# Patient Record
Sex: Female | Born: 1955
Health system: Southern US, Community
[De-identification: ages and names within clinical notes are randomized; demographics above are authoritative.]

## PROBLEM LIST (undated history)

## (undated) DIAGNOSIS — N289 Disorder of kidney and ureter, unspecified: Secondary | ICD-10-CM

## (undated) DIAGNOSIS — I428 Other cardiomyopathies: Secondary | ICD-10-CM

## (undated) DIAGNOSIS — I5022 Chronic systolic (congestive) heart failure: Secondary | ICD-10-CM

## (undated) DIAGNOSIS — D582 Other hemoglobinopathies: Secondary | ICD-10-CM

## (undated) DIAGNOSIS — J9600 Acute respiratory failure, unspecified whether with hypoxia or hypercapnia: Secondary | ICD-10-CM

## (undated) DIAGNOSIS — T148XXA Other injury of unspecified body region, initial encounter: Secondary | ICD-10-CM

## (undated) DIAGNOSIS — I10 Essential (primary) hypertension: Secondary | ICD-10-CM

## (undated) DIAGNOSIS — I340 Nonrheumatic mitral (valve) insufficiency: Secondary | ICD-10-CM

## (undated) DIAGNOSIS — K219 Gastro-esophageal reflux disease without esophagitis: Secondary | ICD-10-CM

## (undated) DIAGNOSIS — R7303 Prediabetes: Secondary | ICD-10-CM

## (undated) DIAGNOSIS — E785 Hyperlipidemia, unspecified: Secondary | ICD-10-CM

## (undated) DIAGNOSIS — I447 Left bundle-branch block, unspecified: Secondary | ICD-10-CM

## (undated) DIAGNOSIS — I071 Rheumatic tricuspid insufficiency: Secondary | ICD-10-CM

## (undated) HISTORY — DX: Other injury of unspecified body region, initial encounter: T14.8XXA

## (undated) HISTORY — DX: Other cardiomyopathies: I42.8

## (undated) HISTORY — DX: Other hemoglobinopathies: D58.2

## (undated) HISTORY — DX: Acute respiratory failure, unspecified whether with hypoxia or hypercapnia: J96.00

## (undated) HISTORY — DX: Disorder of kidney and ureter, unspecified: N28.9

## (undated) HISTORY — DX: Left bundle-branch block, unspecified: I44.7

## (undated) HISTORY — DX: Rheumatic tricuspid insufficiency: I07.1

## (undated) HISTORY — DX: Hyperlipidemia, unspecified: E78.5

## (undated) HISTORY — DX: Chronic systolic (congestive) heart failure: I50.22

## (undated) HISTORY — DX: Nonrheumatic mitral (valve) insufficiency: I34.0

## (undated) HISTORY — PX: TUBAL LIGATION: SHX77

---

## 2016-11-07 LAB — COLOGUARD: Cologuard: NEGATIVE

## 2018-10-19 DIAGNOSIS — K219 Gastro-esophageal reflux disease without esophagitis: Secondary | ICD-10-CM

## 2018-10-19 DIAGNOSIS — E66812 Obesity, class 2: Secondary | ICD-10-CM

## 2018-10-19 DIAGNOSIS — E785 Hyperlipidemia, unspecified: Secondary | ICD-10-CM | POA: Insufficient documentation

## 2018-10-19 HISTORY — DX: Gastro-esophageal reflux disease without esophagitis: K21.9

## 2018-10-19 HISTORY — DX: Obesity, class 2: E66.812

## 2018-10-19 HISTORY — DX: Morbid (severe) obesity due to excess calories: E66.01

## 2019-01-09 DIAGNOSIS — R140 Abdominal distension (gaseous): Secondary | ICD-10-CM

## 2019-01-09 DIAGNOSIS — R609 Edema, unspecified: Secondary | ICD-10-CM

## 2019-01-09 DIAGNOSIS — I10 Essential (primary) hypertension: Secondary | ICD-10-CM

## 2019-01-09 DIAGNOSIS — I351 Nonrheumatic aortic (valve) insufficiency: Secondary | ICD-10-CM

## 2019-01-09 DIAGNOSIS — E785 Hyperlipidemia, unspecified: Secondary | ICD-10-CM

## 2019-01-09 DIAGNOSIS — I361 Nonrheumatic tricuspid (valve) insufficiency: Secondary | ICD-10-CM

## 2019-01-09 DIAGNOSIS — I509 Heart failure, unspecified: Secondary | ICD-10-CM

## 2019-01-09 DIAGNOSIS — I340 Nonrheumatic mitral (valve) insufficiency: Secondary | ICD-10-CM

## 2019-01-10 DIAGNOSIS — I509 Heart failure, unspecified: Secondary | ICD-10-CM

## 2019-01-10 DIAGNOSIS — R140 Abdominal distension (gaseous): Secondary | ICD-10-CM | POA: Diagnosis not present

## 2019-01-10 DIAGNOSIS — E785 Hyperlipidemia, unspecified: Secondary | ICD-10-CM | POA: Diagnosis not present

## 2019-01-10 DIAGNOSIS — I10 Essential (primary) hypertension: Secondary | ICD-10-CM | POA: Diagnosis not present

## 2019-01-11 DIAGNOSIS — E785 Hyperlipidemia, unspecified: Secondary | ICD-10-CM | POA: Diagnosis not present

## 2019-01-11 DIAGNOSIS — I10 Essential (primary) hypertension: Secondary | ICD-10-CM | POA: Diagnosis not present

## 2019-01-11 DIAGNOSIS — I509 Heart failure, unspecified: Secondary | ICD-10-CM | POA: Diagnosis not present

## 2019-01-11 DIAGNOSIS — R140 Abdominal distension (gaseous): Secondary | ICD-10-CM | POA: Diagnosis not present

## 2019-01-12 ENCOUNTER — Inpatient Hospital Stay (HOSPITAL_COMMUNITY)
Admission: AD | Admit: 2019-01-12 | Discharge: 2019-01-16 | DRG: 286 | Disposition: A | Discharge status: Home / Self Care | Payer: BLUE CROSS/BLUE SHIELD | Attending: Internal Medicine | Admitting: Internal Medicine

## 2019-01-12 ENCOUNTER — Encounter (HOSPITAL_COMMUNITY): Payer: Self-pay | Admitting: Family Medicine

## 2019-01-12 DIAGNOSIS — I447 Left bundle-branch block, unspecified: Secondary | ICD-10-CM | POA: Diagnosis present

## 2019-01-12 DIAGNOSIS — E876 Hypokalemia: Secondary | ICD-10-CM | POA: Diagnosis present

## 2019-01-12 DIAGNOSIS — I110 Hypertensive heart disease with heart failure: Principal | ICD-10-CM | POA: Diagnosis present

## 2019-01-12 DIAGNOSIS — R7303 Prediabetes: Secondary | ICD-10-CM | POA: Diagnosis present

## 2019-01-12 DIAGNOSIS — I10 Essential (primary) hypertension: Secondary | ICD-10-CM

## 2019-01-12 DIAGNOSIS — I081 Rheumatic disorders of both mitral and tricuspid valves: Secondary | ICD-10-CM | POA: Diagnosis present

## 2019-01-12 DIAGNOSIS — E785 Hyperlipidemia, unspecified: Secondary | ICD-10-CM | POA: Diagnosis present

## 2019-01-12 DIAGNOSIS — I420 Dilated cardiomyopathy: Secondary | ICD-10-CM | POA: Diagnosis present

## 2019-01-12 DIAGNOSIS — I5021 Acute systolic (congestive) heart failure: Secondary | ICD-10-CM

## 2019-01-12 DIAGNOSIS — Z8249 Family history of ischemic heart disease and other diseases of the circulatory system: Secondary | ICD-10-CM

## 2019-01-12 DIAGNOSIS — Z1159 Encounter for screening for other viral diseases: Secondary | ICD-10-CM

## 2019-01-12 DIAGNOSIS — I509 Heart failure, unspecified: Secondary | ICD-10-CM | POA: Diagnosis not present

## 2019-01-12 DIAGNOSIS — K219 Gastro-esophageal reflux disease without esophagitis: Secondary | ICD-10-CM | POA: Diagnosis present

## 2019-01-12 DIAGNOSIS — N179 Acute kidney failure, unspecified: Secondary | ICD-10-CM | POA: Diagnosis present

## 2019-01-12 HISTORY — DX: Gastro-esophageal reflux disease without esophagitis: K21.9

## 2019-01-12 HISTORY — DX: Essential (primary) hypertension: I10

## 2019-01-12 HISTORY — DX: Acute systolic (congestive) heart failure: I50.21

## 2019-01-12 HISTORY — DX: Prediabetes: R73.03

## 2019-01-12 LAB — MRSA PCR SCREENING: MRSA by PCR: NEGATIVE

## 2019-01-12 LAB — GLUCOSE, CAPILLARY: Glucose-Capillary: 151 mg/dL — ABNORMAL HIGH (ref 70–99)

## 2019-01-12 MED ORDER — ATORVASTATIN CALCIUM 10 MG PO TABS
10.0000 mg | ORAL_TABLET | Freq: Every day | ORAL | Status: DC
  Administered 2019-01-13 – 2019-01-15 (×3): 10 mg via ORAL
  Filled 2019-01-12 (×4): qty 1

## 2019-01-12 MED ORDER — ENOXAPARIN SODIUM 40 MG/0.4ML ~~LOC~~ SOLN
40.0000 mg | SUBCUTANEOUS | Status: DC
  Administered 2019-01-13 – 2019-01-15 (×3): 40 mg via SUBCUTANEOUS
  Filled 2019-01-12 (×4): qty 0.4

## 2019-01-12 MED ORDER — ASPIRIN EC 81 MG PO TBEC
81.0000 mg | DELAYED_RELEASE_TABLET | Freq: Every day | ORAL | Status: DC
  Administered 2019-01-13: 81 mg via ORAL
  Filled 2019-01-12: qty 1

## 2019-01-12 MED ORDER — ONDANSETRON HCL 4 MG/2ML IJ SOLN
4.0000 mg | Freq: Four times a day (QID) | INTRAMUSCULAR | Status: DC | PRN
  Filled 2019-01-12: qty 2

## 2019-01-12 MED ORDER — CARVEDILOL 6.25 MG PO TABS
6.2500 mg | ORAL_TABLET | Freq: Two times a day (BID) | ORAL | Status: DC
  Administered 2019-01-13 – 2019-01-16 (×8): 6.25 mg via ORAL
  Filled 2019-01-12 (×9): qty 1

## 2019-01-12 MED ORDER — LOSARTAN POTASSIUM 25 MG PO TABS
25.0000 mg | ORAL_TABLET | Freq: Every day | ORAL | Status: DC
  Administered 2019-01-13 – 2019-01-16 (×4): 25 mg via ORAL
  Filled 2019-01-12 (×4): qty 1

## 2019-01-12 MED ORDER — CARVEDILOL 6.25 MG PO TABS: 6.2500 mg | ORAL_TABLET | Freq: Two times a day (BID) | ORAL | Status: DC

## 2019-01-12 MED ORDER — SODIUM CHLORIDE 0.9 % IV SOLN: 250.0000 mL | INTRAVENOUS | Status: DC | PRN

## 2019-01-12 MED ORDER — ACETAMINOPHEN 325 MG PO TABS
650.0000 mg | ORAL_TABLET | ORAL | Status: DC | PRN
  Administered 2019-01-13: 650 mg via ORAL
  Filled 2019-01-12: qty 2

## 2019-01-12 MED ORDER — FAMOTIDINE 20 MG PO TABS
40.0000 mg | ORAL_TABLET | Freq: Every day | ORAL | Status: DC
  Administered 2019-01-13 – 2019-01-16 (×4): 40 mg via ORAL
  Filled 2019-01-12 (×4): qty 2

## 2019-01-12 MED ORDER — SODIUM CHLORIDE 0.9% FLUSH
3.0000 mL | Freq: Two times a day (BID) | INTRAVENOUS | Status: DC
  Administered 2019-01-12 – 2019-01-13 (×2): 3 mL via INTRAVENOUS

## 2019-01-12 MED ORDER — FUROSEMIDE 10 MG/ML IJ SOLN
40.0000 mg | Freq: Two times a day (BID) | INTRAMUSCULAR | Status: DC
  Administered 2019-01-12 – 2019-01-15 (×6): 40 mg via INTRAVENOUS
  Filled 2019-01-12 (×6): qty 4

## 2019-01-12 MED ORDER — SODIUM CHLORIDE 0.9% FLUSH: 3.0000 mL | INTRAVENOUS | Status: DC | PRN

## 2019-01-12 NOTE — Progress Notes (Signed)
Pt arrived to unit. VS stable. MD paged. Awaiting orders. Will continue to monitor.

## 2019-01-12 NOTE — H&P (Signed)
History and Physical    Breanna Bennett ULA:[id number] DOB: 1956-05-08 DOA: 01/12/2019  PCP: Patient, No Pcp Per   Patient coming from: Home   Chief Complaint: SOB, swelling   HPI: Breanna Bennett is a 63 y.o. female with medical history significant for hypertension, GERD, hyperlipidemia, and prediabetes, who presented to the emergency department on 01/09/2019 with several months of progressive exertional dyspnea, orthopnea, bilateral lower extremity swelling, and abdominal distention.  Her shortness of breath has been much worse with any exertion.  She has had a mild nonproductive cough associated with this, but denies any fevers or chills.  Bilateral lower extremities have been swollen, but not tender.  She has not been having any chest pain.  [facility] ED and hospital course: Upon arrival to the ED, patient is found to be afebrile, saturating adequately on room air, and hypertensive.  Patient was admitted to the hospitalist service for new acute CHF, was seen by cardiology, underwent transthoracic echo that demonstrated EF 25 to 30% with severe LV dilation, and severe global hypokinesis.  She was continued on her ARB, started on Coreg, and diuresed with IV Lasix, net -3.2 L since admission.  Cardiologist at [facility] recommended transfer to [facility] for cardiac catheterization.  Review of Systems:  All other systems reviewed and apart from HPI, are negative.  Past Medical History:  Diagnosis Date  . GERD (gastroesophageal reflux disease)   . Hypertension   . Pre-diabetes     History reviewed. No pertinent surgical history.   reports that she has never smoked. She has never used smokeless tobacco. She reports that she does not drink alcohol or use drugs.  No Known Allergies  Family History  Problem Relation Age of Onset  . CAD Mother   . Heart failure Mother      Prior to Admission medications   Not on File    Physical Exam: Vitals:   01/12/19 2123  BP: (!)  130/102  Pulse: (!) 112  Resp: 15  Temp: 98.1 F (36.7 C)  TempSrc: Oral  SpO2: 97%  Weight: 106.8 kg  Height: 5\' 8"  (1.727 m)    Constitutional: NAD, calm  Eyes: PERTLA, lids and conjunctivae normal ENMT: Mucous membranes are moist. Posterior pharynx clear of any exudate or lesions.   Neck: normal, supple, no masses, no thyromegaly Respiratory: no wheezing, no crackles. No accessory muscle use.  Cardiovascular: Rate ~110 and regular. 1+ pretibial pitting edema bilaterally.   Abdomen: No distension, no tenderness, soft. Bowel sounds normal.  Musculoskeletal: no clubbing / cyanosis. No joint deformity upper and lower extremities.    Skin: no significant rashes, lesions, ulcers. Warm, dry, well-perfused. Neurologic: CN 2-12 grossly intact. Sensation intact. Strength 5/5 in all 4 limbs.  Psychiatric: Alert and oriented to person, place, and situation. Very pleasant, cooperative.    Labs on Admission: I have personally reviewed following labs and imaging studies  CBC: No results for input(s): WBC, NEUTROABS, HGB, HCT, MCV, PLT in the last 168 hours. Basic Metabolic Panel: No results for input(s): NA, K, CL, CO2, GLUCOSE, BUN, CREATININE, CALCIUM, MG, PHOS in the last 168 hours. GFR: CrCl cannot be calculated (No successful lab value found.). Liver Function Tests: No results for input(s): AST, ALT, ALKPHOS, BILITOT, PROT, ALBUMIN in the last 168 hours. No results for input(s): LIPASE, AMYLASE in the last 168 hours. No results for input(s): AMMONIA in the last 168 hours. Coagulation Profile: No results for input(s): INR, PROTIME in the last 168 hours. Cardiac Enzymes: No  results for input(s): CKTOTAL, CKMB, CKMBINDEX, TROPONINI in the last 168 hours. BNP (last 3 results) No results for input(s): PROBNP in the last 8760 hours. HbA1C: No results for input(s): HGBA1C in the last 72 hours. CBG: Recent Labs  Lab 01/12/19 2204  GLUCAP 151*   Lipid Profile: No results for  input(s): CHOL, HDL, LDLCALC, TRIG, CHOLHDL, LDLDIRECT in the last 72 hours. Thyroid Function Tests: No results for input(s): TSH, T4TOTAL, FREET4, T3FREE, THYROIDAB in the last 72 hours. Anemia Panel: No results for input(s): VITAMINB12, FOLATE, FERRITIN, TIBC, IRON, RETICCTPCT in the last 72 hours. Urine analysis: No results found for: COLORURINE, APPEARANCEUR, LABSPEC, PHURINE, GLUCOSEU, HGBUR, BILIRUBINUR, KETONESUR, PROTEINUR, UROBILINOGEN, NITRITE, LEUKOCYTESUR Sepsis Labs: @LABRCNTIP (procalcitonin:4,lacticidven:4) ) Recent Results (from the past 240 hour(s))  MRSA PCR Screening     Status: None   Collection Time: 01/12/19  9:26 PM  Result Value Ref Range Status   MRSA by PCR NEGATIVE NEGATIVE Final    Comment:        The GeneXpert MRSA Assay (FDA approved for NASAL specimens only), is one component of a comprehensive MRSA colonization surveillance program. It is not intended to diagnose MRSA infection nor to guide or monitor treatment for MRSA infections. Performed at [facility], [address] [place], [place] [address]      Radiological Exams on Admission: No results found.  EKG: Independently reviewed. Sinus tachycardia with fusion complexes and non-specific IVCD.   Assessment/Plan   1. Acute systolic CHF  - Presented 01/09/19 with months of progressive DOE, orthopnea, and b/l leg swelling, was found to have EF 25-30%, and has been undergoing diuresis, net -3.2 L at [facility]  - Patient reports near-resolution of her dyspnea and orthopnea with diuresis  - Continue to diurese with IV Lasix, continue Coreg and ARB, follow daily wt and I/O's, discuss with cardiology regarding recommendation for catheterization   2. Hypertension  - Continue Coreg, losartan, Lasix    3. Hyperlipidemia  - Continue Lipitor    PPE: Mask, face shield  DVT prophylaxis: Lovenox  Code Status: Full  Family Communication: Discussed with patient  Consults called: None   Admission status: Observation     Briscoe Deutscher, MD [facility] Pager [phone]  If 7PM-7AM, please contact night-coverage [email or web address] Password [facility]  01/12/2019, 10:54 PM

## 2019-01-13 ENCOUNTER — Encounter (HOSPITAL_COMMUNITY)
Admission: AD | Disposition: A | Discharge status: Home / Self Care | Payer: Self-pay | Source: Home / Self Care | Attending: Internal Medicine

## 2019-01-13 ENCOUNTER — Encounter (HOSPITAL_COMMUNITY): Payer: Self-pay

## 2019-01-13 ENCOUNTER — Other Ambulatory Visit: Payer: Self-pay

## 2019-01-13 DIAGNOSIS — I420 Dilated cardiomyopathy: Secondary | ICD-10-CM | POA: Diagnosis not present

## 2019-01-13 DIAGNOSIS — I5023 Acute on chronic systolic (congestive) heart failure: Secondary | ICD-10-CM | POA: Diagnosis not present

## 2019-01-13 DIAGNOSIS — I5021 Acute systolic (congestive) heart failure: Secondary | ICD-10-CM | POA: Diagnosis not present

## 2019-01-13 DIAGNOSIS — I10 Essential (primary) hypertension: Secondary | ICD-10-CM | POA: Diagnosis not present

## 2019-01-13 HISTORY — PX: RIGHT/LEFT HEART CATH AND CORONARY ANGIOGRAPHY: CATH118266

## 2019-01-13 LAB — GLUCOSE, CAPILLARY
Glucose-Capillary: 122 mg/dL — ABNORMAL HIGH (ref 70–99)
Glucose-Capillary: 114 mg/dL — ABNORMAL HIGH (ref 70–99)
Glucose-Capillary: 137 mg/dL — ABNORMAL HIGH (ref 70–99)
Glucose-Capillary: 132 mg/dL — ABNORMAL HIGH (ref 70–99)

## 2019-01-13 LAB — POCT I-STAT 7, (LYTES, BLD GAS, ICA,H+H)
Acid-Base Excess: 6 mmol/L — ABNORMAL HIGH (ref 0.0–2.0)
Acid-Base Excess: 8 mmol/L — ABNORMAL HIGH (ref 0.0–2.0)
Bicarbonate: 28.8 mmol/L — ABNORMAL HIGH (ref 20.0–28.0)
Bicarbonate: 29.3 mmol/L — ABNORMAL HIGH (ref 20.0–28.0)
Calcium, Ion: 1.07 mmol/L — ABNORMAL LOW (ref 1.15–1.40)
Calcium, Ion: 1.08 mmol/L — ABNORMAL LOW (ref 1.15–1.40)
HCT: 50 % — ABNORMAL HIGH (ref 36.0–46.0)
HCT: 51 % — ABNORMAL HIGH (ref 36.0–46.0)
Hemoglobin: 17 g/dL — ABNORMAL HIGH (ref 12.0–15.0)
Hemoglobin: 17.3 g/dL — ABNORMAL HIGH (ref 12.0–15.0)
O2 Saturation: 67 %
O2 Saturation: 99 %
Potassium: 3.3 mmol/L — ABNORMAL LOW (ref 3.5–5.1)
Potassium: 3.5 mmol/L (ref 3.5–5.1)
Sodium: 138 mmol/L (ref 135–145)
Sodium: 139 mmol/L (ref 135–145)
TCO2: 30 mmol/L (ref 22–32)
TCO2: 30 mmol/L (ref 22–32)
pCO2 arterial: 30.3 mmHg — ABNORMAL LOW (ref 32.0–48.0)
pCO2 arterial: 38.4 mmHg (ref 32.0–48.0)
pH, Arterial: 7.492 — ABNORMAL HIGH (ref 7.350–7.450)
pH, Arterial: 7.586 — ABNORMAL HIGH (ref 7.350–7.450)
pO2, Arterial: 105 mmHg (ref 83.0–108.0)
pO2, Arterial: 32 mmHg — CL (ref 83.0–108.0)

## 2019-01-13 LAB — POCT I-STAT EG7
Acid-Base Excess: 7 mmol/L — ABNORMAL HIGH (ref 0.0–2.0)
Bicarbonate: 30.7 mmol/L — ABNORMAL HIGH (ref 20.0–28.0)
Calcium, Ion: 1.17 mmol/L (ref 1.15–1.40)
HCT: 52 % — ABNORMAL HIGH (ref 36.0–46.0)
Hemoglobin: 17.7 g/dL — ABNORMAL HIGH (ref 12.0–15.0)
O2 Saturation: 65 %
Potassium: 3.5 mmol/L (ref 3.5–5.1)
Sodium: 140 mmol/L (ref 135–145)
TCO2: 32 mmol/L (ref 22–32)
pCO2, Ven: 39.5 mmHg — ABNORMAL LOW (ref 44.0–60.0)
pH, Ven: 7.499 — ABNORMAL HIGH (ref 7.250–7.430)
pO2, Ven: 31 mmHg — CL (ref 32.0–45.0)

## 2019-01-13 LAB — BASIC METABOLIC PANEL
Anion gap: 14 (ref 5–15)
BUN: 35 mg/dL — ABNORMAL HIGH (ref 8–23)
CO2: 27 mmol/L (ref 22–32)
Calcium: 9.4 mg/dL (ref 8.9–10.3)
Chloride: 97 mmol/L — ABNORMAL LOW (ref 98–111)
Creatinine, Ser: 1.39 mg/dL — ABNORMAL HIGH (ref 0.44–1.00)
GFR calc Af Amer: 47 mL/min — ABNORMAL LOW (ref >60)
GFR calc non Af Amer: 41 mL/min — ABNORMAL LOW (ref >60)
Glucose, Bld: 134 mg/dL — ABNORMAL HIGH (ref 70–99)
Potassium: 3.4 mmol/L — ABNORMAL LOW (ref 3.5–5.1)
Sodium: 138 mmol/L (ref 135–145)

## 2019-01-13 LAB — CBC WITH DIFFERENTIAL/PLATELET
Abs Immature Granulocytes: 0.04 10*3/uL (ref 0.00–0.07)
Basophils Absolute: 0.1 10*3/uL (ref 0.0–0.1)
Basophils Relative: 1 %
Eosinophils Absolute: 0.1 10*3/uL (ref 0.0–0.5)
Eosinophils Relative: 1 %
HCT: 51.9 % — ABNORMAL HIGH (ref 36.0–46.0)
Hemoglobin: 17.3 g/dL — ABNORMAL HIGH (ref 12.0–15.0)
Immature Granulocytes: 0 %
Lymphocytes Relative: 31 %
Lymphs Abs: 3.1 10*3/uL (ref 0.7–4.0)
MCH: 27.7 pg (ref 26.0–34.0)
MCHC: 33.3 g/dL (ref 30.0–36.0)
MCV: 83.2 fL (ref 80.0–100.0)
Monocytes Absolute: 0.9 10*3/uL (ref 0.1–1.0)
Monocytes Relative: 9 %
Neutro Abs: 5.7 10*3/uL (ref 1.7–7.7)
Neutrophils Relative %: 58 %
Platelets: 305 10*3/uL (ref 150–400)
RBC: 6.24 MIL/uL — ABNORMAL HIGH (ref 3.87–5.11)
RDW: 15.2 % (ref 11.5–15.5)
WBC: 9.9 10*3/uL (ref 4.0–10.5)
nRBC: 0 % (ref 0.0–0.2)

## 2019-01-13 LAB — COMPREHENSIVE METABOLIC PANEL
ALT: 66 U/L — ABNORMAL HIGH (ref 0–44)
AST: 41 U/L (ref 15–41)
Albumin: 3.7 g/dL (ref 3.5–5.0)
Alkaline Phosphatase: 77 U/L (ref 38–126)
Anion gap: 13 (ref 5–15)
BUN: 35 mg/dL — ABNORMAL HIGH (ref 8–23)
CO2: 28 mmol/L (ref 22–32)
Calcium: 9.6 mg/dL (ref 8.9–10.3)
Chloride: 98 mmol/L (ref 98–111)
Creatinine, Ser: 1.32 mg/dL — ABNORMAL HIGH (ref 0.44–1.00)
GFR calc Af Amer: 50 mL/min — ABNORMAL LOW (ref >60)
GFR calc non Af Amer: 43 mL/min — ABNORMAL LOW (ref >60)
Glucose, Bld: 123 mg/dL — ABNORMAL HIGH (ref 70–99)
Potassium: 3 mmol/L — ABNORMAL LOW (ref 3.5–5.1)
Sodium: 139 mmol/L (ref 135–145)
Total Bilirubin: 1.1 mg/dL (ref 0.3–1.2)
Total Protein: 7.9 g/dL (ref 6.5–8.1)

## 2019-01-13 LAB — HIV ANTIBODY (ROUTINE TESTING W REFLEX): HIV Screen 4th Generation wRfx: NONREACTIVE

## 2019-01-13 LAB — MAGNESIUM: Magnesium: 2 mg/dL (ref 1.7–2.4)

## 2019-01-13 LAB — TROPONIN I
Troponin I: 0.29 ng/mL (ref <0.03)
Troponin I: 0.36 ng/mL (ref <0.03)
Troponin I: 0.37 ng/mL (ref <0.03)

## 2019-01-13 LAB — SARS CORONAVIRUS 2 BY RT PCR (HOSPITAL ORDER, PERFORMED IN ~~LOC~~ HOSPITAL LAB): SARS Coronavirus 2: NEGATIVE

## 2019-01-13 SURGERY — RIGHT/LEFT HEART CATH AND CORONARY ANGIOGRAPHY
Anesthesia: LOCAL

## 2019-01-13 MED ORDER — SODIUM CHLORIDE 0.9 % IV SOLN
INTRAVENOUS | Status: DC
  Administered 2019-01-13: 13:00:00 via INTRAVENOUS

## 2019-01-13 MED ORDER — FENTANYL CITRATE (PF) 100 MCG/2ML IJ SOLN
INTRAMUSCULAR | Status: DC | PRN
  Administered 2019-01-13: 25 ug via INTRAVENOUS

## 2019-01-13 MED ORDER — ASPIRIN 81 MG PO CHEW
81.0000 mg | CHEWABLE_TABLET | Freq: Every day | ORAL | Status: DC
  Administered 2019-01-14 – 2019-01-16 (×3): 81 mg via ORAL
  Filled 2019-01-13 (×3): qty 1

## 2019-01-13 MED ORDER — GUAIFENESIN-DM 100-10 MG/5ML PO SYRP
15.0000 mL | ORAL_SOLUTION | ORAL | Status: DC | PRN
  Administered 2019-01-13 – 2019-01-14 (×2): 15 mL via ORAL
  Filled 2019-01-13 (×2): qty 15

## 2019-01-13 MED ORDER — LIDOCAINE HCL (PF) 1 % IJ SOLN
INTRAMUSCULAR | Status: DC | PRN
  Administered 2019-01-13 (×2): 2 mL

## 2019-01-13 MED ORDER — SODIUM CHLORIDE 0.9 % IV SOLN
INTRAVENOUS | Status: AC
  Administered 2019-01-13: 16:00:00 via INTRAVENOUS

## 2019-01-13 MED ORDER — POTASSIUM CHLORIDE CRYS ER 20 MEQ PO TBCR
40.0000 meq | EXTENDED_RELEASE_TABLET | Freq: Once | ORAL | Status: AC
  Administered 2019-01-13: 16:00:00 40 meq via ORAL
  Filled 2019-01-13: qty 2

## 2019-01-13 MED ORDER — ONDANSETRON HCL 4 MG/2ML IJ SOLN: 4.0000 mg | Freq: Four times a day (QID) | INTRAMUSCULAR | Status: DC | PRN

## 2019-01-13 MED ORDER — HEPARIN (PORCINE) IN NACL 1000-0.9 UT/500ML-% IV SOLN
INTRAVENOUS | Status: AC
  Filled 2019-01-13: qty 1000

## 2019-01-13 MED ORDER — HEPARIN SODIUM (PORCINE) 1000 UNIT/ML IJ SOLN
INTRAMUSCULAR | Status: AC
  Filled 2019-01-13: qty 1

## 2019-01-13 MED ORDER — LIDOCAINE HCL (PF) 1 % IJ SOLN
INTRAMUSCULAR | Status: AC
  Filled 2019-01-13: qty 30

## 2019-01-13 MED ORDER — LABETALOL HCL 5 MG/ML IV SOLN: 10.0000 mg | INTRAVENOUS | Status: AC | PRN

## 2019-01-13 MED ORDER — HEPARIN (PORCINE) IN NACL 1000-0.9 UT/500ML-% IV SOLN
INTRAVENOUS | Status: DC | PRN
  Administered 2019-01-13 (×2): 500 mL

## 2019-01-13 MED ORDER — ACETAMINOPHEN 325 MG PO TABS
650.0000 mg | ORAL_TABLET | ORAL | Status: DC | PRN
  Administered 2019-01-13 – 2019-01-14 (×2): 650 mg via ORAL
  Filled 2019-01-13 (×2): qty 2

## 2019-01-13 MED ORDER — SODIUM CHLORIDE 0.9% FLUSH: 3.0000 mL | INTRAVENOUS | Status: DC | PRN

## 2019-01-13 MED ORDER — VERAPAMIL HCL 2.5 MG/ML IV SOLN
INTRAVENOUS | Status: AC
  Filled 2019-01-13: qty 2

## 2019-01-13 MED ORDER — FENTANYL CITRATE (PF) 100 MCG/2ML IJ SOLN
INTRAMUSCULAR | Status: AC
  Filled 2019-01-13: qty 2

## 2019-01-13 MED ORDER — SODIUM CHLORIDE 0.9 % IV SOLN: 250.0000 mL | INTRAVENOUS | Status: DC | PRN

## 2019-01-13 MED ORDER — HYDRALAZINE HCL 20 MG/ML IJ SOLN: 10.0000 mg | INTRAMUSCULAR | Status: AC | PRN

## 2019-01-13 MED ORDER — IOHEXOL 350 MG/ML SOLN
INTRAVENOUS | Status: DC | PRN
  Administered 2019-01-13: 35 mL

## 2019-01-13 MED ORDER — HEPARIN SODIUM (PORCINE) 1000 UNIT/ML IJ SOLN
INTRAMUSCULAR | Status: DC | PRN
  Administered 2019-01-13: 5000 [IU] via INTRAVENOUS

## 2019-01-13 MED ORDER — VERAPAMIL HCL 2.5 MG/ML IV SOLN
INTRAVENOUS | Status: DC | PRN
  Administered 2019-01-13: 10 mL via INTRA_ARTERIAL

## 2019-01-13 MED ORDER — SODIUM CHLORIDE 0.9% FLUSH
3.0000 mL | Freq: Two times a day (BID) | INTRAVENOUS | Status: DC
  Administered 2019-01-13 – 2019-01-15 (×4): 3 mL via INTRAVENOUS

## 2019-01-13 MED ORDER — MORPHINE SULFATE (PF) 2 MG/ML IV SOLN: 2.0000 mg | INTRAVENOUS | Status: DC | PRN

## 2019-01-13 MED ORDER — POTASSIUM CHLORIDE CRYS ER 20 MEQ PO TBCR
40.0000 meq | EXTENDED_RELEASE_TABLET | Freq: Once | ORAL | Status: AC
  Administered 2019-01-13: 40 meq via ORAL
  Filled 2019-01-13: qty 2

## 2019-01-13 SURGICAL SUPPLY — 12 items
CATH BALLN WEDGE 5F 110CM (CATHETERS) ×2 IMPLANT
CATH OPTITORQUE TIG 4.0 5F (CATHETERS) ×2 IMPLANT
GLIDESHEATH SLEND A-KIT 6F 22G (SHEATH) ×2 IMPLANT
GUIDEWIRE .025 260CM (WIRE) ×2 IMPLANT
GUIDEWIRE INQWIRE 1.5J.035X260 (WIRE) ×1 IMPLANT
INQWIRE 1.5J .035X260CM (WIRE) ×2
KIT HEART LEFT (KITS) ×2 IMPLANT
PACK CARDIAC CATHETERIZATION (CUSTOM PROCEDURE TRAY) ×2 IMPLANT
SHEATH GLIDE SLENDER 4/5FR (SHEATH) ×2 IMPLANT
TRANSDUCER W/STOPCOCK (MISCELLANEOUS) ×2 IMPLANT
TUBING CIL FLEX 10 FLL-RA (TUBING) ×2 IMPLANT
WIRE HI TORQ VERSACORE-J 145CM (WIRE) ×2 IMPLANT

## 2019-01-13 NOTE — Interval H&P Note (Signed)
Cath Lab Visit (complete for each Cath Lab visit)  Clinical Evaluation Leading to the Procedure:   ACS: No.  Non-ACS:    Anginal Classification: CCS I  Anti-ischemic medical therapy: No Therapy  Non-Invasive Test Results: No non-invasive testing performed  Prior CABG: No previous CABG      History and Physical Interval Note:  01/13/2019 2:12 PM  Breanna Bennett  has presented today for surgery, with the diagnosis of chest pain.  The various methods of treatment have been discussed with the patient and family. After consideration of risks, benefits and other options for treatment, the patient has consented to  Procedure(s): RIGHT/LEFT HEART CATH AND CORONARY ANGIOGRAPHY (N/A) as a surgical intervention.  The patient's history has been reviewed, patient examined, no change in status, stable for surgery.  I have reviewed the patient's chart and labs.  Questions were answered to the patient's satisfaction.     Nanetta Batty

## 2019-01-13 NOTE — Progress Notes (Addendum)
Pt ambulated to the bathroom with assistance . On return c/o dizziness, vital signs done , Hr 103,. Tele monitor showed Hr 103 possible sinus tachycardia , Dr Blake Divine informed, Ekg done and assessed, for cardio consult. Awaiting same.  Pt now in sinus rhythmn. GL87  .Care continues.

## 2019-01-13 NOTE — H&P (View-Only) (Signed)
Cardiology Consultation:   Patient ID: Breanna Bennett MRN: [id number]; DOB: Jan 30, 1956  Admit date: 01/12/2019 Date of Consult: 01/13/2019  Primary Care Provider: Patient, No Pcp Per Primary Cardiologist: No primary care provider on file.  Primary Electrophysiologist:  None    Patient Profile:   Breanna Bennett is a 63 y.o. female with a hx of HTN, GERD, HL and prediabetes who is being seen today for the evaluation of HF at the request of Dr. Blake Divine.  History of Present Illness:   Breanna Bennett is a 63 yo female with PMH noted above. Denies having been seen by a cardiologist in the past. Does have family hx of CAD with mother and sister having MI and CHF before the age of 33. Reported feeling unwell over the past couple of weeks. Developed abd bloating, shortness of breath, and lower extremity edema. Had also been experiencing exertional dyspnea for the past several months. 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 Also reporting orthopnea and has been sitting up in a chair at night to sleep.   Presented to [facility] on 01/09/19 with symptoms. Labs on admission were notable for proBNP >6000, elevated LFTs. CT abd in the ED with cardiomegaly and vascular congestion. She was admitted to [facility] for further management. EKG on admission showed SR with LBBB. COVID negative on admission. She was placed on IV lasix, with home medications of ASA, statin and cozaar continued. Echo there on 01/10/19 showed EF of 25-30% with severely dilated LV, moderate to severe MR, and moderate to severe TR. Cardiology at [facility] consulted and recommended transfer to [facility] for cardiac cath. She was placed on coreg and titrated to 6.25mg  BID prior to transfer. Of note troponin at [facility] with low flat trend of 0.02. LFTs were improving at the time of transfer.   At present her labs show mild hypokalemia, but other lytes stable, Cr 1.39  (slight above PTA Cr at [facility] was 1.1), Hgb 17.3, Trop 0.29. EKG noted SR with LBBB.   Past Medical History:  Diagnosis Date  . GERD (gastroesophageal reflux disease)   . Hypertension   . Pre-diabetes     History reviewed. No pertinent surgical history.   Home Medications:  Prior to Admission medications   Not on File    Inpatient Medications: Scheduled Meds: . aspirin EC  81 mg Oral Daily  . atorvastatin  10 mg Oral q1800  . carvedilol  6.25 mg Oral BID WC  . enoxaparin (LOVENOX) injection  40 mg Subcutaneous Q24H  . famotidine  40 mg Oral Daily  . furosemide  40 mg Intravenous Q12H  . losartan  25 mg Oral Daily  . sodium chloride flush  3 mL Intravenous Q12H   Continuous Infusions: . sodium chloride     PRN Meds: sodium chloride, acetaminophen, ondansetron (ZOFRAN) IV, sodium chloride flush  Allergies:   No Known Allergies  Social History:   Social History   Socioeconomic History  . Marital status: Unknown    Spouse name: Not on file  . Number of children: Not on file  . Years of education: Not on file  . Highest education level: Not on file  Occupational History  . Not on file  Social Needs  . Financial resource strain: Not on file  . Food insecurity:    Worry: Not on file    Inability: Not on file  . Transportation needs:    Medical:  Not on file    Non-medical: Not on file  Tobacco Use  . Smoking status: Never Smoker  . Smokeless tobacco: Never Used  Substance and Sexual Activity  . Alcohol use: Never    Frequency: Never  . Drug use: Never  . Sexual activity: Not on file  Lifestyle  . Physical activity:    Days per week: Not on file    Minutes per session: Not on file  . Stress: Not on file  Relationships  . Social connections:    Talks on phone: Not on file    Gets together: Not on file    Attends religious service: Not on file    Active member of club or organization: Not on file    Attends meetings of clubs or organizations: Not on  file    Relationship status: Not on file  . Intimate partner violence:    Fear of current or ex partner: Not on file    Emotionally abused: Not on file    Physically abused: Not on file    Forced sexual activity: Not on file  Other Topics Concern  . Not on file  Social History Narrative  . Not on file    Family History:    Family History  Problem Relation Age of Onset  . CAD Mother   . Heart failure Mother      ROS:  Please see the history of present illness.   All other ROS reviewed and negative.     Physical Exam/Data:   Vitals:   01/13/19 0400 01/13/19 0500 01/13/19 0656 01/13/19 0724  BP: 103/78  97/77 100/70  Pulse: 87  100 81  Resp: (!) 21   18  Temp: 98.1 F (36.7 C)   (!) 97.5 F (36.4 C)  TempSrc: Oral   Oral  SpO2: 99%   99%  Weight:  106.2 kg    Height:        Intake/Output Summary (Last 24 hours) at 01/13/2019 1022 Last data filed at 01/13/2019 0524 Gross per 24 hour  Intake 243 ml  Output 400 ml  Net -157 ml   Last 3 Weights 01/13/2019 01/12/2019  Weight (lbs) 234 lb 2.1 oz 235 lb 7.2 oz  Weight (kg) 106.2 kg 106.8 kg     Body mass index is 35.6 kg/m.  General:  Well nourished, well developed, in no acute distress HEENT: normal Lymph: no adenopathy Neck: no JVD Endocrine:  No thryomegaly Vascular: No carotid bruits; FA pulses 2+ bilaterally without bruits  Cardiac:  normal S1, S2; RRR; no murmur Lungs:  clear to auscultation bilaterally, no wheezing, rhonchi or rales  Abd: soft, nontender, no hepatomegaly  Ext: no edema Musculoskeletal:  No deformities, BUE and BLE strength normal and equal Skin: warm and dry  Neuro:  CNs 2-12 intact, no focal abnormalities noted Psych:  Normal affect   EKG:  The EKG was personally reviewed and demonstrates:  SR with LBBB Telemetry: Reviewed and demonstrates: appears to have had bursts of afib/aflutter this morning around 9am, now in SR.   Relevant CV Studies:  TTE (in paper chart) EF 25%   Laboratory Data:  Chemistry Recent Labs  Lab 01/13/19 0238 01/13/19 0919  NA 139 138  K 3.0* 3.4*  CL 98 97*  CO2 28 27  GLUCOSE 123* 134*  BUN 35* 35*  CREATININE 1.32* 1.39*  CALCIUM 9.6 9.4  GFRNONAA 43* 41*  GFRAA 50* 47*  ANIONGAP 13 14    Recent Labs  Lab 01/13/19 0238  PROT 7.9  ALBUMIN 3.7  AST 41  ALT 66*  ALKPHOS 77  BILITOT 1.1   Hematology Recent Labs  Lab 01/13/19 0238  WBC 9.9  RBC 6.24*  HGB 17.3*  HCT 51.9*  MCV 83.2  MCH 27.7  MCHC 33.3  RDW 15.2  PLT 305   Cardiac Enzymes Recent Labs  Lab 01/13/19 0919  TROPONINI 0.29*   No results for input(s): TROPIPOC in the last 168 hours.  BNPNo results for input(s): BNP, PROBNP in the last 168 hours.  DDimer No results for input(s): DDIMER in the last 168 hours.  Radiology/Studies:  No results found.  Assessment and Plan:   Breanna Bennett is a 63 y.o. female with a hx of HTN, GERD, HL and prediabetes who is being seen today for the evaluation of HF at the request of Dr. Blake Divine.  1. New onset systolic HF: no reported chest pain prior to admission, though does has CRFs of HTN, HL and prediabetic. Also with + FH in mother and sister. EF at [facility] 25% with global hypokinesis. Troponin 0.02 while at [place], up to 0.29 here. Will ultimately need R/LHC to assess for underlying CAD as etiology of HF. Review timing with MD. -- medical therapy includes coreg, and ARB. Consider transition to [facility] post cath.   2. HTN: stable with current therapy  3. Possible aflutter/fib: run noted on telemetry and seemed symptomatic at the time while walking to the bathroom. Was dizzy with episode. Currently in SR with LBBB.   4. HL: on low dose on statin. Will further increase to high dose pending cath results.   5. Hypokalemia: supplement per primary  For questions or updates, please contact [facility] Please consult [email or web address] for contact info under   Signed, Laverda Page, NP  01/13/2019  10:22 AM   I have personally seen and examined this patient with Laverda Page, NP. I agree with the assessment and plan as outlined above. Breanna Bennett has a h/o HTN and HLD. Admitted to [facility] with c/o abdominal swelling and LE edema with mild dyspnea and found to be volume overloaded. Diuresed there and now feeling better. Echo at [facility] with LVEF=25%. She is on Coreg and ARB. No chest pain.  Labs reviewed by me.  Troponin 0.29.  Creat 1.39.  EKG with sinus and LBBB Tele with possible atrial flutter My exam: General: Well developed, well nourished, NAD  HEENT: OP clear, mucus membranes moist  SKIN: warm, dry. No rashes. Neuro: No focal deficits  Musculoskeletal: Muscle strength 5/5 all ext  Psychiatric: Mood and affect normal  Neck: No JVD, no carotid bruits, no thyromegaly, no lymphadenopathy.  Lungs:Clear bilaterally, no wheezes, rhonci, crackles Cardiovascular: Regular rate and rhythm. No murmurs, gallops or rubs. Abdomen:Soft. Bowel sounds present. Non-tender.  Extremities: No lower extremity edema. Pulses are 2 + in the bilateral DP/PT. Plan:  1. Cardiomyopathy 2. Elevated troponin 3. Presumed LBBB 4. Possible atrial flutter  Plan right and left heart cath today. Following cath will titrate medical therapy. Follow on telemetry and if more atrial flutter, will have to consider long term anticoagulation.   Verne Carrow 01/13/2019 12:21 PM

## 2019-01-13 NOTE — Progress Notes (Signed)
CRITICAL VALUE ALERT  Critical Value:  tROP-0.29   Date & Time Notied: 5/21/ 20 @11 :17  Provider Notified:  Dr Blake Divine  Orders Received/Actions taken: none  Awaiting cardio review.

## 2019-01-13 NOTE — Progress Notes (Signed)
PROGRESS NOTE    Kaianna Nagengast  XTA:[id number] DOB: 05/07/1956 DOA: 01/12/2019 PCP: Patient, No Pcp Per   Brief Narrative:   Rhaelyn Hambleton is a 63 y.o. female with medical history significant for hypertension, GERD, hyperlipidemia, and prediabetes, who presented to the emergency department on 01/09/2019 with several months of progressive exertional dyspnea, orthopnea, bilateral lower extremity swelling, and abdominal distention. She was admitted for CHF at [facility] hospital and transferred to [facility] for cardiac cath.   She was seen by cardiology, underwent transthoracic echo that demonstrated EF 25 to 30% with severe LV dilation, and severe global hypokinesis.   Cardiologist at [facility] recommended transfer to [facility] for cardiac catheterization.  Assessment & Plan:   Principal Problem:   Acute systolic CHF (congestive heart failure) (HCC) Active Problems:   Essential hypertension   Acute systolic heart failure  _ diuresed about 5 liters since admission asp er the patient.  - continue with IV lasix, coreg and cardiology consulted for cath today.  Further recommendations as per cardiology.    Essential hypertension:  Well controlled.    Elevated troponin's; Probably from demand ischemia.  Pt denies any chest pain.    ? Atrial flutter:  ? New LBBB Will need EP evaluation in the future.  Currently in SR on coreg.     Hyokalemia: Replaced.      DVT prophylaxis: lovenox.  Code Status:full code.  Family Communication: none at bedside.  Disposition Plan: pending further work up by cardiology.    Consultants:   Cardiology.    Procedures: cardiac catheterization.   Antimicrobials: none.   Subjective: Some dizziness earlier today .  No chest pain , sob improved.   Objective: Vitals:   01/13/19 0656 01/13/19 0724 01/13/19 0850 01/13/19 1232  BP: 97/77 100/70 (!) 106/47 100/83  Pulse: 100 81 (!) 103   Resp:  18    Temp:  (!) 97.5 F (36.4 C)  97.8 F  (36.6 C)  TempSrc:  Oral  Oral  SpO2:  99%    Weight:      Height:        Intake/Output Summary (Last 24 hours) at 01/13/2019 1313 Last data filed at 01/13/2019 1306 Gross per 24 hour  Intake 243 ml  Output 701 ml  Net -458 ml   Filed Weights   01/12/19 2123 01/13/19 0500  Weight: 106.8 kg 106.2 kg    Examination:  General exam: Appears calm and comfortable  Respiratory system: Clear to auscultation. Respiratory effort normal. Cardiovascular system: S1 & S2 heard, RRR. Mild pedal edema. Gastrointestinal system: Abdomen is nondistended, soft and nontender. No organomegaly or masses felt. Normal bowel sounds heard. Central nervous system: Alert and oriented. No focal neurological deficits. Extremities: Symmetric 5 x 5 power. Skin: No rashes, lesions or ulcers Psychiatry: Judgement and insight appear normal. Mood & affect appropriate.     Data Reviewed: I have personally reviewed following labs and imaging studies  CBC: Recent Labs  Lab 01/13/19 0238  WBC 9.9  NEUTROABS 5.7  HGB 17.3*  HCT 51.9*  MCV 83.2  PLT 305   Basic Metabolic Panel: Recent Labs  Lab 01/13/19 0238 01/13/19 0919  NA 139 138  K 3.0* 3.4*  CL 98 97*  CO2 28 27  GLUCOSE 123* 134*  BUN 35* 35*  CREATININE 1.32* 1.39*  CALCIUM 9.6 9.4  MG 2.0  --    GFR: Estimated Creatinine Clearance: 53.5 mL/min (A) (by C-G formula based on SCr of 1.39 mg/dL (H)). Liver Function  Tests: Recent Labs  Lab 01/13/19 0238  AST 41  ALT 66*  ALKPHOS 77  BILITOT 1.1  PROT 7.9  ALBUMIN 3.7   No results for input(s): LIPASE, AMYLASE in the last 168 hours. No results for input(s): AMMONIA in the last 168 hours. Coagulation Profile: No results for input(s): INR, PROTIME in the last 168 hours. Cardiac Enzymes: Recent Labs  Lab 01/13/19 0919  TROPONINI 0.29*   BNP (last 3 results) No results for input(s): PROBNP in the last 8760 hours. HbA1C: No results for input(s): HGBA1C in the last 72 hours.  CBG: Recent Labs  Lab 01/12/19 2204 01/13/19 0728 01/13/19 1106  GLUCAP 151* 122* 114*   Lipid Profile: No results for input(s): CHOL, HDL, LDLCALC, TRIG, CHOLHDL, LDLDIRECT in the last 72 hours. Thyroid Function Tests: No results for input(s): TSH, T4TOTAL, FREET4, T3FREE, THYROIDAB in the last 72 hours. Anemia Panel: No results for input(s): VITAMINB12, FOLATE, FERRITIN, TIBC, IRON, RETICCTPCT in the last 72 hours. Sepsis Labs: No results for input(s): PROCALCITON, LATICACIDVEN in the last 168 hours.  Recent Results (from the past 240 hour(s))  MRSA PCR Screening     Status: None   Collection Time: 01/12/19  9:26 PM  Result Value Ref Range Status   MRSA by PCR NEGATIVE NEGATIVE Final    Comment:        The GeneXpert MRSA Assay (FDA approved for NASAL specimens only), is one component of a comprehensive MRSA colonization surveillance program. It is not intended to diagnose MRSA infection nor to guide or monitor treatment for MRSA infections. Performed at [facility], [address] [place], [place] [address]   SARS Coronavirus 2 (CEPHEID - Performed in [facility] hospital lab), Hosp Order     Status: None   Collection Time: 01/12/19 11:37 PM  Result Value Ref Range Status   SARS Coronavirus 2 NEGATIVE NEGATIVE Final    Comment: (NOTE) If result is NEGATIVE SARS-CoV-2 target nucleic acids are NOT DETECTED. The SARS-CoV-2 RNA is generally detectable in upper and lower  respiratory specimens during the acute phase of infection. The lowest  concentration of SARS-CoV-2 viral copies this assay can detect is 250  copies / mL. A negative result does not preclude SARS-CoV-2 infection  and should not be used as the sole basis for treatment or other  patient management decisions.  A negative result may occur with  improper specimen collection / handling, submission of specimen other  than nasopharyngeal swab, presence of viral mutation(s) within the  areas targeted by  this assay, and inadequate number of viral copies  (<250 copies / mL). A negative result must be combined with clinical  observations, patient history, and epidemiological information. If result is POSITIVE SARS-CoV-2 target nucleic acids are DETECTED. The SARS-CoV-2 RNA is generally detectable in upper and lower  respiratory specimens dur ing the acute phase of infection.  Positive  results are indicative of active infection with SARS-CoV-2.  Clinical  correlation with patient history and other diagnostic information is  necessary to determine patient infection status.  Positive results do  not rule out bacterial infection or co-infection with other viruses. If result is PRESUMPTIVE POSTIVE SARS-CoV-2 nucleic acids MAY BE PRESENT.   A presumptive positive result was obtained on the submitted specimen  and confirmed on repeat testing.  While 2019 novel coronavirus  (SARS-CoV-2) nucleic acids may be present in the submitted sample  additional confirmatory testing may be necessary for epidemiological  and / or clinical management purposes  to differentiate between  SARS-CoV-2 and other Sarbecovirus currently known to infect humans.  If clinically indicated additional testing with an alternate test  methodology [phone]) is advised. The SARS-CoV-2 RNA is generally  detectable in upper and lower respiratory sp ecimens during the acute  phase of infection. The expected result is Negative. Fact Sheet for Patients:  BoilerBrush.com.cy Fact Sheet for Healthcare Providers: https://pope.com/ This test is not yet approved or cleared by the [place] FDA and has been authorized for detection and/or diagnosis of SARS-CoV-2 by FDA under an Emergency Use Authorization (EUA).  This EUA will remain in effect (meaning this test can be used) for the duration of the COVID-19 declaration under Section 564(b)(1) of the Act, 21 U.S.C. section  360bbb-3(b)(1), unless the authorization is terminated or revoked sooner. Performed at [facility], [address], [place], [place] [address]          Radiology Studies: No results found.      Scheduled Meds: . aspirin EC  81 mg Oral Daily  . atorvastatin  10 mg Oral q1800  . carvedilol  6.25 mg Oral BID WC  . enoxaparin (LOVENOX) injection  40 mg Subcutaneous Q24H  . famotidine  40 mg Oral Daily  . furosemide  40 mg Intravenous Q12H  . losartan  25 mg Oral Daily  . sodium chloride flush  3 mL Intravenous Q12H   Continuous Infusions: . sodium chloride    . sodium chloride       LOS: 1 day    Time spent: 37 minutes.     Kathlen Mody, MD [facility] Pager [phone]  If 7PM-7AM, please contact night-coverage [email or web address] Password [email or web address] 01/13/2019, 1:13 PM

## 2019-01-13 NOTE — Consult Note (Signed)
Cardiology Consultation:   Patient ID: Breanna Bennett MRN: [id number]; DOB: 03/29/1956  Admit date: 01/12/2019 Date of Consult: 01/13/2019  Primary Care Provider: Patient, No Pcp Per Primary Cardiologist: No primary care provider on file.  Primary Electrophysiologist:  None    Patient Profile:   Breanna Bennett is a 62 y.o. female with a hx of HTN, GERD, HL and prediabetes who is being seen today for the evaluation of HF at the request of Dr. Akula.  History of Present Illness:   Breanna Bennett is a 62 yo female with PMH noted above. Denies having been seen by a cardiologist in the past. Does have family hx of CAD with mother and sister having MI and CHF before the age of 60. Reported feeling unwell over the past couple of weeks. Developed abd bloating, shortness of breath, and lower extremity edema. Had also been experiencing exertional dyspnea for the past several months. 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 Also reporting orthopnea and has been sitting up in a chair at night to sleep.   Presented to [place] hospital on 01/09/19 with symptoms. Labs on admission were notable for proBNP >6000, elevated LFTs. CT abd in the ED with cardiomegaly and vascular congestion. She was admitted to [facility] for further management. EKG on admission showed SR with LBBB. COVID negative on admission. She was placed on IV lasix, with home medications of ASA, statin and cozaar continued. Echo there on 01/10/19 showed EF of 25-30% with severely dilated LV, moderate to severe MR, and moderate to severe TR. Cardiology at [place] consulted and recommended transfer to [facility] for cardiac cath. She was placed on coreg and titrated to 6.25mg BID prior to transfer. Of note troponin at [place] with low flat trend of 0.02. LFTs were improving at the time of transfer.   At present her labs show mild hypokalemia, but other lytes stable, Cr 1.39  (slight above PTA Cr at [place] was 1.1), Hgb 17.3, Trop 0.29. EKG noted SR with LBBB.   Past Medical History:  Diagnosis Date  . GERD (gastroesophageal reflux disease)   . Hypertension   . Pre-diabetes     History reviewed. No pertinent surgical history.   Home Medications:  Prior to Admission medications   Not on File    Inpatient Medications: Scheduled Meds: . aspirin EC  81 mg Oral Daily  . atorvastatin  10 mg Oral q1800  . carvedilol  6.25 mg Oral BID WC  . enoxaparin (LOVENOX) injection  40 mg Subcutaneous Q24H  . famotidine  40 mg Oral Daily  . furosemide  40 mg Intravenous Q12H  . losartan  25 mg Oral Daily  . sodium chloride flush  3 mL Intravenous Q12H   Continuous Infusions: . sodium chloride     PRN Meds: sodium chloride, acetaminophen, ondansetron (ZOFRAN) IV, sodium chloride flush  Allergies:   No Known Allergies  Social History:   Social History   Socioeconomic History  . Marital status: Unknown    Spouse name: Not on file  . Number of children: Not on file  . Years of education: Not on file  . Highest education level: Not on file  Occupational History  . Not on file  Social Needs  . Financial resource strain: Not on file  . Food insecurity:    Worry: Not on file    Inability: Not on file  . Transportation needs:    Medical:   Not on file    Non-medical: Not on file  Tobacco Use  . Smoking status: Never Smoker  . Smokeless tobacco: Never Used  Substance and Sexual Activity  . Alcohol use: Never    Frequency: Never  . Drug use: Never  . Sexual activity: Not on file  Lifestyle  . Physical activity:    Days per week: Not on file    Minutes per session: Not on file  . Stress: Not on file  Relationships  . Social connections:    Talks on phone: Not on file    Gets together: Not on file    Attends religious service: Not on file    Active member of club or organization: Not on file    Attends meetings of clubs or organizations: Not on  file    Relationship status: Not on file  . Intimate partner violence:    Fear of current or ex partner: Not on file    Emotionally abused: Not on file    Physically abused: Not on file    Forced sexual activity: Not on file  Other Topics Concern  . Not on file  Social History Narrative  . Not on file    Family History:    Family History  Problem Relation Age of Onset  . CAD Mother   . Heart failure Mother      ROS:  Please see the history of present illness.   All other ROS reviewed and negative.     Physical Exam/Data:   Vitals:   01/13/19 0400 01/13/19 0500 01/13/19 0656 01/13/19 0724  BP: 103/78  97/77 100/70  Pulse: 87  100 81  Resp: (!) 21   18  Temp: 98.1 F (36.7 C)   (!) 97.5 F (36.4 C)  TempSrc: Oral   Oral  SpO2: 99%   99%  Weight:  106.2 kg    Height:        Intake/Output Summary (Last 24 hours) at 01/13/2019 1022 Last data filed at 01/13/2019 0524 Gross per 24 hour  Intake 243 ml  Output 400 ml  Net -157 ml   Last 3 Weights 01/13/2019 01/12/2019  Weight (lbs) 234 lb 2.1 oz 235 lb 7.2 oz  Weight (kg) 106.2 kg 106.8 kg     Body mass index is 35.6 kg/m.  General:  Well nourished, well developed, in no acute distress HEENT: normal Lymph: no adenopathy Neck: no JVD Endocrine:  No thryomegaly Vascular: No carotid bruits; FA pulses 2+ bilaterally without bruits  Cardiac:  normal S1, S2; RRR; no murmur Lungs:  clear to auscultation bilaterally, no wheezing, rhonchi or rales  Abd: soft, nontender, no hepatomegaly  Ext: no edema Musculoskeletal:  No deformities, BUE and BLE strength normal and equal Skin: warm and dry  Neuro:  CNs 2-12 intact, no focal abnormalities noted Psych:  Normal affect   EKG:  The EKG was personally reviewed and demonstrates:  SR with LBBB Telemetry: Reviewed and demonstrates: appears to have had bursts of afib/aflutter this morning around 9am, now in SR.   Relevant CV Studies:  TTE (in paper chart) EF 25%   Laboratory Data:  Chemistry Recent Labs  Lab 01/13/19 0238 01/13/19 0919  NA 139 138  K 3.0* 3.4*  CL 98 97*  CO2 28 27  GLUCOSE 123* 134*  BUN 35* 35*  CREATININE 1.32* 1.39*  CALCIUM 9.6 9.4  GFRNONAA 43* 41*  GFRAA 50* 47*  ANIONGAP 13 14    Recent Labs  Lab 01/13/19 0238  PROT 7.9  ALBUMIN 3.7  AST 41  ALT 66*  ALKPHOS 77  BILITOT 1.1   Hematology Recent Labs  Lab 01/13/19 0238  WBC 9.9  RBC 6.24*  HGB 17.3*  HCT 51.9*  MCV 83.2  MCH 27.7  MCHC 33.3  RDW 15.2  PLT 305   Cardiac Enzymes Recent Labs  Lab 01/13/19 0919  TROPONINI 0.29*   No results for input(s): TROPIPOC in the last 168 hours.  BNPNo results for input(s): BNP, PROBNP in the last 168 hours.  DDimer No results for input(s): DDIMER in the last 168 hours.  Radiology/Studies:  No results found.  Assessment and Plan:   Breanna Bennett is a 63 y.o. female with a hx of HTN, GERD, HL and prediabetes who is being seen today for the evaluation of HF at the request of Dr. Blake Divine.  1. New onset systolic HF: no reported chest pain prior to admission, though does has CRFs of HTN, HL and prediabetic. Also with + FH in mother and sister. EF at [facility] 25% with global hypokinesis. Troponin 0.02 while at [place], up to 0.29 here. Will ultimately need R/LHC to assess for underlying CAD as etiology of HF. Review timing with MD. -- medical therapy includes coreg, and ARB. Consider transition to [facility] post cath.   2. HTN: stable with current therapy  3. Possible aflutter/fib: run noted on telemetry and seemed symptomatic at the time while walking to the bathroom. Was dizzy with episode. Currently in SR with LBBB.   4. HL: on low dose on statin. Will further increase to high dose pending cath results.   5. Hypokalemia: supplement per primary  For questions or updates, please contact [facility] Please consult [email or web address] for contact info under   Signed, Laverda Page, NP  01/13/2019  10:22 AM   I have personally seen and examined this patient with Laverda Page, NP. I agree with the assessment and plan as outlined above. Breanna Bennett has a h/o HTN and HLD. Admitted to [facility] with c/o abdominal swelling and LE edema with mild dyspnea and found to be volume overloaded. Diuresed there and now feeling better. Echo at [facility] with LVEF=25%. She is on Coreg and ARB. No chest pain.  Labs reviewed by me.  Troponin 0.29.  Creat 1.39.  EKG with sinus and LBBB Tele with possible atrial flutter My exam: General: Well developed, well nourished, NAD  HEENT: OP clear, mucus membranes moist  SKIN: warm, dry. No rashes. Neuro: No focal deficits  Musculoskeletal: Muscle strength 5/5 all ext  Psychiatric: Mood and affect normal  Neck: No JVD, no carotid bruits, no thyromegaly, no lymphadenopathy.  Lungs:Clear bilaterally, no wheezes, rhonci, crackles Cardiovascular: Regular rate and rhythm. No murmurs, gallops or rubs. Abdomen:Soft. Bowel sounds present. Non-tender.  Extremities: No lower extremity edema. Pulses are 2 + in the bilateral DP/PT. Plan:  1. Cardiomyopathy 2. Elevated troponin 3. Presumed LBBB 4. Possible atrial flutter  Plan right and left heart cath today. Following cath will titrate medical therapy. Follow on telemetry and if more atrial flutter, will have to consider long term anticoagulation.   Verne Carrow 01/13/2019 12:21 PM

## 2019-01-14 ENCOUNTER — Encounter (HOSPITAL_COMMUNITY): Payer: Self-pay | Admitting: Cardiovascular Disease

## 2019-01-14 DIAGNOSIS — I428 Other cardiomyopathies: Secondary | ICD-10-CM | POA: Diagnosis not present

## 2019-01-14 DIAGNOSIS — I5021 Acute systolic (congestive) heart failure: Secondary | ICD-10-CM | POA: Diagnosis not present

## 2019-01-14 LAB — BASIC METABOLIC PANEL
Anion gap: 13 (ref 5–15)
BUN: 35 mg/dL — ABNORMAL HIGH (ref 8–23)
CO2: 29 mmol/L (ref 22–32)
Calcium: 9.1 mg/dL (ref 8.9–10.3)
Chloride: 97 mmol/L — ABNORMAL LOW (ref 98–111)
Creatinine, Ser: 1.41 mg/dL — ABNORMAL HIGH (ref 0.44–1.00)
GFR calc Af Amer: 46 mL/min — ABNORMAL LOW (ref >60)
GFR calc non Af Amer: 40 mL/min — ABNORMAL LOW (ref >60)
Glucose, Bld: 116 mg/dL — ABNORMAL HIGH (ref 70–99)
Potassium: 3.6 mmol/L (ref 3.5–5.1)
Sodium: 139 mmol/L (ref 135–145)

## 2019-01-14 LAB — CBC
HCT: 49.8 % — ABNORMAL HIGH (ref 36.0–46.0)
Hemoglobin: 16.6 g/dL — ABNORMAL HIGH (ref 12.0–15.0)
MCH: 27.7 pg (ref 26.0–34.0)
MCHC: 33.3 g/dL (ref 30.0–36.0)
MCV: 83.1 fL (ref 80.0–100.0)
Platelets: 285 10*3/uL (ref 150–400)
RBC: 5.99 MIL/uL — ABNORMAL HIGH (ref 3.87–5.11)
RDW: 14.9 % (ref 11.5–15.5)
WBC: 8.8 10*3/uL (ref 4.0–10.5)
nRBC: 0 % (ref 0.0–0.2)

## 2019-01-14 LAB — GLUCOSE, CAPILLARY
Glucose-Capillary: 106 mg/dL — ABNORMAL HIGH (ref 70–99)
Glucose-Capillary: 179 mg/dL — ABNORMAL HIGH (ref 70–99)
Glucose-Capillary: 101 mg/dL — ABNORMAL HIGH (ref 70–99)
Glucose-Capillary: 117 mg/dL — ABNORMAL HIGH (ref 70–99)

## 2019-01-14 MED ORDER — POTASSIUM CHLORIDE CRYS ER 20 MEQ PO TBCR
40.0000 meq | EXTENDED_RELEASE_TABLET | Freq: Every day | ORAL | Status: DC
  Administered 2019-01-14 – 2019-01-16 (×3): 40 meq via ORAL
  Filled 2019-01-14 (×3): qty 2

## 2019-01-14 MED ORDER — LIVING BETTER WITH HEART FAILURE BOOK
Freq: Once | Status: AC
  Administered 2019-01-14: 10:00:00

## 2019-01-14 NOTE — TOC Initial Note (Signed)
Transition of Care [facility]) - Initial/Assessment Note    Patient Details  Name: Breanna Bennett MRN: [id number] Date of Birth: 11-01-1955  Transition of Care [facility]) CM/SW Contact:    Cherylann Parr, RN Phone Number: 01/14/2019, 1:45 PM  Clinical Narrative:     PTA Independent from home with husband, pt has PCP and denied barriers with paying for prescription meds.  Pt educated on importance of daily weights and low sodium diet.  Bedside nurse ambulated pt in hall and deemed PT eval appropriate - CM ordered per protocol              Expected Discharge Plan: Home/Self Care Barriers to Discharge: Continued Medical Work up   Patient Goals and CMS Choice        Expected Discharge Plan and Services Expected Discharge Plan: Home/Self Care       Living arrangements for the past 2 months: Single Family Home                                      Prior Living Arrangements/Services Living arrangements for the past 2 months: Single Family Home Lives with:: Spouse   Do you feel safe going back to the place where you live?: Yes      Need for Family Participation in Patient Care: No (Comment) Care giver support system in place?: Yes (comment)(husband and adult child)      Activities of Daily Living Home Assistive Devices/Equipment: None ADL Screening (condition at time of admission) Patient's cognitive ability adequate to safely complete daily activities?: Yes Is the patient deaf or have difficulty hearing?: No Does the patient have difficulty seeing, even when wearing glasses/contacts?: No Does the patient have difficulty concentrating, remembering, or making decisions?: No Patient able to express need for assistance with ADLs?: Yes Does the patient have difficulty dressing or bathing?: No Independently performs ADLs?: Yes (appropriate for developmental age) Does the patient have difficulty walking or climbing stairs?: No Weakness of Legs: None Weakness of Arms/Hands:  None  Permission Sought/Granted                  Emotional Assessment   Attitude/Demeanor/Rapport: Engaged, Self-Confident Affect (typically observed): Accepting, Adaptable Orientation: : Oriented to Self, Oriented to Place, Oriented to  Time, Oriented to Situation   Psych Involvement: No (comment)  Admission diagnosis:  HEART FAILURE Patient Active Problem List   Diagnosis Date Noted  . Acute systolic CHF (congestive heart failure) (HCC) 01/12/2019  . Essential hypertension 01/12/2019   PCP:  Patient, No Pcp Per Pharmacy:   [id number] Rosalita Levan, [place] - [address] AT [facility] [address] [place] [address] Phone: [phone] Fax: [phone]     Social Determinants of Health (SDOH) Interventions    Readmission Risk Interventions No flowsheet data found.

## 2019-01-14 NOTE — Progress Notes (Signed)
PROGRESS NOTE    Breanna Bennett  ZJI:[id number] DOB: May 04, 1956 DOA: 01/12/2019 PCP: Patient, No Pcp Per   Brief Narrative:   Breanna Bennett is a 63 y.o. female with medical history significant for hypertension, GERD, hyperlipidemia, and prediabetes, who presented to the emergency department on 01/09/2019 with several months of progressive exertional dyspnea, orthopnea, bilateral lower extremity swelling, and abdominal distention. She was admitted for CHF at [facility] hospital and transferred to [facility] for cardiac cath.   She was seen by cardiology, underwent transthoracic echo that demonstrated EF 25 to 30% with severe LV dilation, and severe global hypokinesis.   Cardiologist at [facility] recommended transfer to [facility] for cardiac catheterization.  Assessment & Plan:   Principal Problem:   Acute systolic CHF (congestive heart failure) (HCC) Active Problems:   Essential hypertension   Acute systolic heart failure  Improving.  - continue with IV lasix 40 mg BID , coreg 6.25 mg BID, cozaar 25 mg daily.  - add potassium supplementation daily.  - cardiology consulted and she underwent cardiac cath on 5/21, showed dilated non ischemic cardiomyopathy .  - resume aspirin 81 mg daily and lipitor 10 mg daily.    Essential hypertension:  Well controlled.    Elevated troponin's; Probably from demand ischemia.  Pt denies any chest pain.    ? Atrial flutter: New LBBB Will need EP evaluation in the future.  Currently in SR on coreg.     Hyokalemia: Replaced.      DVT prophylaxis: lovenox.  Code Status:full code.  Family Communication: none at bedside.  Disposition Plan: possible d/c home in 1 to 2 days.    Consultants:   Cardiology.    Procedures: cardiac catheterization.   Antimicrobials: none.   Subjective: No chest pain or sob, feels much better.   Objective: Vitals:   01/14/19 0652 01/14/19 0726 01/14/19 0924 01/14/19 1105  BP:  101/62 101/81 105/84   Pulse: (!) 106  99 (!) 104  Resp: 19   (!) 22  Temp:  97.9 F (36.6 C)  97.7 F (36.5 C)  TempSrc:  Oral  Oral  SpO2: (!) 87%   98%  Weight:      Height:        Intake/Output Summary (Last 24 hours) at 01/14/2019 1315 Last data filed at 01/14/2019 1106 Gross per 24 hour  Intake 213.44 ml  Output 702 ml  Net -488.56 ml   Filed Weights   01/12/19 2123 01/13/19 0500 01/14/19 0304  Weight: 106.8 kg 106.2 kg 105.6 kg    Examination:  General exam: Appears calm and comfortable  Respiratory system: Clear to auscultation. Respiratory effort normal. No wheezing or rhonchi.  Cardiovascular system: S1 & S2 heard, RRR. Mild pedal edema. Gastrointestinal system: Abdomen is nondistended, soft and nontender. No organomegaly or masses felt. Normal bowel sounds heard. Central nervous system: Alert and oriented. No focal neurological deficits. Extremities: Symmetric 5 x 5 power. Skin: No rashes, lesions or ulcers Psychiatry: Judgement and insight appear normal. Mood & affect appropriate.     Data Reviewed: I have personally reviewed following labs and imaging studies  CBC: Recent Labs  Lab 01/13/19 0238 01/13/19 1457 01/13/19 1458 01/13/19 1502 01/14/19 0235  WBC 9.9  --   --   --  8.8  NEUTROABS 5.7  --   --   --   --   HGB 17.3* 17.0* 17.7* 17.3* 16.6*  HCT 51.9* 50.0* 52.0* 51.0* 49.8*  MCV 83.2  --   --   --  83.1  PLT 305  --   --   --  285   Basic Metabolic Panel: Recent Labs  Lab 01/13/19 0238 01/13/19 0919 01/13/19 1457 01/13/19 1458 01/13/19 1502 01/14/19 0235  NA 139 138 139 140 138 139  K 3.0* 3.4* 3.3* 3.5 3.5 3.6  CL 98 97*  --   --   --  97*  CO2 28 27  --   --   --  29  GLUCOSE 123* 134*  --   --   --  116*  BUN 35* 35*  --   --   --  35*  CREATININE 1.32* 1.39*  --   --   --  1.41*  CALCIUM 9.6 9.4  --   --   --  9.1  MG 2.0  --   --   --   --   --    GFR: Estimated Creatinine Clearance: 52.6 mL/min (A) (by C-G formula based on SCr of 1.41 mg/dL  (H)). Liver Function Tests: Recent Labs  Lab 01/13/19 0238  AST 41  ALT 66*  ALKPHOS 77  BILITOT 1.1  PROT 7.9  ALBUMIN 3.7   No results for input(s): LIPASE, AMYLASE in the last 168 hours. No results for input(s): AMMONIA in the last 168 hours. Coagulation Profile: No results for input(s): INR, PROTIME in the last 168 hours. Cardiac Enzymes: Recent Labs  Lab 01/13/19 0919 01/13/19 1544 01/13/19 2040  TROPONINI 0.29* 0.36* 0.37*   BNP (last 3 results) No results for input(s): PROBNP in the last 8760 hours. HbA1C: No results for input(s): HGBA1C in the last 72 hours. CBG: Recent Labs  Lab 01/13/19 1106 01/13/19 1630 01/13/19 2146 01/14/19 0627 01/14/19 1037  GLUCAP 114* 137* 132* 106* 179*   Lipid Profile: No results for input(s): CHOL, HDL, LDLCALC, TRIG, CHOLHDL, LDLDIRECT in the last 72 hours. Thyroid Function Tests: No results for input(s): TSH, T4TOTAL, FREET4, T3FREE, THYROIDAB in the last 72 hours. Anemia Panel: No results for input(s): VITAMINB12, FOLATE, FERRITIN, TIBC, IRON, RETICCTPCT in the last 72 hours. Sepsis Labs: No results for input(s): PROCALCITON, LATICACIDVEN in the last 168 hours.  Recent Results (from the past 240 hour(s))  MRSA PCR Screening     Status: None   Collection Time: 01/12/19  9:26 PM  Result Value Ref Range Status   MRSA by PCR NEGATIVE NEGATIVE Final    Comment:        The GeneXpert MRSA Assay (FDA approved for NASAL specimens only), is one component of a comprehensive MRSA colonization surveillance program. It is not intended to diagnose MRSA infection nor to guide or monitor treatment for MRSA infections. Performed at [facility], [address], [place], [place] [address]   SARS Coronavirus 2 (CEPHEID - Performed in [facility] hospital lab), Hosp Order     Status: None   Collection Time: 01/12/19 11:37 PM  Result Value Ref Range Status   SARS Coronavirus 2 NEGATIVE NEGATIVE Final    Comment: (NOTE) If  result is NEGATIVE SARS-CoV-2 target nucleic acids are NOT DETECTED. The SARS-CoV-2 RNA is generally detectable in upper and lower  respiratory specimens during the acute phase of infection. The lowest  concentration of SARS-CoV-2 viral copies this assay can detect is 250  copies / mL. A negative result does not preclude SARS-CoV-2 infection  and should not be used as the sole basis for treatment or other  patient management decisions.  A negative result may occur with  improper  specimen collection / handling, submission of specimen other  than nasopharyngeal swab, presence of viral mutation(s) within the  areas targeted by this assay, and inadequate number of viral copies  (<250 copies / mL). A negative result must be combined with clinical  observations, patient history, and epidemiological information. If result is POSITIVE SARS-CoV-2 target nucleic acids are DETECTED. The SARS-CoV-2 RNA is generally detectable in upper and lower  respiratory specimens dur ing the acute phase of infection.  Positive  results are indicative of active infection with SARS-CoV-2.  Clinical  correlation with patient history and other diagnostic information is  necessary to determine patient infection status.  Positive results do  not rule out bacterial infection or co-infection with other viruses. If result is PRESUMPTIVE POSTIVE SARS-CoV-2 nucleic acids MAY BE PRESENT.   A presumptive positive result was obtained on the submitted specimen  and confirmed on repeat testing.  While 2019 novel coronavirus  (SARS-CoV-2) nucleic acids may be present in the submitted sample  additional confirmatory testing may be necessary for epidemiological  and / or clinical management purposes  to differentiate between  SARS-CoV-2 and other Sarbecovirus currently known to infect humans.  If clinically indicated additional testing with an alternate test  methodology [phone] is advised. The SARS-CoV-2 RNA is generally   detectable in upper and lower respiratory sp ecimens during the acute  phase of infection. The expected result is Negative. Fact Sheet for Patients:  BoilerBrush.com.cyhttps://www.fda.gov/media/136312/download Fact Sheet for Healthcare Providers: https://pope.com/https://www.fda.gov/media/136313/download This test is not yet approved or cleared by the [place] FDA and has been authorized for detection and/or diagnosis of SARS-CoV-2 by FDA under an Emergency Use Authorization (EUA).  This EUA will remain in effect (meaning this test can be used) for the duration of the COVID-19 declaration under Section 564(b)(1) of the Act, 21 U.S.C. section 360bbb-3(b)(1), unless the authorization is terminated or revoked sooner. Performed at [facility], [address] [place], [place] [address]          Radiology Studies: No results found.      Scheduled Meds: . aspirin  81 mg Oral Daily  . atorvastatin  10 mg Oral q1800  . carvedilol  6.25 mg Oral BID WC  . enoxaparin (LOVENOX) injection  40 mg Subcutaneous Q24H  . famotidine  40 mg Oral Daily  . furosemide  40 mg Intravenous Q12H  . losartan  25 mg Oral Daily  . potassium chloride  40 mEq Oral Daily  . sodium chloride flush  3 mL Intravenous Q12H   Continuous Infusions: . sodium chloride       LOS: 2 days    Time spent: 38 minutes.     Kathlen ModyVijaya Pheonix Wisby, MD [facility] Pager [phone]  If 7PM-7AM, please contact night-coverage [email or web address] Password [email or web address] 01/14/2019, 1:15 PM

## 2019-01-14 NOTE — Progress Notes (Addendum)
Progress Note  Patient Name: Breanna Bennett Date of Encounter: 01/14/2019  Primary Cardiologist: No primary care provider on file. - will need OP f/u around [facility]  Subjective   APP virtual pre-round to reduce number of exposures to patient during Covid pandemic - patient reports feeling much better today. Has been ambulating with less dyspnea. No CP. Edema improving per her report. She does report having been sick about 3-4 weeks before this.  Inpatient Medications    Scheduled Meds: . aspirin  81 mg Oral Daily  . atorvastatin  10 mg Oral q1800  . carvedilol  6.25 mg Oral BID WC  . enoxaparin (LOVENOX) injection  40 mg Subcutaneous Q24H  . famotidine  40 mg Oral Daily  . furosemide  40 mg Intravenous Q12H  . losartan  25 mg Oral Daily  . sodium chloride flush  3 mL Intravenous Q12H   Continuous Infusions: . sodium chloride     PRN Meds: sodium chloride, acetaminophen, guaiFENesin-dextromethorphan, morphine injection, ondansetron (ZOFRAN) IV, sodium chloride flush   Vital Signs    Vitals:   01/14/19 0014 01/14/19 0304 01/14/19 0652 01/14/19 0726  BP: 94/75 114/85  101/62  Pulse: 99 85 (!) 106   Resp: Temp: (!) 97.4 F (36.3 C) (!) 97.4 F (36.3 C)  97.9 F (36.6 C)  TempSrc: Oral Oral  Oral  SpO2: 98% 98% (!) 87%   Weight:  105.6 kg    Height:        Intake/Output Summary (Last 24 hours) at 01/14/2019 0839 Last data filed at 01/14/2019 2956 Gross per 24 hour  Intake 213.44 ml  Output 703 ml  Net -489.56 ml   Last 3 Weights 01/14/2019 01/13/2019 01/12/2019  Weight (lbs) 232 lb 12.9 oz 234 lb 2.1 oz 235 lb 7.2 oz  Weight (kg) 105.6 kg 106.2 kg 106.8 kg     Telemetry   Pending MD review  Physical Exam   Pending MD review  Labs    Chemistry Recent Labs  Lab 01/13/19 0238 01/13/19 0919  01/13/19 1458 01/13/19 1502 01/14/19 0235  NA 139 138   < > 140 138 139  K 3.0* 3.4*   < > 3.5 3.5 3.6  CL 98 97*  --   --   --  97*  CO2 28  27  --   --   --  29  GLUCOSE 123* 134*  --   --   --  116*  BUN 35* 35*  --   --   --  35*  CREATININE 1.32* 1.39*  --   --   --  1.41*  CALCIUM 9.6 9.4  --   --   --  9.1  PROT 7.9  --   --   --   --   --   ALBUMIN 3.7  --   --   --   --   --   AST 41  --   --   --   --   --   ALT 66*  --   --   --   --   --   ALKPHOS 77  --   --   --   --   --   BILITOT 1.1  --   --   --   --   --   GFRNONAA 43* 41*  --   --   --  40*  GFRAA 50* 47*  --   --   --  46*  ANIONGAP 13 14  --   --   --  13   < > = values in this interval not displayed.     Hematology Recent Labs  Lab 01/13/19 0238  01/13/19 1458 01/13/19 1502 01/14/19 0235  WBC 9.9  --   --   --  8.8  RBC 6.24*  --   --   --  5.99*  HGB 17.3*   < > 17.7* 17.3* 16.6*  HCT 51.9*   < > 52.0* 51.0* 49.8*  MCV 83.2  --   --   --  83.1  MCH 27.7  --   --   --  27.7  MCHC 33.3  --   --   --  33.3  RDW 15.2  --   --   --  14.9  PLT 305  --   --   --  285   < > = values in this interval not displayed.    Cardiac Enzymes Recent Labs  Lab 01/13/19 0919 01/13/19 1544 01/13/19 2040  TROPONINI 0.29* 0.36* 0.37*   No results for input(s): TROPIPOC in the last 168 hours.    Radiology    No results found.  Cardiac Studies   Per Consult note, [facility]  Echo there on 01/10/19 showed EF of 25-30% with severely dilated LV, moderate to severe MR, and moderate to severe TR.  Patient Profile     63 y.o. female with HTN, GERD, HLD, obesity, pre-DM presented with several-week hx of abdominal distention, SOB, edema. Found to have LVEF 25-30%, moderate-severe MR, moderate-severe TR. EKG with NSR with LBBB. Brief episode of possible atrial flutter noted on telemetry. R/LHC felt to represent NICM, LVEDP .  Assessment & Plan    1. Acute systolic CHF - etiology of LV dysfunction unknown. Cath without CAD per Dr. Gibson Ramp review. No ETOH or drug use. ? Viral etiology - was sick several weeks ago with URI, and also persistent cough  earlier this year. Will add thyroid function to AM labs. May need cMRI at some point. Her BP prohibits further aggressive med titration at present time, including conversion from losartan to [facility]. LVEDP was 32 by cath. Plan physical assessment by MD - patient reports she is feeling much better. In anticipation of continuing Lasix for another day of diuresis, will add KCl daily. Based on her report though she may be nearing ability to transition to oral form.. Once euvolemic would recommend to transition some of her Lasix to spironolactone. Need to follow renal parameters. Change diet from regular to low sodium/fluid restricted and rx CHF book. Reviewed 2g sodium restriction, 2L fluid restriction, daily weights with patient.  2. Mitral regurgitation and tricuspid regurgitation - will ask MD to review cath to determine if any other management needed at present time for this.  3. Acute kidney injury - per [facility], historical Cr has been normal (0.91 in 05/2018). Unclear what acute bump represents, could be cardiorenal. Continue to monitor in context of diuretic.  4. Hyperlipidemia - LFTs were elevated at OSH but not too bad here. Continue present dose.  5. ?Atrial flutter - pending MD review of telemetry. Can consider OP event monitor as well - if this was happening as OP, could have contributed to cardiomyopathy.  For questions or updates, please contact [facility] Please consult [email or web address] for contact info under Cardiology/STEMI.  Signed, Laurann Montana, PA-C 01/14/2019, 8:39 AM    I have personally seen and examined this patient. I  agree with the assessment and plan as outlined above. She feels much better. Dyspnea resolved. No pain. Tele with sinus tach. Labs reviewed by me. Cath films reviewed by me. No CAD.  She has a non-ischemic CM. Based on filling pressures by cath, will continue IV Lasix today. By exam she is not volume overloaded. Anticipate conversion to po Lasix tomorrow.   Continue beta blocker and ARB. BP will not allow conversion of ARB to Entresto at this time.  Will attempt medical therapy for now for cardiomyopathy. She will be followed in our [place] office after discharge.   Verne Carrow 01/14/2019 9:12 AM

## 2019-01-15 DIAGNOSIS — I428 Other cardiomyopathies: Secondary | ICD-10-CM | POA: Diagnosis not present

## 2019-01-15 DIAGNOSIS — I5021 Acute systolic (congestive) heart failure: Secondary | ICD-10-CM | POA: Diagnosis not present

## 2019-01-15 LAB — GLUCOSE, CAPILLARY
Glucose-Capillary: 100 mg/dL — ABNORMAL HIGH (ref 70–99)
Glucose-Capillary: 120 mg/dL — ABNORMAL HIGH (ref 70–99)
Glucose-Capillary: 123 mg/dL — ABNORMAL HIGH (ref 70–99)
Glucose-Capillary: 138 mg/dL — ABNORMAL HIGH (ref 70–99)

## 2019-01-15 LAB — BASIC METABOLIC PANEL
Anion gap: 13 (ref 5–15)
BUN: 33 mg/dL — ABNORMAL HIGH (ref 8–23)
CO2: 28 mmol/L (ref 22–32)
Calcium: 9.3 mg/dL (ref 8.9–10.3)
Chloride: 98 mmol/L (ref 98–111)
Creatinine, Ser: 1.14 mg/dL — ABNORMAL HIGH (ref 0.44–1.00)
GFR calc Af Amer: 60 mL/min — ABNORMAL LOW (ref >60)
GFR calc non Af Amer: 51 mL/min — ABNORMAL LOW (ref >60)
Glucose, Bld: 120 mg/dL — ABNORMAL HIGH (ref 70–99)
Potassium: 3.6 mmol/L (ref 3.5–5.1)
Sodium: 139 mmol/L (ref 135–145)

## 2019-01-15 LAB — HEPATIC FUNCTION PANEL
ALT: 47 U/L — ABNORMAL HIGH (ref 0–44)
AST: 29 U/L (ref 15–41)
Albumin: 3.4 g/dL — ABNORMAL LOW (ref 3.5–5.0)
Alkaline Phosphatase: 70 U/L (ref 38–126)
Bilirubin, Direct: 0.1 mg/dL (ref 0.0–0.2)
Indirect Bilirubin: 0.7 mg/dL (ref 0.3–0.9)
Total Bilirubin: 0.8 mg/dL (ref 0.3–1.2)
Total Protein: 8 g/dL (ref 6.5–8.1)

## 2019-01-15 LAB — LIPID PANEL
Cholesterol: 200 mg/dL (ref 0–200)
HDL: 42 mg/dL (ref >40)
LDL Cholesterol: 143 mg/dL — ABNORMAL HIGH (ref 0–99)
Total CHOL/HDL Ratio: 4.8 RATIO
Triglycerides: 74 mg/dL (ref <150)
VLDL: 15 mg/dL (ref 0–40)

## 2019-01-15 LAB — T4, FREE: Free T4: 1.6 ng/dL (ref 0.82–1.77)

## 2019-01-15 LAB — TSH: TSH: 0.993 u[IU]/mL (ref 0.350–4.500)

## 2019-01-15 MED ORDER — FUROSEMIDE 10 MG/ML IJ SOLN: 40.0000 mg | Freq: Two times a day (BID) | INTRAMUSCULAR | Status: DC

## 2019-01-15 MED ORDER — FUROSEMIDE 40 MG PO TABS
40.0000 mg | ORAL_TABLET | Freq: Every day | ORAL | Status: DC
  Administered 2019-01-16: 40 mg via ORAL
  Filled 2019-01-15: qty 1

## 2019-01-15 NOTE — Progress Notes (Signed)
Progress Note  Patient Name: Breanna Bennett Date of Encounter: 01/15/2019  Primary Cardiologist:   No primary care provider on file.   Subjective   She is breathing better.  No acute distress.  She is light headed when she stands.    Inpatient Medications    Scheduled Meds: . aspirin  81 mg Oral Daily  . atorvastatin  10 mg Oral q1800  . carvedilol  6.25 mg Oral BID WC  . enoxaparin (LOVENOX) injection  40 mg Subcutaneous Q24H  . famotidine  40 mg Oral Daily  . furosemide  40 mg Intravenous Q12H  . losartan  25 mg Oral Daily  . potassium chloride  40 mEq Oral Daily  . sodium chloride flush  3 mL Intravenous Q12H   Continuous Infusions: . sodium chloride     PRN Meds: sodium chloride, acetaminophen, guaiFENesin-dextromethorphan, morphine injection, ondansetron (ZOFRAN) IV, sodium chloride flush   Vital Signs    Vitals:   01/14/19 2359 01/15/19 0344 01/15/19 0715 01/15/19 1108  BP: 99/76 94/65 (!) 89/70   Pulse: 87 89    Resp: 14 13 15    Temp: 97.6 F (36.4 C) 98.3 F (36.8 C) 97.6 F (36.4 C) 97.6 F (36.4 C)  TempSrc: Oral Oral Oral Oral  SpO2: 98% 96%    Weight:  105.9 kg    Height:        Intake/Output Summary (Last 24 hours) at 01/15/2019 1154 Last data filed at 01/15/2019 1991 Gross per 24 hour  Intake 610 ml  Output 1350 ml  Net -740 ml   Filed Weights   01/13/19 0500 01/14/19 0304 01/15/19 0344  Weight: 106.2 kg 105.6 kg 105.9 kg    Telemetry    Sinus tach - Personally Reviewed  ECG    NA - Personally Reviewed  Physical Exam   GEN: No acute distress.   Neck:   Mild positive  JVD Cardiac: RRR, no murmurs, rubs, or gallops.  Respiratory: Clear  to auscultation bilaterally. GI: Soft, nontender, non-distended  MS:   Trace leg edema; No deformity. Neuro:  Nonfocal  Psych: Normal affect   Labs    Chemistry Recent Labs  Lab 01/13/19 0238 01/13/19 0919  01/13/19 1502 01/14/19 0235 01/15/19 0309  NA 139 138   < > 138 139 139  K  3.0* 3.4*   < > 3.5 3.6 3.6  CL 98 97*  --   --  97* 98  CO2 28 27  --   --  29 28  GLUCOSE 123* 134*  --   --  116* 120*  BUN 35* 35*  --   --  35* 33*  CREATININE 1.32* 1.39*  --   --  1.41* 1.14*  CALCIUM 9.6 9.4  --   --  9.1 9.3  PROT 7.9  --   --   --   --  8.0  ALBUMIN 3.7  --   --   --   --  3.4*  AST 41  --   --   --   --  29  ALT 66*  --   --   --   --  47*  ALKPHOS 77  --   --   --   --  70  BILITOT 1.1  --   --   --   --  0.8  GFRNONAA 43* 41*  --   --  40* 51*  GFRAA 50* 47*  --   --  46* 60*  ANIONGAP 13 14  --   --  13 13   < > = values in this interval not displayed.     Hematology Recent Labs  Lab 01/13/19 0238  01/13/19 1458 01/13/19 1502 01/14/19 0235  WBC 9.9  --   --   --  8.8  RBC 6.24*  --   --   --  5.99*  HGB 17.3*   < > 17.7* 17.3* 16.6*  HCT 51.9*   < > 52.0* 51.0* 49.8*  MCV 83.2  --   --   --  83.1  MCH 27.7  --   --   --  27.7  MCHC 33.3  --   --   --  33.3  RDW 15.2  --   --   --  14.9  PLT 305  --   --   --  285   < > = values in this interval not displayed.    Cardiac Enzymes Recent Labs  Lab 01/13/19 0919 01/13/19 1544 01/13/19 2040  TROPONINI 0.29* 0.36* 0.37*   No results for input(s): TROPIPOC in the last 168 hours.   BNPNo results for input(s): BNP, PROBNP in the last 168 hours.   DDimer No results for input(s): DDIMER in the last 168 hours.   Radiology    No results found.  Cardiac Studies   Per Consult note, [facility] Echo there on 01/10/19 showed EF of 25-30% with severely dilated LV, moderate to severe MR, and moderate to severe TR.   Right Heart Pressures Right atrial pressure- 6/5 Right ventricular pressure- 33/3 Pulmonary artery pressure- 35/21, mean 26 Pulmonary wedge pressure-A-wave 21, V wave 17, mean 17 LVEDP-32 Cardiac output- 3.74 L/min  Coronary Diagrams   Diagnostic  Dominance: Right        Patient Profile     63 y.o. female with HTN, GERD, HLD, obesity, pre-DM presented with several-week  hx of abdominal distention, SOB, edema. Found to have LVEF 25-30%, moderate-severe MR, moderate-severe TR. EKG with NSR with LBBB. Brief episode of possible atrial flutter noted on telemetry. R/LHC felt to represent NICM, LVEDP .  Assessment & Plan    1. Acute systolic CHF -  New onset EF 35 - 30%.  Non ischemic. Negative 1.5 liters since admission.   Change to PO Lasix.  Low BP will not allow med titration.      2. Mitral regurgitation and tricuspid regurgitation -  Manage with meds as above and follow clinically and with echoes in the future.    3. Acute kidney injury -    Creat is improved.    4. Hyperlipidemia -   Continue current therapy.     5. Tachycardia-   There is mention of flutter.  However, I looked through the strips/EKGs from [place] and here and see sinus tach only.    For questions or updates, please contact [facility] Please consult [email or web address] for contact info under Cardiology/STEMI.   Signed, Rollene Rotunda, MD  01/15/2019, 11:54 AM

## 2019-01-15 NOTE — Evaluation (Signed)
Physical Therapy Evaluation Patient Details Name: Breanna Bennett MRN: [id number] DOB: 1956-08-19 Today's Date: 01/15/2019   History of Present Illness  63 y.o. female with HTN, GERD, HLD, obesity, pre-DM presented with several-week hx of abdominal distention, SOB, edema. Found to have LVEF 25-30%, moderate-severe MR, moderate-severe TR. EKG with NSR with LBBB. Brief episode of possible atrial flutter noted on telemetry. R/LHC felt to represent NICM, LVEDP .  Clinical Impression  Pt admitted with above diagnosis. Pt currently with functional limitations due to the deficits listed below (see PT Problem List). Pt was able to ambulate with RW with min guard assist in hallway.  Pt VSS with DOE 2/4 with reports of fatigue at end of walk.  STeady with RW.  Will follow acutely.   Pt will benefit from skilled PT to increase their independence and safety with mobility to allow discharge to the venue listed below.      Follow Up Recommendations Home health PT(HHOT for eval for tub equipment)    Equipment Recommendations  3in1 (PT)(rollator)    Recommendations for Other Services       Precautions / Restrictions Precautions Precautions: Fall Restrictions Weight Bearing Restrictions: No      Mobility  Bed Mobility               General bed mobility comments: in chair on arrival  Transfers Overall transfer level: Needs assistance   Transfers: Sit to/from Stand Sit to Stand: Min guard         General transfer comment: able to stand with cues for hand placement  Ambulation/Gait Ambulation/Gait assistance: Min guard Gait Distance (Feet): 350 Feet Assistive device: Rolling walker (2 wheeled) Gait Pattern/deviations: Step-through pattern;Decreased stride length   Gait velocity interpretation: <1.31 ft/sec, indicative of household ambulator General Gait Details: No issues with stability with RW support.  Pt HR 95-109 bpm with O2 100% on RA at rest and 92% on RA with actvitiy.   Other VSS.   Stairs            Wheelchair Mobility    Modified Rankin (Stroke Patients Only)       Balance Overall balance assessment: Needs assistance Sitting-balance support: No upper extremity supported;Feet supported Sitting balance-Leahy Scale: Fair     Standing balance support: Bilateral upper extremity supported;During functional activity Standing balance-Leahy Scale: Fair Standing balance comment: can stand statically without support but prefers to have something to hold onto.                              Pertinent Vitals/Pain Pain Assessment: No/denies pain    Home Living Family/patient expects to be discharged to:: Private residence Living Arrangements: Spouse/significant other;Other relatives Available Help at Discharge: Family;Available 24 hours/day Type of Home: House Home Access: Stairs to enter Entrance Stairs-Rails: Left Entrance Stairs-Number of Steps: 4 Home Layout: One level Home Equipment: None      Prior Function Level of Independence: Independent               Hand Dominance        Extremity/Trunk Assessment   Upper Extremity Assessment Upper Extremity Assessment: Defer to OT evaluation    Lower Extremity Assessment Lower Extremity Assessment: Generalized weakness    Cervical / Trunk Assessment Cervical / Trunk Assessment: Normal  Communication   Communication: No difficulties  Cognition Arousal/Alertness: Awake/alert Behavior During Therapy: WFL for tasks assessed/performed Overall Cognitive Status: Within Functional Limits for tasks assessed  General Comments      Exercises General Exercises - Lower Extremity Ankle Circles/Pumps: AROM;Both;10 reps;Supine Long Arc Quad: AROM;Both;10 reps;Seated   Assessment/Plan    PT Assessment Patient needs continued PT services  PT Problem List Decreased activity tolerance;Decreased balance;Decreased  mobility;Decreased knowledge of use of DME;Decreased safety awareness;Decreased knowledge of precautions;Cardiopulmonary status limiting activity       PT Treatment Interventions DME instruction;Gait training;Stair training;Functional mobility training;Balance training;Therapeutic activities;Therapeutic exercise;Patient/family education    PT Goals (Current goals can be found in the Care Plan section)  Acute Rehab PT Goals Patient Stated Goal: to go home PT Goal Formulation: With patient Time For Goal Achievement: 01/29/19 Potential to Achieve Goals: Good    Frequency Min 3X/week   Barriers to discharge        Co-evaluation               AM-PAC PT "6 Clicks" Mobility  Outcome Measure Help needed turning from your back to your side while in a flat bed without using bedrails?: None Help needed moving from lying on your back to sitting on the side of a flat bed without using bedrails?: None Help needed moving to and from a bed to a chair (including a wheelchair)?: A Little Help needed standing up from a chair using your arms (e.g., wheelchair or bedside chair)?: A Little Help needed to walk in hospital room?: A Little Help needed climbing 3-5 steps with a railing? : A Little 6 Click Score: 20    End of Session Equipment Utilized During Treatment: Gait belt Activity Tolerance: Patient limited by fatigue Patient left: in chair;with call bell/phone within reach Nurse Communication: Mobility status PT Visit Diagnosis: Muscle weakness (generalized) (M62.81)    Time: 5784-6962 PT Time Calculation (min) (ACUTE ONLY): 14 min   Charges:   PT Evaluation $PT Eval Moderate Complexity: 1 Mod          Malaijah Houchen,PT Acute Rehabilitation Services Pager:  [phone]  Office:  [phone]    Berline Lopes 01/15/2019, 1:50 PM

## 2019-01-15 NOTE — Plan of Care (Signed)

## 2019-01-15 NOTE — Progress Notes (Signed)
PROGRESS NOTE    Breanna Bennett  ILN:[id number] DOB: July 18, 1956 DOA: 01/12/2019 PCP: Patient, No Pcp Per   Brief Narrative:   Breanna Bennett is a 63 y.o. female with medical history significant for hypertension, GERD, hyperlipidemia, and prediabetes, who presented to the emergency department on 01/09/2019 with several months of progressive exertional dyspnea, orthopnea, bilateral lower extremity swelling, and abdominal distention. She was admitted for CHF at [facility] hospital and transferred to [facility] for cardiac cath.   She was seen by cardiology, underwent transthoracic echo that demonstrated EF 25 to 30% with severe LV dilation, and severe global hypokinesis.   Cardiologist at [facility] recommended transfer to [facility] for cardiac catheterization.  Assessment & Plan:   Principal Problem:   Acute systolic CHF (congestive heart failure) (HCC) Active Problems:   Essential hypertension   Acute systolic heart failure  Improved, diuresed about in the last day, transition to oral lasix on discharge.  , coreg 6.25 mg BID, cozaar 25 mg daily.  - add potassium supplementation daily.  - cardiology consulted and she underwent cardiac cath on 5/21, showed dilated non ischemic cardiomyopathy .  - resume aspirin 81 mg daily and lipitor 10 mg daily.    Essential hypertension:  Well controlled.    Elevated troponin's; Probably from demand ischemia.  Pt denies any chest pain.    ? Atrial flutter: New LBBB Will need EP evaluation in the future.  Currently in SR on coreg.     Hyokalemia: Replaced.      DVT prophylaxis: lovenox.  Code Status:full code.  Family Communication: none at bedside.  Disposition Plan: possible d/c home tomorrow.    Consultants:   Cardiology.    Procedures: cardiac catheterization.   Antimicrobials: none.   Subjective: No chest pain or sob, feels much better.  Alert and oriented.   Objective: Vitals:   01/15/19 0344 01/15/19 0715  01/15/19 1108 01/15/19 1630  BP: 94/65 (!) 89/70  104/82  Pulse: 89     Resp: 13 15    Temp: 98.3 F (36.8 C) 97.6 F (36.4 C) 97.6 F (36.4 C) (!) 97.5 F (36.4 C)  TempSrc: Oral Oral Oral Oral  SpO2: 96%     Weight: 105.9 kg     Height:        Intake/Output Summary (Last 24 hours) at 01/15/2019 1814 Last data filed at 01/15/2019 1713 Gross per 24 hour  Intake 660 ml  Output 1350 ml  Net -690 ml   Filed Weights   01/13/19 0500 01/14/19 0304 01/15/19 0344  Weight: 106.2 kg 105.6 kg 105.9 kg    Examination:  General exam: Appears calm and comfortable , no distress noted.  Respiratory system: bilateral air entry fair.  Cardiovascular system: S1 & S2 heard, RRR. Mild pedal edema. Gastrointestinal system: Abdomen is soft, NT ND BS+ Central nervous system: Alert and oriented. No focal neurological deficits. Extremities: Symmetric 5 x 5 power. Skin: No rashes, lesions or ulcers Psychiatry: . Mood & affect appropriate.     Data Reviewed: I have personally reviewed following labs and imaging studies  CBC: Recent Labs  Lab 01/13/19 0238 01/13/19 1457 01/13/19 1458 01/13/19 1502 01/14/19 0235  WBC 9.9  --   --   --  8.8  NEUTROABS 5.7  --   --   --   --   HGB 17.3* 17.0* 17.7* 17.3* 16.6*  HCT 51.9* 50.0* 52.0* 51.0* 49.8*  MCV 83.2  --   --   --  83.1  PLT  305  --   --   --  285   Basic Metabolic Panel: Recent Labs  Lab 01/13/19 0238 01/13/19 0919 01/13/19 1457 01/13/19 1458 01/13/19 1502 01/14/19 0235 01/15/19 0309  NA 139 138 139 140 138 139 139  K 3.0* 3.4* 3.3* 3.5 3.5 3.6 3.6  CL 98 97*  --   --   --  97* 98  CO2 28 27  --   --   --  29 28  GLUCOSE 123* 134*  --   --   --  116* 120*  BUN 35* 35*  --   --   --  35* 33*  CREATININE 1.32* 1.39*  --   --   --  1.41* 1.14*  CALCIUM 9.6 9.4  --   --   --  9.1 9.3  MG 2.0  --   --   --   --   --   --    GFR: Estimated Creatinine Clearance: 65.2 mL/min (A) (by C-G formula based on SCr of 1.14 mg/dL  (H)). Liver Function Tests: Recent Labs  Lab 01/13/19 0238 01/15/19 0309  AST 41 29  ALT 66* 47*  ALKPHOS 77 70  BILITOT 1.1 0.8  PROT 7.9 8.0  ALBUMIN 3.7 3.4*   No results for input(s): LIPASE, AMYLASE in the last 168 hours. No results for input(s): AMMONIA in the last 168 hours. Coagulation Profile: No results for input(s): INR, PROTIME in the last 168 hours. Cardiac Enzymes: Recent Labs  Lab 01/13/19 0919 01/13/19 1544 01/13/19 2040  TROPONINI 0.29* 0.36* 0.37*   BNP (last 3 results) No results for input(s): PROBNP in the last 8760 hours. HbA1C: No results for input(s): HGBA1C in the last 72 hours. CBG: Recent Labs  Lab 01/14/19 1600 01/14/19 2142 01/15/19 0631 01/15/19 1122 01/15/19 1628  GLUCAP 101* 117* 100* 120* 123*   Lipid Profile: Recent Labs    01/15/19 0309  CHOL 200  HDL 42  LDLCALC 143*  TRIG 74  CHOLHDL 4.8   Thyroid Function Tests: Recent Labs    01/15/19 0309  TSH 0.993  FREET4 1.60   Anemia Panel: No results for input(s): VITAMINB12, FOLATE, FERRITIN, TIBC, IRON, RETICCTPCT in the last 72 hours. Sepsis Labs: No results for input(s): PROCALCITON, LATICACIDVEN in the last 168 hours.  Recent Results (from the past 240 hour(s))  MRSA PCR Screening     Status: None   Collection Time: 01/12/19  9:26 PM  Result Value Ref Range Status   MRSA by PCR NEGATIVE NEGATIVE Final    Comment:        The GeneXpert MRSA Assay (FDA approved for NASAL specimens only), is one component of a comprehensive MRSA colonization surveillance program. It is not intended to diagnose MRSA infection nor to guide or monitor treatment for MRSA infections. Performed at [facility], [address], [place], [place] [address]   SARS Coronavirus 2 (CEPHEID - Performed in [facility] hospital lab), Hosp Order     Status: None   Collection Time: 01/12/19 11:37 PM  Result Value Ref Range Status   SARS Coronavirus 2 NEGATIVE NEGATIVE Final    Comment:  (NOTE) If result is NEGATIVE SARS-CoV-2 target nucleic acids are NOT DETECTED. The SARS-CoV-2 RNA is generally detectable in upper and lower  respiratory specimens during the acute phase of infection. The lowest  concentration of SARS-CoV-2 viral copies this assay can detect is 250  copies / mL. A negative result does not preclude SARS-CoV-2  infection  and should not be used as the sole basis for treatment or other  patient management decisions.  A negative result may occur with  improper specimen collection / handling, submission of specimen other  than nasopharyngeal swab, presence of viral mutation(s) within the  areas targeted by this assay, and inadequate number of viral copies  (<250 copies / mL). A negative result must be combined with clinical  observations, patient history, and epidemiological information. If result is POSITIVE SARS-CoV-2 target nucleic acids are DETECTED. The SARS-CoV-2 RNA is generally detectable in upper and lower  respiratory specimens dur ing the acute phase of infection.  Positive  results are indicative of active infection with SARS-CoV-2.  Clinical  correlation with patient history and other diagnostic information is  necessary to determine patient infection status.  Positive results do  not rule out bacterial infection or co-infection with other viruses. If result is PRESUMPTIVE POSTIVE SARS-CoV-2 nucleic acids MAY BE PRESENT.   A presumptive positive result was obtained on the submitted specimen  and confirmed on repeat testing.  While 2019 novel coronavirus  (SARS-CoV-2) nucleic acids may be present in the submitted sample  additional confirmatory testing may be necessary for epidemiological  and / or clinical management purposes  to differentiate between  SARS-CoV-2 and other Sarbecovirus currently known to infect humans.  If clinically indicated additional testing with an alternate test  methodology [phone]) is advised. The SARS-CoV-2 RNA is  generally  detectable in upper and lower respiratory sp ecimens during the acute  phase of infection. The expected result is Negative. Fact Sheet for Patients:  BoilerBrush.com.cy Fact Sheet for Healthcare Providers: https://pope.com/ This test is not yet approved or cleared by the [place] FDA and has been authorized for detection and/or diagnosis of SARS-CoV-2 by FDA under an Emergency Use Authorization (EUA).  This EUA will remain in effect (meaning this test can be used) for the duration of the COVID-19 declaration under Section 564(b)(1) of the Act, 21 U.S.C. section 360bbb-3(b)(1), unless the authorization is terminated or revoked sooner. Performed at [facility], [address], [place], [place] [address]          Radiology Studies: No results found.      Scheduled Meds: . aspirin  81 mg Oral Daily  . atorvastatin  10 mg Oral q1800  . carvedilol  6.25 mg Oral BID WC  . enoxaparin (LOVENOX) injection  40 mg Subcutaneous Q24H  . famotidine  40 mg Oral Daily  . furosemide  40 mg Oral Daily  . losartan  25 mg Oral Daily  . potassium chloride  40 mEq Oral Daily  . sodium chloride flush  3 mL Intravenous Q12H   Continuous Infusions: . sodium chloride       LOS: 3 days    Time spent: 28 minutes.     Kathlen Mody, MD [facility] Pager [phone]  If 7PM-7AM, please contact night-coverage [email or web address] Password [facility] 01/15/2019, 6:14 PM

## 2019-01-16 DIAGNOSIS — I10 Essential (primary) hypertension: Secondary | ICD-10-CM | POA: Diagnosis not present

## 2019-01-16 DIAGNOSIS — I5021 Acute systolic (congestive) heart failure: Secondary | ICD-10-CM | POA: Diagnosis not present

## 2019-01-16 LAB — BASIC METABOLIC PANEL
Anion gap: 9 (ref 5–15)
BUN: 29 mg/dL — ABNORMAL HIGH (ref 8–23)
CO2: 27 mmol/L (ref 22–32)
Calcium: 9.1 mg/dL (ref 8.9–10.3)
Chloride: 102 mmol/L (ref 98–111)
Creatinine, Ser: 1.02 mg/dL — ABNORMAL HIGH (ref 0.44–1.00)
GFR calc Af Amer: >60 mL/min (ref >60)
GFR calc non Af Amer: 59 mL/min — ABNORMAL LOW (ref >60)
Glucose, Bld: 116 mg/dL — ABNORMAL HIGH (ref 70–99)
Potassium: 3.9 mmol/L (ref 3.5–5.1)
Sodium: 138 mmol/L (ref 135–145)

## 2019-01-16 MED ORDER — FUROSEMIDE 40 MG PO TABS: 40.0000 mg | ORAL_TABLET | Freq: Every day | ORAL | 1 refills | Status: DC

## 2019-01-16 MED ORDER — CARVEDILOL 6.25 MG PO TABS: 6.2500 mg | ORAL_TABLET | Freq: Two times a day (BID) | ORAL | 1 refills | Status: DC

## 2019-01-16 MED ORDER — POTASSIUM CHLORIDE CRYS ER 20 MEQ PO TBCR: 40.0000 meq | EXTENDED_RELEASE_TABLET | Freq: Every day | ORAL | 0 refills | Status: DC

## 2019-01-16 NOTE — Progress Notes (Signed)
Pt d/c to care of daughter. RN made daughter aware of patient's need for eye exam with RX glasses. Pt c/o room being dim even when lights are on fully. She also c/o of blurry vision.

## 2019-01-16 NOTE — TOC Transition Note (Addendum)
Transition of Care [facility]) - CM/SW Discharge Note   Patient Details  Name: Breanna Bennett MRN: [id number] Date of Birth: June 21, 1956  [facility] CM/SW Contact:  Deveron Furlong, RN Phone Number: 01/16/2019, 12:15 PM   Clinical Narrative:    Pt to return home with husband and 2 daughters to assist. Patient states she needs 3n1 and rollator as recommended by PT.  Patient also requests a hospital bed as she needs elevation of her head to sleep.  Patient states she has been sleeping in a recliner, but has been sleeping better in hospital bed.    Patient requires a hospital bed to be able to elevate the head of bed >30 degrees for adequate respirations.  Patient requires the ability to change positions rapidly.   Patient requests [name] for home health services and requests "as much help as I can get".     Final next level of care: Home w Home Health Services Barriers to Discharge: No Barriers Identified   Patient Goals and CMS Choice Patient states their goals for this hospitalization and ongoing recovery are:: to return home CMS Medicare.gov Compare Post Acute Care list provided to:: Patient Choice offered to / list presented to : Patient   Discharge Plan and Services                DME Arranged: 3-N-1, Hospital bed, Walker rolling with seat DME Agency: [employer] Date DME Agency Contacted: 01/16/19 Time DME Agency Contacted: 1215 Representative spoke with at DME Agency: Mitchell Heir HH Arranged: RN, PT, OT, Nurse's Aide, Social Work [employer] Agency: [facility] Date [facility] Agency Contacted: 01/16/19 Time HH Agency Contacted: 1220 Representative spoke with at [facility] Agency: Kandee Keen   Addendum: 1400 [name] unable to staff for patient's needs.  [facility] contacted.   1500: [facility] unable to staff for patient's needs.  [facility] asked to check.  1530: [facility] is able to take patient's referral per Feliberto Gottron.

## 2019-01-16 NOTE — Progress Notes (Signed)
Progress Note  Patient Name: Breanna Bennett Date of Encounter: 01/16/2019  Primary Cardiologist:   No primary care provider on file.   Subjective   She feels good today.  She has not walked.  Breathing is better.  Less dizzy sitting  Inpatient Medications    Scheduled Meds: . aspirin  81 mg Oral Daily  . atorvastatin  10 mg Oral q1800  . carvedilol  6.25 mg Oral BID WC  . enoxaparin (LOVENOX) injection  40 mg Subcutaneous Q24H  . famotidine  40 mg Oral Daily  . furosemide  40 mg Oral Daily  . losartan  25 mg Oral Daily  . potassium chloride  40 mEq Oral Daily  . sodium chloride flush  3 mL Intravenous Q12H   Continuous Infusions: . sodium chloride     PRN Meds: sodium chloride, acetaminophen, guaiFENesin-dextromethorphan, morphine injection, ondansetron (ZOFRAN) IV, sodium chloride flush   Vital Signs    Vitals:   01/15/19 2227 01/15/19 2344 01/16/19 0427 01/16/19 0716  BP: 105/79 107/84 113/79 115/81  Pulse: 88 82 92   Resp: (!) 23 20 17    Temp: 98 F (36.7 C) 97.6 F (36.4 C) 97.7 F (36.5 C) 97.6 F (36.4 C)  TempSrc: Oral Oral Oral Oral  SpO2: 98% 98% 98%   Weight:      Height:        Intake/Output Summary (Last 24 hours) at 01/16/2019 0915 Last data filed at 01/16/2019 0314 Gross per 24 hour  Intake 440 ml  Output 600 ml  Net -160 ml   Filed Weights   01/13/19 0500 01/14/19 0304 01/15/19 0344  Weight: 106.2 kg 105.6 kg 105.9 kg    Telemetry    Sinus tach - Personally Reviewed  ECG    NA - Personally Reviewed  Physical Exam   GEN: No  acute distress.   Neck: No  JVD Cardiac: Regular RR, no murmurs, rubs, or gallops.  Respiratory: Clear  to auscultation bilaterally. GI: Soft, nontender, non-distended, normal bowel sounds  MS:  Trace edema; No deformity. Neuro:   Nonfocal  Psych: Oriented and appropriate    Labs    Chemistry Recent Labs  Lab 01/13/19 0238  01/14/19 0235 01/15/19 0309 01/16/19 0303  NA 139   < > 139 139 138   K 3.0*   < > 3.6 3.6 3.9  CL 98   < > 97* 98 102  CO2 28   < > 29 28 27   GLUCOSE 123*   < > 116* 120* 116*  BUN 35*   < > 35* 33* 29*  CREATININE 1.32*   < > 1.41* 1.14* 1.02*  CALCIUM 9.6   < > 9.1 9.3 9.1  PROT 7.9  --   --  8.0  --   ALBUMIN 3.7  --   --  3.4*  --   AST 41  --   --  29  --   ALT 66*  --   --  47*  --   ALKPHOS 77  --   --  70  --   BILITOT 1.1  --   --  0.8  --   GFRNONAA 43*   < > 40* 51* 59*  GFRAA 50*   < > 46* 60* >60  ANIONGAP 13   < > 13 13 9    < > = values in this interval not displayed.     Hematology Recent Labs  Lab 01/13/19 0238  01/13/19 1458 01/13/19 1502 01/14/19  0235  WBC 9.9  --   --   --  8.8  RBC 6.24*  --   --   --  5.99*  HGB 17.3*   < > 17.7* 17.3* 16.6*  HCT 51.9*   < > 52.0* 51.0* 49.8*  MCV 83.2  --   --   --  83.1  MCH 27.7  --   --   --  27.7  MCHC 33.3  --   --   --  33.3  RDW 15.2  --   --   --  14.9  PLT 305  --   --   --  285   < > = values in this interval not displayed.    Cardiac Enzymes Recent Labs  Lab 01/13/19 0919 01/13/19 1544 01/13/19 2040  TROPONINI 0.29* 0.36* 0.37*   No results for input(s): TROPIPOC in the last 168 hours.   BNPNo results for input(s): BNP, PROBNP in the last 168 hours.   DDimer No results for input(s): DDIMER in the last 168 hours.   Radiology    No results found.  Cardiac Studies   Per Consult note, [facility] Echo there on 01/10/19 showed EF of 25-30% with severely dilated LV, moderate to severe MR, and moderate to severe TR.   Right Heart Pressures Right atrial pressure- 6/5 Right ventricular pressure- 33/3 Pulmonary artery pressure- 35/21, mean 26 Pulmonary wedge pressure-A-wave 21, V wave 17, mean 17 LVEDP-32 Cardiac output- 3.74 L/min  Coronary Diagrams   Diagnostic  Dominance: Right        Patient Profile     63 y.o. female with HTN, GERD, HLD, obesity, pre-DM presented with several-week hx of abdominal distention, SOB, edema. Found to have LVEF 25-30%,  moderate-severe MR, moderate-severe TR. EKG with NSR with LBBB. Brief episode of possible atrial flutter noted on telemetry. R/LHC felt to represent NICM, LVEDP .  Assessment & Plan    1. Acute systolic CHF -  New onset EF 35 - 30%.  Non ischemic. Negative 1.6.  Changed to PO Lasix yesterday. No change in therapy otherwise.  Will titrate meds slowly as an out patient given the low BP.   2. Mitral regurgitation and tricuspid regurgitation -    We will manage this with med titration and follow clinically as an out patient and with repeat echocardiograms.   3. Acute kidney injury -    Creat is improved again today.   4. Hyperlipidemia -   Continue current therapy.    5. Tachycardia-   Sinus tachy.  Will treat in the context of titrating her meds as an out patient.     OK from my standpoint to go home when OK with primary service.  We will arrange follow up.    For questions or updates, please contact [facility] Please consult [email or web address] for contact info under Cardiology/STEMI.   Signed, Rollene Rotunda, MD  01/16/2019, 9:15 AM

## 2019-01-16 NOTE — Plan of Care (Signed)

## 2019-01-17 NOTE — Discharge Summary (Signed)
Physician Discharge Summary  Breanna Bennett NTZ:[id number] DOB: September 23, 1955 DOA: 01/12/2019  PCP: Patient, No Pcp Per  Admit date: 01/12/2019 Discharge date: 01/16/2019  Admitted From: Home.  Disposition:  Home with home health.   Recommendations for Outpatient Follow-up:  1. Follow up with PCP in 1-2 weeks 2. Please obtain BMP/CBC in one week Please follow up with cardiology as recommended.   Discharge Condition:stable.  CODE STATUS: full code.  Diet recommendation: Heart Healthy   Brief/Interim Summary:  Breanna Cassidyis a 63 y.o.femalewith medical history significant forhypertension, GERD, hyperlipidemia, and prediabetes, who presented to the emergency departmenton 5/17/2020with several months of progressive exertional dyspnea, orthopnea, bilateral lower extremity swelling, and abdominal distention. She was admitted for CHF at [facility] hospital and transferred to [facility] for cardiac cath.   She was seen by cardiology, underwent transthoracic echo that demonstrated EF 25 to 30% with severe LV dilation, and severe global hypokinesis. Cardiologist at [facility] recommended transfer to [facility] for cardiac catheterization.  Discharge Diagnoses:  Principal Problem:   Acute systolic CHF (congestive heart failure) (HCC) Active Problems:   Essential hypertension  Acute systolic heart failure  Improved, discharged on oral lasix,  coreg 6.25 mg BID, cozaar 25 mg daily.  - add potassium supplementation daily.  - cardiology consulted and she underwent cardiac cath on 5/21, showed dilated non ischemic cardiomyopathy .  - resume aspirin 81 mg daily and lipitor 10 mg daily.    Essential hypertension:  Well controlled.    Elevated troponin's; Probably from demand ischemia.  Pt denies any chest pain.     Hyokalemia: Replaced.      Discharge Instructions  Discharge Instructions    AMB Referral to Cardiac Rehabilitation - Phase II   Complete by:  As directed     Diagnosis:  Heart Failure (see criteria below if ordering Phase II)   Heart Failure Type:  Chronic Systolic   After initial evaluation and assessments completed: Virtual Based Care may be provided alone or in conjunction with Phase 2 Cardiac Rehab based on patient barriers.:  Yes   Diet - low sodium heart healthy   Complete by:  As directed    Discharge instructions   Complete by:  As directed    Please follow up with heart failure/cardiology as recommended.  You will need appointment with ophthalmology in one week.     Allergies as of 01/16/2019   No Known Allergies     Medication List    STOP taking these medications   chlorthalidone 50 MG tablet Commonly known as:  HYGROTON     TAKE these medications   albuterol 108 (90 Base) MCG/ACT inhaler Commonly known as:  VENTOLIN HFA Inhale 2 puffs into the lungs every 4 (four) hours as needed for shortness of breath.   aspirin EC 81 MG tablet Take 81 mg by mouth daily.   atorvastatin 10 MG tablet Commonly known as:  LIPITOR Take 10 mg by mouth daily.   benzonatate 100 MG capsule Commonly known as:  TESSALON Take 100 mg by mouth as needed for cough.   carvedilol 6.25 MG tablet Commonly known as:  COREG Take 1 tablet (6.25 mg total) by mouth 2 (two) times daily with a meal.   famotidine 40 MG tablet Commonly known as:  PEPCID Take 40 mg by mouth daily.   fluticasone 50 MCG/ACT nasal spray Commonly known as:  FLONASE Place 2 sprays into both nostrils daily.   furosemide 40 MG tablet Commonly known as:  LASIX Take 1 tablet (  40 mg total) by mouth daily.   loratadine 10 MG tablet Commonly known as:  CLARITIN Take 10 mg by mouth daily.   losartan 25 MG tablet Commonly known as:  COZAAR Take 25 mg by mouth daily.   montelukast 10 MG tablet Commonly known as:  SINGULAIR Take 10 mg by mouth daily.   potassium chloride SA 20 MEQ tablet Commonly known as:  K-DUR Take 2 tablets (40 mEq total) by mouth daily.    Vitamin D (Ergocalciferol) 1.25 MG (50000 UT) Caps capsule Commonly known as:  DRISDOL Take 50,000 Units by mouth once a week.      Follow-up Information    Kathleene Hazel, MD Follow up.   Specialty:  Cardiology Why:  we will arrange for follow-up with one of our NPs or PAs and contact you. Contact information: [address] [place] [address] [phone]          No Known Allergies  Consultations:  Cardiology.    Procedures/Studies:  No results found. Cardiac catheterization.   Subjective: No chest pain or sob.   Discharge Exam: Vitals:   01/16/19 0716 01/16/19 1119  BP: 115/81 103/79  Pulse:    Resp:    Temp: 97.6 F (36.4 C) (!) 97.4 F (36.3 C)  SpO2:     Vitals:   01/15/19 2344 01/16/19 0427 01/16/19 0716 01/16/19 1119  BP: 107/84 113/79 115/81 103/79  Pulse: 82 92    Resp: 20 17    Temp: 97.6 F (36.4 C) 97.7 F (36.5 C) 97.6 F (36.4 C) (!) 97.4 F (36.3 C)  TempSrc: Oral Oral Oral Oral  SpO2: 98% 98%    Weight:      Height:        General: Pt is alert, awake, not in acute distress Cardiovascular: RRR, S1/S2 +, no rubs, no gallops Respiratory: CTA bilaterally, no wheezing, no rhonchi Abdominal: Soft, NT, ND, bowel sounds + Extremities: no edema, no cyanosis    The results of significant diagnostics from this hospitalization (including imaging, microbiology, ancillary and laboratory) are listed below for reference.     Microbiology: Recent Results (from the past 240 hour(s))  MRSA PCR Screening     Status: None   Collection Time: 01/12/19  9:26 PM  Result Value Ref Range Status   MRSA by PCR NEGATIVE NEGATIVE Final    Comment:        The GeneXpert MRSA Assay (FDA approved for NASAL specimens only), is one component of a comprehensive MRSA colonization surveillance program. It is not intended to diagnose MRSA infection nor to guide or monitor treatment for MRSA infections. Performed at [facility], [address] [place], [place] [address]   SARS Coronavirus 2 (CEPHEID - Performed in [facility] hospital lab), Hosp Order     Status: None   Collection Time: 01/12/19 11:37 PM  Result Value Ref Range Status   SARS Coronavirus 2 NEGATIVE NEGATIVE Final    Comment: (NOTE) If result is NEGATIVE SARS-CoV-2 target nucleic acids are NOT DETECTED. The SARS-CoV-2 RNA is generally detectable in upper and lower  respiratory specimens during the acute phase of infection. The lowest  concentration of SARS-CoV-2 viral copies this assay can detect is 250  copies / mL. A negative result does not preclude SARS-CoV-2 infection  and should not be used as the sole basis for treatment or other  patient management decisions.  A negative result may occur with  improper specimen collection /  handling, submission of specimen other  than nasopharyngeal swab, presence of viral mutation(s) within the  areas targeted by this assay, and inadequate number of viral copies  (<250 copies / mL). A negative result must be combined with clinical  observations, patient history, and epidemiological information. If result is POSITIVE SARS-CoV-2 target nucleic acids are DETECTED. The SARS-CoV-2 RNA is generally detectable in upper and lower  respiratory specimens dur ing the acute phase of infection.  Positive  results are indicative of active infection with SARS-CoV-2.  Clinical  correlation with patient history and other diagnostic information is  necessary to determine patient infection status.  Positive results do  not rule out bacterial infection or co-infection with other viruses. If result is PRESUMPTIVE POSTIVE SARS-CoV-2 nucleic acids MAY BE PRESENT.   A presumptive positive result was obtained on the submitted specimen  and confirmed on repeat testing.  While 2019 novel coronavirus  (SARS-CoV-2) nucleic acids may be present in the submitted sample  additional confirmatory testing may be  necessary for epidemiological  and / or clinical management purposes  to differentiate between  SARS-CoV-2 and other Sarbecovirus currently known to infect humans.  If clinically indicated additional testing with an alternate test  methodology [phone]([id number]) is advised. The SARS-CoV-2 RNA is generally  detectable in upper and lower respiratory sp ecimens during the acute  phase of infection. The expected result is Negative. Fact Sheet for Patients:  BoilerBrush.com.cyhttps://www.fda.gov/media/136312/download Fact Sheet for Healthcare Providers: https://pope.com/https://www.fda.gov/media/136313/download This test is not yet approved or cleared by the [place] FDA and has been authorized for detection and/or diagnosis of SARS-CoV-2 by FDA under an Emergency Use Authorization (EUA).  This EUA will remain in effect (meaning this test can be used) for the duration of the COVID-19 declaration under Section 564(b)(1) of the Act, 21 U.S.C. section 360bbb-3(b)(1), unless the authorization is terminated or revoked sooner. Performed at [facility], [address] [place], [place] [id number]      Labs: BNP (last 3 results) No results for input(s): BNP in the last 8760 hours. Basic Metabolic Panel: Recent Labs  Lab 01/13/19 0238 01/13/19 0919  01/13/19 1458 01/13/19 1502 01/14/19 0235 01/15/19 0309 01/16/19 0303  NA 139 138   < > 140 138 139 139 138  K 3.0* 3.4*   < > 3.5 3.5 3.6 3.6 3.9  CL 98 97*  --   --   --  97* 98 102  CO2 28 27  --   --   --  29 28 27   GLUCOSE 123* 134*  --   --   --  116* 120* 116*  BUN 35* 35*  --   --   --  35* 33* 29*  CREATININE 1.32* 1.39*  --   --   --  1.41* 1.14* 1.02*  CALCIUM 9.6 9.4  --   --   --  9.1 9.3 9.1  MG 2.0  --   --   --   --   --   --   --    < > = values in this interval not displayed.   Liver Function Tests: Recent Labs  Lab 01/13/19 0238 01/15/19 0309  AST 41 29  ALT 66* 47*  ALKPHOS 77 70  BILITOT 1.1 0.8  PROT 7.9 8.0  ALBUMIN 3.7 3.4*   No  results for input(s): LIPASE, AMYLASE in the last 168 hours. No results for input(s): AMMONIA in the last 168 hours. CBC: Recent Labs  Lab 01/13/19 0238  01/13/19 1457 01/13/19 1458 01/13/19 1502 01/14/19 0235  WBC 9.9  --   --   --  8.8  NEUTROABS 5.7  --   --   --   --   HGB 17.3* 17.0* 17.7* 17.3* 16.6*  HCT 51.9* 50.0* 52.0* 51.0* 49.8*  MCV 83.2  --   --   --  83.1  PLT 305  --   --   --  285   Cardiac Enzymes: Recent Labs  Lab 01/13/19 0919 01/13/19 1544 01/13/19 2040  TROPONINI 0.29* 0.36* 0.37*   BNP: Invalid input(s): POCBNP CBG: Recent Labs  Lab 01/14/19 2142 01/15/19 0631 01/15/19 1122 01/15/19 1628 01/15/19 2101  GLUCAP 117* 100* 120* 123* 138*   D-Dimer No results for input(s): DDIMER in the last 72 hours. Hgb A1c No results for input(s): HGBA1C in the last 72 hours. Lipid Profile Recent Labs    01/15/19 0309  CHOL 200  HDL 42  LDLCALC 143*  TRIG 74  CHOLHDL 4.8   Thyroid function studies Recent Labs    01/15/19 0309  TSH 0.993   Anemia work up No results for input(s): VITAMINB12, FOLATE, FERRITIN, TIBC, IRON, RETICCTPCT in the last 72 hours. Urinalysis No results found for: COLORURINE, APPEARANCEUR, LABSPEC, PHURINE, GLUCOSEU, HGBUR, BILIRUBINUR, KETONESUR, PROTEINUR, UROBILINOGEN, NITRITE, LEUKOCYTESUR Sepsis Labs Invalid input(s): PROCALCITONIN,  WBC,  LACTICIDVEN Microbiology Recent Results (from the past 240 hour(s))  MRSA PCR Screening     Status: None   Collection Time: 01/12/19  9:26 PM  Result Value Ref Range Status   MRSA by PCR NEGATIVE NEGATIVE Final    Comment:        The GeneXpert MRSA Assay (FDA approved for NASAL specimens only), is one component of a comprehensive MRSA colonization surveillance program. It is not intended to diagnose MRSA infection nor to guide or monitor treatment for MRSA infections. Performed at [facility], [address] [place], [place] [address]   SARS Coronavirus 2 (CEPHEID -  Performed in [facility] hospital lab), Hosp Order     Status: None   Collection Time: 01/12/19 11:37 PM  Result Value Ref Range Status   SARS Coronavirus 2 NEGATIVE NEGATIVE Final    Comment: (NOTE) If result is NEGATIVE SARS-CoV-2 target nucleic acids are NOT DETECTED. The SARS-CoV-2 RNA is generally detectable in upper and lower  respiratory specimens during the acute phase of infection. The lowest  concentration of SARS-CoV-2 viral copies this assay can detect is 250  copies / mL. A negative result does not preclude SARS-CoV-2 infection  and should not be used as the sole basis for treatment or other  patient management decisions.  A negative result may occur with  improper specimen collection / handling, submission of specimen other  than nasopharyngeal swab, presence of viral mutation(s) within the  areas targeted by this assay, and inadequate number of viral copies  (<250 copies / mL). A negative result must be combined with clinical  observations, patient history, and epidemiological information. If result is POSITIVE SARS-CoV-2 target nucleic acids are DETECTED. The SARS-CoV-2 RNA is generally detectable in upper and lower  respiratory specimens dur ing the acute phase of infection.  Positive  results are indicative of active infection with SARS-CoV-2.  Clinical  correlation with patient history and other diagnostic information is  necessary to determine patient infection status.  Positive results do  not rule out bacterial infection or co-infection with other viruses. If result is PRESUMPTIVE POSTIVE SARS-CoV-2 nucleic acids MAY BE  PRESENT.   A presumptive positive result was obtained on the submitted specimen  and confirmed on repeat testing.  While 2019 novel coronavirus  (SARS-CoV-2) nucleic acids may be present in the submitted sample  additional confirmatory testing may be necessary for epidemiological  and / or clinical management purposes  to differentiate between   SARS-CoV-2 and other Sarbecovirus currently known to infect humans.  If clinically indicated additional testing with an alternate test  methodology [phone]) is advised. The SARS-CoV-2 RNA is generally  detectable in upper and lower respiratory sp ecimens during the acute  phase of infection. The expected result is Negative. Fact Sheet for Patients:  BoilerBrush.com.cy Fact Sheet for Healthcare Providers: https://pope.com/ This test is not yet approved or cleared by the [place] FDA and has been authorized for detection and/or diagnosis of SARS-CoV-2 by FDA under an Emergency Use Authorization (EUA).  This EUA will remain in effect (meaning this test can be used) for the duration of the COVID-19 declaration under Section 564(b)(1) of the Act, 21 U.S.C. section 360bbb-3(b)(1), unless the authorization is terminated or revoked sooner. Performed at [facility], [address], [place], [place] [address]      Time coordinating discharge: 36  minutes  SIGNED:   Kathlen Mody, MD  [facility] 01/17/2019, 9:10 AM Pager   If 7PM-7AM, please contact night-coverage [email or web address] Password [email or web address]

## 2019-01-18 ENCOUNTER — Telehealth: Payer: Self-pay | Admitting: Cardiovascular Disease

## 2019-01-18 NOTE — Telephone Encounter (Signed)
Patient contacted regarding discharge from [facility] on Sunday 01/16/2019. She did give permission over the phone for me to s/w her daughter Vivi Barrack concerning her care/health.  Trula Ore understands that the pt has a follow up with provider Ronie Spies, PA-c on 01/20/2019 at 10:45 at [address] [facility] [employer] [address] in [place]. Trula Ore understands discharge instructions? Yes Trula Ore understands medications and regiment? Yes Trula Ore understands to bring all medications to this visit? Yes

## 2019-01-18 NOTE — Telephone Encounter (Signed)
New message   Per Ward Givens set up a TOC Appt. with Ronie Spies on 01/20/2019 at 10:45 am.

## 2019-01-19 ENCOUNTER — Encounter: Payer: Self-pay | Admitting: Physician Assistant

## 2019-01-19 NOTE — Progress Notes (Signed)
Cardiology Office Note    Date:  01/20/2019  ID:  Breanna Bennett, DOB 1955/09/09, MRN [id number] PCP:  Patient, No Pcp Per  Cardiologist:  Verne Carrow, MD (in the hospital recently) - but patient will need to establish with [place] office   Chief Complaint: f/u CHF  History of Present Illness:  Breanna Bennett is a 63 y.o. female with history of HTN, GERD, HLD, obesity, pre-DM, recent diagnosis of NICM/systolic CHF, moderate-severe mitral regurgitation, moderate-severe TR, LBBB, renal insufficiency who presents for post-hospital f/u. She was recently admitted with several-week hx of abdominal distention, SOB, edema. 2D echo at outside hospital showed LVEF 25-30% with severely dilated LV, moderate-severe MR, moderate-severe TR. EKG with NSR with LBBB. She had a brief episode of possible atrial flutter noted on telemetry. R/LHC showed no significant CAD (per review with Dr. Clifton James on 5/22), felt to represent NICM, LVEDP . There was question of atrial flutter but Dr. Antoine Poche reviewed available information and saw sinus tach only. She had mild renal insufficiency with Cr up to 1.4 (new, previously 0.9) which improved to 1.02 by DC. Otherwise labs showed K 3.9, normal TSH/Ft4, LDL 143, Hgb 16.6, Hct 49.8, Mg 2.0, LFTs improving by time of DC. She does report having been sick a few weeks prior to onset of illness. BP was on the softer side, limiting aggressive med titration. DC weight not recorded, but 233 one day prior to DC.  She returns back today for follow-up and is feeling great. She has been increasing her activity and feeling good. She denies any CP, SOB, edema, abdominal distention, palpitations. She has had a few occasions of BP dropping into the 90s. Yesterday her BP dropped to 85/55 but it was rechecked a half hour later and was 126/80 so unclear if this was accurate or not. They called her PCP who instructed her to cut her carvedilol in half and cut her Lasix in half.  Her  weight has been stable at 230 at home by their scale.   Past Medical History:  Diagnosis Date  . Chronic systolic CHF (congestive heart failure) (HCC)   . Elevated hemoglobin (HCC)   . GERD (gastroesophageal reflux disease)   . Hyperlipidemia   . Hypertension   . LBBB (left bundle branch block)   . Mild renal insufficiency   . Mitral regurgitation   . NICM (nonischemic cardiomyopathy) (HCC)   . Pre-diabetes   . Tricuspid regurgitation     Past Surgical History:  Procedure Laterality Date  . RIGHT/LEFT HEART CATH AND CORONARY ANGIOGRAPHY N/A 01/13/2019   Procedure: RIGHT/LEFT HEART CATH AND CORONARY ANGIOGRAPHY;  Surgeon: Runell Gess, MD;  Location: [facility];  Service: Cardiovascular;  Laterality: N/A;    Current Medications: Current Meds  Medication Sig  . albuterol (VENTOLIN HFA) 108 (90 Base) MCG/ACT inhaler Inhale 2 puffs into the lungs every 4 (four) hours as needed for shortness of breath.  . ASPIRIN 81 PO Take 1 tablet by mouth daily.  Marland Kitchen atorvastatin (LIPITOR) 10 MG tablet Take 10 mg by mouth daily.  . benzonatate (TESSALON) 100 MG capsule Take 100 mg by mouth as needed for cough.  . carvedilol (COREG) 6.25 MG tablet Take 1 tablet (6.25 mg total) by mouth 2 (two) times daily with a meal.  . famotidine (PEPCID) 40 MG tablet Take 1 tablet (40 mg total) by mouth daily.  . fluticasone (FLONASE) 50 MCG/ACT nasal spray Place 2 sprays into both nostrils daily.  . furosemide (LASIX) 40 MG  tablet Take 1 tablet (40 mg total) by mouth daily.  Marland Kitchen. loratadine (CLARITIN) 10 MG tablet Take 10 mg by mouth daily.  Marland Kitchen. losartan (COZAAR) 25 MG tablet Take 25 mg by mouth daily.  . montelukast (SINGULAIR) 10 MG tablet Take 10 mg by mouth daily.  . potassium chloride SA (K-DUR) 20 MEQ tablet Take 2 tablets (40 mEq total) by mouth daily.  . Vitamin D, Ergocalciferol, (DRISDOL) 1.25 MG (50000 UT) CAPS capsule Take 50,000 Units by mouth once a week.     Allergies:   Patient has no  known allergies.   Social History   Socioeconomic History  . Marital status: Unknown    Spouse name: Not on file  . Number of children: Not on file  . Years of education: Not on file  . Highest education level: Not on file  Occupational History  . Not on file  Social Needs  . Financial resource strain: Not on file  . Food insecurity:    Worry: Not on file    Inability: Not on file  . Transportation needs:    Medical: Not on file    Non-medical: Not on file  Tobacco Use  . Smoking status: Never Smoker  . Smokeless tobacco: Never Used  Substance and Sexual Activity  . Alcohol use: Never    Frequency: Never  . Drug use: Never  . Sexual activity: Not on file  Lifestyle  . Physical activity:    Days per week: Not on file    Minutes per session: Not on file  . Stress: Not on file  Relationships  . Social connections:    Talks on phone: Not on file    Gets together: Not on file    Attends religious service: Not on file    Active member of club or organization: Not on file    Attends meetings of clubs or organizations: Not on file    Relationship status: Not on file  Other Topics Concern  . Not on file  Social History Narrative  . Not on file     Family History:  The patient's family history includes CAD in her mother; Heart failure in her mother.  ROS:   Please see the history of present illness. Otherwise, review of systems is positive for .  All other systems are reviewed and otherwise negative.    PHYSICAL EXAM:   VS:  BP 102/82   Pulse 91   Ht 5\' 8"  (1.727 m)   Wt 239 lb (108.4 kg)   SpO2 98%   BMI 36.34 kg/m   BMI: Body mass index is 36.34 kg/m. GEN: Well nourished, well developed obese AAF, in no acute distress HEENT: normocephalic, atraumatic, + stabismus Neck: no JVD, carotid bruits, or masses Cardiac: RRR; soft SEM at apex, no rubs or gallops, no edema  Respiratory:  clear to auscultation bilaterally, normal work of breathing GI: soft, nontender,  nondistended, + BS MS: no deformity or atrophy Skin: warm and dry, no rash. Right radial cath site without hematoma or ecchymosis; good pulse. Neuro:  Alert and Oriented x 3, Strength and sensation are intact, follows commands Psych: euthymic mood, full affect  Wt Readings from Last 3 Encounters:  01/20/19 239 lb (108.4 kg)  01/15/19 233 lb 7.5 oz (105.9 kg)      Studies/Labs Reviewed:   EKG:   EKG was not ordered today.  Recent Labs: 01/13/2019: Magnesium 2.0 01/14/2019: Hemoglobin 16.6; Platelets 285 01/15/2019: ALT 47; TSH 0.993 01/16/2019: BUN 29;  Creatinine, Ser 1.02; Potassium 3.9; Sodium 138   Lipid Panel    Component Value Date/Time   CHOL 200 01/15/2019 0309   TRIG 74 01/15/2019 0309   HDL 42 01/15/2019 0309   CHOLHDL 4.8 01/15/2019 0309   VLDL 15 01/15/2019 0309   LDLCALC 143 (H) 01/15/2019 0309    Additional studies/ records that were reviewed today include: Summarized above.    ASSESSMENT & PLAN:   1. Chronic systolic CHF/NICM - patient feels she is doing great and appears euvolemic. Wt is up by our scale but I'm not convinced of accuracy (clothing, shoes, etc) as her weight at home has been stable. Soft BP has limited aggressive med titration. Her PCP decreased carvedilol and Lasix by half yesterday  Her BP today is 102/82 with the above med changes so I do suspect she will require some standing reduction - will tentatively have her continue these lower doses with the tablets she has at home. These can be consolidated at f/u visit in 2 weeks if it is determined this is her new target regimen. Will check a BMET today. If Cr is stable, I would suggest she start spironolactone 12.5mg  daily with Lasix every other day and get BMET in a week. May need adjustment in potassium dose as well with this change. She is using a wrist cuff which can be inaccurate so I advised she get an arm cuff. She will continue to follow closely. May need cMRI at some point - will have her  establish with one of our cardiologists in [place] closer to her home to help guide that decision. Reviewed 2g sodium restriction, 2L fluid restriction, daily weights with patient. 2. Mild renal insufficiency - recheck BMET today. 3. Essential HTN - follow BP with med changes.  4. Hyperlipidemia - this is managed by primary care.  Disposition: F/u with [place] MD in 2 weeks for virtual OV.  Medication Adjustments/Labs and Tests Ordered: Current medicines are reviewed at length with the patient today.  Concerns regarding medicines are outlined above. Medication changes, Labs and Tests ordered today are summarized above and listed in the Patient Instructions accessible in Encounters.   Signed, Laurann Montana, PA-C  01/20/2019 11:25 AM    [facility] [address] [place], [place], [place]  [address] Phone: [phone]; Fax: [phone]

## 2019-01-20 ENCOUNTER — Ambulatory Visit (INDEPENDENT_AMBULATORY_CARE_PROVIDER_SITE_OTHER): Payer: BLUE CROSS/BLUE SHIELD | Admitting: Physician Assistant

## 2019-01-20 ENCOUNTER — Encounter: Payer: Self-pay | Admitting: Physician Assistant

## 2019-01-20 ENCOUNTER — Other Ambulatory Visit: Payer: Self-pay

## 2019-01-20 VITALS — BP 102/82 | HR 91 | Ht 68.0 in | Wt 239.0 lb

## 2019-01-20 DIAGNOSIS — I10 Essential (primary) hypertension: Secondary | ICD-10-CM | POA: Diagnosis not present

## 2019-01-20 DIAGNOSIS — N289 Disorder of kidney and ureter, unspecified: Secondary | ICD-10-CM | POA: Diagnosis not present

## 2019-01-20 DIAGNOSIS — I5022 Chronic systolic (congestive) heart failure: Secondary | ICD-10-CM | POA: Diagnosis not present

## 2019-01-20 DIAGNOSIS — I428 Other cardiomyopathies: Secondary | ICD-10-CM

## 2019-01-20 DIAGNOSIS — E785 Hyperlipidemia, unspecified: Secondary | ICD-10-CM

## 2019-01-20 LAB — BASIC METABOLIC PANEL
BUN/Creatinine Ratio: 20 (ref 12–28)
BUN: 23 mg/dL (ref 8–27)
CO2: 22 mmol/L (ref 20–29)
Calcium: 9.5 mg/dL (ref 8.7–10.3)
Chloride: 101 mmol/L (ref 96–106)
Creatinine, Ser: 1.14 mg/dL — ABNORMAL HIGH (ref 0.57–1.00)
GFR calc Af Amer: 60 mL/min/{1.73_m2} (ref >59)
GFR calc non Af Amer: 52 mL/min/{1.73_m2} — ABNORMAL LOW (ref >59)
Glucose: 121 mg/dL — ABNORMAL HIGH (ref 65–99)
Potassium: 4.6 mmol/L (ref 3.5–5.2)
Sodium: 139 mmol/L (ref 134–144)

## 2019-01-20 NOTE — Patient Instructions (Signed)
Medication Instructions:  Your physician recommends that you continue on your current medications as directed. Please refer to the Current Medication list given to you today.  It is ok to continue to take 1/2 of the Carvedilol twice a day and 1/2 of the Lasix as your primary care physician recommended.  If you need a refill on your cardiac medications before your next appointment, please call your pharmacy.   Lab work: TODAY:  BMET  If you have labs (blood work) drawn today and your tests are completely normal, you will receive your results only by: Marland Kitchen MyChart Message (if you have MyChart) OR . A paper copy in the mail If you have any lab test that is abnormal or we need to change your treatment, we will call you to review the results.  Testing/Procedures: None ordered   Follow-Up: At [facility], you and your health needs are our priority.  As part of our continuing mission to provide you with exceptional heart care, we have created designated Provider Care Teams.  These Care Teams include your primary Cardiologist (physician) and Advanced Practice Providers (APPs -  Physician Assistants and Nurse Practitioners) who all work together to provide you with the care you need, when you need it. . Pt needs a follow up appointment, virtually ok, with a [place] provider to get established and hospital f/u  Any Other Special Instructions Will Be Listed Below (If Applicable).

## 2019-01-21 ENCOUNTER — Telehealth: Payer: Self-pay | Admitting: *Deleted

## 2019-01-21 DIAGNOSIS — Z79899 Other long term (current) drug therapy: Secondary | ICD-10-CM

## 2019-01-21 MED ORDER — SPIRONOLACTONE 25 MG PO TABS: 12.5000 mg | ORAL_TABLET | Freq: Every day | ORAL | 3 refills | Status: DC

## 2019-01-21 MED ORDER — FUROSEMIDE 40 MG PO TABS: 20.0000 mg | ORAL_TABLET | ORAL | 1 refills | Status: DC

## 2019-01-21 NOTE — Telephone Encounter (Signed)
-----   Message from Laurann Montana, [place] sent at 01/20/2019  4:55 PM EDT ----- Please let patient know labs are stable.  Today we told her to continue the lower dose of carvedilol, cutting her tablets to take 1/2 BID. I would like her to start spironolactone at 12.5mg  daily and change Lasix to 1/2 tablet every other day. Hold potassium for now since we are starting spironolactone, and potassium level is creeping up. Do not throw it away in case we need it going forward. Please get BMET in 5-7 days. Let us know if any issues with blood pressure dropping or weight gain with this change - would like to be notified for even 3-4 lb difference.  Dayna Dunn PA-C

## 2019-01-24 ENCOUNTER — Telehealth: Payer: Self-pay | Admitting: Cardiology

## 2019-01-24 NOTE — Telephone Encounter (Signed)
New Message              Patient is calling to see if she can do her labs in [place] instead of coming to [place]? Pls call to advise.

## 2019-01-24 NOTE — Telephone Encounter (Signed)
Returned pt's call and she has been made aware that per [facility] office, pt can come there for labs, they are doing walk in's except from 12-1 lab tech is gone to lunch.  So pt is aware 01/28/2019 to go to the [place] office for labs.

## 2019-01-28 ENCOUNTER — Other Ambulatory Visit: Payer: BLUE CROSS/BLUE SHIELD

## 2019-01-31 ENCOUNTER — Other Ambulatory Visit: Payer: Self-pay

## 2019-01-31 ENCOUNTER — Other Ambulatory Visit: Payer: BLUE CROSS/BLUE SHIELD | Admitting: *Deleted

## 2019-01-31 DIAGNOSIS — Z79899 Other long term (current) drug therapy: Secondary | ICD-10-CM

## 2019-02-01 ENCOUNTER — Telehealth: Payer: Self-pay | Admitting: *Deleted

## 2019-02-01 LAB — BASIC METABOLIC PANEL
BUN/Creatinine Ratio: 16 (ref 12–28)
BUN: 20 mg/dL (ref 8–27)
CO2: 19 mmol/L — ABNORMAL LOW (ref 20–29)
Calcium: 9.7 mg/dL (ref 8.7–10.3)
Chloride: 107 mmol/L — ABNORMAL HIGH (ref 96–106)
Creatinine, Ser: 1.28 mg/dL — ABNORMAL HIGH (ref 0.57–1.00)
GFR calc Af Amer: 52 mL/min/{1.73_m2} — ABNORMAL LOW (ref >59)
GFR calc non Af Amer: 45 mL/min/{1.73_m2} — ABNORMAL LOW (ref >59)
Glucose: 138 mg/dL — ABNORMAL HIGH (ref 65–99)
Potassium: 4.4 mmol/L (ref 3.5–5.2)
Sodium: 144 mmol/L (ref 134–144)

## 2019-02-01 MED ORDER — FUROSEMIDE 40 MG PO TABS: ORAL_TABLET | ORAL | 3 refills | Status: DC

## 2019-02-01 NOTE — Telephone Encounter (Signed)
-----   Message from Charlie Pitter, [place] sent at 02/01/2019  7:30 AM EDT ----- Please let patient know labs are relatively stable. Creatinine is up just a small bit but still consistent with recent baseline around 1.1-1.4. The other labs that are abnormal - chloride and CO2- are minimally abnormal but can represent that she's drying out a little bit. If she has experienced any feelings of dehydration or continued low blood pressure, she can change her furosemide to 3x a week instead of every other day. On last labs we asked her to stop potassium - please take off med list once you confirm she has been off it. Otherwise keep f/u as planned w/ MD. Also needs reminder to continue regular followup w/ PCP for elevated blood sugar. Seems a little higher on this lab than then last (has pre-DM). Dayna Dunn PA-C

## 2019-02-06 DIAGNOSIS — I429 Cardiomyopathy, unspecified: Secondary | ICD-10-CM | POA: Diagnosis not present

## 2019-02-06 DIAGNOSIS — I5023 Acute on chronic systolic (congestive) heart failure: Secondary | ICD-10-CM | POA: Diagnosis not present

## 2019-02-06 DIAGNOSIS — R0789 Other chest pain: Secondary | ICD-10-CM

## 2019-02-06 DIAGNOSIS — I249 Acute ischemic heart disease, unspecified: Secondary | ICD-10-CM | POA: Diagnosis not present

## 2019-02-06 DIAGNOSIS — I10 Essential (primary) hypertension: Secondary | ICD-10-CM | POA: Diagnosis not present

## 2019-02-07 DIAGNOSIS — I249 Acute ischemic heart disease, unspecified: Secondary | ICD-10-CM | POA: Diagnosis not present

## 2019-02-07 DIAGNOSIS — I5023 Acute on chronic systolic (congestive) heart failure: Secondary | ICD-10-CM | POA: Diagnosis not present

## 2019-02-07 DIAGNOSIS — I10 Essential (primary) hypertension: Secondary | ICD-10-CM | POA: Diagnosis not present

## 2019-02-09 ENCOUNTER — Other Ambulatory Visit: Payer: Self-pay

## 2019-02-09 ENCOUNTER — Ambulatory Visit (INDEPENDENT_AMBULATORY_CARE_PROVIDER_SITE_OTHER): Payer: BLUE CROSS/BLUE SHIELD | Admitting: Cardiology

## 2019-02-09 ENCOUNTER — Encounter: Payer: Self-pay | Admitting: Cardiology

## 2019-02-09 VITALS — BP 120/70 | HR 88 | Ht 68.0 in | Wt 234.0 lb

## 2019-02-09 DIAGNOSIS — I10 Essential (primary) hypertension: Secondary | ICD-10-CM

## 2019-02-09 DIAGNOSIS — I428 Other cardiomyopathies: Secondary | ICD-10-CM | POA: Insufficient documentation

## 2019-02-09 DIAGNOSIS — E782 Mixed hyperlipidemia: Secondary | ICD-10-CM

## 2019-02-09 DIAGNOSIS — I5042 Chronic combined systolic (congestive) and diastolic (congestive) heart failure: Secondary | ICD-10-CM | POA: Diagnosis not present

## 2019-02-09 DIAGNOSIS — I5021 Acute systolic (congestive) heart failure: Secondary | ICD-10-CM | POA: Insufficient documentation

## 2019-02-09 HISTORY — DX: Acute systolic (congestive) heart failure: I50.21

## 2019-02-09 HISTORY — DX: Other cardiomyopathies: I42.8

## 2019-02-09 MED ORDER — ENTRESTO 24-26 MG PO TABS: 1.0000 | ORAL_TABLET | Freq: Two times a day (BID) | ORAL | 2 refills | Status: DC

## 2019-02-09 NOTE — Patient Instructions (Signed)
Medication Instructions:  Your physician has recommended you make the following change in your medication:   STOP: Losartan (stop for 48 hours before starting entresto)   START: Entresto 24/26 mg twice daily   If you need a refill on your cardiac medications before your next appointment, please call your pharmacy.   Lab work: None.    If you have labs (blood work) drawn today and your tests are completely normal, you will receive your results only by: Marland Kitchen MyChart Message (if you have MyChart) OR . A paper copy in the mail If you have any lab test that is abnormal or we need to change your treatment, we will call you to review the results.  Testing/Procedures: None.   Follow-Up: At [facility], you and your health needs are our priority.  As part of our continuing mission to provide you with exceptional heart care, we have created designated Provider Care Teams.  These Care Teams include your primary Cardiologist (physician) and Advanced Practice Providers (APPs -  Physician Assistants and Nurse Practitioners) who all work together to provide you with the care you need, when you need it. You will need a follow up appointment in 1 weeks.  Please call our office 2 months in advance to schedule this appointment.  You may see Lauree Chandler, MD or another member of our [place] Provider Team in [place]: Shirlee More, MD . Jyl Heinz, MD  Any Other Special Instructions Will Be Listed Below (If Applicable).  Sacubitril; Valsartan oral tablet What is this medicine? SACUBITRIL; VALSARTAN (sak UE bi tril; val SAR tan) is a combination of 2 drugs used to reduce the risk of death and hospitalizations in people with long-lasting heart failure. It is usually used with other medicines to treat heart failure. This medicine may be used for other purposes; ask your health care provider or pharmacist if you have questions. COMMON BRAND NAME(S): Entresto What should I tell my health  care provider before I take this medicine? They need to know if you have any of these conditions: -diabetes and take a medicine that contains aliskiren -kidney disease -liver disease -an unusual or allergic reaction to sacubitril; valsartan, drugs called angiotensin converting enzyme (ACE) inhibitors, angiotensin II receptor blockers (ARBs), other medicines, foods, dyes, or preservatives -pregnant or trying to get pregnant -breast-feeding How should I use this medicine? Take this medicine by mouth with a glass of water. Follow the directions on the prescription label. You can take it with or without food. If it upsets your stomach, take it with food. Take your medicine at regular intervals. Do not take it more often than directed. Do not stop taking except on your doctor's advice. Do not take this medicine for at least 36 hours before or after you take an ACE inhibitor medicine. Talk to your health care provider if you are not sure if you take an ACE inhibitor. Talk to your pediatrician regarding the use of this medicine in children. Special care may be needed. Overdosage: If you think you have taken too much of this medicine contact a poison control center or emergency room at once. NOTE: This medicine is only for you. Do not share this medicine with others. What if I miss a dose? If you miss a dose, take it as soon as you can. If it is almost time for next dose, take only that dose. Do not take double or extra doses. What may interact with this medicine? Do not take this medicine with any  of the following medicines: -aliskiren if you have diabetes -angiotensin-converting enzyme (ACE) inhibitors, like benazepril, captopril, enalapril, fosinopril, lisinopril, or ramipril This medicine may also interact with the following medicines: -angiotensin II receptor blockers (ARBs) like azilsartan, candesartan, eprosartan, irbesartan, losartan, olmesartan, telmisartan, or valsartan -lithium -NSAIDS,  medicines for pain and inflammation, like ibuprofen or naproxen -potassium-sparing diuretics like amiloride, spironolactone, and triamterene -potassium supplements This list may not describe all possible interactions. Give your health care provider a list of all the medicines, herbs, non-prescription drugs, or dietary supplements you use. Also tell them if you smoke, drink alcohol, or use illegal drugs. Some items may interact with your medicine. What should I watch for while using this medicine? Tell your doctor or healthcare professional if your symptoms do not start to get better or if they get worse. Do not become pregnant while taking this medicine. Women should inform their doctor if they wish to become pregnant or think they might be pregnant. There is a potential for serious side effects to an unborn child. Talk to your health care professional or pharmacist for more information. You may get dizzy. Do not drive, use machinery, or do anything that needs mental alertness until you know how this medicine affects you. Do not stand or sit up quickly, especially if you are an older patient. This reduces the risk of dizzy or fainting spells. Avoid alcoholic drinks; they can make you more dizzy. What side effects may I notice from receiving this medicine? Side effects that you should report to your doctor or health care professional as soon as possible: -allergic reactions like skin rash, itching or hives, swelling of the face, lips, or tongue -signs and symptoms of increased potassium like muscle weakness; chest pain; or fast, irregular heartbeat -signs and symptoms of kidney injury like trouble passing urine or change in the amount of urine -signs and symptoms of low blood pressure like feeling dizzy or lightheaded, or if you develop extreme fatigue Side effects that usually do not require medical attention (report to your doctor or health care professional if they continue or are  bothersome): -cough This list may not describe all possible side effects. Call your doctor for medical advice about side effects. You may report side effects to FDA at 1-800-FDA-1088. Where should I keep my medicine? Keep out of the reach of children. Store at room temperature between 15 and 30 degrees C (59 and 86 degrees F). Throw away any unused medicine after the expiration date. NOTE: This sheet is a summary. It may not cover all possible information. If you have questions about this medicine, talk to your doctor, pharmacist, or health care provider.  2019 Elsevier/Gold Standard (2015-09-26 13:54:19)

## 2019-02-09 NOTE — Progress Notes (Signed)
Cardiology Office Note:    Date:  02/09/2019   ID:  Breanna Bennett, DOB 1955/10/18, MRN [id number]  PCP:  Laurel Dimmer, FNP  Cardiologist:  Gypsy Balsam, MD    Referring MD: No ref. provider found   Chief Complaint  Patient presents with  . Hospitalization Follow-up  I was recently in the hospital  History of Present Illness:    Breanna Bennett is a 63 y.o. female with history of HTN, GERD, HLD, obesity, pre-DM, recent diagnosis of NICM/systolic CHF, moderate-severe mitral regurgitation, moderate-severe TR, LBBB, renal insufficiency who presents for post-hospital f/u. She was recently admitted with several-week hx of abdominal distention, SOB, edema. 2D echo at outside hospital showed LVEF 25-30% with severely dilated LV, moderate-severe MR, moderate-severe TR. EKG with NSR with LBBB. She had a brief episode of possible atrial flutter noted on telemetry. R/LHC showed no significant CAD (per review with Dr. Clifton James on 5/22), felt to represent NICM, LVEDP . There was question of atrial flutter but Dr. Antoine Poche reviewed available information and saw sinus tach only. She had mild renal insufficiency with Cr up to 1.4 (new, previously 0.9) which improved to 1.02 by DC. Recently admitted to round of hospital because of atypical chest pain on the right side of her chest and shoulder interestingly she had minimal raise of troponin I.  3 weeks before the presentation to hospital she had cardiac catheterization which showed normal coronaries.  Overall she is doing well she is coming today to my office for follow-up denies having a chest pain tightness squeezing pressure burning chest.  She is very active feeling good she lost significant amount of weight according to her scale she is 220 and stable.  The purpose of the visit today was augment her therapy.  I will switch her from losartan to [facility] sample will be given she will be taken 24/26.  I bring her to my office next week we  will do Chem-7 at that time and also will do EKG and blood pressure measurements to determine if we can increase dose of carvedilol.  Past Medical History:  Diagnosis Date  . Chronic systolic CHF (congestive heart failure) (HCC)   . Elevated hemoglobin (HCC)   . GERD (gastroesophageal reflux disease)   . Hyperlipidemia   . Hypertension   . LBBB (left bundle branch block)   . Mild renal insufficiency   . Mitral regurgitation   . NICM (nonischemic cardiomyopathy) (HCC)   . Pre-diabetes   . Tricuspid regurgitation     Past Surgical History:  Procedure Laterality Date  . RIGHT/LEFT HEART CATH AND CORONARY ANGIOGRAPHY N/A 01/13/2019   Procedure: RIGHT/LEFT HEART CATH AND CORONARY ANGIOGRAPHY;  Surgeon: Runell Gess, MD;  [facility];  Service: Cardiovascular;  Laterality: N/A;  . TUBAL LIGATION      Current Medications: Current Meds  Medication Sig  . albuterol (VENTOLIN HFA) 108 (90 Base) MCG/ACT inhaler Inhale 2 puffs into the lungs every 4 (four) hours as needed for shortness of breath.  . ASPIRIN 81 PO Take 1 tablet by mouth daily.  Marland Kitchen atorvastatin (LIPITOR) 10 MG tablet Take 10 mg by mouth daily.  . benzonatate (TESSALON) 100 MG capsule Take 100 mg by mouth as needed for cough.  . carvedilol (COREG) 3.125 MG tablet Take 3.125 mg by mouth 2 (two) times daily with a meal.  . famotidine (PEPCID) 40 MG tablet Take 1 tablet (40 mg total) by mouth daily.  . fluticasone (FLONASE) 50 MCG/ACT nasal spray  Place 2 sprays into both nostrils daily.  . furosemide (LASIX) 40 MG tablet Take 1/2 tablet three times a week  . loratadine (CLARITIN) 10 MG tablet Take 10 mg by mouth daily.  Marland Kitchen omeprazole (PRILOSEC) 20 MG capsule Take 20 mg by mouth daily.  Marland Kitchen spironolactone (ALDACTONE) 25 MG tablet Take 0.5 tablets (12.5 mg total) by mouth daily.  . Vitamin D, Ergocalciferol, (DRISDOL) 1.25 MG (50000 UT) CAPS capsule Take 50,000 Units by mouth once a week.  . [DISCONTINUED]  losartan (COZAAR) 25 MG tablet Take 25 mg by mouth 2 (two) times daily.      Allergies:   Patient has no known allergies.   Social History   Socioeconomic History  . Marital status: Unknown    Spouse name: Not on file  . Number of children: Not on file  . Years of education: Not on file  . Highest education level: Not on file  Occupational History  . Not on file  Social Needs  . Financial resource strain: Not on file  . Food insecurity    Worry: Not on file    Inability: Not on file  . Transportation needs    Medical: Not on file    Non-medical: Not on file  Tobacco Use  . Smoking status: Never Smoker  . Smokeless tobacco: Never Used  Substance and Sexual Activity  . Alcohol use: Never    Frequency: Never  . Drug use: Never  . Sexual activity: Not on file  Lifestyle  . Physical activity    Days per week: Not on file    Minutes per session: Not on file  . Stress: Not on file  Relationships  . Social Herbalist on phone: Not on file    Gets together: Not on file    Attends religious service: Not on file    Active member of club or organization: Not on file    Attends meetings of clubs or organizations: Not on file    Relationship status: Not on file  Other Topics Concern  . Not on file  Social History Narrative  . Not on file     Family History: The patient's family history includes CAD in her mother; Colon cancer in her father; Heart failure in her mother; Hypertension in her mother. ROS:   Please see the history of present illness.    All 14 point review of systems negative except as described per history of present illness  EKGs/Labs/Other Studies Reviewed:      Recent Labs: 01/13/2019: Magnesium 2.0 01/14/2019: Hemoglobin 16.6; Platelets 285 01/15/2019: ALT 47; TSH 0.993 01/31/2019: BUN 20; Creatinine, Ser 1.28; Potassium 4.4; Sodium 144  Recent Lipid Panel    Component Value Date/Time   CHOL 200 01/15/2019 0309   TRIG 74 01/15/2019 0309    HDL 42 01/15/2019 0309   CHOLHDL 4.8 01/15/2019 0309   VLDL 15 01/15/2019 0309   LDLCALC 143 (H) 01/15/2019 0309    Physical Exam:    VS:  BP 120/70   Pulse 88   Ht 5\' 8"  (1.727 m)   Wt 234 lb (106.1 kg)   SpO2 96%   BMI 35.58 kg/m     Wt Readings from Last 3 Encounters:  02/09/19 234 lb (106.1 kg)  01/20/19 239 lb (108.4 kg)  01/15/19 233 lb 7.5 oz (105.9 kg)     GEN:  Well nourished, well developed in no acute distress HEENT: Normal NECK: No JVD; No carotid bruits LYMPHATICS: No  lymphadenopathy CARDIAC: RRR, no murmurs, no rubs, no gallops RESPIRATORY:  Clear to auscultation without rales, wheezing or rhonchi  ABDOMEN: Soft, non-tender, non-distended MUSCULOSKELETAL:  No edema; No deformity  SKIN: Warm and dry LOWER EXTREMITIES: no swelling NEUROLOGIC:  Alert and oriented x 3 PSYCHIATRIC:  Normal affect   ASSESSMENT:    1. Essential hypertension   2. Nonischemic cardiomyopathy [facility](HCC) cardiac catheterization May 2020 showed normal coronaries, ejection fraction 30%   3. Chronic combined systolic and diastolic congestive heart failure, NYHA class 2 (HCC)   4. Mixed hyperlipidemia    PLAN:    In order of problems listed above:  1. Essential hypertension blood pressure actually is low now. 2. Nonischemic cardiomyopathy with ejection fraction 30%.  We tried to put her on right medications.  She will require frequent visit initially until we get medication straight.  In the meantime she is on Coreg I will initiate Entresto she is also on Aldactone.  She takes diuretic in form of furosemide to help with congestion.  Overall looks euvolemic on physical exam her dry weight is 220 pounds according to her scale. 3. Left bundle branch block.  I initiated conversation about potential need for biventricular pacing and ICD.  She preferred to think about it and let me know in the future if she wants to proceed that way with her left bundle branch block and significantly diminished  ejection fraction there is a role for biopsy pacing however will a try to augment medical therapy and see if there is any improvement in the ejection fraction. 4. Dyslipidemia.  We will continue atorvastatin 10 mg daily.   Medication Adjustments/Labs and Tests Ordered: Current medicines are reviewed at length with the patient today.  Concerns regarding medicines are outlined above.  No orders of the defined types were placed in this encounter.  Medication changes:  Meds ordered this encounter  Medications  . sacubitril-valsartan (ENTRESTO) 24-26 MG    Sig: Take 1 tablet by mouth 2 (two) times daily.    Dispense:  60 tablet    Refill:  2    Signed, Georgeanna Leaobert J. Katalia Choma, MD, [facility] 02/09/2019 9:32 AM    [facility]

## 2019-02-16 ENCOUNTER — Encounter: Payer: Self-pay | Admitting: Cardiology

## 2019-02-16 ENCOUNTER — Ambulatory Visit (INDEPENDENT_AMBULATORY_CARE_PROVIDER_SITE_OTHER): Payer: BLUE CROSS/BLUE SHIELD | Admitting: Cardiology

## 2019-02-16 ENCOUNTER — Other Ambulatory Visit: Payer: Self-pay

## 2019-02-16 VITALS — BP 108/60 | HR 90 | Ht 68.0 in | Wt 239.0 lb

## 2019-02-16 DIAGNOSIS — I428 Other cardiomyopathies: Secondary | ICD-10-CM

## 2019-02-16 DIAGNOSIS — I10 Essential (primary) hypertension: Secondary | ICD-10-CM

## 2019-02-16 DIAGNOSIS — J9801 Acute bronchospasm: Secondary | ICD-10-CM

## 2019-02-16 DIAGNOSIS — J3089 Other allergic rhinitis: Secondary | ICD-10-CM | POA: Insufficient documentation

## 2019-02-16 HISTORY — DX: Other allergic rhinitis: J30.89

## 2019-02-16 HISTORY — DX: Acute bronchospasm: J98.01

## 2019-02-16 MED ORDER — CARVEDILOL 6.25 MG PO TABS: 6.2500 mg | ORAL_TABLET | Freq: Two times a day (BID) | ORAL | 1 refills | Status: DC

## 2019-02-16 NOTE — Addendum Note (Signed)
Addended by: Linna Hoff R on: 02/16/2019 04:00 PM   Modules accepted: Orders

## 2019-02-16 NOTE — Progress Notes (Signed)
Cardiology Office Note:    Date:  02/16/2019   ID:  Breanna Bennett, DOB Dec 20, 1955, MRN [id number]  PCP:  Laurel Dimmer, FNP  Cardiologist:  Gypsy Balsam, MD    Referring MD: Selena Batten *   Chief Complaint  Patient presents with  . Follow-up  And feeling well  History of Present Illness:    Breanna Bennett is a 63 y.o. female  with history ofHTN, GERD, HLD, obesity, pre-DM, recent diagnosis of NICM/systolic CHF, moderate-severe mitral regurgitation, moderate-severe TR, LBBB, renal insufficiency who presents for post-hospital f/u. She was recently admitted with several-week hx of abdominal distention, SOB, edema. 2D echo at outside hospital showed LVEF 25-30% with severely dilated LV, moderate-severe MR, moderate-severe TR. EKG with NSR with LBBB. She had a brief episode of possible atrial flutter noted on telemetry. R/LHC showed no significant CAD (per review with Dr. Clifton James on 5/22), felt to represent NICM, LVEDP . There was question of atrial flutter but Dr. Antoine Poche reviewed available information and saw sinus tach only. She had mild renal insufficiency with Cr up to 1.4 (new, previously 0.9) which improved to 1.02 by DC. Recently admitted to [facility] because of atypical chest pain on the right side of her chest and shoulder interestingly she had minimal raise of troponin I.  3 weeks before the presentation to hospital she had cardiac catheterization which showed normal coronaries.  Overall she is doing well she is coming today to my office for follow-up. She comes today to my office for follow-up doing well she is happy and cheerful today she said she feels good.  No chest pain tightness squeezing pressure burning chest.  According to her scale she is feeling good.  I wanted to switch her from losartan to [facility] however she refused to do that she is scared of it.  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  She however kindly agreed to increase dose of Coreg I will switch her to 6.25 Coreg twice daily.  Chem-7 will be done today and then will decide if we can increase dose of losartan.  Will continue rest of the medication the goal will be trying to increase dosages of medications and then recheck her left ventricular ejection fraction if there is no improvement ICD with biventricular pacing will be warranted her QRS complex duration is 210 she does have left bundle branch block she would be perfect candidate for biventricular pacing  Past Medical History:  Diagnosis Date  . Chronic systolic CHF (congestive heart failure) (HCC)   . Elevated hemoglobin (HCC)   . GERD (gastroesophageal reflux disease)   . Hyperlipidemia   . Hypertension   . LBBB (left bundle branch block)   . Mild renal insufficiency   . Mitral regurgitation   . NICM (nonischemic cardiomyopathy) (HCC)   . Pre-diabetes   . Tricuspid regurgitation     Past Surgical History:  Procedure Laterality Date  . RIGHT/LEFT HEART CATH AND CORONARY ANGIOGRAPHY N/A 01/13/2019   Procedure: RIGHT/LEFT HEART CATH AND CORONARY ANGIOGRAPHY;  Surgeon: Runell Gess, MD;  Location: [facility];  Service: Cardiovascular;  Laterality: N/A;  . TUBAL LIGATION  Current Medications: Current Meds  Medication Sig  . albuterol (VENTOLIN HFA) 108 (90 Base) MCG/ACT inhaler Inhale 2 puffs into the lungs every 4 (four) hours as needed for shortness of breath.  . ASPIRIN 81 PO Take 1 tablet by mouth daily.  Marland Kitchen. atorvastatin (LIPITOR) 10 MG tablet Take 10 mg by mouth daily.  . benzonatate (TESSALON) 100 MG capsule Take 100 mg by mouth as needed for cough.  . carvedilol (COREG) 3.125 MG tablet  Take 3.125 mg by mouth 2 (two) times daily with a meal.  . famotidine (PEPCID) 40 MG tablet Take 1 tablet (40 mg total) by mouth daily.  . fluticasone (FLONASE) 50 MCG/ACT nasal spray Place 2 sprays into both nostrils daily.  . furosemide (LASIX) 40 MG tablet Take 1/2 tablet three times a week  . loratadine (CLARITIN) 10 MG tablet Take 10 mg by mouth daily.  Marland Kitchen. omeprazole (PRILOSEC) 20 MG capsule Take 20 mg by mouth daily.  Marland Kitchen. spironolactone (ALDACTONE) 25 MG tablet Take 0.5 tablets (12.5 mg total) by mouth daily.     Allergies:   Patient has no known allergies.   Social History   Socioeconomic History  . Marital status: Unknown    Spouse name: Not on file  . Number of children: Not on file  . Years of education: Not on file  . Highest education level: Not on file  Occupational History  . Not on file  Social Needs  . Financial resource strain: Not on file  . Food insecurity    Worry: Not on file    Inability: Not on file  . Transportation needs    Medical: Not on file    Non-medical: Not on file  Tobacco Use  . Smoking status: Never Smoker  . Smokeless tobacco: Never Used  Substance and Sexual Activity  . Alcohol use: Never    Frequency: Never  . Drug use: Never  . Sexual activity: Not on file  Lifestyle  . Physical activity    Days per week: Not on file    Minutes per session: Not on file  . Stress: Not on file  Relationships  . Social Musicianconnections    Talks on phone: Not on file    Gets together: Not on file    Attends religious service: Not on file    Active member of club or organization: Not on file    Attends meetings of clubs or organizations: Not on file    Relationship status: Not on file  Other Topics Concern  . Not on file  Social History Narrative  . Not on file     Family History: The patient's family history includes CAD in her mother; Colon cancer in her father; Heart failure in her mother; Hypertension in her mother. ROS:   Please see the history  of present illness.    All 14 point review of systems negative except as described per history of present illness  EKGs/Labs/Other Studies Reviewed:      Recent Labs: 01/13/2019: Magnesium 2.0 01/14/2019: Hemoglobin 16.6; Platelets 285 01/15/2019: ALT 47; TSH 0.993 01/31/2019: BUN 20; Creatinine, Ser 1.28; Potassium 4.4; Sodium 144  Recent Lipid Panel    Component Value Date/Time   CHOL 200 01/15/2019 0309   TRIG 74 01/15/2019 0309   HDL 42 01/15/2019 0309   CHOLHDL 4.8 01/15/2019 0309   VLDL 15 01/15/2019 0309   LDLCALC 143 (H) 01/15/2019 0309    Physical Exam:    VS:  BP 108/60   Pulse  90   Ht 5\' 8"  (1.727 m)   Wt 239 lb (108.4 kg)   SpO2 98%   BMI 36.34 kg/m     Wt Readings from Last 3 Encounters:  02/16/19 239 lb (108.4 kg)  02/09/19 234 lb (106.1 kg)  01/20/19 239 lb (108.4 kg)     GEN:  Well nourished, well developed in no acute distress HEENT: Normal NECK: No JVD; No carotid bruits LYMPHATICS: No lymphadenopathy CARDIAC: RRR, no murmurs, no rubs, no gallops RESPIRATORY:  Clear to auscultation without rales, wheezing or rhonchi  ABDOMEN: Soft, non-tender, non-distended MUSCULOSKELETAL:  No edema; No deformity  SKIN: Warm and dry LOWER EXTREMITIES: no swelling NEUROLOGIC:  Alert and oriented x 3 PSYCHIATRIC:  Normal affect   ASSESSMENT:    1. Nonischemic cardiomyopathy [facility]) cardiac catheterization May 2020 showed normal coronaries, ejection fraction 30%   2. Essential hypertension   3. Mixed hyperlipidemia    PLAN:    In order of problems listed above:  1. Nonischemic cardiomyopathy trying to put on the right medication so we faced unusual obstacle.  She refused to take [facility] because she is scared of angioedema.  We will try to go up with the losartan however today I will increase dose of Coreg her EKG done today showed normal sinus rhythm normal P interval left bundle branch block which is chronic. 2. Essential hypertension blood pressure controlled  continue present management 3. Mixed dyslipidemia continue with statin.   Medication Adjustments/Labs and Tests Ordered: Current medicines are reviewed at length with the patient today.  Concerns regarding medicines are outlined above.  No orders of the defined types were placed in this encounter.  Medication changes: No orders of the defined types were placed in this encounter.   Signed, Park Liter, MD, [facility] 02/16/2019 3:13 PM    [place]

## 2019-02-16 NOTE — Patient Instructions (Signed)
Medication Instructions:  Your physician has recommended you make the following change in your medication:   Increase: Coreg to 6.25 mg twice daily.   If you need a refill on your cardiac medications before your next appointment, please call your pharmacy.   Lab work: Your physician recommends that you return for lab work today: bmp, bnp   If you have labs (blood work) drawn today and your tests are completely normal, you will receive your results only by: Marland Kitchen MyChart Message (if you have MyChart) OR . A paper copy in the mail If you have any lab test that is abnormal or we need to change your treatment, we will call you to review the results.  Testing/Procedures: None.   Follow-Up: At [facility], you and your health needs are our priority.  As part of our continuing mission to provide you with exceptional heart care, we have created designated Provider Care Teams.  These Care Teams include your primary Cardiologist (physician) and Advanced Practice Providers (APPs -  Physician Assistants and Nurse Practitioners) who all work together to provide you with the care you need, when you need it. You will need a follow up appointment in 2 weeks.  Please call our office 2 months in advance to schedule this appointment.  You may see Lauree Chandler, MD or another member of our [place] Provider Team in [place]: Shirlee More, MD . Jyl Heinz, MD  Any Other Special Instructions Will Be Listed Below (If Applicable).

## 2019-02-17 LAB — BASIC METABOLIC PANEL
BUN/Creatinine Ratio: 14 (ref 12–28)
BUN: 16 mg/dL (ref 8–27)
CO2: 24 mmol/L (ref 20–29)
Calcium: 9.6 mg/dL (ref 8.7–10.3)
Chloride: 106 mmol/L (ref 96–106)
Creatinine, Ser: 1.12 mg/dL — ABNORMAL HIGH (ref 0.57–1.00)
GFR calc Af Amer: 60 mL/min/{1.73_m2} (ref >59)
GFR calc non Af Amer: 52 mL/min/{1.73_m2} — ABNORMAL LOW (ref >59)
Glucose: 160 mg/dL — ABNORMAL HIGH (ref 65–99)
Potassium: 4.4 mmol/L (ref 3.5–5.2)
Sodium: 146 mmol/L — ABNORMAL HIGH (ref 134–144)

## 2019-02-17 LAB — PRO B NATRIURETIC PEPTIDE: NT-Pro BNP: 7906 pg/mL — ABNORMAL HIGH (ref 0–287)

## 2019-02-22 ENCOUNTER — Telehealth: Payer: Self-pay | Admitting: Emergency Medicine

## 2019-02-22 NOTE — Telephone Encounter (Signed)
Left message for patient to return call regarding results  

## 2019-02-22 NOTE — Telephone Encounter (Signed)
Patient's daughter called and informed of updated medications they are appropriate in the chart now. Will inform Dr. Agustin Cree.

## 2019-02-22 NOTE — Addendum Note (Signed)
Addended by: Ashok Norris on: 02/22/2019 12:25 PM   Modules accepted: Orders

## 2019-03-02 ENCOUNTER — Ambulatory Visit (INDEPENDENT_AMBULATORY_CARE_PROVIDER_SITE_OTHER): Payer: BLUE CROSS/BLUE SHIELD | Admitting: Cardiology

## 2019-03-02 ENCOUNTER — Other Ambulatory Visit: Payer: Self-pay

## 2019-03-02 VITALS — BP 130/68 | HR 90 | Ht 68.0 in | Wt 237.4 lb

## 2019-03-02 DIAGNOSIS — E782 Mixed hyperlipidemia: Secondary | ICD-10-CM | POA: Diagnosis not present

## 2019-03-02 DIAGNOSIS — Z6838 Body mass index (BMI) 38.0-38.9, adult: Secondary | ICD-10-CM

## 2019-03-02 DIAGNOSIS — I10 Essential (primary) hypertension: Secondary | ICD-10-CM

## 2019-03-02 DIAGNOSIS — I428 Other cardiomyopathies: Secondary | ICD-10-CM

## 2019-03-02 MED ORDER — LOSARTAN POTASSIUM 25 MG PO TABS: ORAL_TABLET | ORAL | 1 refills | Status: DC

## 2019-03-02 NOTE — Patient Instructions (Signed)
Medication Instructions:  Your physician has recommended you make the following change in your medication:   CHANGE : Losartan 25 mg: Take 2 tabs (50 mg) in the AM and 1 Tab (25 mg) in the PM  If you need a refill on your cardiac medications before your next appointment, please call your pharmacy.   Lab work: Your physician recommends that you return for lab work in: 1 week BMP  If you have labs (blood work) drawn today and your tests are completely normal, you will receive your results only by: Marland Kitchen MyChart Message (if you have MyChart) OR . A paper copy in the mail If you have any lab test that is abnormal or we need to change your treatment, we will call you to review the results.  Testing/Procedures: NOne  Follow-Up: At [facility], you and your health needs are our priority.  As part of our continuing mission to provide you with exceptional heart care, we have created designated Provider Care Teams.  These Care Teams include your primary Cardiologist (physician) and Advanced Practice Providers (APPs -  Physician Assistants and Nurse Practitioners) who all work together to provide you with the care you need, when you need it. You will need a follow up appointment in 2 - 3 weeks.  Any Other Special Instructions Will Be Listed Below (If Applicable).

## 2019-03-03 NOTE — Progress Notes (Signed)
.  Cardiology Office Note:    Date:  03/03/2019   ID:  Breanna Bennett, DOB Oct 04, 1955, MRN [id number]  PCP:  Laurel DimmerKruppenbach, Katherine H, FNP  Cardiologist:  Gypsy Balsamobert Krasowski, MD    Referring MD: Selena BattenKruppenbach, Katherine *   Chief Complaint  Patient presents with  . Follow-up  Doing well  History of Present Illness:    Breanna Bennett is a 63 y.o. female with history of cardiomyopathy, left bundle branch block, congestive heart failure.  [employer] [place] [employer] Association 2/3.  Gradually tried to put on right medication the purpose of the visit today is to do exactly that.  She is been taking over the beta-blocker, last Chem-7 was acceptable.  I will increase dose of losartan.  [facility] and we spoke again about that she is still reluctant to do that hopefully he accepted losartan which I will increase the dose.  Overall clinically doing well.  She was later today and the visit she had difficulty finding our [place] [facility]  Past Medical History:  Diagnosis Date  . Chronic systolic CHF (congestive heart failure) (HCC)   . Elevated hemoglobin (HCC)   . GERD (gastroesophageal reflux disease)   . Hyperlipidemia   . Hypertension   . LBBB (left bundle branch block)   . Mild renal insufficiency   . Mitral regurgitation   . NICM (nonischemic cardiomyopathy) (HCC)   . Pre-diabetes   . Tricuspid regurgitation     Past Surgical History:  Procedure Laterality Date  . RIGHT/LEFT HEART CATH AND CORONARY ANGIOGRAPHY N/A 01/13/2019   Procedure: RIGHT/LEFT HEART CATH AND CORONARY ANGIOGRAPHY;  Surgeon: Runell GessBerry, Jonathan J, MD;  [facility];  Service: Cardiovascular;  Laterality: N/A;  . TUBAL LIGATION      Current Medications: Current Meds  Medication Sig  . albuterol (VENTOLIN HFA) 108 (90 Base) MCG/ACT inhaler Inhale 2 puffs into the lungs every 4 (four) hours as needed for shortness of breath.  . ASPIRIN 81 PO Take 1 tablet by mouth daily.  Marland Kitchen.  atorvastatin (LIPITOR) 10 MG tablet Take 10 mg by mouth daily.  . benzonatate (TESSALON) 100 MG capsule Take 100 mg by mouth as needed for cough.  . carvedilol (COREG) 6.25 MG tablet Take 1 tablet (6.25 mg total) by mouth 2 (two) times daily with a meal.  . famotidine (PEPCID) 40 MG tablet Take 1 tablet (40 mg total) by mouth daily.  . fluticasone (FLONASE) 50 MCG/ACT nasal spray Place 2 sprays into both nostrils daily.  . furosemide (LASIX) 40 MG tablet Take 1/2 tablet three times a week (Patient taking differently: 40 mg. Take 1/2 tablet daily.)  . loratadine (CLARITIN) 10 MG tablet Take 10 mg by mouth daily.  Marland Kitchen. losartan (COZAAR) 25 MG tablet Take 2 tabs(50 mg) in the AM and take 1 Tab (25mg ) in the PM  . spironolactone (ALDACTONE) 25 MG tablet Take 0.5 tablets (12.5 mg total) by mouth daily.  . [DISCONTINUED] losartan (COZAAR) 25 MG tablet Take 25 mg by mouth 2 (two) times daily.     Allergies:   Patient has no known allergies.   Social History   Socioeconomic History  . Marital status: Unknown    Spouse name: Not on file  . Number of children: Not on file  . Years of education: Not on file  . Highest education level: Not on file  Occupational History  . Not on file  Social Needs  . Financial resource strain: Not  on file  . Food insecurity    Worry: Not on file    Inability: Not on file  . Transportation needs    Medical: Not on file    Non-medical: Not on file  Tobacco Use  . Smoking status: Never Smoker  . Smokeless tobacco: Never Used  Substance and Sexual Activity  . Alcohol use: Never    Frequency: Never  . Drug use: Never  . Sexual activity: Not on file  Lifestyle  . Physical activity    Days per week: Not on file    Minutes per session: Not on file  . Stress: Not on file  Relationships  . Social Herbalist on phone: Not on file    Gets together: Not on file    Attends religious service: Not on file    Active member of club or organization: Not on  file    Attends meetings of clubs or organizations: Not on file    Relationship status: Not on file  Other Topics Concern  . Not on file  Social History Narrative  . Not on file     Family History: The patient's family history includes CAD in her mother; Colon cancer in her father; Heart failure in her mother; Hypertension in her mother. ROS:   Please see the history of present illness.    All 14 point review of systems negative except as described per history of present illness  EKGs/Labs/Other Studies Reviewed:      Recent Labs: 01/13/2019: Magnesium 2.0 01/14/2019: Hemoglobin 16.6; Platelets 285 01/15/2019: ALT 47; TSH 0.993 02/16/2019: BUN 16; Creatinine, Ser 1.12; NT-Pro BNP 7,906; Potassium 4.4; Sodium 146  Recent Lipid Panel    Component Value Date/Time   CHOL 200 01/15/2019 0309   TRIG 74 01/15/2019 0309   HDL 42 01/15/2019 0309   CHOLHDL 4.8 01/15/2019 0309   VLDL 15 01/15/2019 0309   LDLCALC 143 (H) 01/15/2019 0309    Physical Exam:    VS:  BP 130/68   Pulse 90   Ht 5\' 8"  (1.727 m)   Wt 237 lb 6.4 oz (107.7 kg)   SpO2 98%   BMI 36.10 kg/m     Wt Readings from Last 3 Encounters:  03/02/19 237 lb 6.4 oz (107.7 kg)  02/16/19 239 lb (108.4 kg)  02/09/19 234 lb (106.1 kg)     GEN:  Well nourished, well developed in no acute distress HEENT: Normal NECK: No JVD; No carotid bruits LYMPHATICS: No lymphadenopathy CARDIAC: RRR, no murmurs, no rubs, no gallops RESPIRATORY:  Clear to auscultation without rales, wheezing or rhonchi  ABDOMEN: Soft, non-tender, non-distended MUSCULOSKELETAL:  No edema; No deformity  SKIN: Warm and dry LOWER EXTREMITIES: no swelling NEUROLOGIC:  Alert and oriented x 3 PSYCHIATRIC:  Normal affect   ASSESSMENT:    1. Nonischemic cardiomyopathy [facility]) cardiac catheterization May 2020 showed normal coronaries, ejection fraction 30%   2. Essential hypertension   3. Mixed hyperlipidemia   4. Class 2 severe obesity due to excess  calories with serious comorbidity and body mass index (BMI) of 38.0 to 38.9 in adult [facility])    PLAN:    In order of problems listed above:  1. Nonischemic cardiomyopathy gradually trying to put her on appropriate medications and after appropriate therapy will consider by V ICD. 2. Essential hypertension blood pressure appears well-controlled continue present management 3. Dyslipidemia continue with statin. 4. Obesity noted.   Medication Adjustments/Labs and Tests Ordered: Current medicines are reviewed at  length with the patient today.  Concerns regarding medicines are outlined above.  No orders of the defined types were placed in this encounter.  Medication changes:  Meds ordered this encounter  Medications  . losartan (COZAAR) 25 MG tablet    Sig: Take 2 tabs(50 mg) in the AM and take 1 Tab (25mg ) in the PM    Dispense:  90 tablet    Refill:  1    Signed, Georgeanna Lea, MD, [facility] 03/03/2019 8:09 AM    [facility]

## 2019-03-24 ENCOUNTER — Other Ambulatory Visit: Payer: Self-pay | Admitting: Physician Assistant

## 2019-03-24 MED ORDER — SPIRONOLACTONE 25 MG PO TABS: 12.5000 mg | ORAL_TABLET | Freq: Every day | ORAL | 3 refills | Status: DC

## 2019-03-24 NOTE — Telephone Encounter (Signed)
Pt's medication was sent to pt's pharmacy as requested. Confirmation received.  °

## 2019-03-25 ENCOUNTER — Other Ambulatory Visit: Payer: Self-pay

## 2019-03-25 ENCOUNTER — Ambulatory Visit: Payer: BLUE CROSS/BLUE SHIELD | Admitting: Cardiology

## 2019-03-25 ENCOUNTER — Ambulatory Visit (INDEPENDENT_AMBULATORY_CARE_PROVIDER_SITE_OTHER): Payer: BLUE CROSS/BLUE SHIELD | Admitting: Cardiology

## 2019-03-25 ENCOUNTER — Encounter: Payer: Self-pay | Admitting: Cardiology

## 2019-03-25 VITALS — BP 110/60 | HR 87 | Ht 68.0 in | Wt 240.0 lb

## 2019-03-25 DIAGNOSIS — I10 Essential (primary) hypertension: Secondary | ICD-10-CM | POA: Diagnosis not present

## 2019-03-25 DIAGNOSIS — E782 Mixed hyperlipidemia: Secondary | ICD-10-CM

## 2019-03-25 DIAGNOSIS — I5042 Chronic combined systolic (congestive) and diastolic (congestive) heart failure: Secondary | ICD-10-CM | POA: Diagnosis not present

## 2019-03-25 DIAGNOSIS — I428 Other cardiomyopathies: Secondary | ICD-10-CM

## 2019-03-25 MED ORDER — LISINOPRIL 5 MG PO TABS: 5.0000 mg | ORAL_TABLET | Freq: Every day | ORAL | 1 refills | Status: DC

## 2019-03-25 NOTE — Addendum Note (Signed)
Addended by: Ashok Norris on: 03/25/2019 02:11 PM   Modules accepted: Orders

## 2019-03-25 NOTE — Addendum Note (Signed)
Addended by: Ashok Norris on: 03/25/2019 02:51 PM   Modules accepted: Orders

## 2019-03-25 NOTE — Progress Notes (Signed)
Cardiology Office Note:    Date:  03/25/2019   ID:  Breanna Bennett, DOB [birth year], MRN [id number]  PCP:  Laurel Dimmer, FNP  Cardiologist:  Gypsy Balsam, MD    Referring MD: Selena Batten *   Chief Complaint  Patient presents with  . 1 month follow up  I am doing well  History of Present Illness:    Breanna Bennett is a 63 y.o. female  history ofHTN, GERD, HLD, obesity, pre-DM, recent diagnosis of NICM/systolic CHF, moderate-severe mitral regurgitation, moderate-severe TR, LBBB, renal insufficiency who presents for post-hospital f/u. She was recently admitted with several-week hx of abdominal distention, SOB, edema. 2D echo at outside hospital showed LVEF 25-30% with severely dilated LV, moderate-severe MR, moderate-severe TR. EKG with NSR with LBBB. She had a brief episode of possible atrial flutter noted on telemetry. R/LHC showed no significant CAD (per review with Dr. Clifton James on 5/22), felt to represent NICM, LVEDP . There was question of atrial flutter but Dr. Antoine Poche reviewed available information and saw sinus tach only. She had mild renal insufficiency with Cr up to 1.4 (new, previously 0.9) which improved to 1.02 by DC. She comes today to follow-up on the therapy and augment her therapy hopefully.  She complaining of losartan she said losartan gave her some belly aches and bloating.  I am not sure exactly if this is the mechanism in which she is getting the symptoms however since she does not want to take [facility] and now it looks like she had difficulty with losartan it may be related to an ARB problem that she is experiencing.  I will switch her to lisinopril.  We will give her 5 mg lisinopril every single day.  Also she does have some swelling of lower extremities that I see today Chem-7 has been taken based on that we decide about future therapy which I anticipate to augment her Lasix.  Past Medical History:  Diagnosis Date  . Chronic systolic CHF  (congestive heart failure) (HCC)   . Elevated hemoglobin (HCC)   . GERD (gastroesophageal reflux disease)   . Hyperlipidemia   . Hypertension   . LBBB (left bundle branch block)   . Mild renal insufficiency   . Mitral regurgitation   . NICM (nonischemic cardiomyopathy) (HCC)   . Pre-diabetes   . Tricuspid regurgitation     Past Surgical History:  Procedure Laterality Date  . RIGHT/LEFT HEART CATH AND CORONARY ANGIOGRAPHY N/A 01/13/2019   Procedure: RIGHT/LEFT HEART CATH AND CORONARY ANGIOGRAPHY;  Surgeon: Runell Gess, MD;  Location: [facility];  Service: Cardiovascular;  Laterality: N/A;  . TUBAL LIGATION      Current Medications: Current Meds  Medication Sig  . albuterol (VENTOLIN HFA) 108 (90 Base) MCG/ACT inhaler Inhale 2 puffs into the lungs every 4 (four) hours as needed for shortness of breath.  . ASPIRIN 81 PO Take 1 tablet by mouth daily.  Marland Kitchen atorvastatin (LIPITOR) 10 MG tablet Take 10 mg by mouth daily.  . benzonatate (TESSALON) 100 MG capsule Take 100 mg by mouth as needed for cough.  . carvedilol (COREG) 6.25 MG tablet Take 1 tablet (6.25 mg total) by mouth 2 (two) times daily with a meal.  . famotidine (PEPCID) 40 MG tablet Take 1 tablet (40 mg total) by mouth daily.  . fluticasone (FLONASE) 50 MCG/ACT nasal spray Place 2 sprays into both nostrils daily.  . furosemide (LASIX) 40 MG tablet Take 1/2 tablet three times a week (Patient taking differently: 40  mg. Take 1/2 tablet daily.)  . loratadine (CLARITIN) 10 MG tablet Take 10 mg by mouth daily.  Marland Kitchen losartan (COZAAR) 25 MG tablet Take 2 tabs(50 mg) in the AM and take 1 Tab (25mg ) in the PM  . spironolactone (ALDACTONE) 25 MG tablet Take 0.5 tablets (12.5 mg total) by mouth daily.     Allergies:   Patient has no known allergies.   Social History   Socioeconomic History  . Marital status: Unknown    Spouse name: Not on file  . Number of children: Not on file  . Years of education: Not on file  .  Highest education level: Not on file  Occupational History  . Not on file  Social Needs  . Financial resource strain: Not on file  . Food insecurity    Worry: Not on file    Inability: Not on file  . Transportation needs    Medical: Not on file    Non-medical: Not on file  Tobacco Use  . Smoking status: Never Smoker  . Smokeless tobacco: Never Used  Substance and Sexual Activity  . Alcohol use: Never    Frequency: Never  . Drug use: Never  . Sexual activity: Not on file  Lifestyle  . Physical activity    Days per week: Not on file    Minutes per session: Not on file  . Stress: Not on file  Relationships  . Social Herbalist on phone: Not on file    Gets together: Not on file    Attends religious service: Not on file    Active member of club or organization: Not on file    Attends meetings of clubs or organizations: Not on file    Relationship status: Not on file  Other Topics Concern  . Not on file  Social History Narrative  . Not on file     Family History: The patient's family history includes CAD in her mother; Colon cancer in her father; Heart failure in her mother; Hypertension in her mother. ROS:   Please see the history of present illness.    All 14 point review of systems negative except as described per history of present illness  EKGs/Labs/Other Studies Reviewed:      Recent Labs: 01/13/2019: Magnesium 2.0 01/14/2019: Hemoglobin 16.6; Platelets 285 01/15/2019: ALT 47; TSH 0.993 02/16/2019: BUN 16; Creatinine, Ser 1.12; NT-Pro BNP 7,906; Potassium 4.4; Sodium 146  Recent Lipid Panel    Component Value Date/Time   CHOL 200 01/15/2019 0309   TRIG 74 01/15/2019 0309   HDL 42 01/15/2019 0309   CHOLHDL 4.8 01/15/2019 0309   VLDL 15 01/15/2019 0309   LDLCALC 143 (H) 01/15/2019 0309    Physical Exam:    VS:  BP 110/60   Pulse 87   Ht 5\' 8"  (1.727 m)   Wt 240 lb (108.9 kg)   SpO2 98%   BMI 36.49 kg/m     Wt Readings from Last 3  Encounters:  03/25/19 240 lb (108.9 kg)  03/02/19 237 lb 6.4 oz (107.7 kg)  02/16/19 239 lb (108.4 kg)     GEN:  Well nourished, well developed in no acute distress HEENT: Normal NECK: No JVD; No carotid bruits LYMPHATICS: No lymphadenopathy CARDIAC: RRR, no murmurs, no rubs, no gallops RESPIRATORY:  Clear to auscultation without rales, wheezing or rhonchi  ABDOMEN: Soft, non-tender, non-distended MUSCULOSKELETAL:  No edema; No deformity  SKIN: Warm and dry LOWER EXTREMITIES: 1+ swelling NEUROLOGIC:  Alert  and oriented x 3 PSYCHIATRIC:  Normal affect   ASSESSMENT:    1. Nonischemic cardiomyopathy [facility](HCC) cardiac catheterization May 2020 showed normal coronaries, ejection fraction 30%   2. Essential hypertension   3. Chronic combined systolic and diastolic congestive heart failure, NYHA class 2 (HCC)   4. Mixed hyperlipidemia    PLAN:    In order of problems listed above:  1. Nonischemic cardiomyopathy ejection fraction 30% I am gradually trying to put on the right medication that seems to be difficult.  She got difficulty tolerating Entresto now losartan.  We will switch her to lisinopril.  So should be taking Aldactone and lisinopril as well as Coreg 2. Essential hypertension blood pressure controlled continue present management 3. Mixed dyslipidemia continue with Lipitor 10.   Medication Adjustments/Labs and Tests Ordered: Current medicines are reviewed at length with the patient today.  Concerns regarding medicines are outlined above.  No orders of the defined types were placed in this encounter.  Medication changes: No orders of the defined types were placed in this encounter.   Signed, Georgeanna Leaobert J. Carina Chaplin, MD, [facility] 03/25/2019 2:41 PM    [facility]

## 2019-03-25 NOTE — Patient Instructions (Signed)
Medication Instructions:  Your physician has recommended you make the following change in your medication:   STOP: Losartan  Start: Lisinopril 5 mg daily   If you need a refill on your cardiac medications before your next appointment, please call your pharmacy.   Lab work: None.  If you have labs (blood work) drawn today and your tests are completely normal, you will receive your results only by: Marland Kitchen. MyChart Message (if you have MyChart) OR . A paper copy in the mail If you have any lab test that is abnormal or we need to change your treatment, we will call you to review the results.  Testing/Procedures: None.   Follow-Up: At [facility], you and your health needs are our priority.  As part of our continuing mission to provide you with exceptional heart care, we have created designated Provider Care Teams.  These Care Teams include your primary Cardiologist (physician) and Advanced Practice Providers (APPs -  Physician Assistants and Nurse Practitioners) who all work together to provide you with the care you need, when you need it. You will need a follow up appointment in 1 months.  Please call our office 2 months in advance to schedule this appointment.  You may see Verne Carrowhristopher McAlhany, MD or another member of our [facility] Provider Team in [place]: Norman HerrlichBrian Munley, MD . Belva Cromeajan Revankar, MD  Any Other Special Instructions Will Be Listed Below (If Applicable).  Lisinopril tablets What is this medicine? LISINOPRIL (lyse IN oh pril) is an ACE inhibitor. This medicine is used to treat high blood pressure and heart failure. It is also used to protect the heart immediately after a heart attack. This medicine may be used for other purposes; ask your health care provider or pharmacist if you have questions. COMMON BRAND NAME(S): Prinivil, Zestril What should I tell my health care provider before I take this medicine? They need to know if you have any of these  conditions:  diabetes  heart or blood vessel disease  kidney disease  low blood pressure  previous swelling of the tongue, face, or lips with difficulty breathing, difficulty swallowing, hoarseness, or tightening of the throat  an unusual or allergic reaction to lisinopril, other ACE inhibitors, insect venom, foods, dyes, or preservatives  pregnant or trying to get pregnant  breast-feeding How should I use this medicine? Take this medicine by mouth with a glass of water. Follow the directions on your prescription label. You may take this medicine with or without food. If it upsets your stomach, take it with food. Take your medicine at regular intervals. Do not take it more often than directed. Do not stop taking except on your doctor's advice. Talk to your pediatrician regarding the use of this medicine in children. Special care may be needed. While this drug may be prescribed for children as young as 376 years of age for selected conditions, precautions do apply. Overdosage: If you think you have taken too much of this medicine contact a poison control center or emergency room at once. NOTE: This medicine is only for you. Do not share this medicine with others. What if I miss a dose? If you miss a dose, take it as soon as you can. If it is almost time for your next dose, take only that dose. Do not take double or extra doses. What may interact with this medicine? Do not take this medicine with any of the following medications:  hymenoptera venom  sacubitril; valsartan This medicines may also interact with  the following medications:  aliskiren  angiotensin receptor blockers, like losartan or valsartan  certain medicines for diabetes  diuretics  everolimus  gold compounds  lithium  NSAIDs, medicines for pain and inflammation, like ibuprofen or naproxen  potassium salts or supplements  salt substitutes  sirolimus  temsirolimus This list may not describe all possible  interactions. Give your health care provider a list of all the medicines, herbs, non-prescription drugs, or dietary supplements you use. Also tell them if you smoke, drink alcohol, or use illegal drugs. Some items may interact with your medicine. What should I watch for while using this medicine? Visit your doctor or health care professional for regular check ups. Check your blood pressure as directed. Ask your doctor what your blood pressure should be, and when you should contact him or her. Do not treat yourself for coughs, colds, or pain while you are using this medicine without asking your doctor or health care professional for advice. Some ingredients may increase your blood pressure. Women should inform their doctor if they wish to become pregnant or think they might be pregnant. There is a potential for serious side effects to an unborn child. Talk to your health care professional or pharmacist for more information. Check with your doctor or health care professional if you get an attack of severe diarrhea, nausea and vomiting, or if you sweat a lot. The loss of too much body fluid can make it dangerous for you to take this medicine. You may get drowsy or dizzy. Do not drive, use machinery, or do anything that needs mental alertness until you know how this drug affects you. Do not stand or sit up quickly, especially if you are an older patient. This reduces the risk of dizzy or fainting spells. Alcohol can make you more drowsy and dizzy. Avoid alcoholic drinks. Avoid salt substitutes unless you are told otherwise by your doctor or health care professional. What side effects may I notice from receiving this medicine? Side effects that you should report to your doctor or health care professional as soon as possible:  allergic reactions like skin rash, itching or hives, swelling of the hands, feet, face, lips, throat, or tongue  breathing problems  signs and symptoms of kidney injury like trouble  passing urine or change in the amount of urine  signs and symptoms of increased potassium like muscle weakness; chest pain; or fast, irregular heartbeat  signs and symptoms of liver injury like dark yellow or brown urine; general ill feeling or flu-like symptoms; light-colored stools; loss of appetite; nausea; right upper belly pain; unusually weak or tired; yellowing of the eyes or skin  signs and symptoms of low blood pressure like dizziness; feeling faint or lightheaded, falls; unusually weak or tired  stomach pain with or without nausea and vomiting Side effects that usually do not require medical attention (report to your doctor or health care professional if they continue or are bothersome):  changes in taste  cough  dizziness  fever  headache  sensitivity to light This list may not describe all possible side effects. Call your doctor for medical advice about side effects. You may report side effects to FDA at 1-800-FDA-1088. Where should I keep my medicine? Keep out of the reach of children. Store at room temperature between 15 and 30 degrees C (59 and 86 degrees F). Protect from moisture. Keep container tightly closed. Throw away any unused medicine after the expiration date. NOTE: This sheet is a summary. It may not cover  all possible information. If you have questions about this medicine, talk to your doctor, pharmacist, or health care provider.  2020 Elsevier/Gold Standard (2015-10-01 12:52:35)

## 2019-03-26 LAB — BASIC METABOLIC PANEL
BUN/Creatinine Ratio: 15 (ref 12–28)
BUN: 15 mg/dL (ref 8–27)
CO2: 17 mmol/L — ABNORMAL LOW (ref 20–29)
Calcium: 9.3 mg/dL (ref 8.7–10.3)
Chloride: 106 mmol/L (ref 96–106)
Creatinine, Ser: 0.97 mg/dL (ref 0.57–1.00)
GFR calc Af Amer: 72 mL/min/{1.73_m2} (ref >59)
GFR calc non Af Amer: 62 mL/min/{1.73_m2} (ref >59)
Glucose: 147 mg/dL — ABNORMAL HIGH (ref 65–99)
Potassium: 4 mmol/L (ref 3.5–5.2)
Sodium: 142 mmol/L (ref 134–144)

## 2019-04-11 ENCOUNTER — Telehealth: Payer: Self-pay

## 2019-04-11 DIAGNOSIS — I5021 Acute systolic (congestive) heart failure: Secondary | ICD-10-CM

## 2019-04-11 MED ORDER — LISINOPRIL 10 MG PO TABS: 10.0000 mg | ORAL_TABLET | Freq: Every day | ORAL | 0 refills | Status: DC

## 2019-04-11 NOTE — Telephone Encounter (Signed)
No answer/no voicemail at patient's number. Phoned daughter Breanna Bennett (per DPR). Informed of lab results,  told to have her Mom increase lisinopril to 10mg  daily and come to office to have repeat labs drawn. Verbalized understanding.

## 2019-04-11 NOTE — Telephone Encounter (Signed)
Patient daughter is calling back with more questions.

## 2019-04-11 NOTE — Telephone Encounter (Signed)
Patients daughter called back and reports that the patient is no longer taking lisinopril since August 9th, 2020. She reports this caused her to have a very bad cough. I will inform Dr. Agustin Cree and follow up with daughter. She has asked we call [phone]

## 2019-04-13 ENCOUNTER — Ambulatory Visit: Payer: BLUE CROSS/BLUE SHIELD | Admitting: Cardiology

## 2019-04-14 LAB — BASIC METABOLIC PANEL
BUN/Creatinine Ratio: 16 (ref 12–28)
BUN: 17 mg/dL (ref 8–27)
CO2: 21 mmol/L (ref 20–29)
Calcium: 9.6 mg/dL (ref 8.7–10.3)
Chloride: 104 mmol/L (ref 96–106)
Creatinine, Ser: 1.04 mg/dL — ABNORMAL HIGH (ref 0.57–1.00)
GFR calc Af Amer: 66 mL/min/{1.73_m2} (ref >59)
GFR calc non Af Amer: 57 mL/min/{1.73_m2} — ABNORMAL LOW (ref >59)
Glucose: 133 mg/dL — ABNORMAL HIGH (ref 65–99)
Potassium: 4.1 mmol/L (ref 3.5–5.2)
Sodium: 143 mmol/L (ref 134–144)

## 2019-04-18 ENCOUNTER — Telehealth: Payer: Self-pay | Admitting: Cardiology

## 2019-04-18 DIAGNOSIS — I10 Essential (primary) hypertension: Secondary | ICD-10-CM

## 2019-04-18 NOTE — Telephone Encounter (Signed)
Having a reaction to lisinopril

## 2019-04-18 NOTE — Telephone Encounter (Signed)
Attempted to call patient. No answer. Will continue efforts.  

## 2019-04-19 NOTE — Telephone Encounter (Signed)
Spoke with pt and let her know that kidney is stable and continue same plan. Pt wanted to know if Dr. Raliegh Ip could do anything about swelling in her lower legs. She took compression stockings off 4 days ago and had blisters on legs, stopped wearing them, keeping them clean, put cream on them and they are healing up but thinks stockings may have been to tight. Also she developed cough with the Lisinopril and has taken herself off of this. Note From Jerl Santos, will consult with Dr. Agustin Cree.

## 2019-04-20 NOTE — Telephone Encounter (Signed)
She needs to have f/up visit

## 2019-04-21 NOTE — Telephone Encounter (Signed)
Attempted to call back. Number busy. Will continue efforts.  

## 2019-04-22 LAB — BASIC METABOLIC PANEL
BUN/Creatinine Ratio: 15 (ref 12–28)
BUN: 17 mg/dL (ref 8–27)
CO2: 23 mmol/L (ref 20–29)
Calcium: 9.7 mg/dL (ref 8.7–10.3)
Chloride: 103 mmol/L (ref 96–106)
Creatinine, Ser: 1.16 mg/dL — ABNORMAL HIGH (ref 0.57–1.00)
GFR calc Af Amer: 58 mL/min/{1.73_m2} — ABNORMAL LOW (ref >59)
GFR calc non Af Amer: 50 mL/min/{1.73_m2} — ABNORMAL LOW (ref >59)
Glucose: 150 mg/dL — ABNORMAL HIGH (ref 65–99)
Potassium: 4.4 mmol/L (ref 3.5–5.2)
Sodium: 143 mmol/L (ref 134–144)

## 2019-04-22 NOTE — Telephone Encounter (Signed)
Unable to move up appointment due to booked schedule. Per Dr. Agustin Cree we will have labs drawn and adjust medications in the mean time. Patient informed to have labs drawn as soon as she can in [facility] office. No further questions.

## 2019-04-22 NOTE — Addendum Note (Signed)
Addended by: Ashok Norris on: 04/22/2019 09:41 AM   Modules accepted: Orders

## 2019-04-29 ENCOUNTER — Other Ambulatory Visit: Payer: Self-pay

## 2019-04-29 MED ORDER — FUROSEMIDE 40 MG PO TABS: 20.0000 mg | ORAL_TABLET | Freq: Every day | ORAL | Status: DC

## 2019-05-04 ENCOUNTER — Encounter: Payer: Self-pay | Admitting: Cardiology

## 2019-05-04 ENCOUNTER — Other Ambulatory Visit: Payer: Self-pay

## 2019-05-04 ENCOUNTER — Ambulatory Visit (INDEPENDENT_AMBULATORY_CARE_PROVIDER_SITE_OTHER): Payer: BLUE CROSS/BLUE SHIELD | Admitting: Cardiology

## 2019-05-04 VITALS — BP 110/80 | HR 90 | Ht 68.0 in | Wt 242.2 lb

## 2019-05-04 DIAGNOSIS — I428 Other cardiomyopathies: Secondary | ICD-10-CM

## 2019-05-04 DIAGNOSIS — I10 Essential (primary) hypertension: Secondary | ICD-10-CM

## 2019-05-04 DIAGNOSIS — I5042 Chronic combined systolic (congestive) and diastolic (congestive) heart failure: Secondary | ICD-10-CM | POA: Diagnosis not present

## 2019-05-04 DIAGNOSIS — I340 Nonrheumatic mitral (valve) insufficiency: Secondary | ICD-10-CM | POA: Insufficient documentation

## 2019-05-04 NOTE — Progress Notes (Signed)
Cardiology Office Note:    Date:  05/04/2019   ID:  Breanna Bennett, DOB [birth year], MRN [id number]  PCP:  Laurel Dimmer, FNP  Cardiologist:  Gypsy Balsam, MD    Referring MD: Selena Batten *   Chief Complaint  Patient presents with  . 1 month follow up  Doing fair  History of Present Illness:    Breanna Bennett is a 63 y.o. female with complex past medical history that includes congestive heart failure, cardiomyopathy with diminished ejection fraction severely, mitral regurgitation, left bundle branch block she comes today to our office because of swelling of her lower extremities she was in the hospital some medication has been changed she is not taking ARB anymore only lisinopril because of cost issue her diuretic has been lowered significantly to only 3 times a week I recently over the phone we increased to 20 mg every single day which helps with the swelling of legs but overall still not after the part.  She did have some oozing from her legs now she does not have it.  Described fatigue tiredness.  Still able to walk and move around with some difficulties.  No chest pain  Past Medical History:  Diagnosis Date  . Chronic systolic CHF (congestive heart failure) (HCC)   . Elevated hemoglobin (HCC)   . GERD (gastroesophageal reflux disease)   . Hyperlipidemia   . Hypertension   . LBBB (left bundle branch block)   . Mild renal insufficiency   . Mitral regurgitation   . NICM (nonischemic cardiomyopathy) (HCC)   . Pre-diabetes   . Tricuspid regurgitation     Past Surgical History:  Procedure Laterality Date  . RIGHT/LEFT HEART CATH AND CORONARY ANGIOGRAPHY N/A 01/13/2019   Procedure: RIGHT/LEFT HEART CATH AND CORONARY ANGIOGRAPHY;  Surgeon: Runell Gess, MD;  Location: [facility];  Service: Cardiovascular;  Laterality: N/A;  . TUBAL LIGATION      Current Medications: Current Meds  Medication Sig  . albuterol (VENTOLIN HFA) 108 (90 Base)  MCG/ACT inhaler Inhale 2 puffs into the lungs every 4 (four) hours as needed for shortness of breath.  . ASPIRIN 81 PO Take 1 tablet by mouth daily.  Marland Kitchen atorvastatin (LIPITOR) 10 MG tablet Take 10 mg by mouth daily.  . carvedilol (COREG) 6.25 MG tablet Take 1 tablet (6.25 mg total) by mouth 2 (two) times daily with a meal.  . famotidine (PEPCID) 40 MG tablet Take 1 tablet (40 mg total) by mouth daily.  . fluticasone (FLONASE) 50 MCG/ACT nasal spray Place 2 sprays into both nostrils daily.  . furosemide (LASIX) 40 MG tablet Take 0.5 tablets (20 mg total) by mouth daily. Take 1/2 tablet daily.  Marland Kitchen loratadine (CLARITIN) 10 MG tablet Take 10 mg by mouth daily.  . montelukast (SINGULAIR) 10 MG tablet Take 10 mg by mouth at bedtime.  Marland Kitchen spironolactone (ALDACTONE) 25 MG tablet Take 0.5 tablets (12.5 mg total) by mouth daily.  . Vitamin D, Ergocalciferol, (DRISDOL) 1.25 MG (50000 UT) CAPS capsule Take 1 capsule by mouth once a week.     Allergies:   Lisinopril   Social History   Socioeconomic History  . Marital status: Unknown    Spouse name: Not on file  . Number of children: Not on file  . Years of education: Not on file  . Highest education level: Not on file  Occupational History  . Not on file  Social Needs  . Financial resource strain: Not on file  . Food  insecurity    Worry: Not on file    Inability: Not on file  . Transportation needs    Medical: Not on file    Non-medical: Not on file  Tobacco Use  . Smoking status: Never Smoker  . Smokeless tobacco: Never Used  Substance and Sexual Activity  . Alcohol use: Never    Frequency: Never  . Drug use: Never  . Sexual activity: Not on file  Lifestyle  . Physical activity    Days per week: Not on file    Minutes per session: Not on file  . Stress: Not on file  Relationships  . Social Herbalist on phone: Not on file    Gets together: Not on file    Attends religious service: Not on file    Active member of club or  organization: Not on file    Attends meetings of clubs or organizations: Not on file    Relationship status: Not on file  Other Topics Concern  . Not on file  Social History Narrative  . Not on file     Family History: The patient's family history includes CAD in her mother; Colon cancer in her father; Heart failure in her mother; Hypertension in her mother. ROS:   Please see the history of present illness.    All 14 point review of systems negative except as described per history of present illness  EKGs/Labs/Other Studies Reviewed:      Recent Labs: 01/13/2019: Magnesium 2.0 01/14/2019: Hemoglobin 16.6; Platelets 285 01/15/2019: ALT 47; TSH 0.993 02/16/2019: NT-Pro BNP 7,906 04/22/2019: BUN 17; Creatinine, Ser 1.16; Potassium 4.4; Sodium 143  Recent Lipid Panel    Component Value Date/Time   CHOL 200 01/15/2019 0309   TRIG 74 01/15/2019 0309   HDL 42 01/15/2019 0309   CHOLHDL 4.8 01/15/2019 0309   VLDL 15 01/15/2019 0309   LDLCALC 143 (H) 01/15/2019 0309    Physical Exam:    VS:  BP 110/80   Pulse 90   Ht 5\' 8"  (1.727 m)   Wt 242 lb 3.2 oz (109.9 kg)   SpO2 99%   BMI 36.83 kg/m     Wt Readings from Last 3 Encounters:  05/04/19 242 lb 3.2 oz (109.9 kg)  03/25/19 240 lb (108.9 kg)  03/02/19 237 lb 6.4 oz (107.7 kg)     GEN:  Well nourished, well developed in no acute distress HEENT: Normal NECK: No JVD; No carotid bruits LYMPHATICS: No lymphadenopathy CARDIAC: RRR, no murmurs, no rubs, no gallops RESPIRATORY:  Clear to auscultation without rales, wheezing or rhonchi  ABDOMEN: Soft, non-tender, non-distended MUSCULOSKELETAL:  No edema; No deformity  SKIN: Warm and dry LOWER EXTREMITIES: no swelling NEUROLOGIC:  Alert and oriented x 3 PSYCHIATRIC:  Normal affect   ASSESSMENT:    1. Nonischemic cardiomyopathy [facility]) cardiac catheterization May 2020 showed normal coronaries, ejection fraction 30%   2. Essential hypertension   3. Chronic combined systolic and  diastolic congestive heart failure, NYHA class 2 (HCC)    PLAN:    In order of problems listed above:  1. Nonischemic cardiomyopathy.  I will check Chem-7 today if Chem-7 is close to previous number from 12 days ago will augment her diuresis.  Hopefully by that time will improve her legs.  In the meantime we will continue with beta-blocker and ACE inhibitor.  I see her back in 2 weeks to see how she does send out when this therapy. 2. Essential hypertension blood pressure  well controlled continue present management. 3. Chronic systolic and diastolic congestive heart failure [place] Heart Association 3.  Plan as outlined above.  The goal is to put her on appropriate medications we do have some difficulty because of her financial situation.  Recently she was switched back to ACE inhibitor.  In the future we need to reconsider ICD    Medication Adjustments/Labs and Tests Ordered: Current medicines are reviewed at length with the patient today.  Concerns regarding medicines are outlined above.  No orders of the defined types were placed in this encounter.  Medication changes: No orders of the defined types were placed in this encounter.   Signed, Georgeanna Leaobert J. Makynlee Kressin, MD, [facility] 05/04/2019 2:50 PM    [facility]

## 2019-05-04 NOTE — Patient Instructions (Signed)
Medication Instructions:  Your physician recommends that you continue on your current medications as directed. Please refer to the Current Medication list given to you today.  If you need a refill on your cardiac medications before your next appointment, please call your pharmacy.   Lab work: Your physician recommends that you have the following labs drawn: BMP and BNP  If you have labs (blood work) drawn today and your tests are completely normal, you will receive your results only by: Marland Kitchen MyChart Message (if you have MyChart) OR . A paper copy in the mail If you have any lab test that is abnormal or we need to change your treatment, we will call you to review the results.  Testing/Procedures: None ordered  Follow-Up: At [facility], you and your health needs are our priority.  As part of our continuing mission to provide you with exceptional heart care, we have created designated Provider Care Teams.  These Care Teams include your primary Cardiologist (physician) and Advanced Practice Providers (APPs -  Physician Assistants and Nurse Practitioners) who all work together to provide you with the care you need, when you need it. You will need a follow up appointment in 2-3weeks. You may see Jenne Campus or another member of our [employer] Provider Team in [place]: Shirlee More, MD . Jyl Heinz, MD

## 2019-05-05 ENCOUNTER — Other Ambulatory Visit: Payer: Self-pay | Admitting: Cardiology

## 2019-05-05 NOTE — Telephone Encounter (Signed)
Patient was seen in office yesterday 

## 2019-05-05 NOTE — Telephone Encounter (Signed)
Rx refill sent to pharmacy. 

## 2019-05-06 LAB — BASIC METABOLIC PANEL
BUN/Creatinine Ratio: 17 (ref 12–28)
BUN: 17 mg/dL (ref 8–27)
CO2: 23 mmol/L (ref 20–29)
Calcium: 9.4 mg/dL (ref 8.7–10.3)
Chloride: 102 mmol/L (ref 96–106)
Creatinine, Ser: 1.02 mg/dL — ABNORMAL HIGH (ref 0.57–1.00)
GFR calc Af Amer: 68 mL/min/{1.73_m2} (ref >59)
GFR calc non Af Amer: 59 mL/min/{1.73_m2} — ABNORMAL LOW (ref >59)
Glucose: 172 mg/dL — ABNORMAL HIGH (ref 65–99)
Potassium: 4.1 mmol/L (ref 3.5–5.2)
Sodium: 142 mmol/L (ref 134–144)

## 2019-05-06 LAB — BRAIN NATRIURETIC PEPTIDE: BNP: 3134.7 pg/mL — ABNORMAL HIGH (ref 0.0–100.0)

## 2019-05-16 ENCOUNTER — Telehealth: Payer: Self-pay | Admitting: Emergency Medicine

## 2019-05-16 DIAGNOSIS — I10 Essential (primary) hypertension: Secondary | ICD-10-CM

## 2019-05-16 MED ORDER — FUROSEMIDE 40 MG PO TABS: 40.0000 mg | ORAL_TABLET | Freq: Two times a day (BID) | ORAL | 1 refills | Status: DC

## 2019-05-16 NOTE — Telephone Encounter (Signed)
Called patient's daughter per dpr informed her of lab results and per Dr. Agustin Cree to increase lasix to 40 mg twice daily. She was also informed to have patient have labs redrawn in 1 week. She will inform patient. No further questions.

## 2019-05-19 ENCOUNTER — Ambulatory Visit: Admit: 2019-05-19 | Payer: BLUE CROSS/BLUE SHIELD | Admitting: Interventional Radiology

## 2019-05-19 ENCOUNTER — Inpatient Hospital Stay (HOSPITAL_COMMUNITY): Payer: BLUE CROSS/BLUE SHIELD

## 2019-05-19 ENCOUNTER — Emergency Department (HOSPITAL_COMMUNITY): Payer: BLUE CROSS/BLUE SHIELD

## 2019-05-19 ENCOUNTER — Inpatient Hospital Stay (HOSPITAL_COMMUNITY)
Admission: EM | Admit: 2019-05-19 | Discharge: 2019-05-30 | DRG: 023 | Disposition: A | Discharge status: Rehabilitation Facility | Payer: BLUE CROSS/BLUE SHIELD | Source: Other Acute Inpatient Hospital | Attending: Neurology | Admitting: Neurology

## 2019-05-19 ENCOUNTER — Encounter (HOSPITAL_COMMUNITY)
Admission: EM | Disposition: A | Discharge status: Rehabilitation Facility | Payer: Self-pay | Source: Other Acute Inpatient Hospital | Attending: Neurology

## 2019-05-19 ENCOUNTER — Emergency Department (HOSPITAL_COMMUNITY): Payer: BLUE CROSS/BLUE SHIELD | Admitting: Certified Registered"

## 2019-05-19 ENCOUNTER — Telehealth (HOSPITAL_COMMUNITY): Payer: Self-pay

## 2019-05-19 DIAGNOSIS — G8194 Hemiplegia, unspecified affecting left nondominant side: Secondary | ICD-10-CM | POA: Diagnosis present

## 2019-05-19 DIAGNOSIS — J9690 Respiratory failure, unspecified, unspecified whether with hypoxia or hypercapnia: Secondary | ICD-10-CM

## 2019-05-19 DIAGNOSIS — I088 Other rheumatic multiple valve diseases: Secondary | ICD-10-CM | POA: Diagnosis present

## 2019-05-19 DIAGNOSIS — I110 Hypertensive heart disease with heart failure: Secondary | ICD-10-CM | POA: Diagnosis present

## 2019-05-19 DIAGNOSIS — I639 Cerebral infarction, unspecified: Secondary | ICD-10-CM

## 2019-05-19 DIAGNOSIS — R52 Pain, unspecified: Secondary | ICD-10-CM

## 2019-05-19 DIAGNOSIS — I447 Left bundle-branch block, unspecified: Secondary | ICD-10-CM | POA: Diagnosis present

## 2019-05-19 DIAGNOSIS — Z79899 Other long term (current) drug therapy: Secondary | ICD-10-CM

## 2019-05-19 DIAGNOSIS — W19XXXA Unspecified fall, initial encounter: Secondary | ICD-10-CM | POA: Diagnosis present

## 2019-05-19 DIAGNOSIS — I509 Heart failure, unspecified: Secondary | ICD-10-CM

## 2019-05-19 DIAGNOSIS — I952 Hypotension due to drugs: Secondary | ICD-10-CM | POA: Diagnosis not present

## 2019-05-19 DIAGNOSIS — Y92239 Unspecified place in hospital as the place of occurrence of the external cause: Secondary | ICD-10-CM | POA: Diagnosis not present

## 2019-05-19 DIAGNOSIS — Z6838 Body mass index (BMI) 38.0-38.9, adult: Secondary | ICD-10-CM

## 2019-05-19 DIAGNOSIS — Z800 Family history of malignant neoplasm of digestive organs: Secondary | ICD-10-CM

## 2019-05-19 DIAGNOSIS — Z8249 Family history of ischemic heart disease and other diseases of the circulatory system: Secondary | ICD-10-CM

## 2019-05-19 DIAGNOSIS — D696 Thrombocytopenia, unspecified: Secondary | ICD-10-CM | POA: Diagnosis present

## 2019-05-19 DIAGNOSIS — I63511 Cerebral infarction due to unspecified occlusion or stenosis of right middle cerebral artery: Secondary | ICD-10-CM

## 2019-05-19 DIAGNOSIS — K219 Gastro-esophageal reflux disease without esophagitis: Secondary | ICD-10-CM | POA: Diagnosis present

## 2019-05-19 DIAGNOSIS — I361 Nonrheumatic tricuspid (valve) insufficiency: Secondary | ICD-10-CM | POA: Diagnosis not present

## 2019-05-19 DIAGNOSIS — R1310 Dysphagia, unspecified: Secondary | ICD-10-CM | POA: Diagnosis present

## 2019-05-19 DIAGNOSIS — J9601 Acute respiratory failure with hypoxia: Secondary | ICD-10-CM | POA: Diagnosis not present

## 2019-05-19 DIAGNOSIS — I420 Dilated cardiomyopathy: Secondary | ICD-10-CM | POA: Diagnosis present

## 2019-05-19 DIAGNOSIS — Z0189 Encounter for other specified special examinations: Secondary | ICD-10-CM

## 2019-05-19 DIAGNOSIS — T41295A Adverse effect of other general anesthetics, initial encounter: Secondary | ICD-10-CM | POA: Diagnosis not present

## 2019-05-19 DIAGNOSIS — Z888 Allergy status to other drugs, medicaments and biological substances status: Secondary | ICD-10-CM

## 2019-05-19 DIAGNOSIS — I6523 Occlusion and stenosis of bilateral carotid arteries: Secondary | ICD-10-CM | POA: Diagnosis present

## 2019-05-19 DIAGNOSIS — R609 Edema, unspecified: Secondary | ICD-10-CM | POA: Diagnosis not present

## 2019-05-19 DIAGNOSIS — Z4659 Encounter for fitting and adjustment of other gastrointestinal appliance and device: Secondary | ICD-10-CM

## 2019-05-19 DIAGNOSIS — R29810 Facial weakness: Secondary | ICD-10-CM | POA: Diagnosis present

## 2019-05-19 DIAGNOSIS — I6601 Occlusion and stenosis of right middle cerebral artery: Secondary | ICD-10-CM | POA: Diagnosis present

## 2019-05-19 DIAGNOSIS — T7840XD Allergy, unspecified, subsequent encounter: Secondary | ICD-10-CM

## 2019-05-19 DIAGNOSIS — R791 Abnormal coagulation profile: Secondary | ICD-10-CM | POA: Diagnosis not present

## 2019-05-19 DIAGNOSIS — I10 Essential (primary) hypertension: Secondary | ICD-10-CM | POA: Diagnosis present

## 2019-05-19 DIAGNOSIS — J9600 Acute respiratory failure, unspecified whether with hypoxia or hypercapnia: Secondary | ICD-10-CM | POA: Diagnosis present

## 2019-05-19 DIAGNOSIS — Z20828 Contact with and (suspected) exposure to other viral communicable diseases: Secondary | ICD-10-CM | POA: Diagnosis present

## 2019-05-19 DIAGNOSIS — Z7982 Long term (current) use of aspirin: Secondary | ICD-10-CM

## 2019-05-19 DIAGNOSIS — H0520 Unspecified exophthalmos: Secondary | ICD-10-CM | POA: Diagnosis present

## 2019-05-19 DIAGNOSIS — R29714 NIHSS score 14: Secondary | ICD-10-CM | POA: Diagnosis present

## 2019-05-19 DIAGNOSIS — E785 Hyperlipidemia, unspecified: Secondary | ICD-10-CM | POA: Diagnosis present

## 2019-05-19 DIAGNOSIS — I63411 Cerebral infarction due to embolism of right middle cerebral artery: Secondary | ICD-10-CM | POA: Diagnosis present

## 2019-05-19 DIAGNOSIS — M25551 Pain in right hip: Secondary | ICD-10-CM | POA: Diagnosis present

## 2019-05-19 DIAGNOSIS — I5023 Acute on chronic systolic (congestive) heart failure: Secondary | ICD-10-CM | POA: Diagnosis present

## 2019-05-19 DIAGNOSIS — H53462 Homonymous bilateral field defects, left side: Secondary | ICD-10-CM | POA: Diagnosis present

## 2019-05-19 DIAGNOSIS — M8580 Other specified disorders of bone density and structure, unspecified site: Secondary | ICD-10-CM | POA: Diagnosis present

## 2019-05-19 DIAGNOSIS — I440 Atrioventricular block, first degree: Secondary | ICD-10-CM | POA: Diagnosis present

## 2019-05-19 DIAGNOSIS — E1165 Type 2 diabetes mellitus with hyperglycemia: Secondary | ICD-10-CM | POA: Diagnosis present

## 2019-05-19 DIAGNOSIS — IMO0002 Reserved for concepts with insufficient information to code with codable children: Secondary | ICD-10-CM | POA: Diagnosis present

## 2019-05-19 DIAGNOSIS — I5021 Acute systolic (congestive) heart failure: Secondary | ICD-10-CM | POA: Diagnosis present

## 2019-05-19 HISTORY — PX: RADIOLOGY WITH ANESTHESIA: SHX6223

## 2019-05-19 HISTORY — DX: Cerebral infarction, unspecified: I63.9

## 2019-05-19 HISTORY — PX: IR ANGIO VERTEBRAL SEL SUBCLAVIAN INNOMINATE UNI R MOD SED: IMG5365

## 2019-05-19 HISTORY — DX: Occlusion and stenosis of right middle cerebral artery: I66.01

## 2019-05-19 HISTORY — PX: IR CT HEAD LTD: IMG2386

## 2019-05-19 HISTORY — PX: IR PERCUTANEOUS ART THROMBECTOMY/INFUSION INTRACRANIAL INC DIAG ANGIO: IMG6087

## 2019-05-19 LAB — POCT I-STAT 7, (LYTES, BLD GAS, ICA,H+H)
Acid-base deficit: 1 mmol/L (ref 0.0–2.0)
Bicarbonate: 21.7 mmol/L (ref 20.0–28.0)
Calcium, Ion: 1.14 mmol/L — ABNORMAL LOW (ref 1.15–1.40)
HCT: 45 % (ref 36.0–46.0)
Hemoglobin: 15.3 g/dL — ABNORMAL HIGH (ref 12.0–15.0)
O2 Saturation: 100 %
Potassium: 3.5 mmol/L (ref 3.5–5.1)
Sodium: 141 mmol/L (ref 135–145)
TCO2: 23 mmol/L (ref 22–32)
pCO2 arterial: 29.8 mmHg — ABNORMAL LOW (ref 32.0–48.0)
pH, Arterial: 7.47 — ABNORMAL HIGH (ref 7.350–7.450)
pO2, Arterial: 342 mmHg — ABNORMAL HIGH (ref 83.0–108.0)

## 2019-05-19 LAB — I-STAT CHEM 8, ED
BUN: 17 mg/dL (ref 8–23)
Calcium, Ion: 1.1 mmol/L — ABNORMAL LOW (ref 1.15–1.40)
Chloride: 104 mmol/L (ref 98–111)
Creatinine, Ser: 0.9 mg/dL (ref 0.44–1.00)
Glucose, Bld: 146 mg/dL — ABNORMAL HIGH (ref 70–99)
HCT: 50 % — ABNORMAL HIGH (ref 36.0–46.0)
Hemoglobin: 17 g/dL — ABNORMAL HIGH (ref 12.0–15.0)
Potassium: 4.1 mmol/L (ref 3.5–5.1)
Sodium: 141 mmol/L (ref 135–145)
TCO2: 24 mmol/L (ref 22–32)

## 2019-05-19 LAB — COMPREHENSIVE METABOLIC PANEL
ALT: 21 U/L (ref 0–44)
AST: 27 U/L (ref 15–41)
Albumin: 3.1 g/dL — ABNORMAL LOW (ref 3.5–5.0)
Alkaline Phosphatase: 119 U/L (ref 38–126)
Anion gap: 13 (ref 5–15)
BUN: 16 mg/dL (ref 8–23)
CO2: 23 mmol/L (ref 22–32)
Calcium: 9 mg/dL (ref 8.9–10.3)
Chloride: 105 mmol/L (ref 98–111)
Creatinine, Ser: 1.09 mg/dL — ABNORMAL HIGH (ref 0.44–1.00)
GFR calc Af Amer: >60 mL/min (ref >60)
GFR calc non Af Amer: 54 mL/min — ABNORMAL LOW (ref >60)
Glucose, Bld: 153 mg/dL — ABNORMAL HIGH (ref 70–99)
Potassium: 4.3 mmol/L (ref 3.5–5.1)
Sodium: 141 mmol/L (ref 135–145)
Total Bilirubin: 1.8 mg/dL — ABNORMAL HIGH (ref 0.3–1.2)
Total Protein: 6.1 g/dL — ABNORMAL LOW (ref 6.5–8.1)

## 2019-05-19 LAB — DIFFERENTIAL
Abs Immature Granulocytes: 0.02 10*3/uL (ref 0.00–0.07)
Basophils Absolute: 0.1 10*3/uL (ref 0.0–0.1)
Basophils Relative: 1 %
Eosinophils Absolute: 0 10*3/uL (ref 0.0–0.5)
Eosinophils Relative: 1 %
Immature Granulocytes: 0 %
Lymphocytes Relative: 24 %
Lymphs Abs: 1.4 10*3/uL (ref 0.7–4.0)
Monocytes Absolute: 0.6 10*3/uL (ref 0.1–1.0)
Monocytes Relative: 11 %
Neutro Abs: 3.5 10*3/uL (ref 1.7–7.7)
Neutrophils Relative %: 63 %

## 2019-05-19 LAB — PROTIME-INR
INR: 1.4 — ABNORMAL HIGH (ref 0.8–1.2)
Prothrombin Time: 16.6 seconds — ABNORMAL HIGH (ref 11.4–15.2)

## 2019-05-19 LAB — CBC
HCT: 48.1 % — ABNORMAL HIGH (ref 36.0–46.0)
Hemoglobin: 16.1 g/dL — ABNORMAL HIGH (ref 12.0–15.0)
MCH: 28.7 pg (ref 26.0–34.0)
MCHC: 33.5 g/dL (ref 30.0–36.0)
MCV: 85.7 fL (ref 80.0–100.0)
Platelets: 208 10*3/uL (ref 150–400)
RBC: 5.61 MIL/uL — ABNORMAL HIGH (ref 3.87–5.11)
RDW: 16.7 % — ABNORMAL HIGH (ref 11.5–15.5)
WBC: 5.6 10*3/uL (ref 4.0–10.5)
nRBC: 0 % (ref 0.0–0.2)

## 2019-05-19 LAB — URINALYSIS, ROUTINE W REFLEX MICROSCOPIC
Bilirubin Urine: NEGATIVE
Glucose, UA: NEGATIVE mg/dL
Hgb urine dipstick: NEGATIVE
Ketones, ur: NEGATIVE mg/dL
Leukocytes,Ua: NEGATIVE
Nitrite: POSITIVE — AB
Protein, ur: NEGATIVE mg/dL
Specific Gravity, Urine: >1.046 — ABNORMAL HIGH (ref 1.005–1.030)
pH: 5 (ref 5.0–8.0)

## 2019-05-19 LAB — RAPID URINE DRUG SCREEN, HOSP PERFORMED
Amphetamines: NOT DETECTED
Barbiturates: NOT DETECTED
Benzodiazepines: NOT DETECTED
Cocaine: NOT DETECTED
Opiates: NOT DETECTED
Tetrahydrocannabinol: NOT DETECTED

## 2019-05-19 LAB — ETHANOL: Alcohol, Ethyl (B): <10 mg/dL (ref <10)

## 2019-05-19 LAB — MRSA PCR SCREENING: MRSA by PCR: NEGATIVE

## 2019-05-19 LAB — TROPONIN I (HIGH SENSITIVITY)
Troponin I (High Sensitivity): 14 ng/L (ref <18)
Troponin I (High Sensitivity): 12 ng/L (ref <18)

## 2019-05-19 LAB — GLUCOSE, CAPILLARY
Glucose-Capillary: 119 mg/dL — ABNORMAL HIGH (ref 70–99)
Glucose-Capillary: 122 mg/dL — ABNORMAL HIGH (ref 70–99)
Glucose-Capillary: 93 mg/dL (ref 70–99)

## 2019-05-19 LAB — APTT: aPTT: 28 seconds (ref 24–36)

## 2019-05-19 LAB — TSH: TSH: 0.844 u[IU]/mL (ref 0.350–4.500)

## 2019-05-19 LAB — BRAIN NATRIURETIC PEPTIDE: B Natriuretic Peptide: 3910.8 pg/mL — ABNORMAL HIGH (ref 0.0–100.0)

## 2019-05-19 LAB — MAGNESIUM: Magnesium: 1.7 mg/dL (ref 1.7–2.4)

## 2019-05-19 LAB — CBG MONITORING, ED: Glucose-Capillary: 141 mg/dL — ABNORMAL HIGH (ref 70–99)

## 2019-05-19 LAB — TRIGLYCERIDES: Triglycerides: 90 mg/dL (ref <150)

## 2019-05-19 IMAGING — DX DG ABD PORTABLE 1V
1 series · 1 of 1 positions shown · non-contrast
Comparison: [DATE]

CLINICAL DATA: OG tube placement.

EXAM:
PORTABLE ABDOMEN - 1 VIEW

[abdomen]
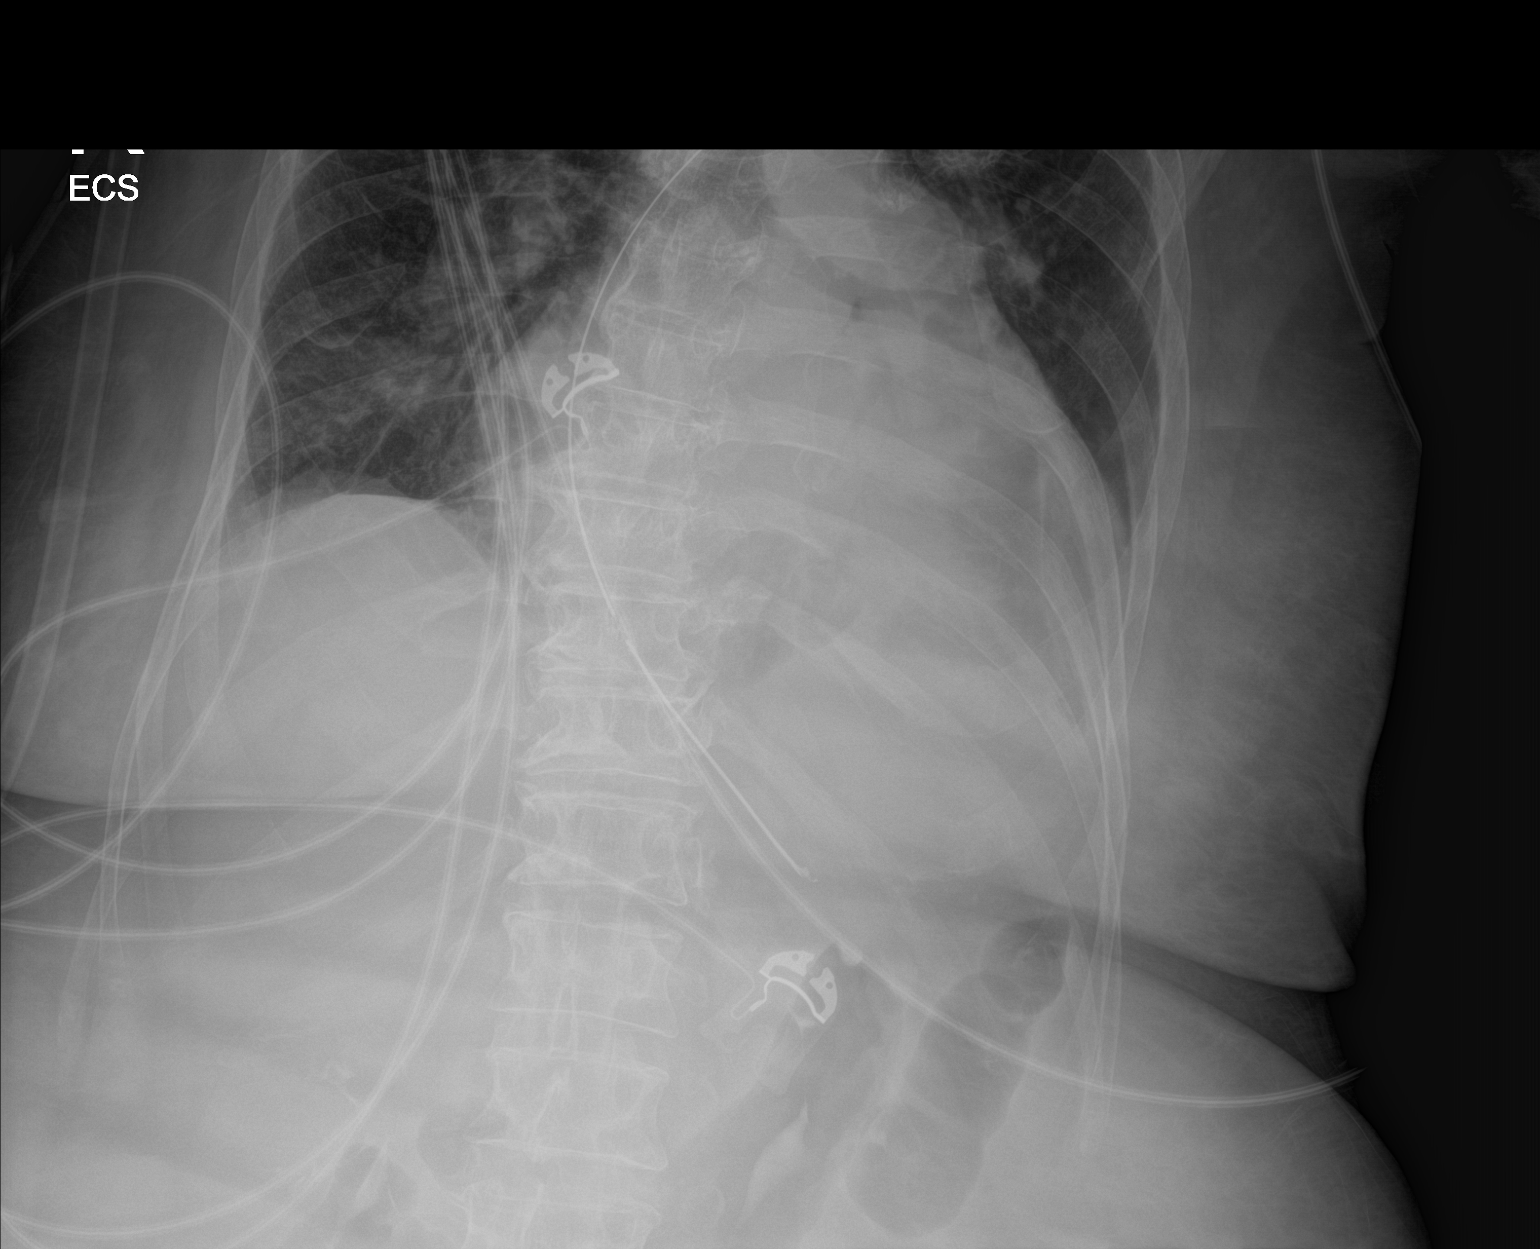

[1 of 1 positions shown; findings below may reference images not displayed]

FINDINGS: Dense retrocardiac opacification. Cardiomegaly similar to prior
study.

Orogastric tube passes into the proximal stomach, side port at the
GE junction.

Bowel gas pattern unremarkable though incompletely imaged.
IMPRESSION: Orogastric tube in proximal stomach, side-port EG junction.

Consolidation at the left lung base may represent developing
pneumonitis.

## 2019-05-19 IMAGING — DX DG HIP (WITH OR WITHOUT PELVIS) 1V PORT*R*
2 series · 2 of 2 positions shown · non-contrast
Comparison: None.

CLINICAL DATA: Status post fall, pain

EXAM:
DG HIP (WITH OR WITHOUT PELVIS) 1V PORT RIGHT

[pelvis ap]
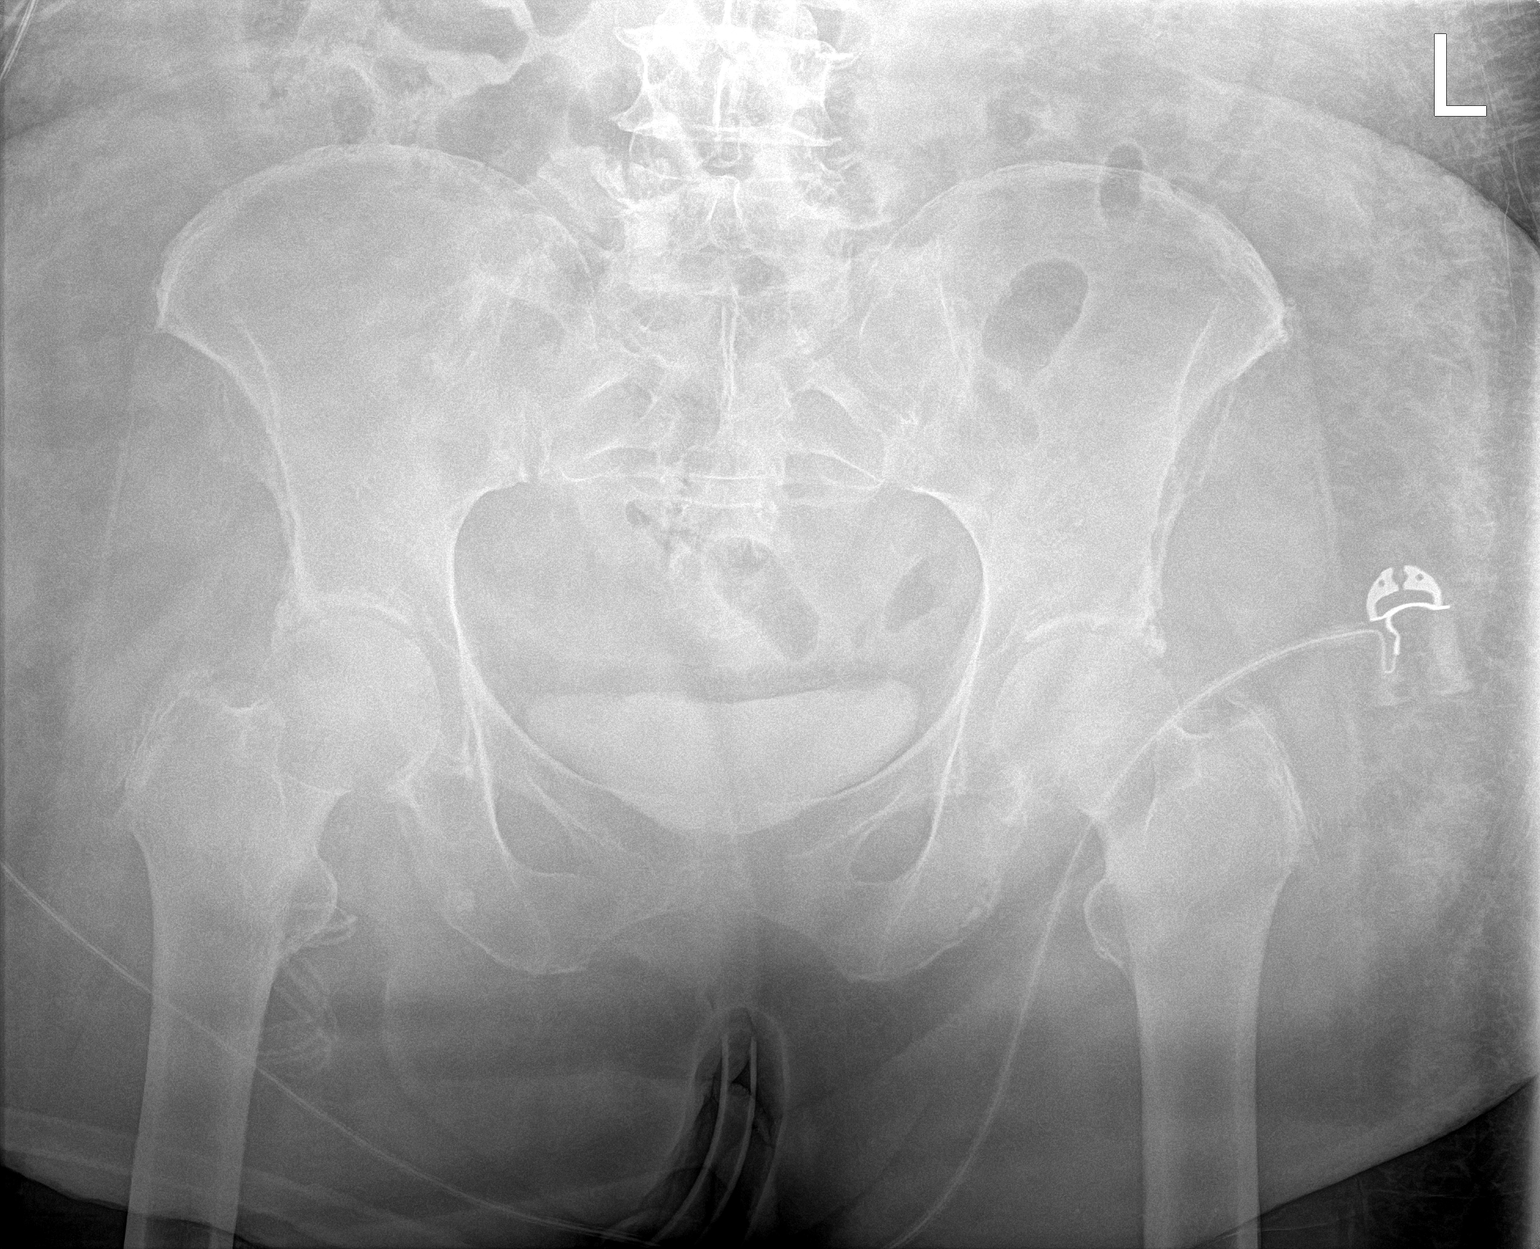

[hip ap]
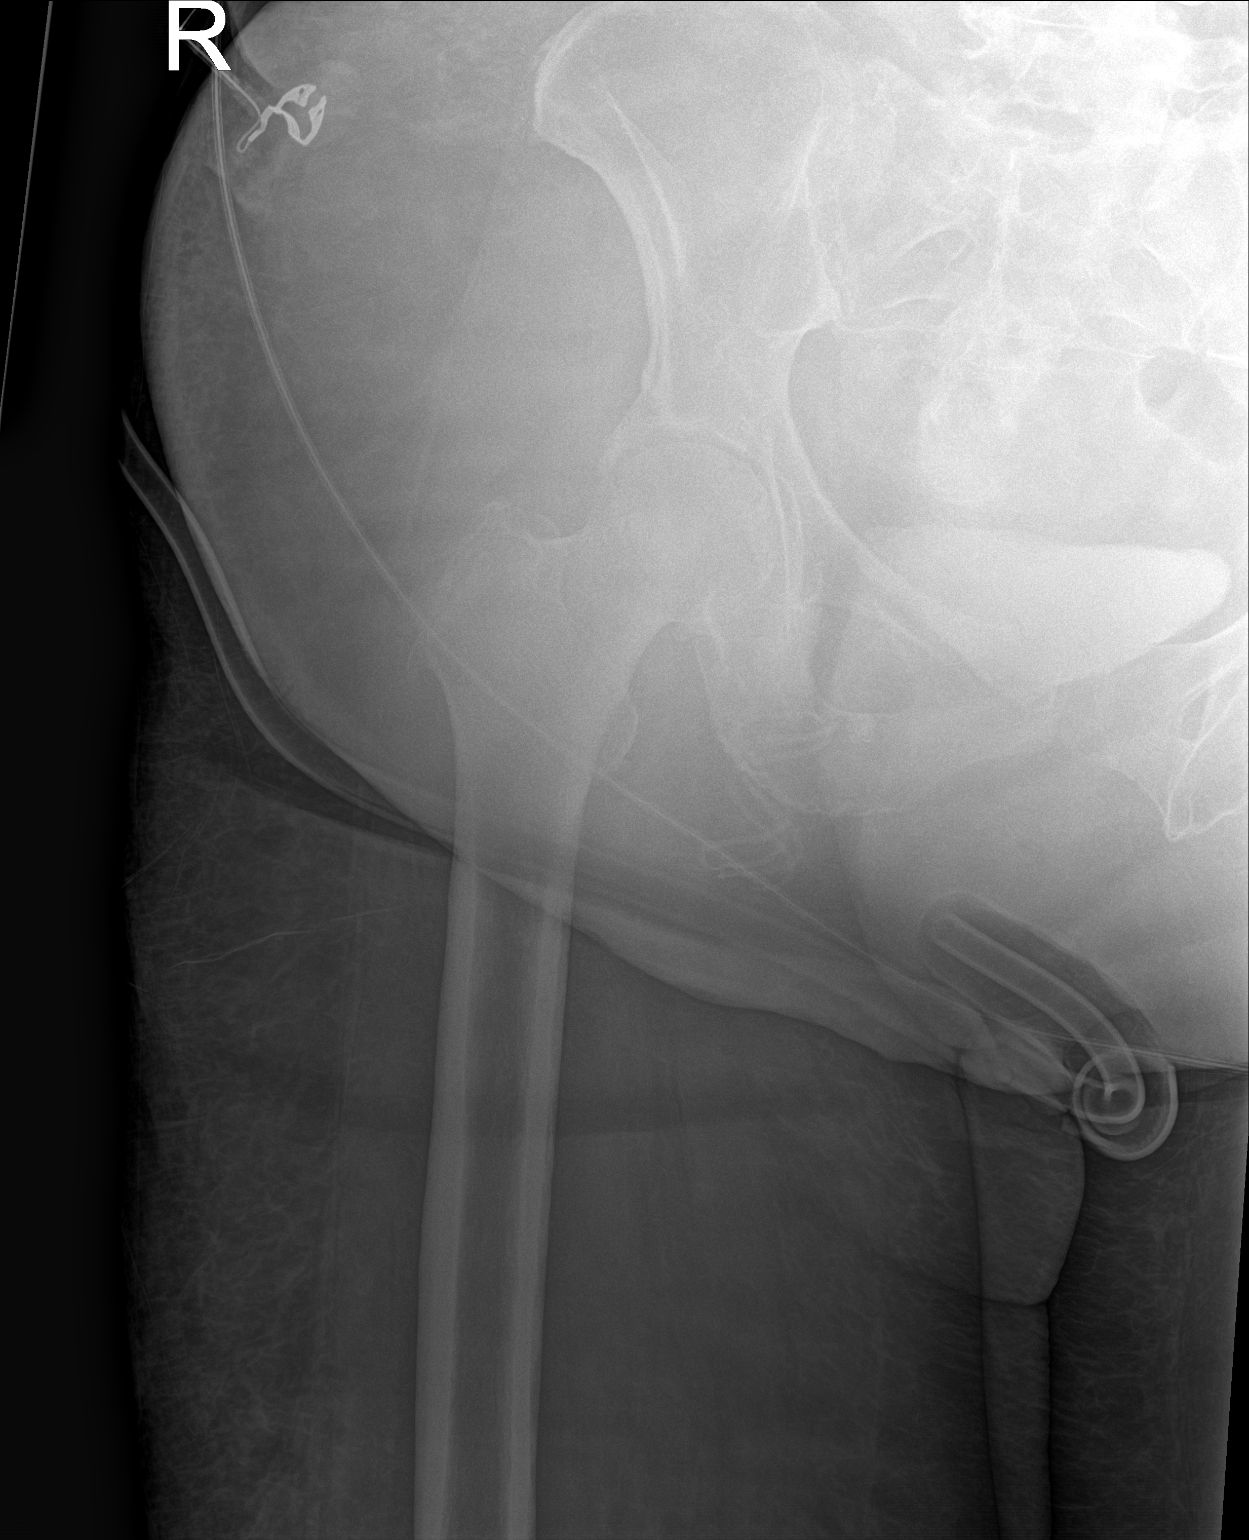

[2 of 2 positions shown; findings below may reference images not displayed]

FINDINGS: Limited examination secondary to technique and osteopenia. No acute
fracture or dislocation. No aggressive osseous lesion.
IMPRESSION: No acute osseous injury of the right hip. Given the patient's age
and osteopenia, if there is persistent clinical concern for an
occult hip fracture, a CT or MRI of the hip is recommended for
increased sensitivity.

## 2019-05-19 IMAGING — XA IR PERCUTANEOUS ART THORMBECTOMY/INFUSION INTRACRANIAL INCLUDE D
2 series · 12 of 24 positions shown · non-contrast
Comparison: CT angiogram the head and neck [DATE].

INDICATION: New onset of left-sided weakness with dysarthria and right gaze
deviation. Occluded right middle cerebral artery M1 segment on CT
angiogram of the head and neck.

EXAM:
1. EMERGENT LARGE VESSEL OCCLUSION THROMBOLYSIS (anterior
CIRCULATION)
TECHNIQUE: Following a full explanation of the procedure along with the
potential associated complications, an informed witnessed consent
was obtained patient's family. The risks of intracranial hemorrhage
of 10%, worsening neurological deficit, ventilator dependency, death
and inability to revascularize were all reviewed in detail with the
patient's family.

[Series 13: ax · axial · 3.0mm · 0.35mm/px · z∈[-33,+61]mm · 2 of 31 slices shown]
[im 11/31]
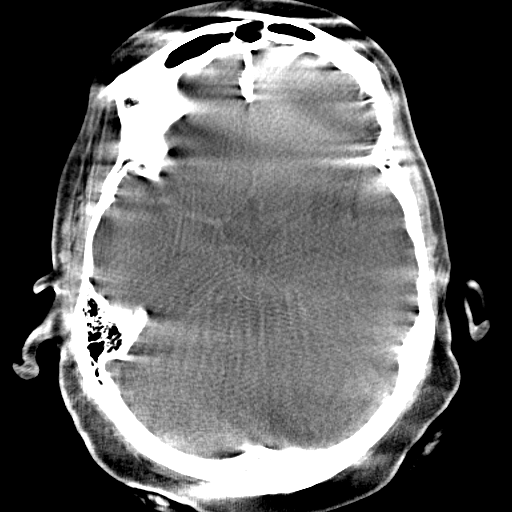
[im 31/31]
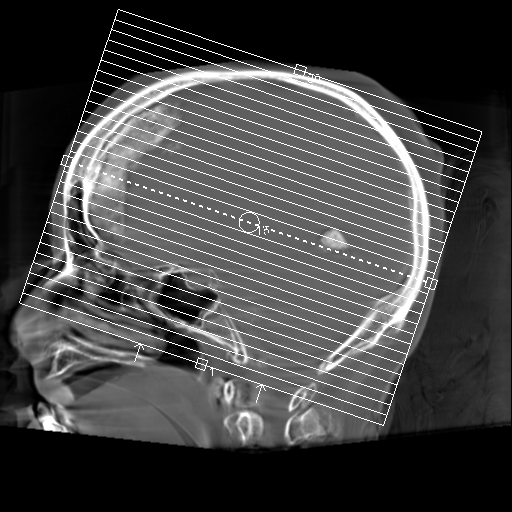

[Series 300: dr. (person_name) · 10 of 149 slices shown]
[im 8/149]
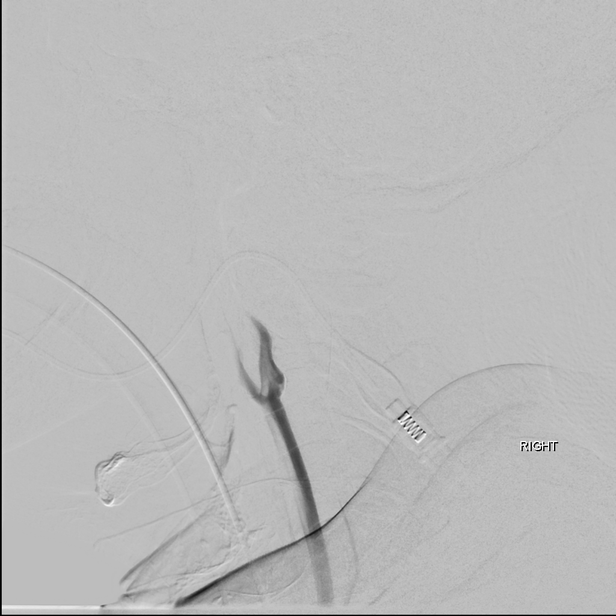
[im 24/149]
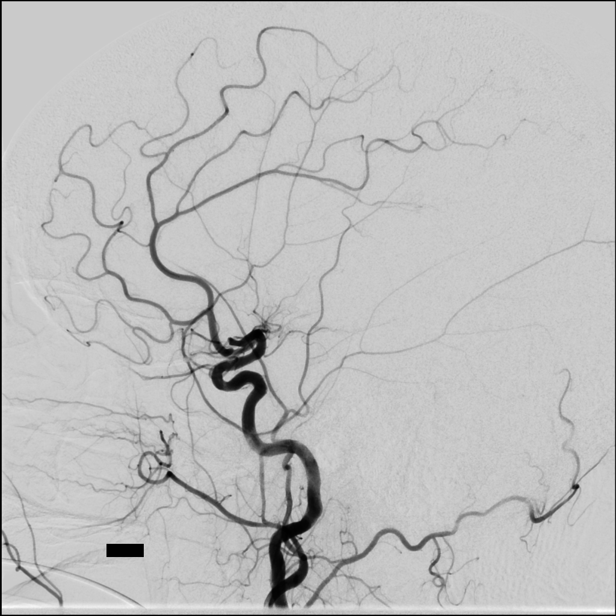
[im 39/149]
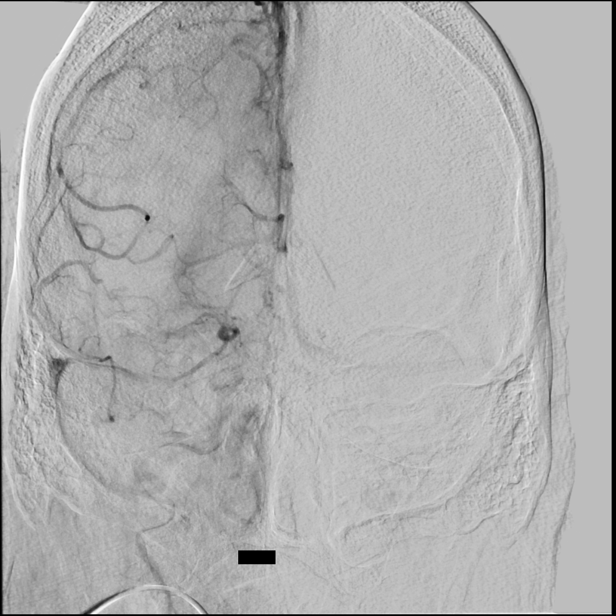
[im 55/149]
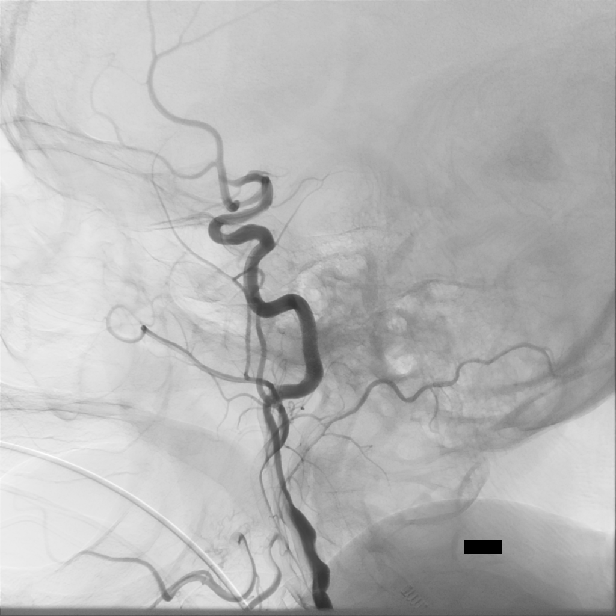
[im 71/149]
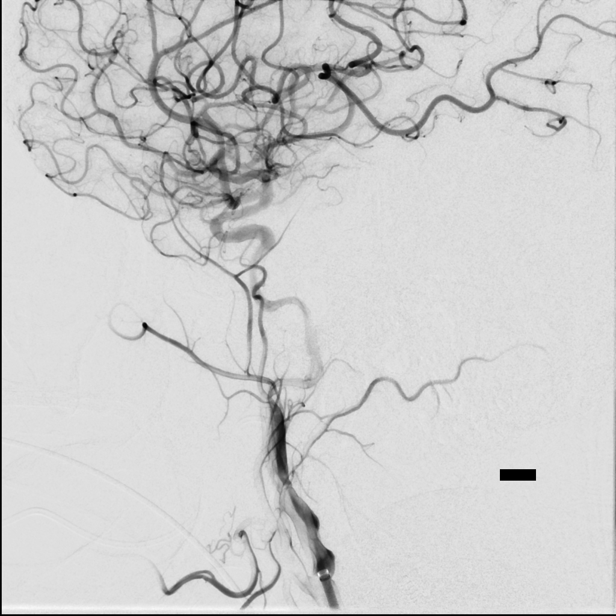
[im 86/149]
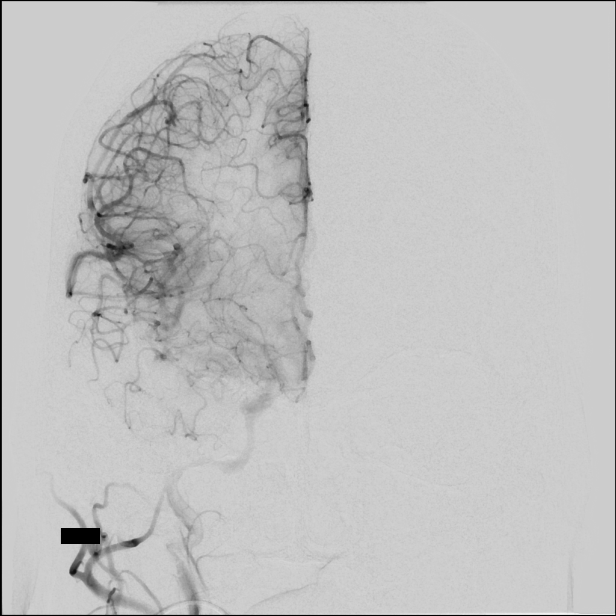
[im 102/149]
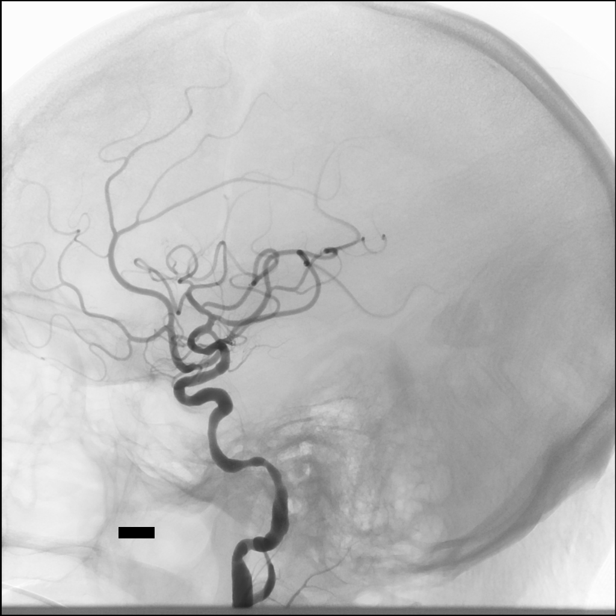
[im 117/149]
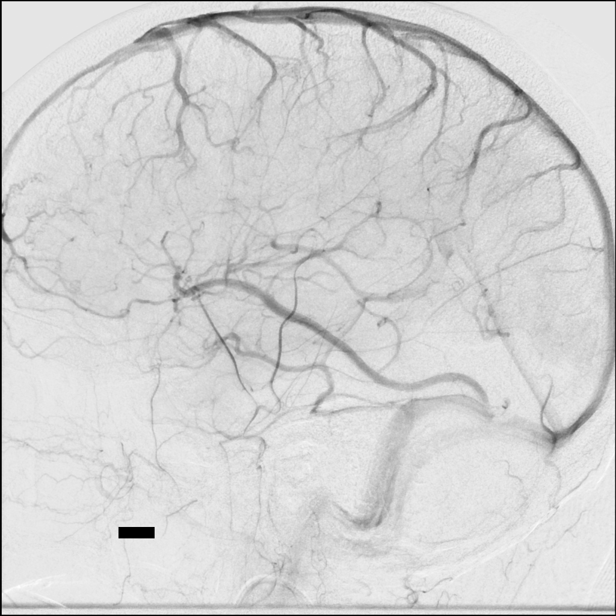
[im 133/149]
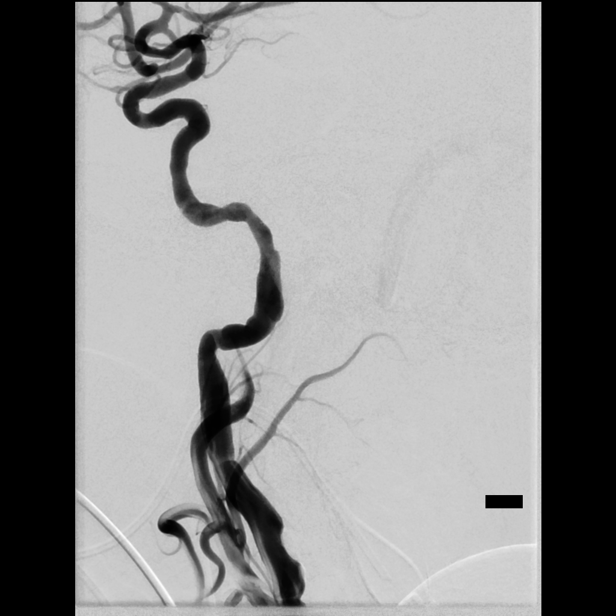
[im 149/149]
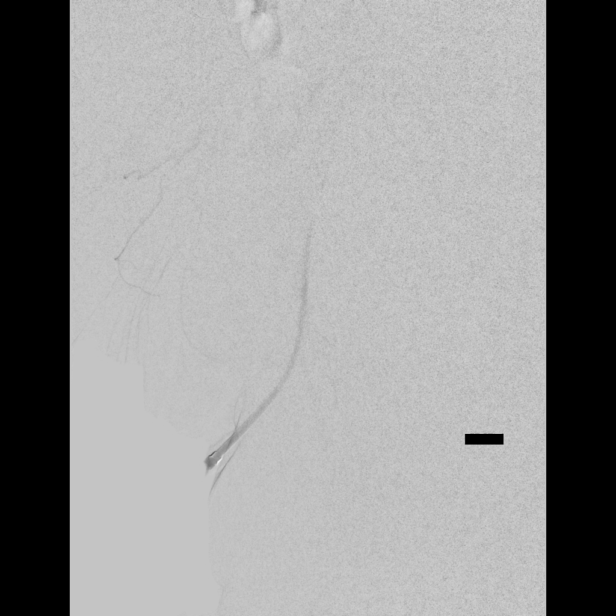

[12 of 24 positions shown; findings below may reference images not displayed]

MEDICATIONS:
Ancef 2 g IV antibiotic was administered within 1 hour of the
procedure.

ANESTHESIA/SEDATION:
General anesthesia.

CONTRAST:  Isovue 300 approximately 65 mL.

FLUOROSCOPY TIME:  Fluoroscopy Time: 21 minutes 12 seconds ([N3]
mGy).

COMPLICATIONS:
None immediate.
The patient was then put under general anesthesia by the [REDACTED] at [HOSPITAL].

The right groin was prepped and draped in the usual sterile fashion.
Thereafter using modified Seldinger technique, transfemoral access
into the right common femoral artery was obtained without
difficulty. Over a 0.035 inch guidewire a 5 French Pinnacle sheath
was inserted. Through this, and also over a 0.035 inch guidewire a 5
French JB 1 catheter was advanced to the aortic arch region and
selectively positioned in the innominate artery and the right common
carotid artery.
FINDINGS: The innominate artery injection demonstrates mild stenosis of the
proximal right subclavian artery.

A hypoplastic right vertebral artery is seen to ascend to the
cranial skull base to the level of the occipital bone.

The right common carotid arteriogram demonstrates the right external
carotid artery and its major branches to be widely patent.

The right internal carotid artery at the bulb has a smooth shallow
plaque along the posterior wall without evidence of ulcerations.

No intraluminal filling defects are seen.

The vessel is seen to opacify to the cranial skull base. Petrous,
cavernous and the supraclinoid segments are widely patent.

The right anterior cerebral artery opacifies into the capillary and
venous phases.

The right middle cerebral artery demonstrates complete occlusion in
the mid right M1 segment.

PROCEDURE:
The diagnostic JB 1 catheter in the right common carotid artery was
exchanged over a 0.035 inch 300 cm Rosen exchange guidewire for an 8
French Pinnacle sheath in the right groin, which was connected to
continuous heparinized saline infusion. Over the Rosen exchange
guidewire, a 95 cm 087 Walrus balloon guide catheter which had been
prepped with 50% contrast and 50% heparinized saline infusion was
then advanced to the junction of the middle and proximal [DATE] of the
right internal carotid artery. The guidewire was removed. Good
aspiration was obtained from the hub of the 087 balloon guide
catheter. Gentle contrast injection demonstrated no evidence of
spasms, dissections or of intraluminal filling defects.

The combination of an 021 Trevo ProVue microcatheter inside of a 6
French 132 cm Catalyst guide catheter was then advanced over a
inch standard Synchro micro guidewire to the distal end of the
balloon guide catheter.

With the micro guidewire leading with a J-tip configuration to avoid
dissection or spasm, the combination was navigated without
difficulty to the supraclinoid right ICA. The micro guidewire was
then gently manipulated with a torque device and advanced through
the occluded right middle cerebral artery into inferior division M2
M3 region followed by the microcatheter. The guidewire was removed.
Good aspiration obtained from the hub of the microcatheter. A gentle
contrast injection demonstrated safe position of the tip of the
microcatheter. This was then connected to continuous heparinized
saline infusion.

A 4 mm x 40 mm Solitaire X retrieval device was then advanced to the
distal end of the microcatheter. The O ring on the delivery
microcatheter was then loosened. With slight forward gentle traction
with the right hand on the delivery micro guidewire, with the left
hand the delivery microcatheter was retrieved unsheathing the
retrieval device.

The 6 French Catalyst guide catheter was then advanced to the mid
right M1 segment.

With proximal flow arrest initiated by inflating the balloon of the
Walrus guide catheter in the right internal carotid artery, and
constant aspiration being applied at the hub of the 6 French
Catalyst guide catheter over 2 minutes engaged the proximal portion
of the clot, and with a 60 mL syringe aspiration being applied at
the hub of the Walrus balloon guide catheter, the combination of the
retrieval device, the microcatheter and the Catalyst guide catheter
was then retrieved and removed. Aspiration was continued at the hub
of the Walrus balloon guide catheter as flow arrest was then
reversed.

A control arteriogram performed through the Catalyst guide catheter
in the right internal carotid artery demonstrated complete
revascularization of the occluded right middle cerebral artery with
wide patency of the right anterior cerebral artery.

No angiographic evidence of intraluminal filling defects or of
dissections or of occlusion was seen.

Approximately 125 mcg of intra-arterial nitroglycerin was then given
in divided doses through the Walrus guide catheter to alleviate the
vasospasm noted in the right internal carotid artery distal cervical
and petrous segments.

The Walrus guide catheter was retrieved and removed.

The 8 French Pinnacle sheath was then removed, and manual
compression was held in the right groin for hemostasis.

The distal pulses remained Dopplerable in the dorsalis pedis and the
posterior tibial arteries on the right. On the left, the dorsalis
pedis, and the posterior tibial arteries remained palpable
unchanged.

Throughout the procedure, the patient's blood pressure and
neurological status remained stable.

A flat panel CT of the brain demonstrated no evidence of mass
effect, midline shift or of intracranial hemorrhage. Patient was
left intubated on account of her habitus, and congestive cardiac
failure,

She was then transferred to the neuro ICU for post thrombectomy
management.
IMPRESSION: Status post endovascular complete revascularization of occluded
right middle cerebral artery M1 segment with 1 pass with the 4 mm x
40 mm Solitaire X retrieval device, and Penumbra aspiration as
described achieving a TICI 3 revascularization.

PLAN:
Follow-up 4 weeks post discharge.

## 2019-05-19 IMAGING — DX DG CHEST 1V PORT
1 series · 1 of 1 positions shown · non-contrast
Comparison: [DATE]

CLINICAL DATA: 63-year-old female with a history of stroke

EXAM:
PORTABLE CHEST 1 VIEW

[chest ap]
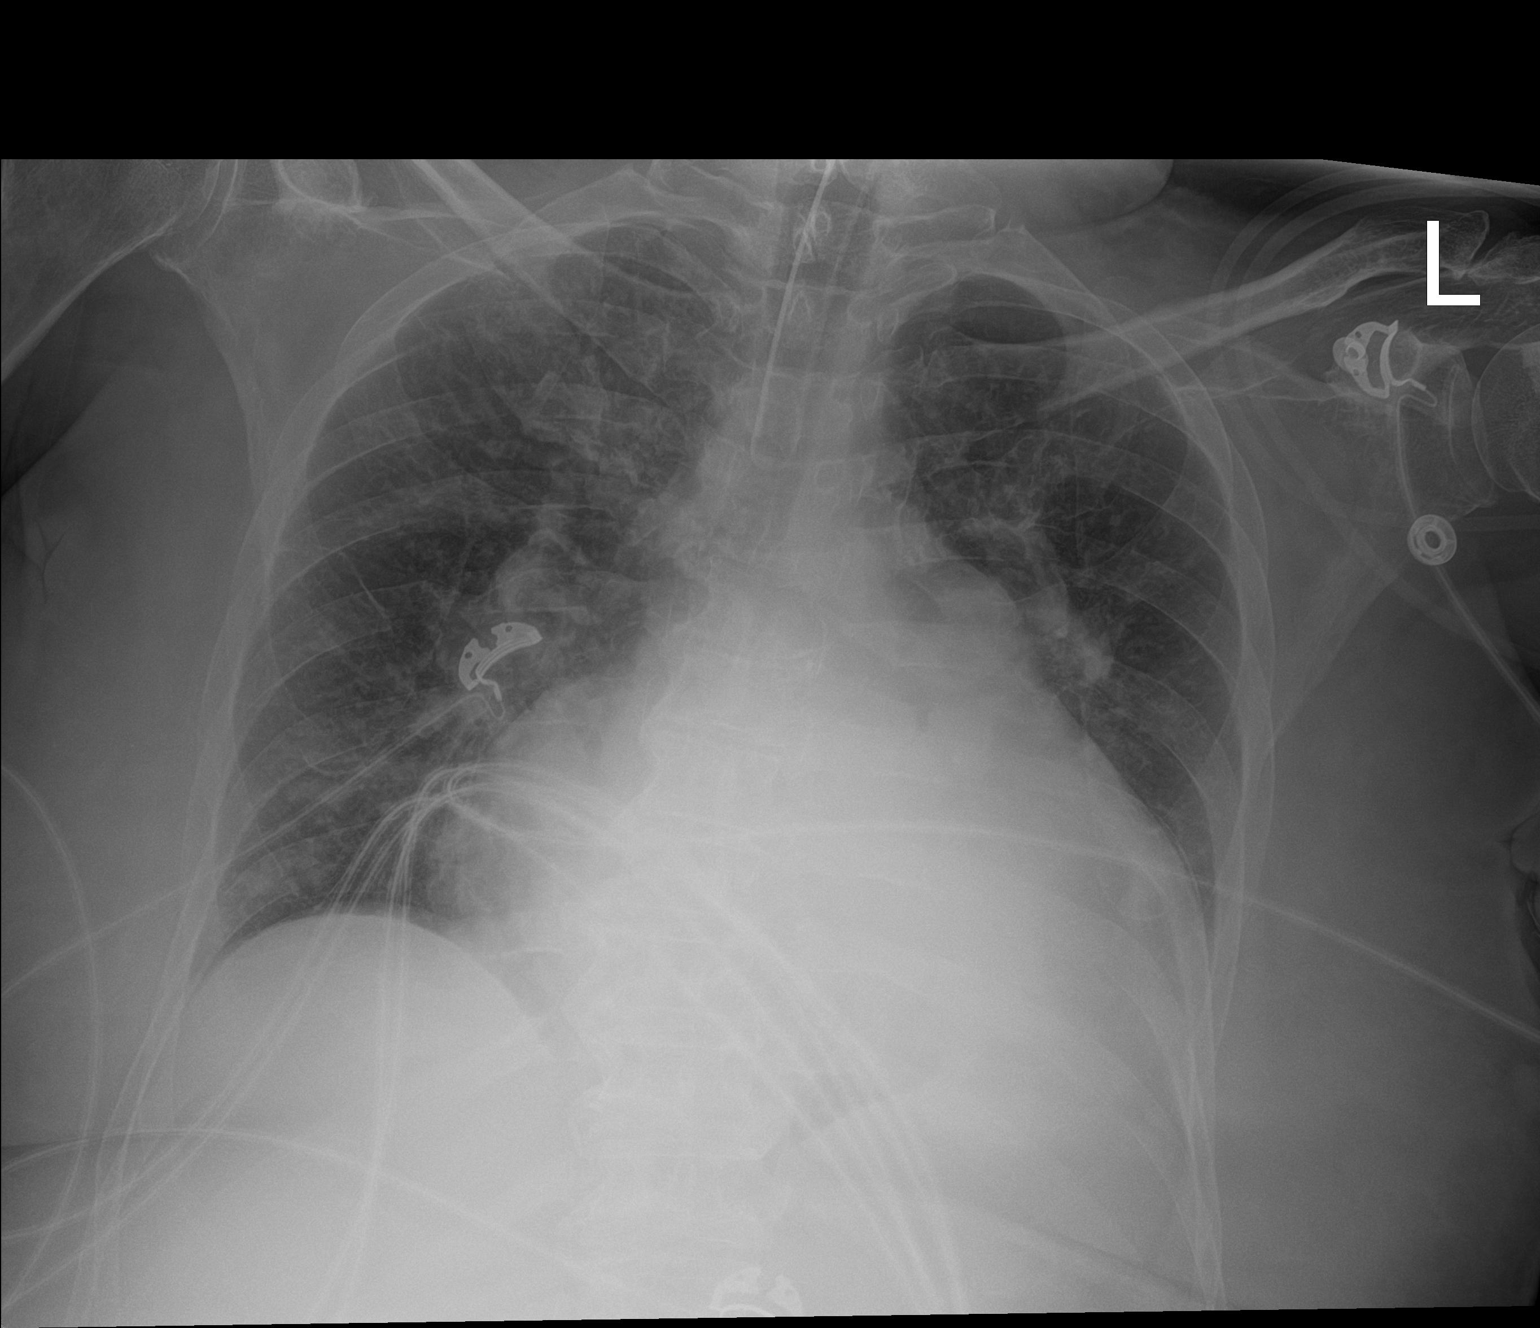

[1 of 1 positions shown; findings below may reference images not displayed]

FINDINGS: Cardiomediastinal silhouette unchanged in size and contour with
cardiomegaly.

Interval placement of endotracheal tube which terminates suitably
above the carina approximately 3.2 cm.

No pneumothorax. No pleural effusion. No new confluent airspace
disease. Coarsened interstitial markings bilaterally with fullness
in the central vasculature.
IMPRESSION: Interval placement of endotracheal tube suitably position above the
carina.

Cardiomegaly with chronic lung changes

## 2019-05-19 SURGERY — IR WITH ANESTHESIA
Anesthesia: General

## 2019-05-19 MED ORDER — IOHEXOL 300 MG/ML  SOLN
150.0000 mL | Freq: Once | INTRAMUSCULAR | Status: AC | PRN
  Administered 2019-05-19: 78 mL via INTRA_ARTERIAL

## 2019-05-19 MED ORDER — CHLORHEXIDINE GLUCONATE CLOTH 2 % EX PADS
6.0000 | MEDICATED_PAD | Freq: Every day | CUTANEOUS | Status: DC
  Administered 2019-05-19 – 2019-05-29 (×10): 6 via TOPICAL

## 2019-05-19 MED ORDER — ORAL CARE MOUTH RINSE
15.0000 mL | OROMUCOSAL | Status: DC
  Administered 2019-05-19 – 2019-05-20 (×12): 15 mL via OROMUCOSAL

## 2019-05-19 MED ORDER — TIROFIBAN HCL IN NACL 5-0.9 MG/100ML-% IV SOLN
INTRAVENOUS | Status: AC
  Filled 2019-05-19: qty 100

## 2019-05-19 MED ORDER — LIDOCAINE 2% (20 MG/ML) 5 ML SYRINGE
INTRAMUSCULAR | Status: DC | PRN
  Administered 2019-05-19: 100 mg via INTRAVENOUS

## 2019-05-19 MED ORDER — EPTIFIBATIDE 20 MG/10ML IV SOLN
INTRAVENOUS | Status: AC
  Filled 2019-05-19: qty 10

## 2019-05-19 MED ORDER — FENTANYL CITRATE (PF) 100 MCG/2ML IJ SOLN
INTRAMUSCULAR | Status: DC | PRN
  Administered 2019-05-19: 100 ug via INTRAVENOUS

## 2019-05-19 MED ORDER — CLEVIDIPINE BUTYRATE 0.5 MG/ML IV EMUL
INTRAVENOUS | Status: AC
  Filled 2019-05-19: qty 50

## 2019-05-19 MED ORDER — ROCURONIUM BROMIDE 100 MG/10ML IV SOLN
INTRAVENOUS | Status: DC | PRN
  Administered 2019-05-19: 30 mg via INTRAVENOUS

## 2019-05-19 MED ORDER — SENNOSIDES-DOCUSATE SODIUM 8.6-50 MG PO TABS
1.0000 | ORAL_TABLET | Freq: Every evening | ORAL | Status: DC | PRN
  Administered 2019-05-22: 1 via ORAL
  Filled 2019-05-19: qty 1

## 2019-05-19 MED ORDER — ACETAMINOPHEN 650 MG RE SUPP: 650.0000 mg | RECTAL | Status: DC | PRN

## 2019-05-19 MED ORDER — PROPOFOL 1000 MG/100ML IV EMUL
5.0000 ug/kg/min | INTRAVENOUS | Status: DC
  Administered 2019-05-19 (×2): 30 ug/kg/min via INTRAVENOUS
  Administered 2019-05-19: 25 ug/kg/min via INTRAVENOUS
  Administered 2019-05-20: 5 ug/kg/min via INTRAVENOUS
  Administered 2019-05-20: 20 ug/kg/min via INTRAVENOUS
  Filled 2019-05-19 (×4): qty 100

## 2019-05-19 MED ORDER — CLEVIDIPINE BUTYRATE 0.5 MG/ML IV EMUL
INTRAVENOUS | Status: DC | PRN
  Administered 2019-05-19: 1 mg/h via INTRAVENOUS

## 2019-05-19 MED ORDER — SODIUM CHLORIDE 0.9 % IV SOLN
INTRAVENOUS | Status: DC
  Administered 2019-05-19 – 2019-05-20 (×2): via INTRAVENOUS

## 2019-05-19 MED ORDER — TICAGRELOR 90 MG PO TABS
ORAL_TABLET | ORAL | Status: AC
  Filled 2019-05-19: qty 2

## 2019-05-19 MED ORDER — ALBUMIN HUMAN 5 % IV SOLN
INTRAVENOUS | Status: DC | PRN
  Administered 2019-05-19: 13:00:00 via INTRAVENOUS

## 2019-05-19 MED ORDER — ACETAMINOPHEN 160 MG/5ML PO SOLN: 650.0000 mg | ORAL | Status: DC | PRN

## 2019-05-19 MED ORDER — LORAZEPAM 2 MG/ML IJ SOLN
INTRAMUSCULAR | Status: AC
  Filled 2019-05-19: qty 1

## 2019-05-19 MED ORDER — CLOPIDOGREL BISULFATE 300 MG PO TABS
ORAL_TABLET | ORAL | Status: AC
  Filled 2019-05-19: qty 1

## 2019-05-19 MED ORDER — ACETAMINOPHEN 325 MG PO TABS: 650.0000 mg | ORAL_TABLET | ORAL | Status: DC | PRN

## 2019-05-19 MED ORDER — ETOMIDATE 2 MG/ML IV SOLN
INTRAVENOUS | Status: DC | PRN
  Administered 2019-05-19: 14 mg via INTRAVENOUS

## 2019-05-19 MED ORDER — INSULIN ASPART 100 UNIT/ML ~~LOC~~ SOLN
0.0000 [IU] | SUBCUTANEOUS | Status: DC
  Administered 2019-05-19: 3 [IU] via SUBCUTANEOUS
  Administered 2019-05-22: 4 [IU] via SUBCUTANEOUS
  Administered 2019-05-22: 17:00:00 3 [IU] via SUBCUTANEOUS
  Administered 2019-05-23: 2 [IU] via SUBCUTANEOUS
  Administered 2019-05-23 – 2019-05-26 (×10): 3 [IU] via SUBCUTANEOUS
  Administered 2019-05-27: 4 [IU] via SUBCUTANEOUS
  Administered 2019-05-27 – 2019-05-28 (×2): 3 [IU] via SUBCUTANEOUS
  Administered 2019-05-28: 4 [IU] via SUBCUTANEOUS
  Administered 2019-05-29 (×2): 3 [IU] via SUBCUTANEOUS

## 2019-05-19 MED ORDER — CEFAZOLIN SODIUM-DEXTROSE 2-4 GM/100ML-% IV SOLN
INTRAVENOUS | Status: AC
  Filled 2019-05-19: qty 100

## 2019-05-19 MED ORDER — NITROGLYCERIN 1 MG/10 ML FOR IR/CATH LAB
INTRA_ARTERIAL | Status: AC
  Filled 2019-05-19: qty 10

## 2019-05-19 MED ORDER — SPIRONOLACTONE 12.5 MG HALF TABLET
12.5000 mg | ORAL_TABLET | Freq: Every day | ORAL | Status: DC
  Filled 2019-05-19: qty 1

## 2019-05-19 MED ORDER — CLEVIDIPINE BUTYRATE 0.5 MG/ML IV EMUL: 0.0000 mg/h | INTRAVENOUS | Status: DC

## 2019-05-19 MED ORDER — DEXAMETHASONE SODIUM PHOSPHATE 10 MG/ML IJ SOLN
INTRAMUSCULAR | Status: DC | PRN
  Administered 2019-05-19: 4 mg via INTRAVENOUS

## 2019-05-19 MED ORDER — FUROSEMIDE 20 MG PO TABS
40.0000 mg | ORAL_TABLET | Freq: Two times a day (BID) | ORAL | Status: DC
  Administered 2019-05-20: 40 mg
  Filled 2019-05-19: qty 2

## 2019-05-19 MED ORDER — CARVEDILOL 3.125 MG PO TABS
6.2500 mg | ORAL_TABLET | Freq: Two times a day (BID) | ORAL | Status: DC
  Filled 2019-05-19: qty 2

## 2019-05-19 MED ORDER — SPIRONOLACTONE 12.5 MG HALF TABLET
12.5000 mg | ORAL_TABLET | Freq: Every day | ORAL | Status: DC
  Administered 2019-05-20: 12.5 mg
  Filled 2019-05-19: qty 1

## 2019-05-19 MED ORDER — PROPOFOL 500 MG/50ML IV EMUL
INTRAVENOUS | Status: DC | PRN
  Administered 2019-05-19: 20 ug/kg/min via INTRAVENOUS

## 2019-05-19 MED ORDER — CARVEDILOL 12.5 MG PO TABS
6.2500 mg | ORAL_TABLET | Freq: Two times a day (BID) | ORAL | Status: DC
  Administered 2019-05-19: 6.25 mg via ORAL
  Filled 2019-05-19: qty 1

## 2019-05-19 MED ORDER — VITAMIN D (ERGOCALCIFEROL) 1.25 MG (50000 UNIT) PO CAPS
50000.0000 [IU] | ORAL_CAPSULE | ORAL | Status: DC
  Administered 2019-05-23 – 2019-05-30 (×2): 50000 [IU] via ORAL
  Filled 2019-05-19 (×2): qty 1

## 2019-05-19 MED ORDER — STROKE: EARLY STAGES OF RECOVERY BOOK
Freq: Once | Status: AC
  Administered 2019-05-19: 16:00:00
  Filled 2019-05-19: qty 1

## 2019-05-19 MED ORDER — SODIUM CHLORIDE 0.9 % IV SOLN
INTRAVENOUS | Status: DC | PRN
  Administered 2019-05-19: 50 ug/min via INTRAVENOUS
  Administered 2019-05-19: 75 ug/min via INTRAVENOUS

## 2019-05-19 MED ORDER — CHLORHEXIDINE GLUCONATE 0.12% ORAL RINSE (MEDLINE KIT)
15.0000 mL | Freq: Two times a day (BID) | OROMUCOSAL | Status: DC
  Administered 2019-05-19 – 2019-05-29 (×18): 15 mL via OROMUCOSAL

## 2019-05-19 MED ORDER — FENTANYL CITRATE (PF) 100 MCG/2ML IJ SOLN
INTRAMUSCULAR | Status: AC
  Filled 2019-05-19: qty 2

## 2019-05-19 MED ORDER — SODIUM CHLORIDE 0.9 % IV SOLN
INTRAVENOUS | Status: DC
  Administered 2019-05-20 – 2019-05-25 (×6): via INTRAVENOUS

## 2019-05-19 MED ORDER — ASPIRIN 81 MG PO CHEW
CHEWABLE_TABLET | ORAL | Status: AC
  Filled 2019-05-19: qty 1

## 2019-05-19 MED ORDER — SUCCINYLCHOLINE CHLORIDE 200 MG/10ML IV SOSY
PREFILLED_SYRINGE | INTRAVENOUS | Status: DC | PRN
  Administered 2019-05-19: 100 mg via INTRAVENOUS

## 2019-05-19 MED ORDER — CEFAZOLIN SODIUM-DEXTROSE 2-3 GM-%(50ML) IV SOLR
INTRAVENOUS | Status: DC | PRN
  Administered 2019-05-19: 2 g via INTRAVENOUS

## 2019-05-19 MED ORDER — FUROSEMIDE 20 MG PO TABS
40.0000 mg | ORAL_TABLET | Freq: Two times a day (BID) | ORAL | Status: DC
  Administered 2019-05-19: 40 mg via ORAL
  Filled 2019-05-19: qty 2

## 2019-05-19 MED ORDER — IOHEXOL 350 MG/ML SOLN
40.0000 mL | Freq: Once | INTRAVENOUS | Status: AC | PRN
  Administered 2019-05-19: 40 mL via INTRAVENOUS

## 2019-05-19 MED ORDER — ACETAMINOPHEN 325 MG PO TABS
650.0000 mg | ORAL_TABLET | ORAL | Status: DC | PRN
  Administered 2019-05-21 – 2019-05-29 (×8): 650 mg via ORAL
  Filled 2019-05-19 (×8): qty 2

## 2019-05-19 MED ADMIN — Nitroglycerin IV Soln 100 MCG/ML in D5W: 25 ug | INTRA_ARTERIAL | NDC 99999070007

## 2019-05-19 NOTE — Anesthesia Procedure Notes (Signed)
Arterial Line Insertion Start/End9/24/2020 1:15 PM, 05/19/2019 1:31 PM Performed by: Myrtie Soman, MD, anesthesiologist  Patient location: OR. Emergency situation Patient sedated Right, radial was placed Catheter size: 20 G  Attempts: 4 Procedure performed using ultrasound guided technique. Following insertion, dressing applied and Biopatch. Post procedure assessment: normal  Patient tolerated the procedure well with no immediate complications. Additional procedure comments: Placed under general anesthesia. Multiple attempts bilaterally by CRNAs. Marland Kitchen

## 2019-05-19 NOTE — Transfer of Care (Signed)
Immediate Anesthesia Transfer of Care Note  Patient: Jorie Zee  Procedure(s) Performed: IR WITH ANESTHESIA (N/A )  Patient Location: NICU  Anesthesia Type:General  Level of Consciousness: Patient remains intubated per anesthesia plan  Airway & Oxygen Therapy: Patient remains intubated per anesthesia plan and Patient placed on Ventilator (see vital sign flow sheet for setting)  Post-op Assessment: Post -op Vital signs reviewed and stable  Post vital signs: stable  Last Vitals:  Vitals Value Taken Time  BP    Temp    Pulse 67 05/19/19 1429  Resp 15 05/19/19 1435  SpO2 89 % 05/19/19 1429  Vitals shown include unvalidated device data.  Last Pain: There were no vitals filed for this visit.       Complications: No apparent anesthesia complications

## 2019-05-19 NOTE — Progress Notes (Signed)
Copies of Health care POA and living will placed in paper chart.

## 2019-05-19 NOTE — Anesthesia Preprocedure Evaluation (Signed)
Anesthesia Evaluation  Patient identified by MRN, date of birth, ID band Patient awake    Reviewed: Allergy & Precautions, NPO status , Patient's Chart, lab work & pertinent test results  Airway Mallampati: II  TM Distance: >3 FB Neck ROM: Full    Dental no notable dental hx.    Pulmonary neg pulmonary ROS,    Pulmonary exam normal breath sounds clear to auscultation       Cardiovascular hypertension, +CHF  Normal cardiovascular exam Rhythm:Regular Rate:Normal  EF 20%   Neuro/Psych CVA negative psych ROS   GI/Hepatic Neg liver ROS, GERD  ,  Endo/Other  negative endocrine ROS  Renal/GU negative Renal ROS  negative genitourinary   Musculoskeletal negative musculoskeletal ROS (+)   Abdominal   Peds negative pediatric ROS (+)  Hematology negative hematology ROS (+)   Anesthesia Other Findings   Reproductive/Obstetrics negative OB ROS                             Anesthesia Physical Anesthesia Plan  ASA: IV and emergent  Anesthesia Plan: General   Post-op Pain Management:    Induction: Intravenous and Rapid sequence  PONV Risk Score and Plan: 3 and Ondansetron, Dexamethasone and Treatment may vary due to age or medical condition  Airway Management Planned: Oral ETT  Additional Equipment:   Intra-op Plan:   Post-operative Plan: Possible Post-op intubation/ventilation  Informed Consent: I have reviewed the patients History and Physical, chart, labs and discussed the procedure including the risks, benefits and alternatives for the proposed anesthesia with the patient or authorized representative who has indicated his/her understanding and acceptance.     Dental advisory given  Plan Discussed with: CRNA and Surgeon  Anesthesia Plan Comments:         Anesthesia Quick Evaluation

## 2019-05-19 NOTE — Consult Note (Signed)
NAME:  Breanna Bennett, MRN:  [id number], DOB:  June 16, 1956, LOS: 0 ADMISSION DATE:  05/19/2019, CONSULTATION DATE:  05/19/2019 REFERRING MD:  Dr. Otelia Limes, neurology, CHIEF COMPLAINT:  Left side weakness and speech difficulty   Brief History   63 year old female who was transported from [place] ED to [facility] hospital due to stroke 2/2 acute right MCA occlusion. She underwent arterial thrombectomy by IR at [facility]. PCCM consulted given several comorbidities and for mechanical ventilation post procedure and further monitoring.  History of present illness   63 year old female with past medical history of morbid obesity, CHF (nonischemic cardiomyopathy), HLD, HTN, prediabetes, presented to [facility] emergency department with acute left-sided weakness, and speech difficulty. She was last seen 9/23 at 9 PM by her family before she went to bed. She woke up at 1 AM and fell and injured her right hip.  She was found by her family on the floor and was taken to [facility] ER. At that time, patient was awake and able to explain the fall and hip injury but had speech difficulty and left side hemiplegia, left hemisensory loss and facial drrop. She was evaluated by telemetry neurology at [facility], and was not a candidate for TPA based on the time window. CT head showed no acute hypodensity but CTA of head and neck revealed a right MCA occlusion.  She was emergently transferred to [facility] to perform CT perfusion study and possible intervention by IR.  Past Medical History  chronic systolic CHF, HLD, HTN, NICM, morbid obesity and pre-diabetes.   Significant Hospital Events   9/24 Arterial thrombectomy of right MCA occlusion  Consults:  IR PCCM  Procedures:  9/24 percutaneous arterial thrombectomy: RT CCA arteriogram followed by complete revascularization of RT MCA M 1 occlusion with x 1 pass with 40 mm Solitaire X retriever device achieving a TICI 3 revascularization  Significant Diagnostic Tests:   9/24 CT cerebral perfusion with contrast: : Nondiagnostic CT perfusion. 9/24 brain MRI without contrast> 9/24 CT head without contrast> DG hip unilateral>  Micro Data:  9/24 SARS COVID-19  > negative 9/24 MRSA PCR> negative Antimicrobials:    Interim history/subjective:  Patient is sedated post IR procedure, withdraw to tactile stimuli  Objective   There were no vitals taken for this visit.       Intake/Output Summary (Last 24 hours) at 05/19/2019 1358 Last data filed at 05/19/2019 1356 Gross per 24 hour  Intake 850 ml  Output 5 ml  Net 845 ml   There were no vitals filed for this visit.  Examination: General: Sedated, Appears well-developed and well-nourished. No distress.  Head: Normocephalic and atraumatic.  Eyes: Exophthalmos, Pupils symmetric, nl size and reactive to light Cardiovascular: Decreased S2, no murmur Respiratory: On MV, no crackle,  no wheezes.  GI: Soft. Bowel sounds are normal. No distension. There is no tenderness.  Musculoskeletal: 2+ pitting edema Neurological: Is sedated, localizes tactile stimuli  Resolved Hospital Problem list     Assessment & Plan:  MCA stroke secondary to right MCA occlusion: Status post revascularization on arrival to [facility] 9/24  Risk factors: HLD, HTN, morbid obesity, CHF, prediabetes -Per neurology -No antiplatelet for at least 24 hours following VIR. (Can restart if repeat CT head at 24 hours is negative for hemorrhagic conversion.) -PT OT eval and treat -NPO -Speech eval -IV fluids at 75 mL/h -TTE pending -Hemoglobin A1c pending -Lipid panel pending -Cardiac monitoring -UDS pending  Right hip pain 2/2 fall: Concerning for  Fx. No leg length discrepancy Distal pulses are present  -F/u hip x-ray  CHF: Last heart cath 01/13/2019: dilated nonischemic cardiomyopathy, she might be a candidate for BiV ICD/CRT. Hypervolemic on exam (LEE) EKG with PVC. Electrolytes  normal.   -BNP -F/u TTE  -CXR -Lasix 40 mg p.o. twice daily -Carvedilol 6.25 mg p.o. twice daily -Spironolactone 12.5 mg daily -Cardiac monitoring -Strict I's/O -Weight daily -F/u BMP -Mg -Trend Troponin  Exophthalmos -TSH  Pre DM: -SSI -CBG monitoring  Best practice:  Diet: N.p.o. speech eval Pain/Anxiety/Delirium protocol (if indicated): None VAP protocol (if indicated): None DVT prophylaxis: SCDs (holding anticoagulation for 24 hours post IR thrombectomy) GI prophylaxis: Senna Glucose control: SSI, CBG Mobility: Bedrest Code Status: Full Family Communication: Dr. Estanislado Pandy spoke with Daughter Donnie Gedeon [phone] Disposition: Neuro ICU  Labs   CBC: Recent Labs  Lab 05/19/19 1102 05/19/19 1103  WBC 5.6  --   NEUTROABS 3.5  --   HGB 16.1* 17.0*  HCT 48.1* 50.0*  MCV 85.7  --   PLT 208  --     Basic Metabolic Panel: Recent Labs  Lab 05/19/19 1102 05/19/19 1103  NA 141 141  K 4.3 4.1  CL 105 104  CO2 23  --   GLUCOSE 153* 146*  BUN 16 17  CREATININE 1.09* 0.90  CALCIUM 9.0  --    GFR: CrCl cannot be calculated (Unknown ideal weight.). Recent Labs  Lab 05/19/19 1102  WBC 5.6    Liver Function Tests: Recent Labs  Lab 05/19/19 1102  AST 27  ALT 21  ALKPHOS 119  BILITOT 1.8*  PROT 6.1*  ALBUMIN 3.1*   No results for input(s): LIPASE, AMYLASE in the last 168 hours. No results for input(s): AMMONIA in the last 168 hours.  ABG    Component Value Date/Time   PHART 7.586 (H) 01/13/2019 1502   PCO2ART 30.3 (L) 01/13/2019 1502   PO2ART 105.0 01/13/2019 1502   HCO3 28.8 (H) 01/13/2019 1502   TCO2 24 05/19/2019 1103   O2SAT 99.0 01/13/2019 1502     Coagulation Profile: Recent Labs  Lab 05/19/19 1102  INR 1.4*    Cardiac Enzymes: No results for input(s): CKTOTAL, CKMB, CKMBINDEX, TROPONINI in the last 168 hours.  HbA1C: No results found for: HGBA1C  CBG: Recent Labs  Lab 05/19/19 1051  GLUCAP 141*    Review of  Systems:     Past Medical History  She,  has a past medical history of Chronic systolic CHF (congestive heart failure) (HCC), Elevated hemoglobin (HCC), GERD (gastroesophageal reflux disease), Hyperlipidemia, Hypertension, LBBB (left bundle branch block), Mild renal insufficiency, Mitral regurgitation, NICM (nonischemic cardiomyopathy) ([place]), Pre-diabetes, and Tricuspid regurgitation.   Surgical History    Past Surgical History:  Procedure Laterality Date  . RIGHT/LEFT HEART CATH AND CORONARY ANGIOGRAPHY N/A 01/13/2019   Procedure: RIGHT/LEFT HEART CATH AND CORONARY ANGIOGRAPHY;  Surgeon: Lorretta Harp, MD;  Location: [facility] CV LAB;  Service: Cardiovascular;  Laterality: N/A;  . TUBAL LIGATION       Social History   reports that she has never smoked. She has never used smokeless tobacco. She reports that she does not drink alcohol or use drugs.   Family History   Her family history includes CAD in her mother; Colon cancer in her father; Heart failure in her mother; Hypertension in her mother.   Allergies Allergies  Allergen Reactions  . Lisinopril Cough     Home Medications  Prior to Admission medications   Medication  Sig Start Date End Date Taking? Authorizing Provider  albuterol (VENTOLIN HFA) 108 (90 Base) MCG/ACT inhaler Inhale 2 puffs into the lungs every 4 (four) hours as needed for shortness of breath. 11/08/18   [provider]  ASPIRIN 81 PO Take 1 tablet by mouth daily.    [provider]  atorvastatin (LIPITOR) 10 MG tablet Take 10 mg by mouth daily. 10/19/18   [provider]  carvedilol (COREG) 6.25 MG tablet TAKE 1 TABLET(6.25 MG) BY MOUTH TWICE DAILY WITH A MEAL 05/05/19   Georgeanna Lea, MD  famotidine (PEPCID) 40 MG tablet Take 1 tablet (40 mg total) by mouth daily. 10/19/18   [provider]  fluticasone (FLONASE) 50 MCG/ACT nasal spray Place 2 sprays into both nostrils daily. 01/03/19   [provider]   furosemide (LASIX) 40 MG tablet Take 1 tablet (40 mg total) by mouth 2 (two) times daily. 05/16/19   Georgeanna Lea, MD  loratadine (CLARITIN) 10 MG tablet Take 10 mg by mouth daily. 11/08/18   [provider]  montelukast (SINGULAIR) 10 MG tablet Take 10 mg by mouth at bedtime.    [provider]  spironolactone (ALDACTONE) 25 MG tablet Take 0.5 tablets (12.5 mg total) by mouth daily. 03/24/19   Dunn, Tacey Ruiz, PA-C  Vitamin D, Ergocalciferol, (DRISDOL) 1.25 MG (50000 UT) CAPS capsule Take 1 capsule by mouth once a week. 03/30/19   [provider]     Critical care time:    Teola Bradley, MD IM PGY-2 Pager [phone]

## 2019-05-19 NOTE — Code Documentation (Signed)
63yo female arriving to [facility] via [place] at [phone]. Patient from [facility] where she presented with left sided weakness and slurred speech. LKW yesterday at 2100 prior to bed. Patient sustained a fall this morning and was found by family. Patient was outside the window for treatment with tPA at [place] showed a right MCA occlusion and patient was transferred to [facility] for CTP to evaluate patient for endovascular intervention. Code stroke paged on arrival to [facility]. Stroke RN responded to bedside in CT. NIHSS 14, see documentation for details and code stroke times. Patient with delay in CTP imaging due to multiple attempts to place appropriate PIV eventually requiring EDP to place ultrasound-guided PIV. CTP completed and patient transported back to the ED. CTP with error, therefore, patient returned to CT for repeat CTP. Patient given Ativan 2mg  IVP per Dr. Cheral Marker. Patient placed on oxygen via nasal cannula due to desaturations into the 80s and difficulty laying flat on the CT scanner. CTP repeated, however, with error again. Patient taken back to the ED. Decision made to proceed to IR and patient transported to IR by CRNAs.

## 2019-05-19 NOTE — Progress Notes (Signed)
Patient ID: Breanna Bennett, female   DOB: [birth year], 63 y.o.   MRN: [id number] INR. Post treatment CT brain reveals no ICH or gross mass effect. .Patient left intubated because of habitus and CHF. RT groin hemostasis achieved wih manual compression. Distal pulses RT DP palpable. RT PT dopplerable. Lt DP and PT palpable. S.[name] MD

## 2019-05-19 NOTE — Progress Notes (Signed)
Care given to  anesthesia

## 2019-05-19 NOTE — Consult Note (Addendum)
Referring Physician: Dr. Rosalia Hammers    Chief Complaint: Left sided weakness, facial droop and sensory loss.   HPI: Breanna Bennett is an 63 y.o. female presenting in transfer from the [facility] ED with acute onset of left hemiplegia, left hemisensory loss and left facial droop. LKN was 9 PM last night prior to going to sleep. Symptoms were present on awakening. She was evaluated by Teleneurology at [facility] and was not a candidate for tPA based on time criteria. CTA of head and neck revealed a right MCA occlusion. 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 NIHSS was 8 at [facility]. She was emergently transported to [facility] for CT perfusion study and possible clot retrieval with VIR.   Her PMHx includes chronic systolic CHF, HLD, HTN, NICM, morbid obesity and pre-diabetes.   Home meds include ASA and atorvastatin.   LSN: 9:00 PM on Wednesday. tPA Given: No: Outside of time window.   Past Medical History:  Diagnosis Date  . Chronic systolic CHF (congestive heart failure) (HCC)   . Elevated hemoglobin (HCC)   . GERD (gastroesophageal reflux disease)   . Hyperlipidemia   . Hypertension   . LBBB (left bundle branch block)   . Mild renal insufficiency   . Mitral regurgitation   . NICM (nonischemic cardiomyopathy) (HCC)   . Pre-diabetes   . Tricuspid regurgitation     Past Surgical History:  Procedure Laterality Date  . RIGHT/LEFT HEART CATH AND CORONARY ANGIOGRAPHY N/A 01/13/2019   Procedure: RIGHT/LEFT HEART CATH AND CORONARY ANGIOGRAPHY;  Surgeon: Runell Gess, MD;  Location: [facility] INVASIVE CV LAB;  Service: Cardiovascular;  Laterality: N/A;  . TUBAL LIGATION      Family History  Problem Relation Age of Onset  . CAD Mother   . Heart failure Mother   . Hypertension Mother   . Colon cancer Father    Social History:  reports that she has never smoked. She has never used smokeless tobacco. She reports that she  does not drink alcohol or use drugs.  Allergies:  Allergies  Allergen Reactions  . Lisinopril Cough    Home Medications:    ROS: As per HPI. Detailed ROS deferred in the context of acuity of presentation.   Physical Examination: There were no vitals taken for this visit.  HEENT: [place]/AT Lungs: Tachypneic. CTA.  Ext: Pitting edema bilateral lower extremities.   Neurologic Examination: Mental Status: Awake and alert. Speech dysarthric but fluent. No naming deficit noted. Able to follow all commands. Left sided neglect and anosognosia.  Cranial Nerves: II:  Left sided visual field cut. Tracks and fixates normally with left eye.  III,IV, VI: Exotropic. Right eye with delayed adduction relative to left. Rightward gaze preference but able to cross the midline to left with some difficulty. No nystagmus.  V,VII: Left facial droop. Sensation decreased on the left.  VIII: hearing intact to voice IX,X: No hoarseness noted.  XI: Weak shoulder shrug on the left.  XII: Tongue deviates to left when protruded.  Motor: RUE 5/5 RLE 5/5 LUE 3/5 LLE 3/5 Sensory: Absent temp and FT sensation to LUE and LLE.  Deep Tendon Reflexes:  2+ bilateral upper extremities.  3+ right patella, 2+ left patella Plantars: Equivocal on the right, mute on the left.  Right: downgoing   Left: downgoing Cerebellar: No ataxia with FNF on right. Severe ataxia with FNF on left.  Gait: Deferred   Results for orders  placed or performed during the hospital encounter of 05/19/19 (from the past 48 hour(s))  CBG monitoring, ED     Status: Abnormal   Collection Time: 05/19/19 10:51 AM  Result Value Ref Range   Glucose-Capillary 141 (H) 70 - 99 mg/dL   No results found.  Assessment: 63 y.o. female with right MCA stroke secondary to right MCA occlusion. Presents emergently from [facility] as a Code Stroke for possible clot retrieval procedure.  1. NIHSS of 8 at OSH 2. Exam continues to show significant left sided deficits,  consistent with a large region of right MCA territory hypoperfusion.  3. Initial attempt at CTP failed due to patient movement. Administering 2 mg IV Ativan and will reattempt. Benefits of diagnostic information for emergent patient care significantly outweigh risks to renal function with additional contrast administration. Will start IVF.  4. Stroke Risk Factors - Chronic systolic CHF, HLD, HTN, NICM, morbid obesity and pre-diabetes. 5. Covid negative per report from [facility].  6. Prediabetes 7. Right hip pain. Possible hip fracture.   Plan: 1. Second attempt at [facility] with data that again could not be processed. Decision has been made to proceed with VIR based on ASPECTS of 10 on initial CT head with continued clinical deficits. Discussed with Dr. Estanislado Pandy.  2. Sliding scale insulin 3. Plain films of right hip 4. Following VIR, will admit to the ICU under the Neurology service. Orders to include BP management and frequent neuro checks.  5. No antiplatelet meds or anticoagulants for at least 24 hours following VIR. Can restart if repeat CT head at 24 hours is negative for hemorrhagic conversion.   60 minutes spent in the emergent neurological evaluation and management of this critically ill patient. Time spent included coordination of care.   @Electronically  signed: Dr. Kerney Elbe  05/19/2019, 10:59 AM

## 2019-05-19 NOTE — Progress Notes (Signed)
RT NOTE: RT decreased RR to 16 and FIO2 to 40% per ABG results. Vitals are stable. RT will continue to monitor.

## 2019-05-19 NOTE — ED Provider Notes (Addendum)
[facility] EMERGENCY DEPARTMENT Provider Note   CSN: [id number] Arrival date & time: 05/19/19  1049     History   Chief Complaint No chief complaint on file.   HPI Breanna Bennett is a 63 y.o. female.     HPI  63 yo female transport from [place] ED with report of stroke.  Accepted by Dr. Otelia Limes.  Lkn 2100 last night.  Patient woke up at 0100 and fell injuring right hip.  She was found by family and taken to ER arriving at 0700. Patient awake and speaks to me on initial evaluation at bridge but unable to get clear complete history other than " I couldn't walk and I hurt my hip."  Dr. Otelia Limes at bedside and states patient to go for ct perfusion study and likely to IR. 12:01 PM Reevaluate patient after CT.  However, she has received Ativan and is noncommunicative at this time.  Review of records from [facility] revealed that she presented with left-sided weakness.  She was felt to have an M1 occlusion and was transferred here secondary to that. Was unable to tell if assessment of the right hip was done Past Medical History:  Diagnosis Date  . Chronic systolic CHF (congestive heart failure) (HCC)   . Elevated hemoglobin (HCC)   . GERD (gastroesophageal reflux disease)   . Hyperlipidemia   . Hypertension   . LBBB (left bundle branch block)   . Mild renal insufficiency   . Mitral regurgitation   . NICM (nonischemic cardiomyopathy) (HCC)   . Pre-diabetes   . Tricuspid regurgitation     Patient Active Problem List   Diagnosis Date Noted  . Mitral regurgitation 05/04/2019  . Nonischemic cardiomyopathy [facility]) cardiac catheterization May 2020 showed normal coronaries, ejection fraction 30% 02/09/2019  . Chronic combined systolic and diastolic congestive heart failure, NYHA class 2 (HCC) 02/09/2019  . Acute systolic CHF (congestive heart failure) (HCC) 01/12/2019  . Essential hypertension 01/12/2019  . Class 2 severe obesity due to excess calories with  serious comorbidity and body mass index (BMI) of 38.0 to 38.9 in adult (HCC) 10/19/2018  . Gastroesophageal reflux disease 10/19/2018  . Hyperlipidemia 10/19/2018    Past Surgical History:  Procedure Laterality Date  . RIGHT/LEFT HEART CATH AND CORONARY ANGIOGRAPHY N/A 01/13/2019   Procedure: RIGHT/LEFT HEART CATH AND CORONARY ANGIOGRAPHY;  Surgeon: Runell Gess, MD;  Location: [facility] INVAS[facility] CV [facility];  Service: Cardiovascular;  Laterality: N/A;  . TUBAL LIGATION       OB History   No obstetric history on file.      Home Medications    Prior to Admission medications   Medication Sig Start Date End Date Taking? Authorizing Provider  albuterol (VENTOLIN HFA) 108 (90 Base) MCG/ACT inhaler Inhale 2 puffs into the lungs every 4 (four) hours as needed for shortness of breath. 11/08/18   [provider]  ASPIRIN 81 PO Take 1 tablet by mouth daily.    [provider]  atorvastatin (LIPITOR) 10 MG tablet Take 10 mg by mouth daily. 10/19/18   [provider]  carvedilol (COREG) 6.25 MG tablet TAKE 1 TABLET(6.25 MG) BY MOUTH TWICE DAILY WITH A MEAL 05/05/19   Georgeanna Lea, MD  famotidine (PEPCID) 40 MG tablet Take 1 tablet (40 mg total) by mouth daily. 10/19/18   [provider]  fluticasone (FLONASE) 50 MCG/ACT nasal spray Place 2 sprays into both nostrils daily. 01/03/19   [provider]  furosemide (LASIX) 40 MG tablet  Take 1 tablet (40 mg total) by mouth 2 (two) times daily. 05/16/19   Georgeanna Lea, MD  loratadine (CLARITIN) 10 MG tablet Take 10 mg by mouth daily. 11/08/18   [provider]  montelukast (SINGULAIR) 10 MG tablet Take 10 mg by mouth at bedtime.    [provider]  spironolactone (ALDACTONE) 25 MG tablet Take 0.5 tablets (12.5 mg total) by mouth daily. 03/24/19   Dunn, Tacey Ruiz, PA-C  Vitamin D, Ergocalciferol, (DRISDOL) 1.25 MG (50000 UT) CAPS capsule Take 1 capsule by mouth once a week. 03/30/19   [provider]    Family History Family History  Problem Relation Age of Onset  . CAD Mother   . Heart failure Mother   . Hypertension Mother   . Colon cancer Father     Social History Social History   Tobacco Use  . Smoking status: Never Smoker  . Smokeless tobacco: Never Used  Substance Use Topics  . Alcohol use: Never    Frequency: Never  . Drug use: Never     Allergies   Lisinopril   Review of Systems Review of Systems  Unable to perform ROS: Acuity of condition     Physical Exam Updated Vital Signs There were no vitals taken for this visit.  Physical Exam Constitutional:      Appearance: She is obese.  HENT:     Head: Normocephalic and atraumatic.     Right Ear: External ear normal.     Left Ear: External ear normal.     Nose: Nose normal.     Mouth/Throat:     Pharynx: Oropharynx is clear.  Neck:     Musculoskeletal: Normal range of motion.  Pulmonary:     Effort: Pulmonary effort is normal.  Skin:    General: Skin is warm.  Neurological:     Mental Status: She is alert.     Comments: Right arm lifted higher than left Unable to lift right leg off bed- ? Secondary to pain Left leg with some extension against gravity      ED Treatments / Results  Labs (all labs ordered are listed, but only abnormal results are displayed) Labs Reviewed  CBG MONITORING, ED - Abnormal; Notable for the following components:      Result Value   Glucose-Capillary 141 (*)    All other components within normal limits    EKG None Not done in ED Radiology No results found.  Procedures Procedures (including critical care time)  Medications Ordered in ED Medications  tirofiban (AGGRASTAT) 5-0.9 MG/100ML-% injection (has no administration in time range)  aspirin 81 MG chewable tablet (has no administration in time range)  ticagrelor (BRILINTA) 90 MG tablet (has no administration in time range)  clopidogrel (PLAVIX) 300 MG tablet (has no administration in  time range)  nitroGLYCERIN 100 mcg/mL intra-arterial injection (has no administration in time range)  fentaNYL (SUBLIMAZE) 100 MCG/2ML injection (has no administration in time range)  eptifibatide (INTEGRILIN) 20 MG/10ML injection (has no administration in time range)  ceFAZolin (ANCEF) 2-4 GM/100ML-% IVPB (has no administration in time range)     Initial Impression / Assessment and Plan / ED Course  I have reviewed the triage vital signs and the nursing notes.  Pertinent labs & imaging results that were available during my care of the patient were reviewed by me and considered in my medical decision making (see chart for details).       Patient to ct Neurology and interventional  neurology at bedside.  They plan on taking patient interventional radiology Dr. Cheral Marker aware of patient's complaint of hip pain and will evaluate after acute neurological interventions Final Clinical Impressions(s) / ED Diagnoses   Final diagnoses:  Stroke [facility])    ED Discharge Orders    None       Pattricia Boss, MD 05/19/19 1202    Pattricia Boss, MD 05/30/19 [phone]

## 2019-05-19 NOTE — Progress Notes (Signed)
SLP Cancellation Note  Patient Details Name: Aviva Wolfer MRN: [id number] DOB: 1956-08-07   Cancelled treatment:       Reason Eval/Treat Not Completed: Medical issues which prohibited therapy;Patient not medically ready(Pt currently intubated. SLP will follow up. )  Doyle Kunath I. Hardin Negus, Sarah Ann, [place] Office number [phone] Pager [place] 05/19/2019, 3:23 PM

## 2019-05-19 NOTE — ED Notes (Signed)
Dr. Estanislado Pandy at bedside, spoke with Daughter Jayline Kilburg [phone]. (cell) permission given to treat. Patient transported to IR

## 2019-05-19 NOTE — Progress Notes (Signed)
Patient ID: Breanna Bennett, female   DOB: [birth year], 63 y.o.   MRN: [id number] INR. 63 Y RT H F LSW 900pm LN MRSS 2 to 3 New onset of left sided weakness and lt sided hemisensory loss. CT Brain NO ICH ASPECTS 10 CTA occluded RT MCA M 1 . CTP and rapid maps unobtainable. On the basis of ASPECTS endovascular treatment D/W patients daughter.on the phone. Procedure,reasons and alternatives reviewed. Risks of ICH of 10 % ,neurological decline ,death and inability to revascularize discussed. Daughter expressed understanding and  consented to the treatment. S.Jong Rickman MD

## 2019-05-19 NOTE — Anesthesia Procedure Notes (Signed)
Procedure Name: Intubation Date/Time: 05/19/2019 12:14 PM Performed by: Lavell Luster, CRNA Pre-anesthesia Checklist: Patient identified, Emergency Drugs available, Suction available, Patient being monitored and Timeout performed Patient Re-evaluated:Patient Re-evaluated prior to induction Oxygen Delivery Method: Circle system utilized Preoxygenation: Pre-oxygenation with 100% oxygen Induction Type: IV induction, Cricoid Pressure applied and Rapid sequence Laryngoscope Size: Mac, 3 and Glidescope Grade View: Grade I Tube type: Oral Tube size: 7.5 mm Number of attempts: 1 Airway Equipment and Method: Stylet and Video-laryngoscopy Placement Confirmation: ETT inserted through vocal cords under direct vision,  positive ETCO2 and breath sounds checked- equal and bilateral Secured at: 21 cm Tube secured with: Tape Dental Injury: Teeth and Oropharynx as per pre-operative assessment

## 2019-05-19 NOTE — Progress Notes (Signed)
Chaplain visited with family as patient was in the emergency department.  The family showed concern about the mothers health.  The chaplain supported the family in this concern and supported the family in navigating the working of the hospital as the patient was transported to another location.  The chaplain will continue to support this family as we await more information on the patients health.  Brion Aliment Chaplain Resident For questions concerning this note please contact me by pager [phone]

## 2019-05-19 NOTE — ED Notes (Signed)
Patient presents to ed via [place] transfer from [place] Hospital , patient went to bed at 9pm last night got up approx. 1am to go to the bathroom and fell, as found by family and went to [facility] at 7 am. Patient speech is slurred, weak on the left . Patient is alert . Dr,Linzen at bedside.

## 2019-05-19 NOTE — Procedures (Signed)
S/P RT CCA arteriogram followed by complete revascularization of RT MCA M 1 occlusion with x 1 pass with 39mmx 40 mm Solitaire X retriever device achieving a TICI 3 revascularization.Marland Kitchen S.Amye Grego MD

## 2019-05-20 ENCOUNTER — Encounter (HOSPITAL_COMMUNITY): Payer: Self-pay | Admitting: Interventional Radiology

## 2019-05-20 ENCOUNTER — Inpatient Hospital Stay (HOSPITAL_COMMUNITY): Payer: BLUE CROSS/BLUE SHIELD

## 2019-05-20 ENCOUNTER — Ambulatory Visit: Payer: BLUE CROSS/BLUE SHIELD | Admitting: Cardiology

## 2019-05-20 DIAGNOSIS — I639 Cerebral infarction, unspecified: Secondary | ICD-10-CM

## 2019-05-20 DIAGNOSIS — I361 Nonrheumatic tricuspid (valve) insufficiency: Secondary | ICD-10-CM

## 2019-05-20 DIAGNOSIS — J9601 Acute respiratory failure with hypoxia: Secondary | ICD-10-CM

## 2019-05-20 LAB — ECHOCARDIOGRAM COMPLETE: Weight: 3940.06 oz

## 2019-05-20 LAB — CBC WITH DIFFERENTIAL/PLATELET
Abs Immature Granulocytes: 0.03 10*3/uL (ref 0.00–0.07)
Basophils Absolute: 0 10*3/uL (ref 0.0–0.1)
Basophils Relative: 0 %
Eosinophils Absolute: 0 10*3/uL (ref 0.0–0.5)
Eosinophils Relative: 0 %
HCT: 43.6 % (ref 36.0–46.0)
Hemoglobin: 14.6 g/dL (ref 12.0–15.0)
Immature Granulocytes: 0 %
Lymphocytes Relative: 16 %
Lymphs Abs: 1.2 10*3/uL (ref 0.7–4.0)
MCH: 27.9 pg (ref 26.0–34.0)
MCHC: 33.5 g/dL (ref 30.0–36.0)
MCV: 83.4 fL (ref 80.0–100.0)
Monocytes Absolute: 0.8 10*3/uL (ref 0.1–1.0)
Monocytes Relative: 10 %
Neutro Abs: 5.5 10*3/uL (ref 1.7–7.7)
Neutrophils Relative %: 74 %
Platelets: 139 10*3/uL — ABNORMAL LOW (ref 150–400)
RBC: 5.23 MIL/uL — ABNORMAL HIGH (ref 3.87–5.11)
RDW: 16 % — ABNORMAL HIGH (ref 11.5–15.5)
WBC: 7.6 10*3/uL (ref 4.0–10.5)
nRBC: 0 % (ref 0.0–0.2)

## 2019-05-20 LAB — BASIC METABOLIC PANEL
Anion gap: 11 (ref 5–15)
BUN: 13 mg/dL (ref 8–23)
CO2: 19 mmol/L — ABNORMAL LOW (ref 22–32)
Calcium: 8.4 mg/dL — ABNORMAL LOW (ref 8.9–10.3)
Chloride: 110 mmol/L (ref 98–111)
Creatinine, Ser: 0.86 mg/dL (ref 0.44–1.00)
GFR calc Af Amer: >60 mL/min (ref >60)
GFR calc non Af Amer: >60 mL/min (ref >60)
Glucose, Bld: 102 mg/dL — ABNORMAL HIGH (ref 70–99)
Potassium: 3.8 mmol/L (ref 3.5–5.1)
Sodium: 140 mmol/L (ref 135–145)

## 2019-05-20 LAB — LIPID PANEL
Cholesterol: 96 mg/dL (ref 0–200)
HDL: 15 mg/dL — ABNORMAL LOW (ref >40)
LDL Cholesterol: 63 mg/dL (ref 0–99)
Total CHOL/HDL Ratio: 6.4 RATIO
Triglycerides: 90 mg/dL (ref <150)
VLDL: 18 mg/dL (ref 0–40)

## 2019-05-20 LAB — GLUCOSE, CAPILLARY
Glucose-Capillary: 78 mg/dL (ref 70–99)
Glucose-Capillary: 78 mg/dL (ref 70–99)
Glucose-Capillary: 81 mg/dL (ref 70–99)
Glucose-Capillary: 117 mg/dL — ABNORMAL HIGH (ref 70–99)
Glucose-Capillary: 111 mg/dL — ABNORMAL HIGH (ref 70–99)
Glucose-Capillary: 115 mg/dL — ABNORMAL HIGH (ref 70–99)

## 2019-05-20 LAB — HEMOGLOBIN A1C
Hgb A1c MFr Bld: 7.2 % — ABNORMAL HIGH (ref 4.8–5.6)
Hgb A1c MFr Bld: 7.2 % — ABNORMAL HIGH (ref 4.8–5.6)
Mean Plasma Glucose: 159.94 mg/dL
Mean Plasma Glucose: 159.94 mg/dL

## 2019-05-20 IMAGING — DX DG ABD PORTABLE 1V
1 series · 1 of 1 positions shown · non-contrast
Comparison: [DATE]

CLINICAL DATA: Evaluate OG tube

EXAM:
PORTABLE ABDOMEN - 1 VIEW

[abdomen]
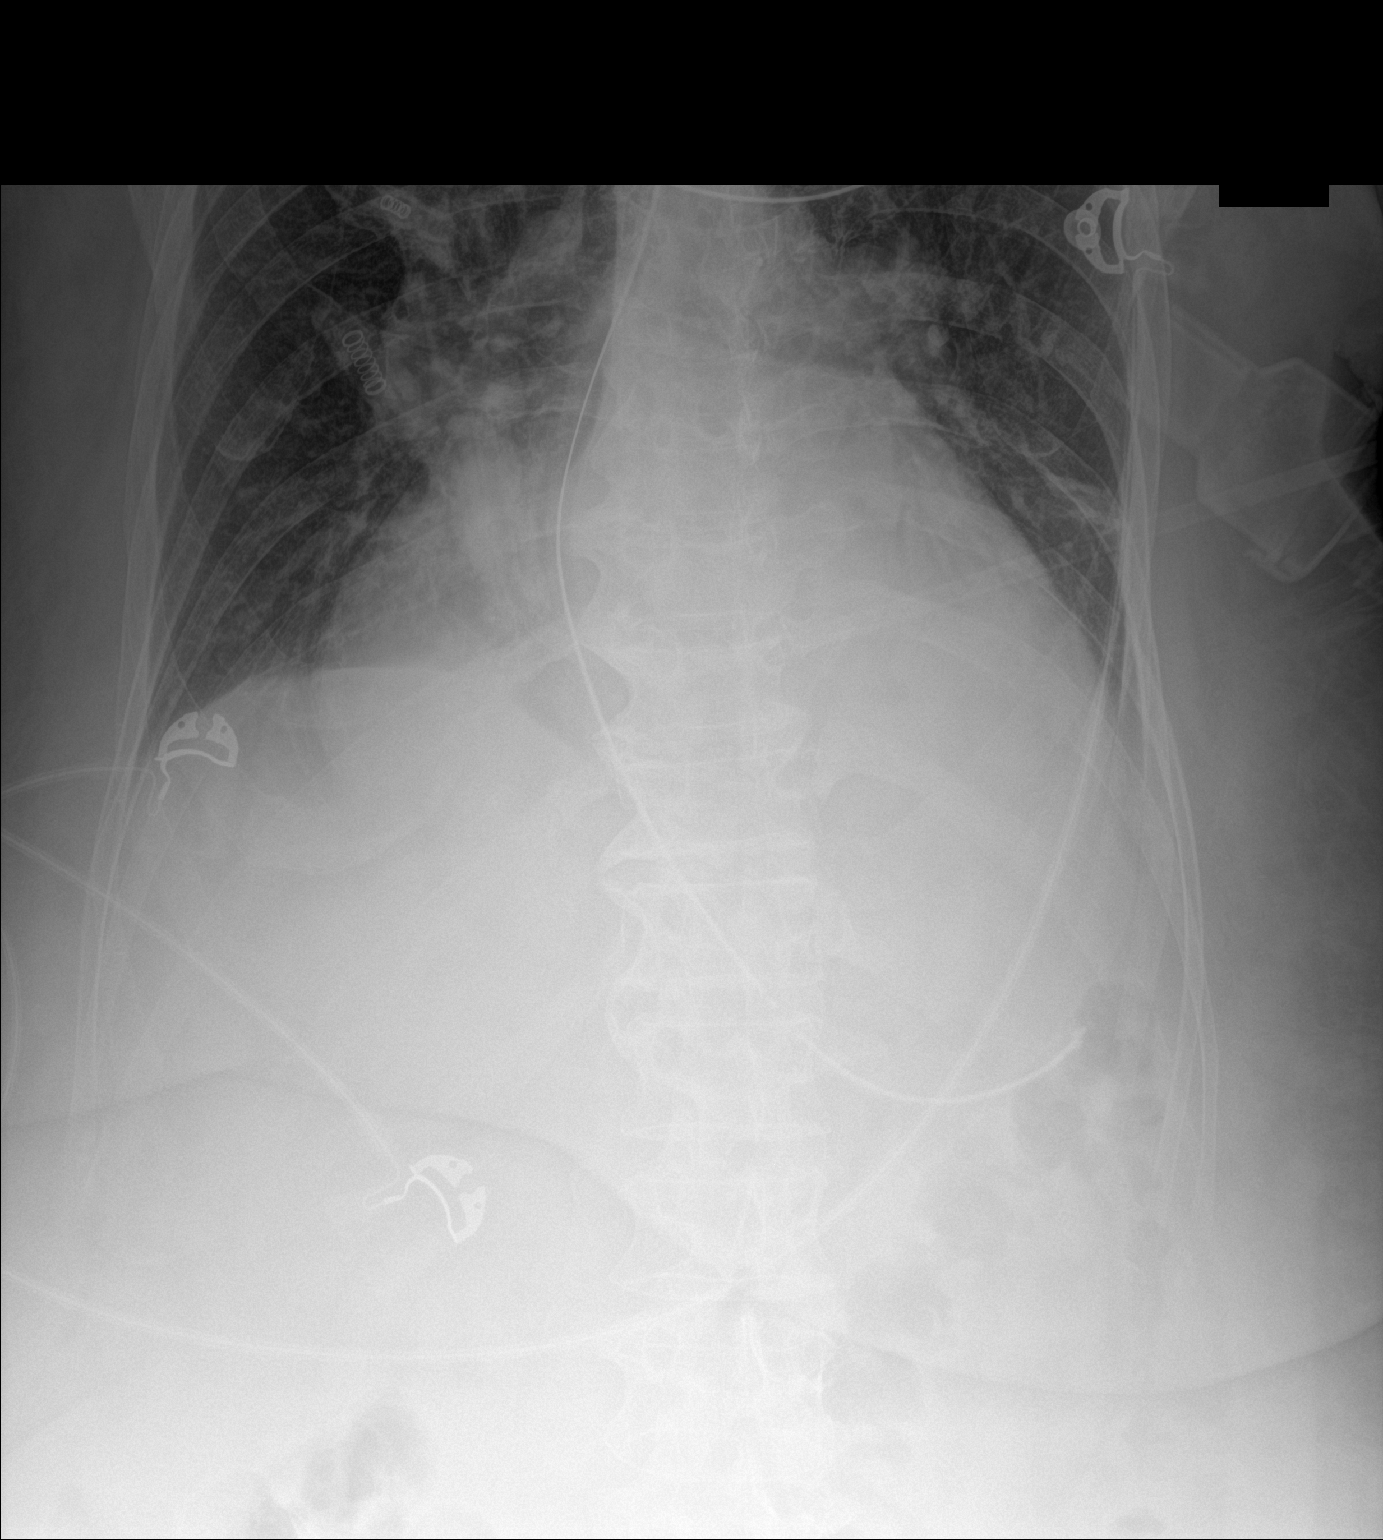

[1 of 1 positions shown; findings below may reference images not displayed]

FINDINGS: The ETT is in good position. The OG tube terminates in the stomach.
The ETT is in good position.
IMPRESSION: The OG tube has been advanced with the side port and distal tip both
in the stomach.

## 2019-05-20 IMAGING — DX DG ABD PORTABLE 1V
2 series · 3 of 3 positions shown · non-contrast
Comparison: [DATE]

CLINICAL DATA: Orogastric tube placement

EXAM:
PORTABLE ABDOMEN - 1 VIEW

[abdomen (1 of 2)]
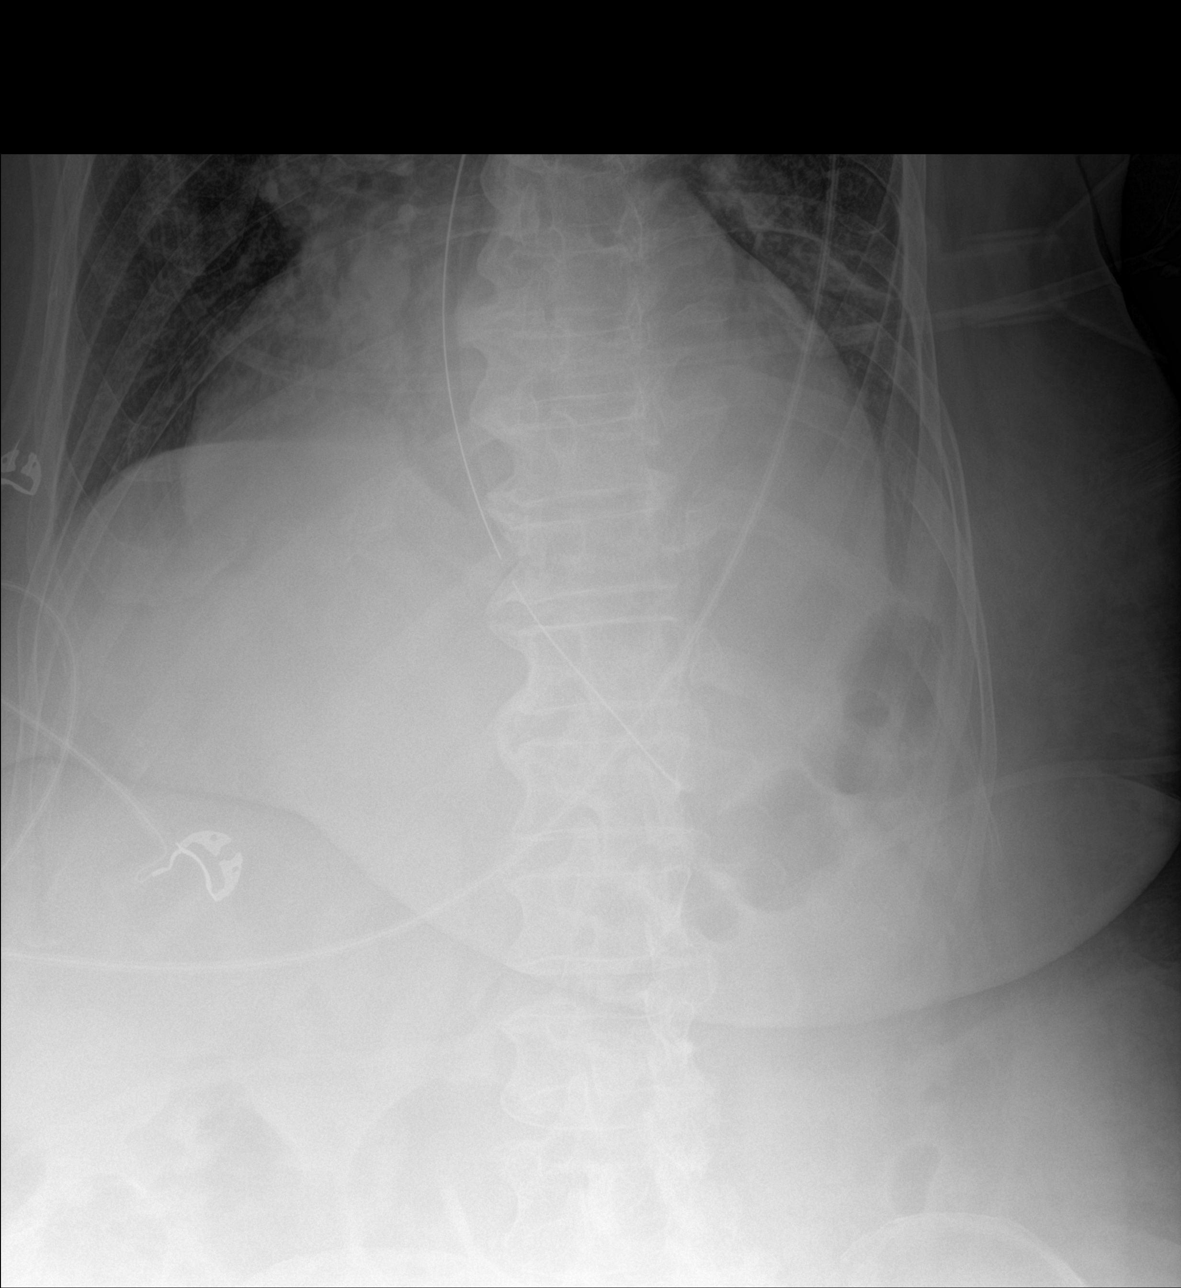

[Series 1: abdomen · 0.14mm/px · 2 of 2 slices shown (2 of 2)]
[im 1/2]
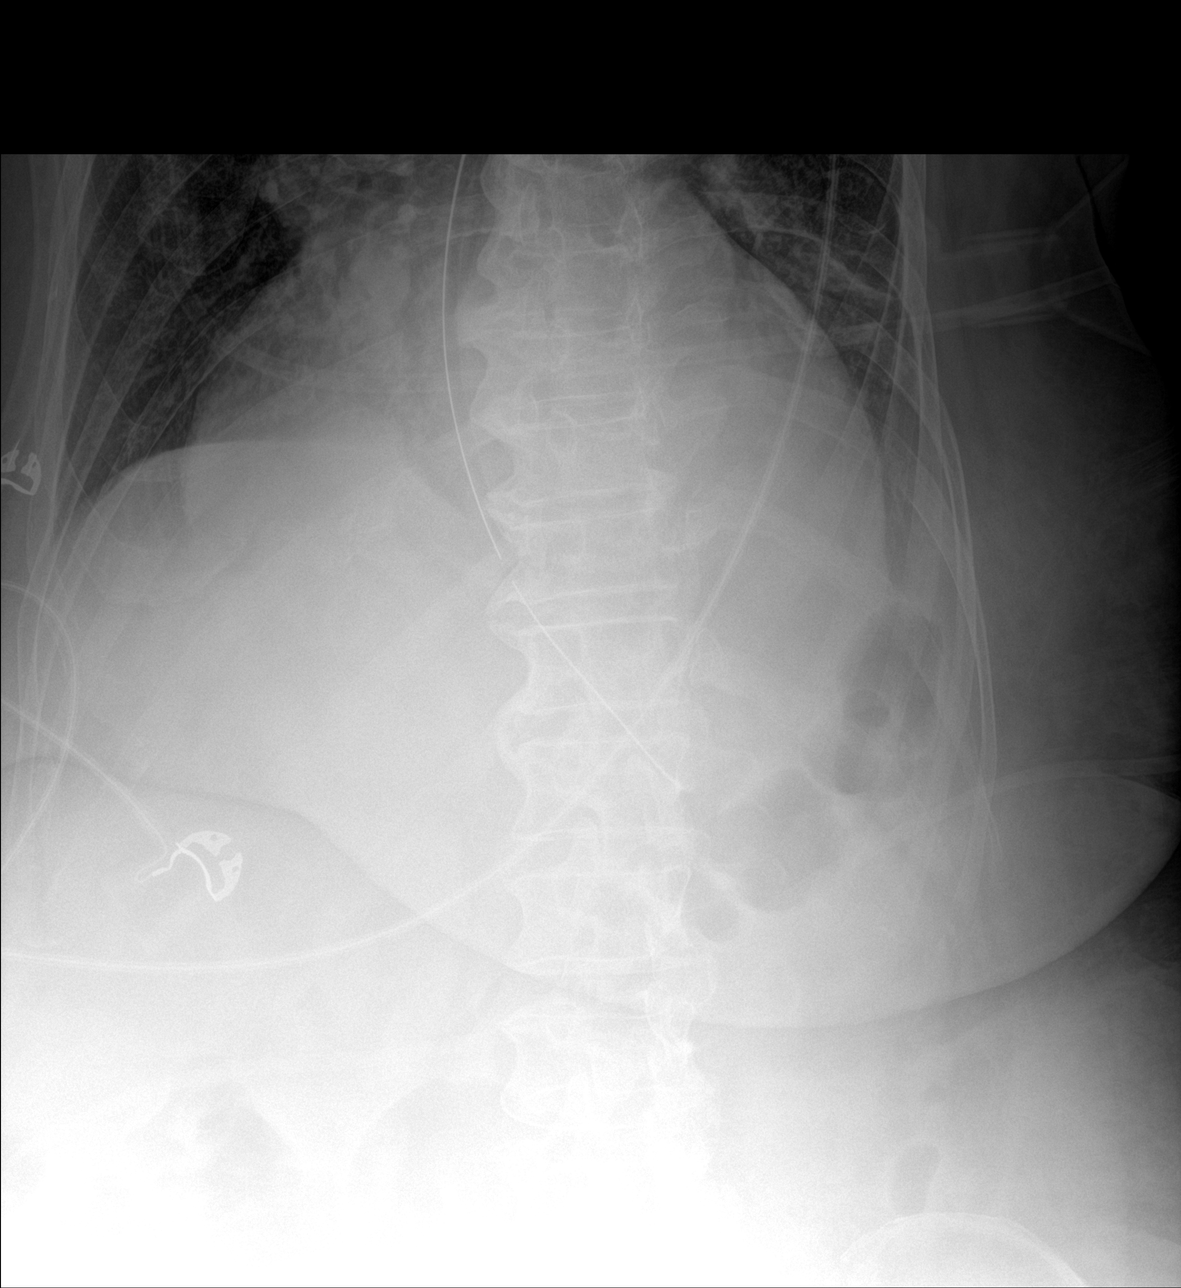
[im 2/2]
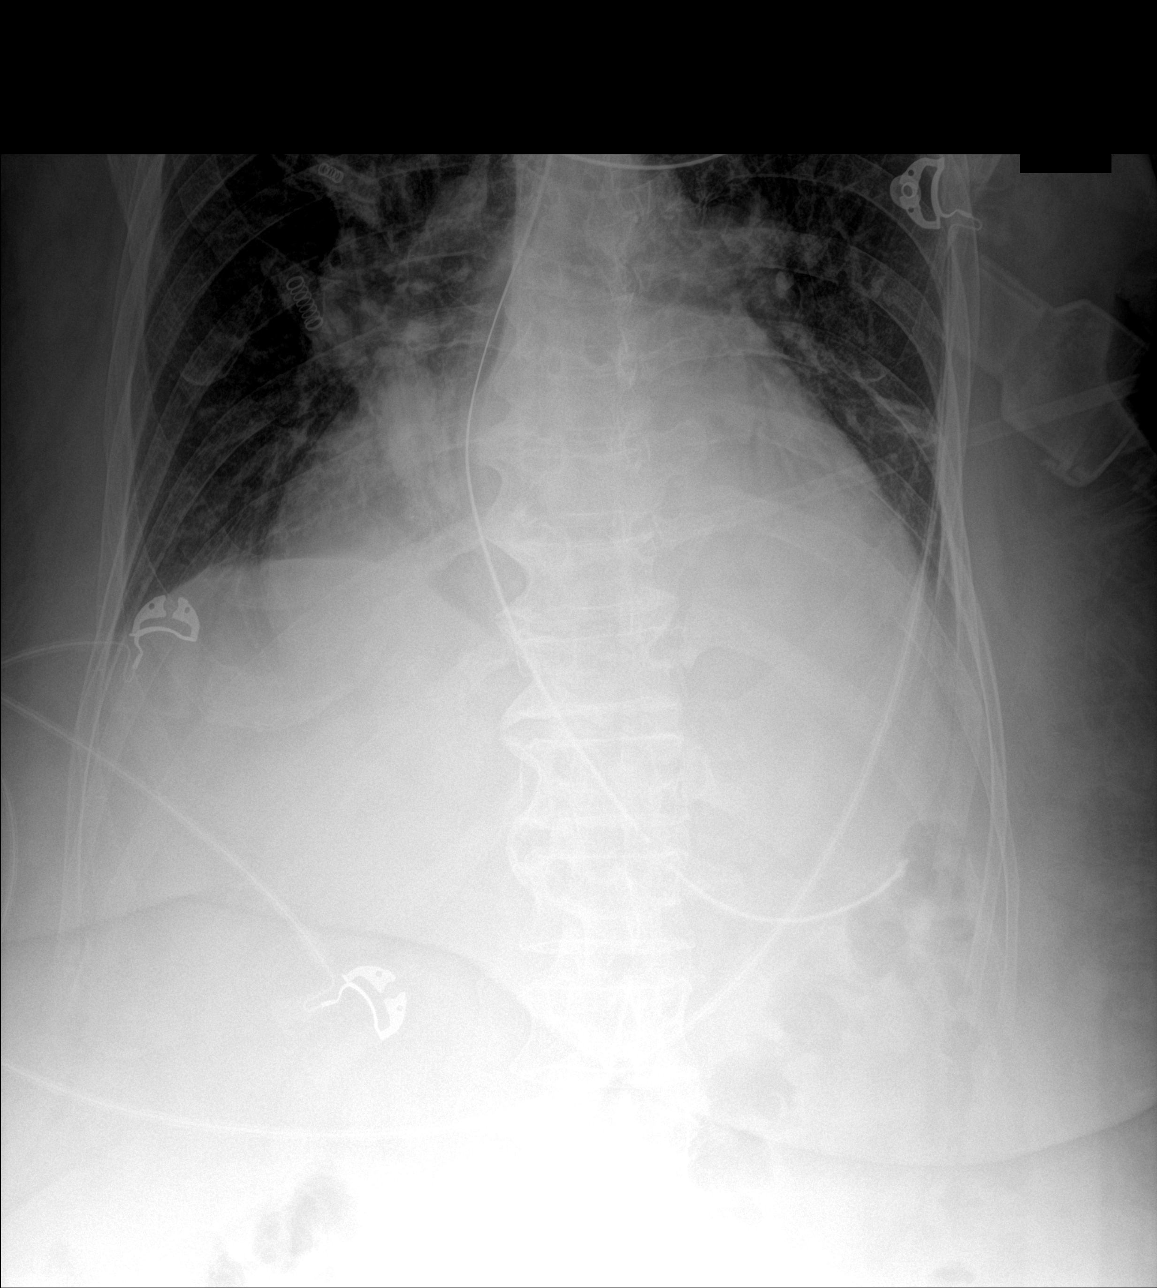

[3 of 3 positions shown; findings below may reference images not displayed]

FINDINGS: The OG tube projects over the region of the gastric body, but has
been retracted slightly since the prior study. The visualized bowel
gas pattern is nonspecific and nonobstructive. The heart size is
enlarged.
IMPRESSION: The oral gastric tube has been retracted slightly. The tip projects
over the region of the gastric body. The side hole projects over the
GE junction.

## 2019-05-20 IMAGING — MR MR HEAD W/O CM
13 of 15 series · 36 of 48 positions shown · IV contrast (gadavist)
Comparison: CT studies yesterday.

CLINICAL DATA: Right MCA stroke due to right M1 occlusion. Status
post intervention.

EXAM:
MRI HEAD WITHOUT  CONTRAST
MRA HEAD WITHOUT CONTRAST
MRA NECK WITHOUT AND WITH CONTRAST
TECHNIQUE: Multiplanar, multiecho pulse sequences of the brain and surrounding
structures were obtained without and with intravenous contrast.
Angiographic images of the Circle of Willis were obtained using MRA
technique without intravenous contrast. Angiographic images of the
neck were obtained using MRA technique without and with intravenous
contrast. Carotid stenosis measurements (when applicable) are
obtained utilizing NASCET criteria, using the distal internal
carotid diameter as the denominator.
CONTRAST:  10mL GADAVIST GADOBUTROL 1 MMOL/ML IV SOLN

[Series 5: DWI · axial · 3.0mm · 0.88mm/px · z∈[-128,+22]mm · 5 of 104 slices shown (1 of 4)]
[im 1/104]
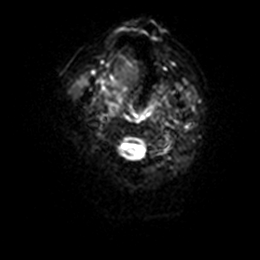
[im 26/104]
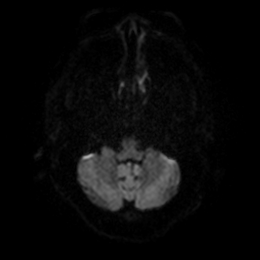
[im 52/104]
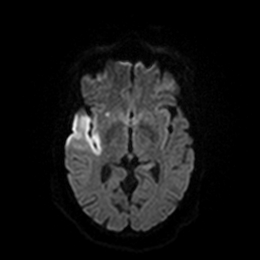
[im 78/104]
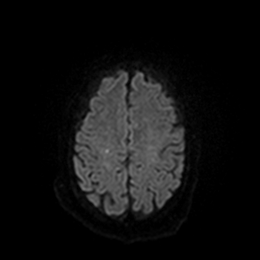
[im 104/104]
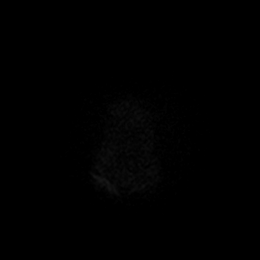

[Series 6: DWI · axial · 3.0mm · 0.88mm/px · z∈[-128,+22]mm · 3 of 52 slices shown (2 of 4)]
[im 1/52]
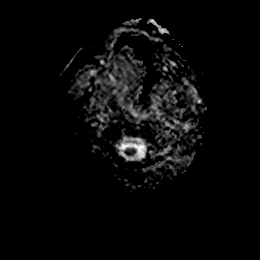
[im 26/52]
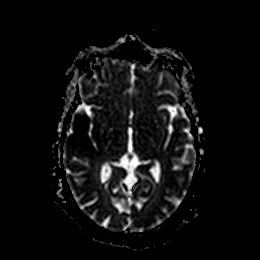
[im 52/52]
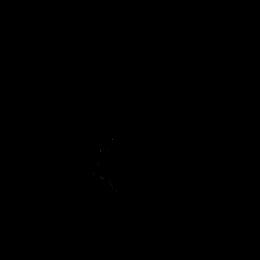

[Series 11: DWI · coronal · 4.0mm · 0.88mm/px · 3 of 66 slices shown (3 of 4)]
[im 1/66]
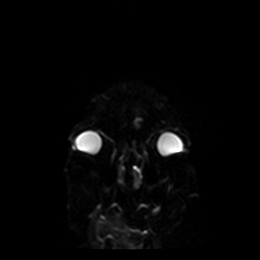
[im 33/66]
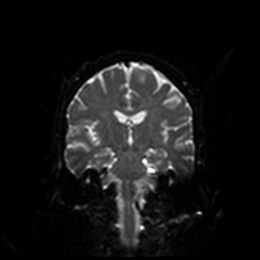
[im 66/66]
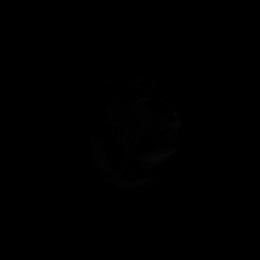

[Series 12: DWI · coronal · 4.0mm · 0.88mm/px · 2 of 33 slices shown (4 of 4)]
[im 1/33]
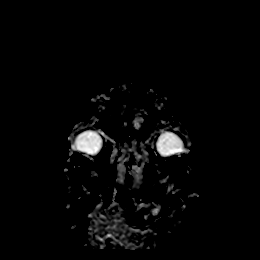
[im 33/33]
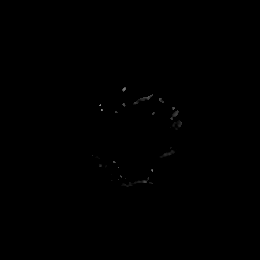

[Series 13: FLAIR · axial · 5.0mm · 0.45mm/px · 1 of 25 slices shown]
[im 1/25]
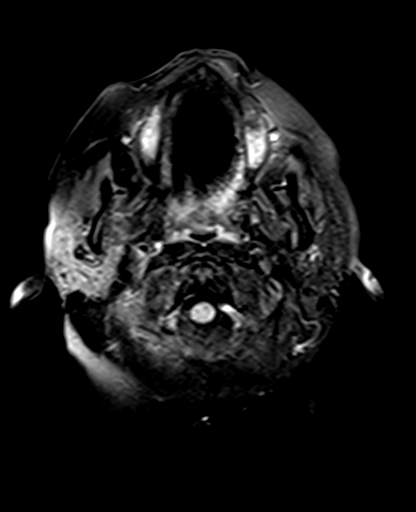

[Series 14: mag_images · axial · 3.0mm · 0.90mm/px · z∈[-140,+33]mm · 3 of 60 slices shown]
[im 1/60]
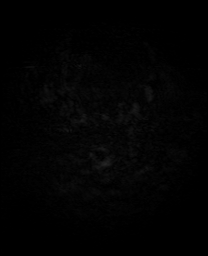
[im 30/60]
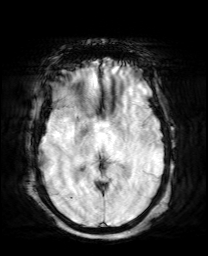
[im 60/60]
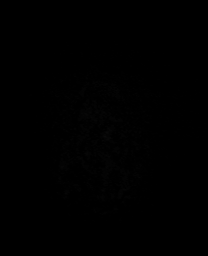

[Series 15: pha_images · axial · 3.0mm · 0.90mm/px · z∈[-137,+30]mm · 3 of 58 slices shown]
[im 1/58]
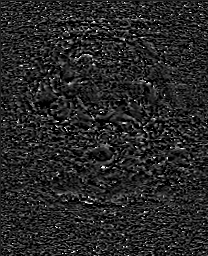
[im 29/58]
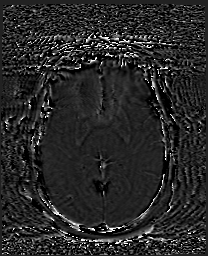
[im 58/58]
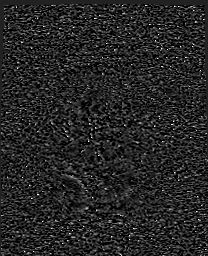

[Series 16: swi_images · axial · 3.0mm · 0.90mm/px · z∈[-140,+33]mm · 3 of 60 slices shown]
[im 1/60]
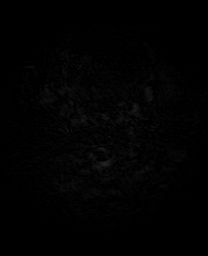
[im 30/60]
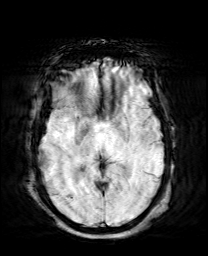
[im 60/60]
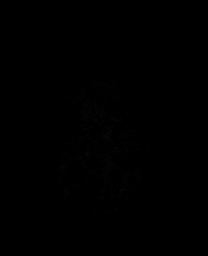

[Series 17: mip_images(sw) · axial · 24.0mm · 0.90mm/px · z∈[-130,+23]mm · 3 of 53 slices shown]
[im 1/53]
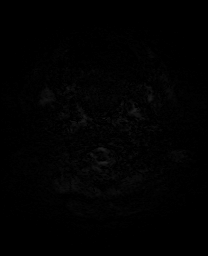
[im 27/53]
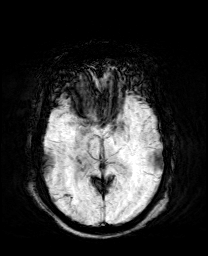
[im 53/53]
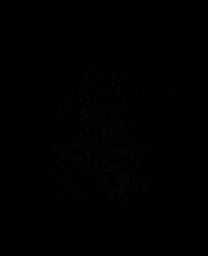

[Series 19: T2 · axial · 5.0mm · 0.72mm/px · 1 of 25 slices shown (1 of 2)]
[im 1/25]
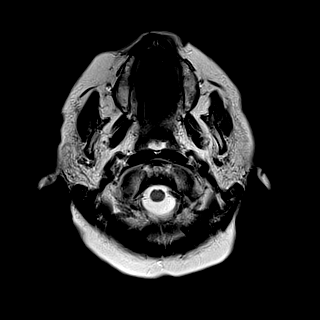

[Series 20: T1 · sagittal · 5.0mm · 0.75mm/px · 1 of 23 slices shown]
[im 1/23]
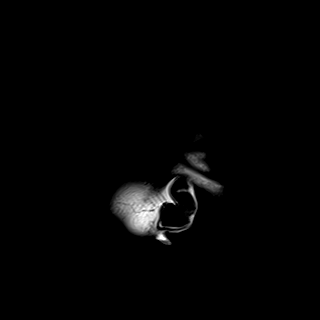

[Series 22: T2 · coronal · 5.0mm · 0.34mm/px · 1 of 29 slices shown (2 of 2)]
[im 1/29]
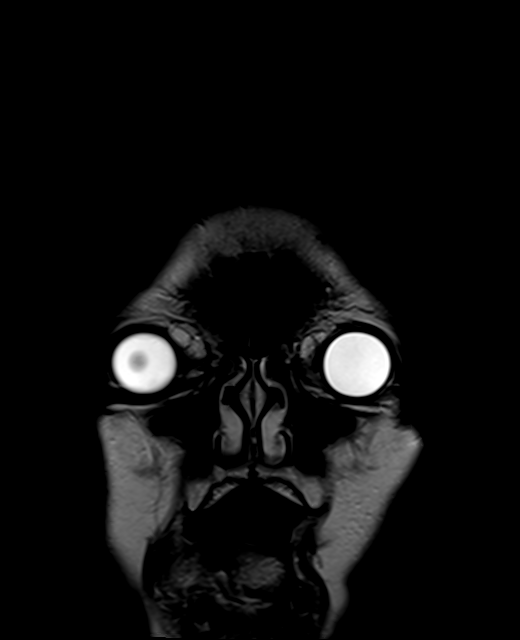

[Series 28: tof_fl3d_tra_iso · axial · 0.6mm · 0.52mm/px · z∈[-222,-149]mm · 7 of 133 slices shown]
[im 1/133]
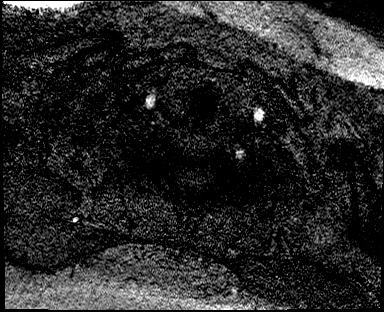
[im 23/133]
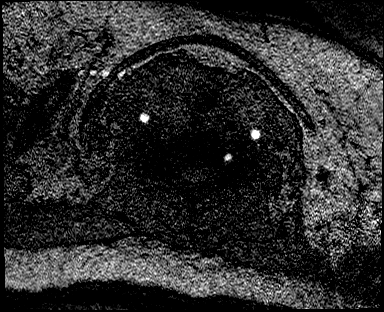
[im 45/133]
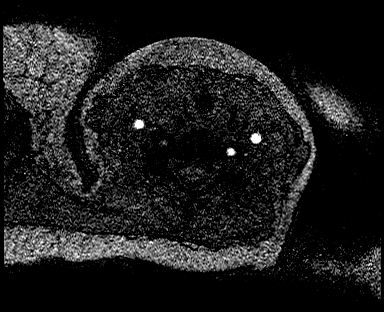
[im 67/133]
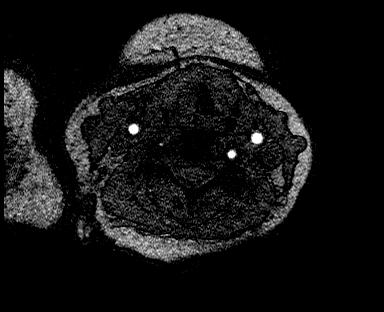
[im 89/133]
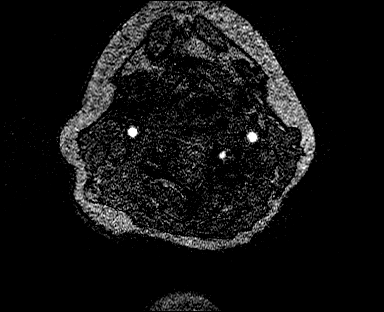
[im 111/133]
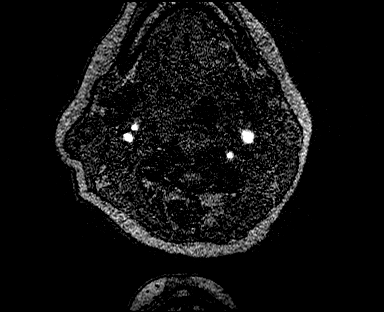
[im 133/133]
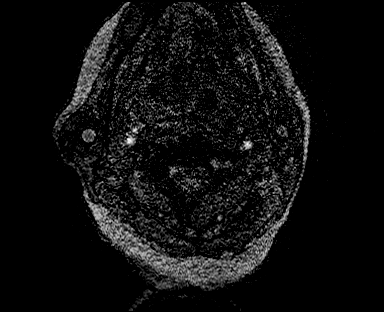

[36 of 48 positions shown; findings below may reference images not displayed]

FINDINGS: MRI HEAD FINDINGS

Brain: Diffusion imaging shows a region of infarction measuring
about 3.5 mm affecting the insular region and temporal lobe on the
right. Mild swelling but no mass effect or hemorrhage. Few other
scattered punctate acute infarctions in the right basal ganglia,
right frontoparietal white matter and along the surfaces of the
right frontoparietal brain. No mass effect or shift.

There is old infarction in the right occipital lobe with atrophy and
gliosis. No hydrocephalus. No extra-axial collection. No sign of
blood products.

Vascular: Major vessels at the base of the brain show flow.

Skull and upper cervical spine: Negative

Sinuses/Orbits: Clear/normal

Other: None

MRA HEAD FINDINGS

Both internal carotid arteries are patent through the skull base and
siphon regions. The anterior and middle cerebral vessels show flow
presently. Restoration of flow in the right MCA territory. Right A1
segment is diminutive.

Right vertebral artery terminates in PICA. Left vertebral artery
supplies the basilar. No basilar stenosis or occlusion. Posterior
circulation branch vessels show flow.

MRA NECK FINDINGS

Both common carotid arteries are widely patent to the bifurcation.
Both carotid bifurcations are widely patent without stenosis at the
origin or in either ICA bulb. Some atherosclerotic plaque in both
bulb regions but no stenosis using NASCET criteria.

Dominant left vertebral artery widely patent at the origin and
through the cervical region to the basilar. Very diminutive right
vertebral artery, either due to congenital small size or acquired
disease.
IMPRESSION: 3.5 cm region of acute infarction in the right insula and temporal
lobe. Few other scattered punctate infarctions in the right MCA
territory. No mass effect or hemorrhage.

Restoration of flow in the right MCA territory. No new intracranial
occlusion.

Atherosclerotic plaque at both carotid bulb regions but without
stenosis.

Tiny/thready right vertebral artery which could be congenital or due
to acquired right vertebral disease. The left vertebral is large
vessel that appears widely patent and normal.

## 2019-05-20 IMAGING — MR MR MRA HEAD W/O CM
16 of 18 series · 39 of 48 positions shown · IV contrast (gadavist)
Comparison: CT studies yesterday.

CLINICAL DATA: Right MCA stroke due to right M1 occlusion. Status
post intervention.

EXAM:
MRI HEAD WITHOUT  CONTRAST
MRA HEAD WITHOUT CONTRAST
MRA NECK WITHOUT AND WITH CONTRAST
TECHNIQUE: Multiplanar, multiecho pulse sequences of the brain and surrounding
structures were obtained without and with intravenous contrast.
Angiographic images of the Circle of Willis were obtained using MRA
technique without intravenous contrast. Angiographic images of the
neck were obtained using MRA technique without and with intravenous
contrast. Carotid stenosis measurements (when applicable) are
obtained utilizing NASCET criteria, using the distal internal
carotid diameter as the denominator.
CONTRAST:  10mL GADAVIST GADOBUTROL 1 MMOL/ML IV SOLN

[Series 5: DWI · axial · 3.0mm · 0.88mm/px · z∈[-128,+22]mm · 4 of 104 slices shown (1 of 4)]
[im 1/104]
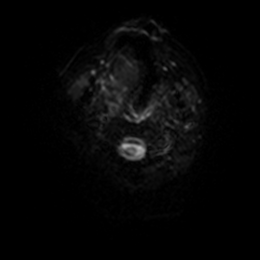
[im 35/104]
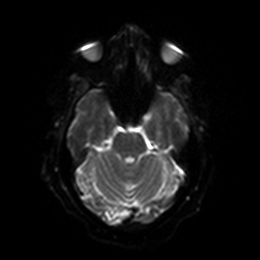
[im 69/104]
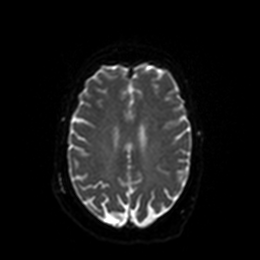
[im 104/104]
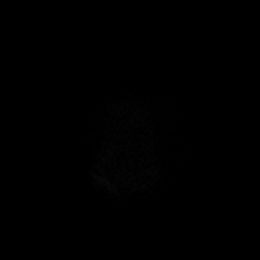

[Series 6: DWI · axial · 3.0mm · 0.88mm/px · z∈[-128,+22]mm · 2 of 52 slices shown (2 of 4)]
[im 1/52]
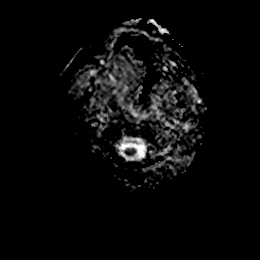
[im 52/52]
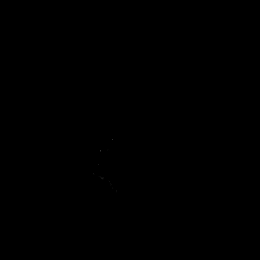

[Series 11: DWI · coronal · 4.0mm · 0.88mm/px · 3 of 66 slices shown (3 of 4)]
[im 1/66]
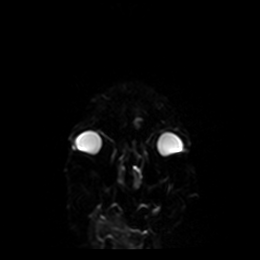
[im 33/66]
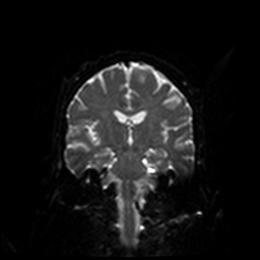
[im 66/66]
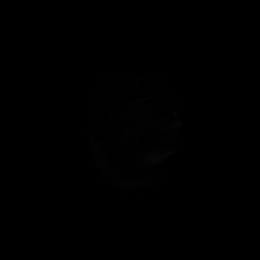

[Series 12: DWI · coronal · 4.0mm · 0.88mm/px · 1 of 33 slices shown (4 of 4)]
[im 1/33]
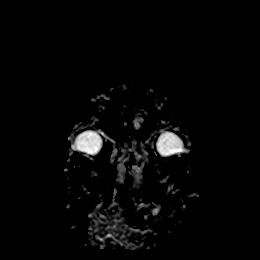

[Series 13: FLAIR · axial · 5.0mm · 0.45mm/px · 1 of 25 slices shown]
[im 1/25]
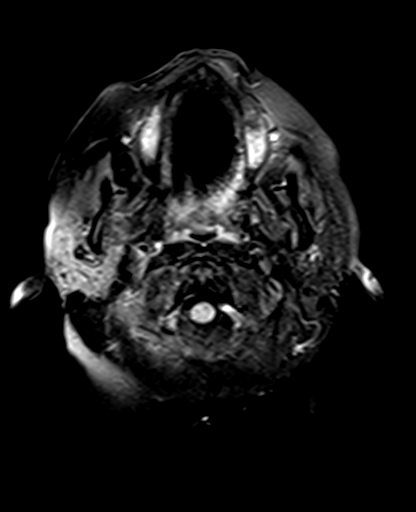

[Series 14: mag_images · axial · 3.0mm · 0.90mm/px · z∈[-140,+33]mm · 2 of 60 slices shown]
[im 1/60]
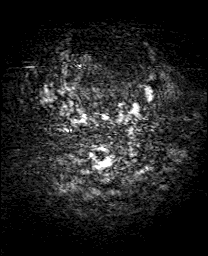
[im 60/60]
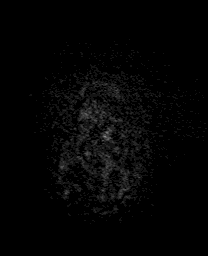

[Series 15: pha_images · axial · 3.0mm · 0.90mm/px · z∈[-137,+30]mm · 2 of 58 slices shown]
[im 1/58]
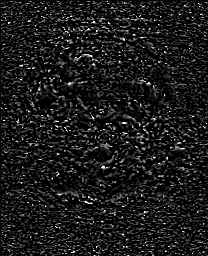
[im 58/58]
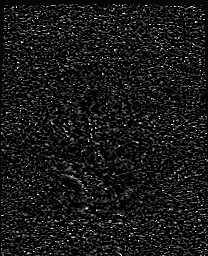

[Series 16: swi_images · axial · 3.0mm · 0.90mm/px · z∈[-140,+33]mm · 2 of 60 slices shown]
[im 1/60]
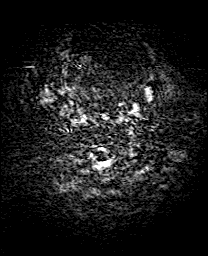
[im 60/60]
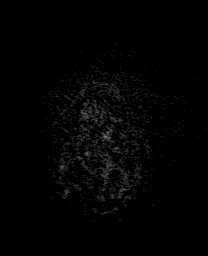

[Series 17: mip_images(sw) · axial · 24.0mm · 0.90mm/px · z∈[-130,+23]mm · 2 of 53 slices shown]
[im 1/53]
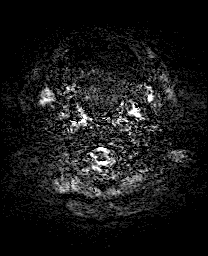
[im 53/53]
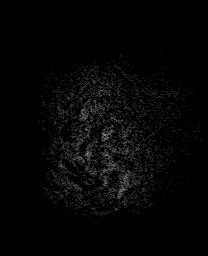

[Series 19: T2 · axial · 5.0mm · 0.72mm/px · 1 of 25 slices shown (1 of 2)]
[im 1/25]
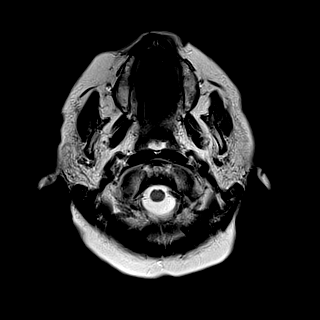

[Series 20: T1 · sagittal · 5.0mm · 0.75mm/px · 1 of 23 slices shown]
[im 1/23]
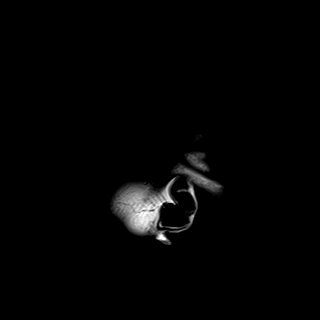

[Series 22: T2 · coronal · 5.0mm · 0.34mm/px · 1 of 29 slices shown (2 of 2)]
[im 1/29]
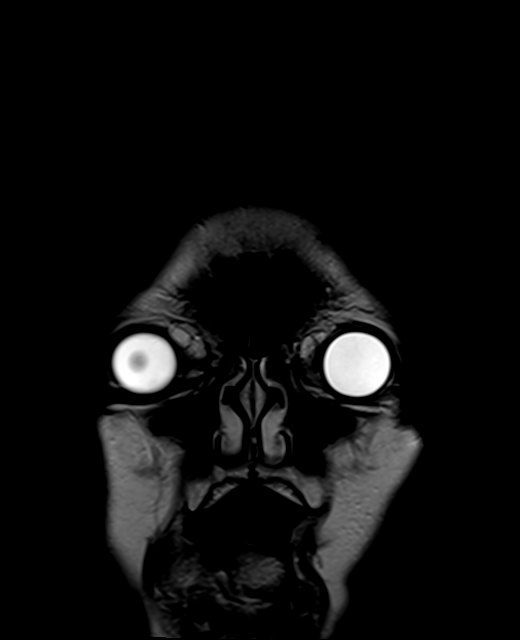

[Series 28: tof_fl3d_tra_iso · axial · 0.6mm · 0.52mm/px · z∈[-222,-149]mm · 5 of 133 slices shown]
[im 1/133]
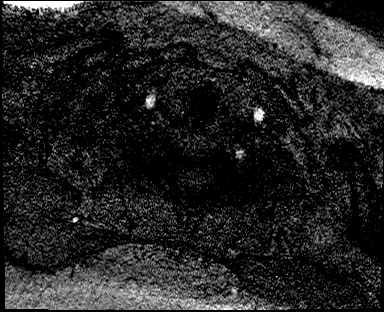
[im 34/133]
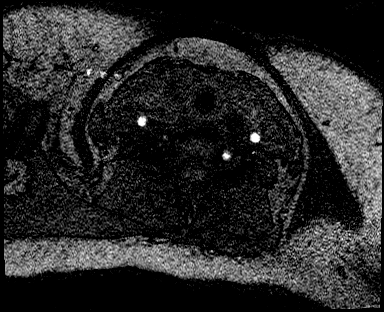
[im 67/133]
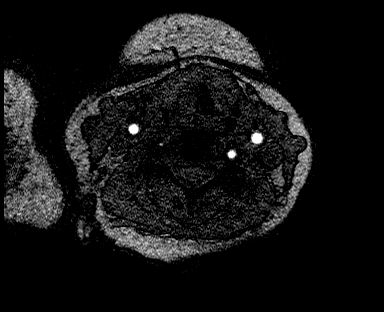
[im 100/133]
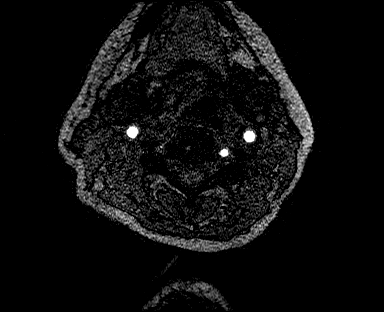
[im 133/133]
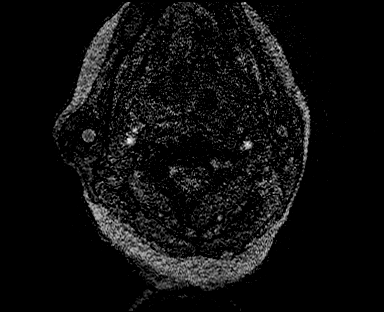

[Series 37: angio_fl3d_cor_post_ttc=3.0s · coronal · 0.9mm · 0.85mm/px · 4 of 96 slices shown]
[im 1/96]
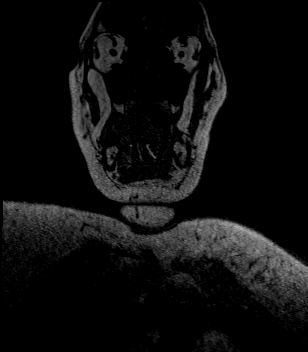
[im 32/96]
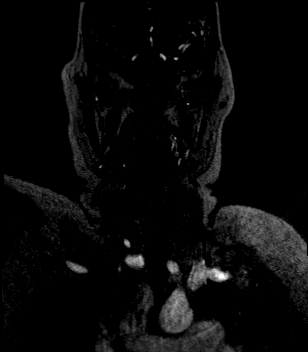
[im 64/96]
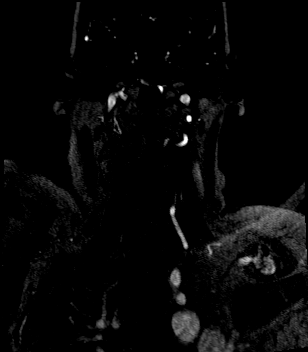
[im 96/96]
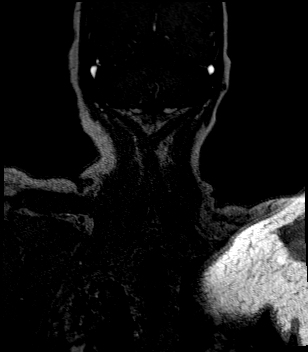

[Series 38: angio_fl3d_cor_post_ttc=3.0s_moco-adv · coronal · 0.9mm · 0.85mm/px · 4 of 96 slices shown]
[im 1/96]
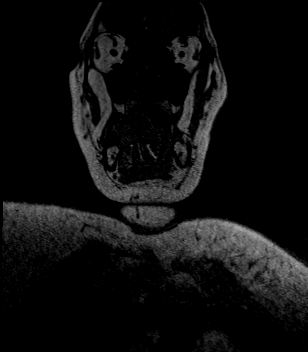
[im 32/96]
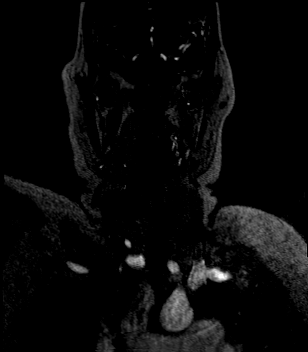
[im 64/96]
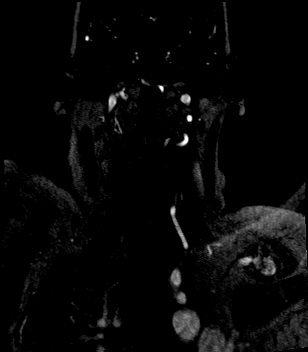
[im 96/96]
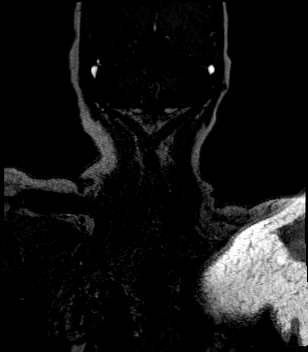

[Series 39: angio_fl3d_cor_post_ttc=3.0s_moco-adv_sub · coronal · 0.9mm · 0.85mm/px · 4 of 96 slices shown]
[im 1/96]
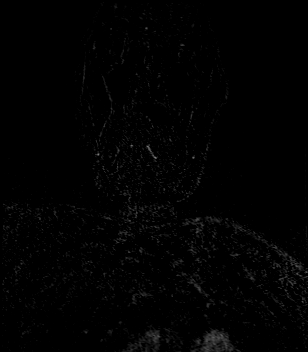
[im 32/96]
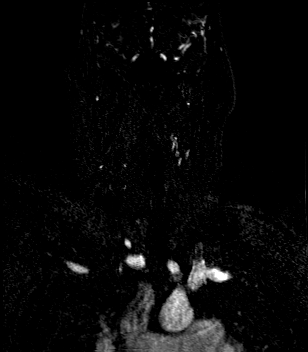
[im 64/96]
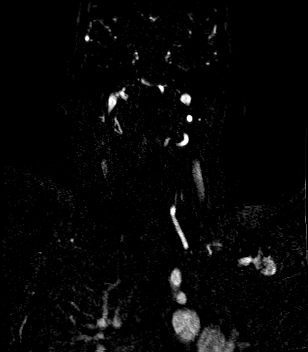
[im 96/96]
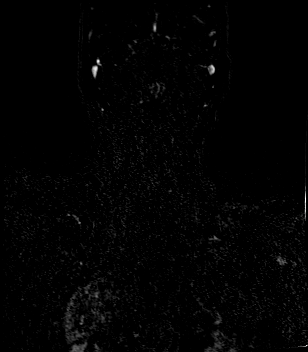

[39 of 48 positions shown; findings below may reference images not displayed]

FINDINGS: MRI HEAD FINDINGS

Brain: Diffusion imaging shows a region of infarction measuring
about 3.5 mm affecting the insular region and temporal lobe on the
right. Mild swelling but no mass effect or hemorrhage. Few other
scattered punctate acute infarctions in the right basal ganglia,
right frontoparietal white matter and along the surfaces of the
right frontoparietal brain. No mass effect or shift.

There is old infarction in the right occipital lobe with atrophy and
gliosis. No hydrocephalus. No extra-axial collection. No sign of
blood products.

Vascular: Major vessels at the base of the brain show flow.

Skull and upper cervical spine: Negative

Sinuses/Orbits: Clear/normal

Other: None

MRA HEAD FINDINGS

Both internal carotid arteries are patent through the skull base and
siphon regions. The anterior and middle cerebral vessels show flow
presently. Restoration of flow in the right MCA territory. Right A1
segment is diminutive.

Right vertebral artery terminates in PICA. Left vertebral artery
supplies the basilar. No basilar stenosis or occlusion. Posterior
circulation branch vessels show flow.

MRA NECK FINDINGS

Both common carotid arteries are widely patent to the bifurcation.
Both carotid bifurcations are widely patent without stenosis at the
origin or in either ICA bulb. Some atherosclerotic plaque in both
bulb regions but no stenosis using NASCET criteria.

Dominant left vertebral artery widely patent at the origin and
through the cervical region to the basilar. Very diminutive right
vertebral artery, either due to congenital small size or acquired
disease.
IMPRESSION: 3.5 cm region of acute infarction in the right insula and temporal
lobe. Few other scattered punctate infarctions in the right MCA
territory. No mass effect or hemorrhage.

Restoration of flow in the right MCA territory. No new intracranial
occlusion.

Atherosclerotic plaque at both carotid bulb regions but without
stenosis.

Tiny/thready right vertebral artery which could be congenital or due
to acquired right vertebral disease. The left vertebral is large
vessel that appears widely patent and normal.

## 2019-05-20 IMAGING — MR MR MRA NECK WO/W CM
16 of 18 series · 39 of 48 positions shown · IV contrast (gadavist)
Comparison: CT studies yesterday.

CLINICAL DATA: Right MCA stroke due to right M1 occlusion. Status
post intervention.

EXAM:
MRI HEAD WITHOUT  CONTRAST
MRA HEAD WITHOUT CONTRAST
MRA NECK WITHOUT AND WITH CONTRAST
TECHNIQUE: Multiplanar, multiecho pulse sequences of the brain and surrounding
structures were obtained without and with intravenous contrast.
Angiographic images of the Circle of Willis were obtained using MRA
technique without intravenous contrast. Angiographic images of the
neck were obtained using MRA technique without and with intravenous
contrast. Carotid stenosis measurements (when applicable) are
obtained utilizing NASCET criteria, using the distal internal
carotid diameter as the denominator.
CONTRAST:  10mL GADAVIST GADOBUTROL 1 MMOL/ML IV SOLN

[Series 5: DWI · axial · 3.0mm · 0.88mm/px · z∈[-128,+22]mm · 4 of 104 slices shown (1 of 4)]
[im 1/104]
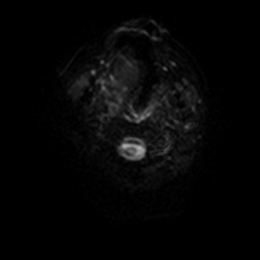
[im 35/104]
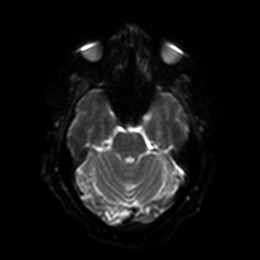
[im 69/104]
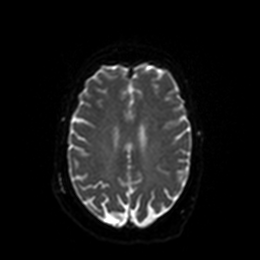
[im 104/104]
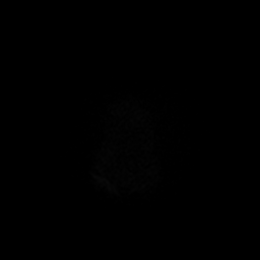

[Series 6: DWI · axial · 3.0mm · 0.88mm/px · z∈[-128,+22]mm · 2 of 52 slices shown (2 of 4)]
[im 1/52]
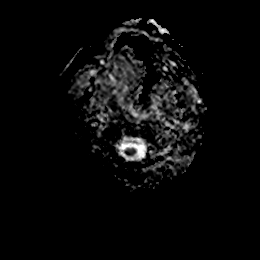
[im 52/52]
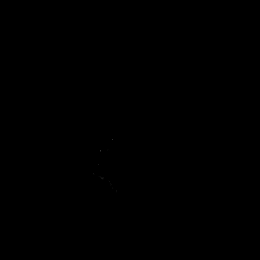

[Series 11: DWI · coronal · 4.0mm · 0.88mm/px · 3 of 66 slices shown (3 of 4)]
[im 1/66]
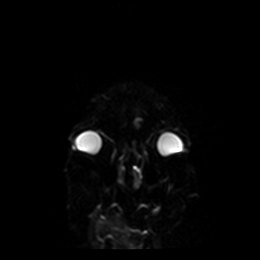
[im 33/66]
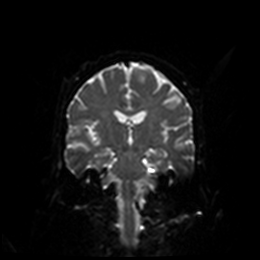
[im 66/66]
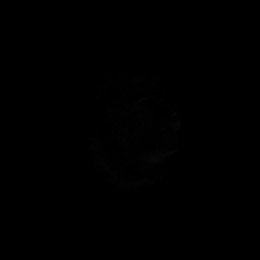

[Series 12: DWI · coronal · 4.0mm · 0.88mm/px · 1 of 33 slices shown (4 of 4)]
[im 1/33]
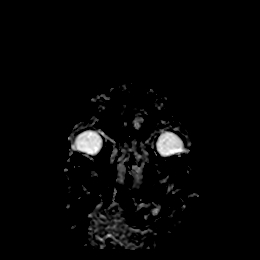

[Series 13: FLAIR · axial · 5.0mm · 0.45mm/px · 1 of 25 slices shown]
[im 1/25]
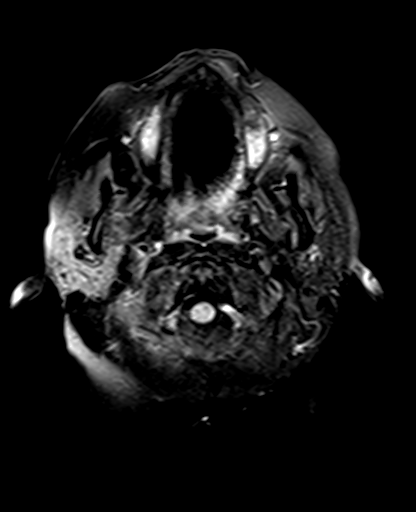

[Series 14: mag_images · axial · 3.0mm · 0.90mm/px · z∈[-140,+33]mm · 2 of 60 slices shown]
[im 1/60]
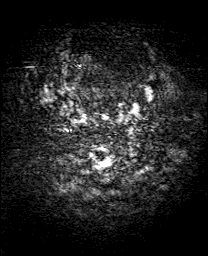
[im 60/60]
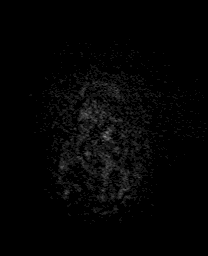

[Series 15: pha_images · axial · 3.0mm · 0.90mm/px · z∈[-137,+30]mm · 2 of 58 slices shown]
[im 1/58]
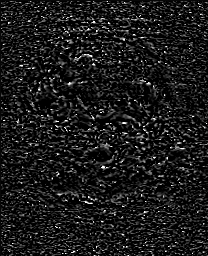
[im 58/58]
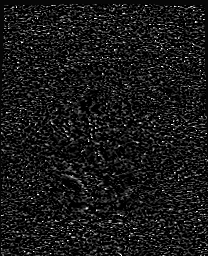

[Series 16: swi_images · axial · 3.0mm · 0.90mm/px · z∈[-140,+33]mm · 2 of 60 slices shown]
[im 1/60]
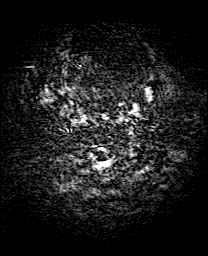
[im 60/60]
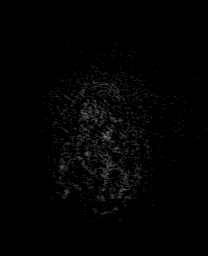

[Series 17: mip_images(sw) · axial · 24.0mm · 0.90mm/px · z∈[-130,+23]mm · 2 of 53 slices shown]
[im 1/53]
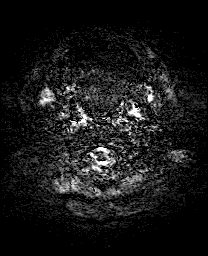
[im 53/53]
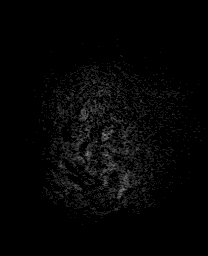

[Series 19: T2 · axial · 5.0mm · 0.72mm/px · 1 of 25 slices shown (1 of 2)]
[im 1/25]
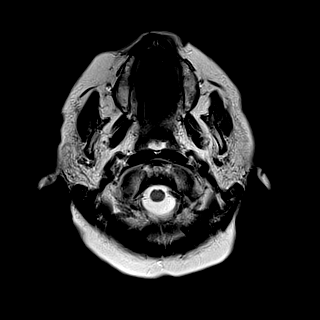

[Series 20: T1 · sagittal · 5.0mm · 0.75mm/px · 1 of 23 slices shown]
[im 1/23]
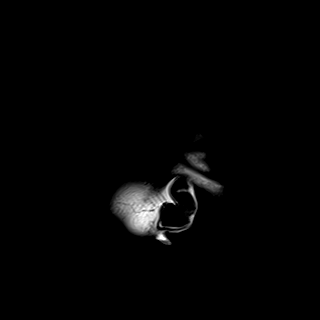

[Series 22: T2 · coronal · 5.0mm · 0.34mm/px · 1 of 29 slices shown (2 of 2)]
[im 1/29]
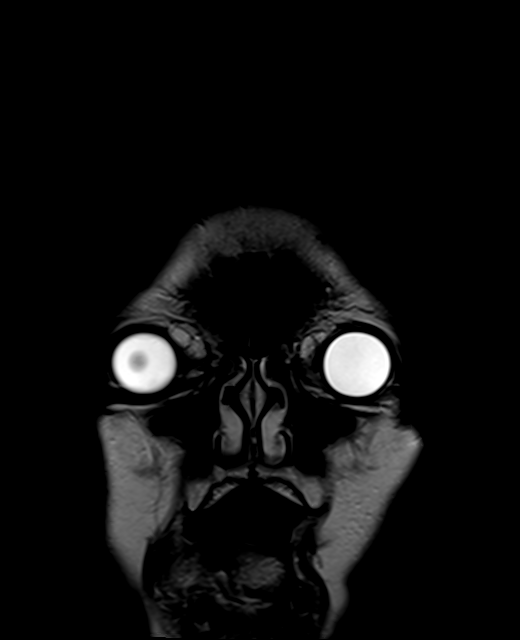

[Series 28: tof_fl3d_tra_iso · axial · 0.6mm · 0.52mm/px · z∈[-222,-149]mm · 5 of 133 slices shown]
[im 1/133]
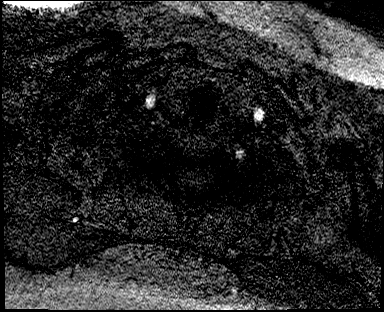
[im 34/133]
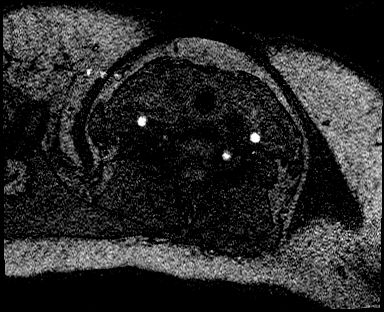
[im 67/133]
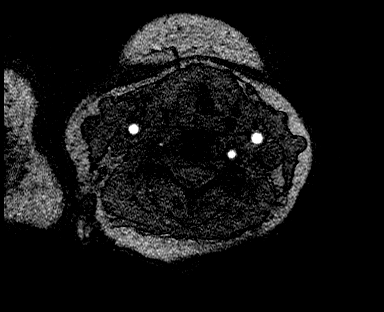
[im 100/133]
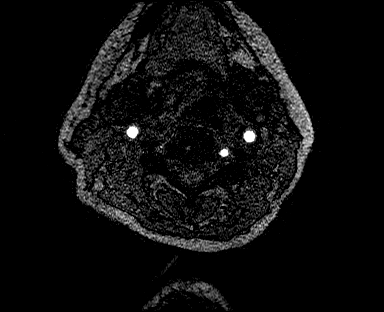
[im 133/133]
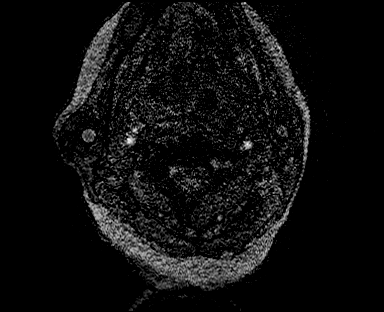

[Series 37: angio_fl3d_cor_post_ttc=3.0s · coronal · 0.9mm · 0.85mm/px · 4 of 96 slices shown]
[im 1/96]
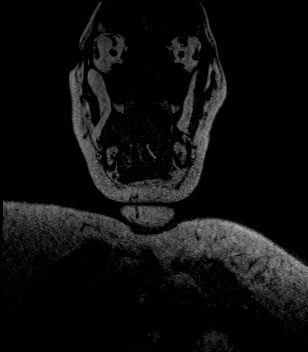
[im 32/96]
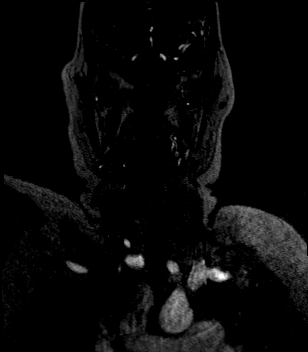
[im 64/96]
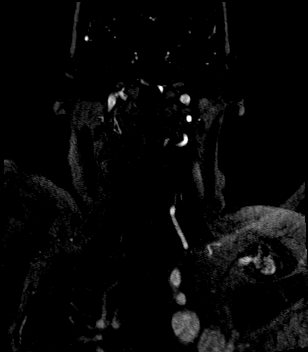
[im 96/96]
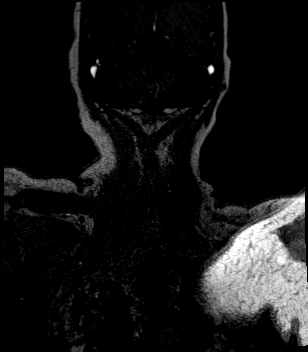

[Series 38: angio_fl3d_cor_post_ttc=3.0s_moco-adv · coronal · 0.9mm · 0.85mm/px · 4 of 96 slices shown]
[im 1/96]
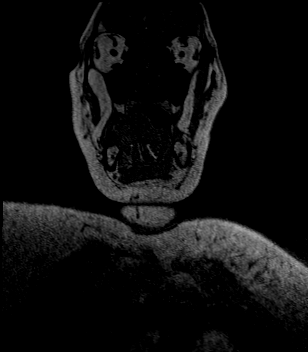
[im 32/96]
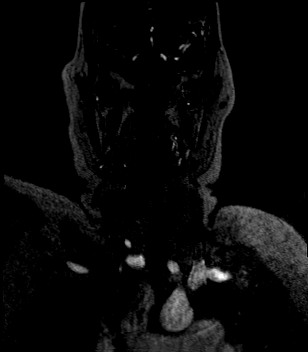
[im 64/96]
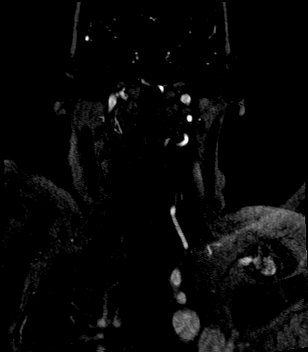
[im 96/96]
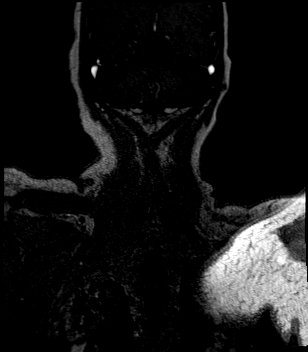

[Series 39: angio_fl3d_cor_post_ttc=3.0s_moco-adv_sub · coronal · 0.9mm · 0.85mm/px · 4 of 96 slices shown]
[im 1/96]
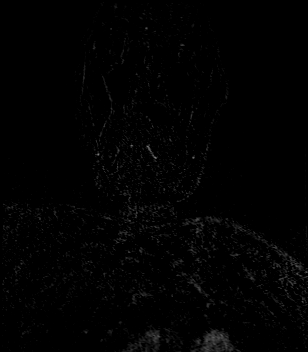
[im 32/96]
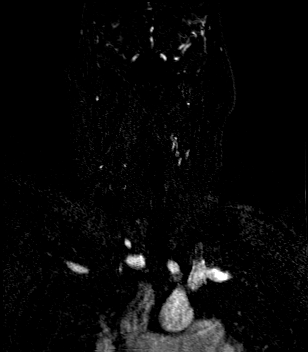
[im 64/96]
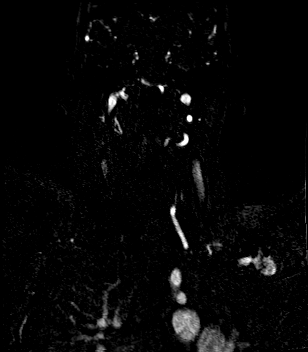
[im 96/96]
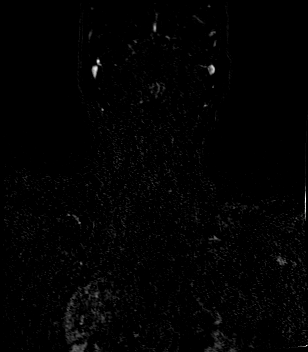

[39 of 48 positions shown; findings below may reference images not displayed]

FINDINGS: MRI HEAD FINDINGS

Brain: Diffusion imaging shows a region of infarction measuring
about 3.5 mm affecting the insular region and temporal lobe on the
right. Mild swelling but no mass effect or hemorrhage. Few other
scattered punctate acute infarctions in the right basal ganglia,
right frontoparietal white matter and along the surfaces of the
right frontoparietal brain. No mass effect or shift.

There is old infarction in the right occipital lobe with atrophy and
gliosis. No hydrocephalus. No extra-axial collection. No sign of
blood products.

Vascular: Major vessels at the base of the brain show flow.

Skull and upper cervical spine: Negative

Sinuses/Orbits: Clear/normal

Other: None

MRA HEAD FINDINGS

Both internal carotid arteries are patent through the skull base and
siphon regions. The anterior and middle cerebral vessels show flow
presently. Restoration of flow in the right MCA territory. Right A1
segment is diminutive.

Right vertebral artery terminates in PICA. Left vertebral artery
supplies the basilar. No basilar stenosis or occlusion. Posterior
circulation branch vessels show flow.

MRA NECK FINDINGS

Both common carotid arteries are widely patent to the bifurcation.
Both carotid bifurcations are widely patent without stenosis at the
origin or in either ICA bulb. Some atherosclerotic plaque in both
bulb regions but no stenosis using NASCET criteria.

Dominant left vertebral artery widely patent at the origin and
through the cervical region to the basilar. Very diminutive right
vertebral artery, either due to congenital small size or acquired
disease.
IMPRESSION: 3.5 cm region of acute infarction in the right insula and temporal
lobe. Few other scattered punctate infarctions in the right MCA
territory. No mass effect or hemorrhage.

Restoration of flow in the right MCA territory. No new intracranial
occlusion.

Atherosclerotic plaque at both carotid bulb regions but without
stenosis.

Tiny/thready right vertebral artery which could be congenital or due
to acquired right vertebral disease. The left vertebral is large
vessel that appears widely patent and normal.

## 2019-05-20 MED ORDER — ASPIRIN 300 MG RE SUPP
300.0000 mg | Freq: Every day | RECTAL | Status: DC
  Administered 2019-05-20: 300 mg via RECTAL
  Filled 2019-05-20: qty 1

## 2019-05-20 MED ORDER — PERFLUTREN LIPID MICROSPHERE
1.0000 mL | INTRAVENOUS | Status: DC | PRN
  Administered 2019-05-20: 3 mL via INTRAVENOUS
  Filled 2019-05-20: qty 10

## 2019-05-20 MED ORDER — ENOXAPARIN SODIUM 60 MG/0.6ML ~~LOC~~ SOLN
60.0000 mg | SUBCUTANEOUS | Status: DC
  Administered 2019-05-20 – 2019-05-23 (×4): 60 mg via SUBCUTANEOUS
  Filled 2019-05-20 (×4): qty 0.6

## 2019-05-20 MED ORDER — PANTOPRAZOLE SODIUM 40 MG PO PACK
40.0000 mg | PACK | Freq: Every day | ORAL | Status: DC
  Administered 2019-05-20 – 2019-05-28 (×9): 40 mg
  Filled 2019-05-20 (×9): qty 20

## 2019-05-20 MED ORDER — ENOXAPARIN SODIUM 40 MG/0.4ML ~~LOC~~ SOLN: 40.0000 mg | SUBCUTANEOUS | Status: DC

## 2019-05-20 MED ORDER — NOREPINEPHRINE 4 MG/250ML-% IV SOLN
0.0000 ug/min | INTRAVENOUS | Status: DC
  Administered 2019-05-20: 2 ug/min via INTRAVENOUS
  Filled 2019-05-20: qty 250

## 2019-05-20 MED ORDER — ASPIRIN 81 MG PO CHEW
324.0000 mg | CHEWABLE_TABLET | Freq: Every day | ORAL | Status: DC
  Administered 2019-05-21 – 2019-05-24 (×4): 324 mg
  Filled 2019-05-20 (×4): qty 4

## 2019-05-20 MED ORDER — GADOBUTROL 1 MMOL/ML IV SOLN
10.0000 mL | Freq: Once | INTRAVENOUS | Status: AC | PRN
  Administered 2019-05-20: 10 mL via INTRAVENOUS

## 2019-05-20 NOTE — Progress Notes (Signed)
Referring Physician(s): Code Stroke- Caryl Pina  Supervising Physician: Julieanne Cotton  Patient Status:  [facility] - In-pt  Chief Complaint: None- intubated with sedation  Subjective:  Right MCA M1 segment occlusion s/p emergent mechanical thrombectomy achieving a TICI 3 revascularization 05/19/2019 by Dr. Corliss Skains. Patient laying in bed intubated with sedation. Can spontaneously move/follows commands of right side, can spontaneously move LUE, LLE withdraws from pain. Right groin incision c/d/i.   Allergies: Lisinopril and Losartan  Medications: Prior to Admission medications   Medication Sig Start Date End Date Taking? Authorizing Provider  albuterol (VENTOLIN HFA) 108 (90 Base) MCG/ACT inhaler Inhale 2 puffs into the lungs every 4 (four) hours as needed for shortness of breath. 11/08/18  Yes [provider]  ASPIRIN 81 PO Take 81 mg by mouth daily.    Yes [provider]  atorvastatin (LIPITOR) 10 MG tablet Take 10 mg by mouth daily. 10/19/18  Yes [provider]  carvedilol (COREG) 6.25 MG tablet TAKE 1 TABLET(6.25 MG) BY MOUTH TWICE DAILY WITH A MEAL Patient taking differently: Take 6.25 mg by mouth 2 (two) times daily with a meal.  05/05/19  Yes Georgeanna Lea, MD  famotidine (PEPCID) 40 MG tablet Take 40 mg by mouth at bedtime.  10/19/18  Yes [provider]  fluticasone (FLONASE) 50 MCG/ACT nasal spray Place 2 sprays into both nostrils daily as needed for allergies or rhinitis.  01/03/19  Yes [provider]  furosemide (LASIX) 40 MG tablet Take 1 tablet (40 mg total) by mouth 2 (two) times daily. Patient taking differently: Take 20 mg by mouth 2 (two) times daily.  05/16/19  Yes Georgeanna Lea, MD  loratadine (CLARITIN) 10 MG tablet Take 10 mg by mouth daily as needed for allergies or rhinitis.  11/08/18  Yes [provider]  montelukast (SINGULAIR) 10 MG tablet Take 10 mg by mouth at bedtime.   Yes [provider]  spironolactone (ALDACTONE) 25 MG tablet Take 0.5 tablets (12.5 mg total) by mouth daily. 03/24/19  Yes Dunn, Dayna N, PA-C  Vitamin D, Ergocalciferol, (DRISDOL) 1.25 MG (50000 UT) CAPS capsule Take 50,000 Units by mouth every Monday.  03/30/19  Yes [provider]     Vital Signs: BP (!) 118/101   Pulse 82   Temp 98.4 F (36.9 C) (Axillary)   Resp 16   Wt 246 lb 4.1 oz (111.7 kg)   SpO2 100%   BMI 37.44 kg/m   Physical Exam Vitals signs and nursing note reviewed.  Constitutional:      General: She is not in acute distress.    Appearance: Normal appearance.     Comments: Intubated with sedation.  Pulmonary:     Effort: Pulmonary effort is normal. No respiratory distress.     Comments: Intubated with sedation. Skin:    General: Skin is warm and dry.     Comments: Right groin incision soft without active bleeding or hematoma.  Neurological:     Comments: Intubated with sedation. PERRL bilaterally. Can spontaneously move/follows commands of right side, can spontaneously move LUE, LLE withdraws from pain. Distal pulses 1+ bilaterally.  Psychiatric:     Comments: Intubated with sedation.     Imaging: Ct Cerebral Perfusion W Contrast  Result Date: 05/19/2019 CLINICAL DATA:  Stroke.  Right MCA occlusion EXAM: CT PERFUSION BRAIN TECHNIQUE: Multiphase CT imaging of the brain was performed following IV bolus contrast injection. Subsequent parametric perfusion maps were calculated using RAPID software. CONTRAST:  40mL  OMNIPAQUE IOHEXOL 350 MG/ML SOLN COMPARISON:  CTA earlier today. FINDINGS: Nondiagnostic CT perfusion. Two attempts were made with separate contrast injections. Both attempts show inadequate vascular enhancement for perfusion analysis. See prior CTA and CT head results IMPRESSION: Nondiagnostic CT perfusion. These results were called by telephone at the time of interpretation on 05/19/2019 at 12:10 pm to provider ERIC [facility] , who verbally  acknowledged these results. Electronically Signed   By: Franchot Gallo M.D.   On: 05/19/2019 12:24   Dg Chest Port 1 View  Result Date: 05/19/2019 CLINICAL DATA:  63 year old female with a history of stroke EXAM: PORTABLE CHEST 1 VIEW COMPARISON:  May 19, 2019 FINDINGS: Cardiomediastinal silhouette unchanged in size and contour with cardiomegaly. Interval placement of endotracheal tube which terminates suitably above the carina approximately 3.2 cm. No pneumothorax. No pleural effusion. No new confluent airspace disease. Coarsened interstitial markings bilaterally with fullness in the central vasculature. IMPRESSION: Interval placement of endotracheal tube suitably position above the carina. Cardiomegaly with chronic lung changes Electronically Signed   By: Corrie Mckusick D.O.   On: 05/19/2019 15:44   Dg Abd Portable 1v  Result Date: 05/19/2019 CLINICAL DATA:  OG tube placement. EXAM: PORTABLE ABDOMEN - 1 VIEW COMPARISON:  01/09/2019 FINDINGS: Dense retrocardiac opacification. Cardiomegaly similar to prior study. Orogastric tube passes into the proximal stomach, side port at the GE junction. Bowel gas pattern unremarkable though incompletely imaged. IMPRESSION: Orogastric tube in proximal stomach, side-port EG junction. Consolidation at the left lung base may represent developing pneumonitis. Electronically Signed   By: Zetta Bills M.D.   On: 05/19/2019 17:10   Dg Hip Port Unilat With Pelvis 1v Right  Result Date: 05/19/2019 CLINICAL DATA:  Status post fall, pain EXAM: DG HIP (WITH OR WITHOUT PELVIS) 1V PORT RIGHT COMPARISON:  None. FINDINGS: Limited examination secondary to technique and osteopenia. No acute fracture or dislocation. No aggressive osseous lesion. IMPRESSION: No acute osseous injury of the right hip. Given the patient's age and osteopenia, if there is persistent clinical concern for an occult hip fracture, a CT or MRI of the hip is recommended for increased sensitivity.  Electronically Signed   By: Kathreen Devoid   On: 05/19/2019 15:44    Labs:  CBC: Recent Labs    01/13/19 0238  01/14/19 0235 05/19/19 1102 05/19/19 1103 05/19/19 1623 05/20/19 0442  WBC 9.9  --  8.8 5.6  --   --  7.6  HGB 17.3*   < > 16.6* 16.1* 17.0* 15.3* 14.6  HCT 51.9*   < > 49.8* 48.1* 50.0* 45.0 43.6  PLT 305  --  285 208  --   --  139*   < > = values in this interval not displayed.    COAGS: Recent Labs    05/19/19 1102  INR 1.4*  APTT 28    BMP: Recent Labs    04/22/19 0000 05/04/19 1503 05/19/19 1102 05/19/19 1103 05/19/19 1623 05/20/19 0442  NA 143 142 141 141 141 140  K 4.4 4.1 4.3 4.1 3.5 3.8  CL 103 102 105 104  --  110  CO2 23 23 23   --   --  19*  GLUCOSE 150* 172* 153* 146*  --  102*  BUN 17 17 16 17   --  13  CALCIUM 9.7 9.4 9.0  --   --  8.4*  CREATININE 1.16* 1.02* 1.09* 0.90  --  0.86  GFRNONAA 50* 59* 54*  --   --  >60  GFRAA 58*  68 >60  --   --  >60    LIVER FUNCTION TESTS: Recent Labs    01/13/19 0238 01/15/19 0309 05/19/19 1102  BILITOT 1.1 0.8 1.8*  AST 41 29 27  ALT 66* 47* 21  ALKPHOS 77 70 119  PROT 7.9 8.0 6.1*  ALBUMIN 3.7 3.4* 3.1*    Assessment and Plan:  Right MCA M1 segment occlusion s/p emergent mechanical thrombectomy achieving a TICI 3 revascularization 05/19/2019 by Dr. Corliss Skains. Patient's condition improving- remains intubated/sedated, can spontaneously move/follows commands of right side, can spontaneously move LUE, LLE withdraws from pain. Right groin incision stable, distal pulses 1+ bilaterally. Plan for MR brain/MRA head and neck today. Further plans per neurology/CCM- appreciate and agree with management. NIR to follow.   Electronically Signed: Elwin Mocha, PA-C 05/20/2019, 8:52 AM   I spent a total of 25 Minutes at the the patient's bedside AND on the patient's hospital floor or unit, greater than 50% of which was counseling/coordinating care for right MCA M1 segment occlusion s/p  revascularization.

## 2019-05-20 NOTE — Evaluation (Signed)
Physical Therapy Evaluation Patient Details Name: Breanna Bennett MRN: [id number] DOB: 1956/03/05 Today's Date: 05/20/2019   History of Present Illness  63 y.o. female admitted on 05/19/19 with L sided weakness, facial droop and sensory loss.  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  She did not recieve tPA, but did go to IR for throbectomy.  Pt intubated for IR and remains intubated post op due to failed SBT.  Pt with significant PMH of tricuspid regurgitation, pre-diabeted, mitral regurgitation, renal insufficency, LBBB, HTN, chronic systolic CHF.   Clinical Impression  Seen in conjunction with OT.  RN lifted sedation so that pt would wake up a bit for our assessment.  She recognized daughter and followed most of basic commands.  She became more aware of the ETT and started attempting to reach for it with both hands.  She is moving all 4 extremities, left side a bit less briskly than R and has R gaze preference (despite ETT being on the left).  She lifted her entire trunk up off the bed several times and ultimately had to be re-sedated for safety due to restlessness and hands constantly reaching ETT.  Her daughter wants her to have intensive therapy and I agree that she will likely need intensive post acute multidisciplinary therapy at discharge.  PT to follow acutely for deficits listed below.      Follow Up Recommendations CIR    Equipment Recommendations  3in1 (PT);Other (comment)(wide/bariatric)    Recommendations for Other Services Rehab consult     Precautions / Restrictions Precautions Precautions: Fall Precaution Comments: L side is weaker      Mobility  Bed Mobility Overal bed mobility: Needs Assistance             General bed mobility comments: Pt nearly came up to long sitting from [facility] elevated ~35 degrees several times when PT/OT and daughter stimulating pt.        Modified Rankin (Stroke Patients Only) Modified Rankin (Stroke Patients Only) Pre-Morbid Rankin  Score: No significant disability Modified Rankin: Moderately severe disability           Pertinent Vitals/Pain Pain Assessment: Faces Faces Pain Scale: Hurts little more Pain Location: R leg with ROM Pain Descriptors / Indicators: Guarding;Grimacing Pain Intervention(s): Limited activity within patient's tolerance;Monitored during session;Repositioned    Home Living Family/patient expects to be discharged to:: Private residence Living Arrangements: Spouse/significant other Available Help at Discharge: Family;Available 24 hours/day Type of Home: House Home Access: Stairs to enter Entrance Stairs-Rails: Left Entrance Stairs-Number of Steps: 4 Home Layout: One level Home Equipment: Clinical cytogeneticist - 4 wheels      Prior Function Level of Independence: Needs assistance   Gait / Transfers Assistance Needed: independent with gait  ADL's / Homemaking Assistance Needed: daughter reports pt recently retired, but since retiring has declined, now requires assist for tub transfers, bathing ADL  Comments: PLOF and home setup obtained from daughter      Hand Dominance   Dominant Hand: Right    Extremity/Trunk Assessment   Upper Extremity Assessment Upper Extremity Assessment: Defer to OT evaluation    Lower Extremity Assessment Lower Extremity Assessment: RLE deficits/detail;LLE deficits/detail RLE Deficits / Details: moving bil legs spontaenously, R seems more briskly than L.  Per daughter she fell on her R side, was worried about her R leg hurting.   LLE Deficits / Details: moving leg spontaneously, seemingly less briskly than R side.     Cervical / Trunk Assessment Cervical / Trunk Assessment: Normal(R  gaze)  Communication   Communication: Other (comment)(ETT)  Cognition Arousal/Alertness: Awake/alert;Lethargic(RN turned down sedative, progressively more alert) Behavior During Therapy: Restless Overall Cognitive Status: Impaired/Different from baseline Area of  Impairment: Orientation;Attention;Memory;Following commands;Safety/judgement;Awareness;Problem solving                 Orientation Level: Disoriented to;Place;Time;Situation Current Attention Level: Focused   Following Commands: Follows one step commands with increased time;Follows one step commands inconsistently(75% of basic commands once aroused) Safety/Judgement: Decreased awareness of safety;Decreased awareness of deficits Awareness: Intellectual   General Comments: Once sedation lifted, pt restless in the bed, reaching for ETT, does seem to respond positively to her daughter mouthing her name "Thayer Ohm", follows most baic commands.        General Comments General comments (skin integrity, edema, etc.): Pt on ventilator support via ETT        Assessment/Plan    PT Assessment Patient needs continued PT services  PT Problem List Decreased strength;Decreased range of motion;Decreased activity tolerance;Decreased balance;Decreased mobility;Decreased cognition;Decreased knowledge of use of DME;Decreased safety awareness;Decreased knowledge of precautions;Cardiopulmonary status limiting activity;Obesity;Pain       PT Treatment Interventions DME instruction;Gait training;Stair training;Functional mobility training;Therapeutic activities;Therapeutic exercise;Balance training;Neuromuscular re-education;Cognitive remediation;Patient/family education;Wheelchair mobility training;Manual techniques;Modalities    PT Goals (Current goals can be found in the Care Plan section)  Acute Rehab PT Goals Patient Stated Goal: daughter wants her to rehab as much as she can. PT Goal Formulation: With family Time For Goal Achievement: 06/03/19 Potential to Achieve Goals: Good    Frequency Min 4X/week           AM-PAC PT "6 Clicks" Mobility  Outcome Measure Help needed turning from your back to your side while in a flat bed without using bedrails?: Total Help needed moving from lying on your  back to sitting on the side of a flat bed without using bedrails?: Total Help needed moving to and from a bed to a chair (including a wheelchair)?: Total Help needed standing up from a chair using your arms (e.g., wheelchair or bedside chair)?: Total Help needed to walk in hospital room?: Total Help needed climbing 3-5 steps with a railing? : Total 6 Click Score: 6    End of Session Equipment Utilized During Treatment: Oxygen(ETT) Activity Tolerance: Other (comment)(limited by restlessness) Patient left: in bed;with call bell/phone within reach;with bed alarm set;with family/visitor present Nurse Communication: Other (comment)(restlessness to turn sedative back up) PT Visit Diagnosis: Muscle weakness (generalized) (M62.81);Difficulty in walking, not elsewhere classified (R26.2);Hemiplegia and hemiparesis Hemiplegia - Right/Left: Left Hemiplegia - dominant/non-dominant: Non-dominant Hemiplegia - caused by: Cerebral infarction    Time: 9798-9211 PT Time Calculation (min) (ACUTE ONLY): 41 min   Charges:       Lurena Joiner B. Martel Galvan, PT, DPT  Acute Rehabilitation [phone] pager [phone] office  @ [facility]: [phone]   PT Evaluation $PT Eval High Complexity: 1 High          05/20/2019, 2:45 PM

## 2019-05-20 NOTE — Evaluation (Signed)
Occupational Therapy Evaluation Patient Details Name: Breanna Bennett MRN: [id number] DOB: 10/14/1955 Today's Date: 05/20/2019    History of Present Illness 63 y.o. female admitted on 05/19/19 with L sided weakness, facial droop and sensory loss.  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  She did not recieve tPA, but did go to IR for throbectomy.  Pt intubated for IR and remains intubated post op due to failed SBT.  Pt with significant PMH of tricuspid regurgitation, pre-diabeted, mitral regurgitation, renal insufficency, LBBB, HTN, chronic systolic CHF.    Clinical Impression   This 63 y/o female presents with the above. PLOF obtained via daughter who is present and supportive throughout session. PTA pt was independent with mobility, requiring some assist for bathing ADL. Pt initially very sleepy and difficulty to arouse, though as session progressed and daughter arriving pt becoming more alert/attentive and responding to daughter, responding to basic commands. Pt with spontaneous movements of bil UEs, (noted weakness greater in LUE than RUE), pt restless and attempting to reach towards ETT during session. She is currently totalA for ADL, though suspect pt to progress well with therapies. She will benefit from continued acute OT services and recommend follow up therapy services in CIR setting to progress her towards her PLOF. Will follow.     Follow Up Recommendations  CIR;Supervision/Assistance - 24 hour    Equipment Recommendations  Other (comment)(TBD)    Recommendations for Other Services Rehab consult     Precautions / Restrictions Precautions Precautions: Fall Precaution Comments: L side is weaker Restrictions Weight Bearing Restrictions: No      Mobility Bed Mobility Overal bed mobility: Needs Assistance             General bed mobility comments: Pt nearly came up to long sitting from [facility] elevated ~35 degrees several times when PT/OT and daughter stimulating pt.     Transfers                      Balance                                           ADL either performed or assessed with clinical judgement   ADL Overall ADL's : Needs assistance/impaired                                       General ADL Comments: pt currently totalA for ADL, suspect improvements once more awake and extubated, unable to attempt simple ADL as pt focused on using UEs to reach ETT     Vision   Vision Assessment?: Vision impaired- to be further tested in functional context Additional Comments: pt with difficulty scanning past midline towards R  visual field, even with daughter placed to her R side      Perception     Praxis      Pertinent Vitals/Pain Pain Assessment: Faces Faces Pain Scale: Hurts little more Pain Location: R leg with ROM Pain Descriptors / Indicators: Guarding;Grimacing Pain Intervention(s): Limited activity within patient's tolerance;Monitored during session;Repositioned     Hand Dominance Right   Extremity/Trunk Assessment Upper Extremity Assessment Upper Extremity Assessment: RUE deficits/detail;LUE deficits/detail RUE Deficits / Details: moving RUE to attempt reaching towards ETT, squeezing on command intermittently and intermittent spontaneous movements, RUE stronger than LUE LUE  Deficits / Details: moving LUE to attempt reaching towards ETT, intermittent spontaneous movements, LUE weaker than RUE, edema also noted   Lower Extremity Assessment Lower Extremity Assessment: Defer to PT evaluation RLE Deficits / Details: moving bil legs spontaenously, R seems more briskly than L.  Per daughter she fell on her R side, was worried about her R leg hurting.   LLE Deficits / Details: moving leg spontaneously, seemingly less briskly than R side.    Cervical / Trunk Assessment Cervical / Trunk Assessment: Normal(R gaze)   Communication Communication Communication: Other (comment)(ETT)   Cognition  Arousal/Alertness: Awake/alert;Lethargic(RN turned down sedative, progressively more alert) Behavior During Therapy: Restless Overall Cognitive Status: Impaired/Different from baseline Area of Impairment: Orientation;Attention;Memory;Following commands;Safety/judgement;Awareness;Problem solving                 Orientation Level: Disoriented to;Place;Time;Situation Current Attention Level: Focused   Following Commands: Follows one step commands with increased time;Follows one step commands inconsistently(75% of basic commands once aroused) Safety/Judgement: Decreased awareness of safety;Decreased awareness of deficits Awareness: Intellectual   General Comments: Once sedation lifted, pt restless in the bed, reaching for ETT, does seem to respond positively to her daughter mouthing her name "Thayer Ohm", follows most baic commands.     General Comments  Pt on ventilator support via ETT    Exercises     Shoulder Instructions      Home Living Family/patient expects to be discharged to:: Private residence Living Arrangements: Spouse/significant other Available Help at Discharge: Family;Available 24 hours/day Type of Home: House Home Access: Stairs to enter [employer] of Steps: 4 Entrance Stairs-Rails: Left Home Layout: One level     Bathroom Shower/Tub: Chief Strategy Officer: Standard     Home Equipment: Emergency planning/management officer - 4 wheels          Prior Functioning/Environment Level of Independence: Needs assistance  Gait / Transfers Assistance Needed: independent with gait ADL's / Homemaking Assistance Needed: daughter reports pt recently retired, but since retiring has declined, now requires assist for tub transfers, bathing ADL   Comments: PLOF and home setup obtained from daughter         OT Problem List: Decreased strength;Decreased range of motion;Decreased activity tolerance;Impaired balance (sitting and/or standing);Decreased safety  awareness;Decreased knowledge of use of DME or AE;Decreased knowledge of precautions;Obesity;Impaired UE functional use;Decreased cognition;Decreased coordination;Impaired vision/perception      OT Treatment/Interventions: Self-care/ADL training;Therapeutic exercise;Neuromuscular education;DME and/or AE instruction;Therapeutic activities;Cognitive remediation/compensation;Visual/perceptual remediation/compensation;Patient/family education;Balance training    OT Goals(Current goals can be found in the care plan section) Acute Rehab OT Goals Patient Stated Goal: daughter wants her to rehab as much as she can. OT Goal Formulation: With patient/family Time For Goal Achievement: 06/03/19 Potential to Achieve Goals: Good  OT Frequency: Min 2X/week   Barriers to D/C:            Co-evaluation              AM-PAC OT "6 Clicks" Daily Activity     Outcome Measure Help from another person eating meals?: Total Help from another person taking care of personal grooming?: Total Help from another person toileting, which includes using toliet, bedpan, or urinal?: Total Help from another person bathing (including washing, rinsing, drying)?: Total Help from another person to put on and taking off regular upper body clothing?: Total Help from another person to put on and taking off regular lower body clothing?: Total 6 Click Score: 6   End of Session Equipment Utilized During Treatment: Other (comment)(vent)  Nurse Communication: Mobility status  Activity Tolerance: Patient tolerated treatment well Patient left: in bed;with call bell/phone within reach;with bed alarm set;with family/visitor present  OT Visit Diagnosis: Muscle weakness (generalized) (M62.81);History of falling (Z91.81);Other symptoms and signs involving cognitive function;Other symptoms and signs involving the nervous system (R29.898)                Time: 5697-9480 OT Time Calculation (min): 42 min Charges:  OT General  Charges $OT Visit: 1 Visit OT Evaluation $OT Eval High Complexity: 1 High OT Treatments $Self Care/Home Management : 8-22 mins  Marcy Siren, OT Supplemental Rehabilitation Services Pager [phone] Office [phone]   Orlando Penner 05/20/2019, 4:33 PM

## 2019-05-20 NOTE — Progress Notes (Signed)
RT NOTE: RT transported patient from 4N19 to MRI and back with RN. No complications. VS stable. RT will continue to monitor.

## 2019-05-20 NOTE — Progress Notes (Signed)
NAME:  Breanna Bennett, MRN:  [id number], DOB:  [birth year], LOS: 1 ADMISSION DATE:  05/19/2019, CONSULTATION DATE:  05/19/2019 REFERRING MD:  Dr. Cheral Marker, neurology, CHIEF COMPLAINT:  Left side weakness and speech difficulty   Brief History   63 year old female who was transported from [place] ED to [facility] hospital due to stroke 2/2 acute right MCA occlusion. She underwent arterial thrombectomy by IR at [place] consulted given several comorbidities and for mechanical ventilation post procedure and further monitoring.  History of present illness   63 year old female with past medical history of morbid obesity, CHF (nonischemic cardiomyopathy), HLD, HTN, prediabetes, presented to [facility] emergency department with acute left-sided weakness, and speech difficulty. She was last seen 9/23 at 9 PM by her family before she went to bed. She woke up at 1 AM and fell and injured her right hip.  She was found by her family on the floor and was taken to [facility] ER. At that time, patient was awake and able to explain the fall and hip injury but had speech difficulty and left side hemiplegia, left hemisensory loss and facial drrop. She was evaluated by telemetry neurology at [facility], and was not a candidate for TPA based on the time window. CT head showed no acute hypodensity but CTA of head and neck revealed a right MCA occlusion.  She was emergently transferred to [facility] to perform CT perfusion study and possible intervention by IR.  Past Medical History  chronic systolic CHF, HLD, HTN, NICM, morbid obesity and pre-diabetes.   Significant Hospital Events   9/24 Arterial thrombectomy of right MCA occlusion  Consults:  IR PCCM  Procedures:  9/24 percutaneous arterial thrombectomy: RT CCA arteriogram followed by complete revascularization of RT MCA M 1 occlusion with x 1 pass with 27mmx 40 mm Solitaire X retriever device achieving a TICI 3 revascularization  Significant Diagnostic Tests:   9/24 CT cerebral perfusion with contrast: : Nondiagnostic CT perfusion. 9/25 brain MRI without contrast> 9/24 Post Tx CT head without contrast> No ICH, no gross mass effect 9/24>DG hip unilateral>  No acute osseous injury of the right hip. Given the patient's age and osteopenia, if there is persistent clinical concern for an occult hip fracture, a CT or MRI of the hip is recommended for increased sensitivity.  Micro Data:  9/24 SARS COVID-19  > negative 9/24 MRSA PCR> negative Antimicrobials:    Interim history/subjective:  Patient is sedated post IR procedure Failed SBT 9/25 am >> apnea on 5 propofol Agitated with Echo, propofol increased to 20 For MRI this am Per nursing is following commands Limited movement R hip>> DG hip no acute injury> May need MRI hip + 1100 cc's  Objective   Blood pressure (!) 118/101, pulse 82, temperature 98.4 F (36.9 C), temperature source Axillary, resp. rate 16, weight 111.7 kg, SpO2 100 %.    Vent Mode: PRVC FiO2 (%):  [40 %-100 %] 40 % Set Rate:  [16 bmp-18 bmp] 16 bmp Vt Set:  [510 mL] 510 mL PEEP:  [5 cmH20] 5 cmH20 Plateau Pressure:  [20 cmH20-21 cmH20] 20 cmH20  Intake/Output Summary (Last 24 hours) at 05/20/2019 0836 Last data filed at 05/20/2019 0700 Gross per 24 hour  Intake 2304.22 ml  Output 1180 ml  Net 1124.22 ml   Filed Weights   05/19/19 1515  Weight: 111.7 kg    Examination: General: Sedated and intubated, , Appears well-developed and well-nourished. No distress.  Head: Normocephalic and atraumatic.  Eyes:  Exophthalmos, Pupils symmetric, nl size and reactive to light Cardiovascular: S1, Decreased S2, RRR, No  Rub, gallop, or  murmur Respiratory: Intubated and sedated, Bilateral chest excursion,  no crackles,  no wheezes.  GI: Soft. Bowel sounds are normal. No distension. NT, obese Musculoskeletal: 2+ pitting edema, no obvious deformities noted Neurological: Sedated with RASS -2,  per nursing followed commands, she is moving right and L upper extremities with purpose, She is moving L leg, but not R leg >> suspect 2/2 hip injury with fall.  Resolved Hospital Problem list     Assessment & Plan:  MCA stroke secondary to right MCA occlusion: Status post revascularization on arrival to [facility] 9/24  Risk factors: HLD, HTN, morbid obesity, CHF, prediabetes HGB A1C>>7.2 Plan -Per neurology -No antiplatelet for at least 24 hours following VIR. (Can restart if repeat CT head at 24 hours is negative for hemorrhagic conversion.) For MRI 9/25 -PT OT eval and treat -NPO >> Consider TF if unable to extubate next 24 hours -Speech eval -IV fluids at 75 mL/h - Strict I&O - monitor + fluid balance daily -TTE completed, results pending -Lipid panel >> HDL < 15 -Cardiac monitoring -UDS negative   Intubated for airway protection 2/2 revascularization procedure Failed SBT 9/25 2/2 apnea   Currently not overbreathing vent Plan WUA Q am Wean sedation as able for below noted RASS SBT Q am as able RASS goal 0 to -1 ABG 1 hour after MRI on 9/25 CXR 9/26 am and prn  ABG 9/26 am and prn Wean FiO2 and PEEP as able    CHF: Last heart cath 01/13/2019: dilated nonischemic cardiomyopathy, she might be a candidate for BiV ICD/CRT. Hypervolemic on exam (LEE) EKG with PVC. Electrolytes normal.  EF 20% Plan -Trend BNP - Trend Mag - F/u TTE results pending - Trend CXR -Lasix 40 mg per tube. twice daily -Carvedilol 6.25 mg p.o. twice daily -Spironolactone 12.5 mg daily - Tele  -Strict I's/O - monitor + fluid balance daily - Weight daily -Trend Troponin  Renal Creatinine 0.86 Plan 1100 cc out last 24 hours Trend BMET Trend Mag Maintain renal perfusion Replete electrolytes as indicated   Thrombocytopenia Platelet drop from 208 to 139 last 24 hours Plan Trend CBC Trend Coags anticoag on hold x 24 hours post thrombectomy Monitor for bleeding  Right  hip pain 2/2 fall:  Concerning for Fx. No leg length discrepancy Distal pulses are present FU hip x ray no acute osseous injury of the right hip. Plan - Consider MRI hip    Exophthalmos -TSH>> 0.884 Plan Monitor  Pre DM: -SSI -CBG monitoring HGB A1c noted  Best practice:  Diet: NPO. speech eval Pain/Anxiety/Delirium protocol (if indicated): None VAP protocol (if indicated): None DVT prophylaxis: SCDs (holding anticoagulation for 24 hours post IR thrombectomy) GI prophylaxis: Senna Glucose control: SSI, CBG Mobility: Bedrest Code Status: Full Family Communication: Dr. Corliss Skains spoke with Daughter [place] [phone] Disposition: Neuro ICU  Labs   CBC: Recent Labs  Lab 05/19/19 1102 05/19/19 1103 05/19/19 1623 05/20/19 0442  WBC 5.6  --   --  7.6  NEUTROABS 3.5  --   --  5.5  HGB 16.1* 17.0* 15.3* 14.6  HCT 48.1* 50.0* 45.0 43.6  MCV 85.7  --   --  83.4  PLT 208  --   --  139*    Basic Metabolic Panel: Recent Labs  Lab 05/19/19 1102 05/19/19 1103 05/19/19 1623 05/19/19 1700 05/20/19 0442  NA 141 141 141  --  140  K 4.3 4.1 3.5  --  3.8  CL 105 104  --   --  110  CO2 23  --   --   --  19*  GLUCOSE 153* 146*  --   --  102*  BUN 16 17  --   --  13  CREATININE 1.09* 0.90  --   --  0.86  CALCIUM 9.0  --   --   --  8.4*  MG  --   --   --  1.7  --    GFR: Estimated Creatinine Clearance: 87.7 mL/min (by C-G formula based on SCr of 0.86 mg/dL). Recent Labs  Lab 05/19/19 1102 05/20/19 0442  WBC 5.6 7.6    Liver Function Tests: Recent Labs  Lab 05/19/19 1102  AST 27  ALT 21  ALKPHOS 119  BILITOT 1.8*  PROT 6.1*  ALBUMIN 3.1*   No results for input(s): LIPASE, AMYLASE in the last 168 hours. No results for input(s): AMMONIA in the last 168 hours.  ABG    Component Value Date/Time   PHART 7.470 (H) 05/19/2019 1623   PCO2ART 29.8 (L) 05/19/2019 1623   PO2ART 342.0 (H) 05/19/2019 1623   HCO3 21.7 05/19/2019 1623   TCO2 23  05/19/2019 1623   ACIDBASEDEF 1.0 05/19/2019 1623   O2SAT 100.0 05/19/2019 1623     Coagulation Profile: Recent Labs  Lab 05/19/19 1102  INR 1.4*    Cardiac Enzymes: No results for input(s): CKTOTAL, CKMB, CKMBINDEX, TROPONINI in the last 168 hours.  HbA1C: Hgb A1c MFr Bld  Date/Time Value Ref Range Status  05/20/2019 04:42 AM 7.2 (H) 4.8 - 5.6 % Final    Comment:    (NOTE) Pre diabetes:          5.7%-6.4% Diabetes:              >6.4% Glycemic control for   <7.0% adults with diabetes   05/19/2019 02:30 PM 7.2 (H) 4.8 - 5.6 % Final    Comment:    (NOTE) Pre diabetes:          5.7%-6.4% Diabetes:              >6.4% Glycemic control for   <7.0% adults with diabetes     Past Medical History  She,  has a past medical history of Chronic systolic CHF (congestive heart failure) (HCC), Elevated hemoglobin (HCC), GERD (gastroesophageal reflux disease), Hyperlipidemia, Hypertension, LBBB (left bundle branch block), Mild renal insufficiency, Mitral regurgitation, NICM (nonischemic cardiomyopathy) (HCC), Pre-diabetes, and Tricuspid regurgitation.   Surgical History    Past Surgical History:  Procedure Laterality Date  . RIGHT/LEFT HEART CATH AND CORONARY ANGIOGRAPHY N/A 01/13/2019   Procedure: RIGHT/LEFT HEART CATH AND CORONARY ANGIOGRAPHY;  Surgeon: Runell GessBerry, Jonathan J, MD;  Location: [facility] INVASIVE CV LAB;  Service: Cardiovascular;  Laterality: N/A;  . TUBAL LIGATION       Social History   reports that she has never smoked. She has never used smokeless tobacco. She reports that she does not drink alcohol or use drugs.   Family History   Her family history includes CAD in her mother; Colon cancer in her father; Heart failure in her mother; Hypertension in her mother.   Allergies Allergies  Allergen Reactions  . Lisinopril Cough  . Losartan Cough     Home Medications  Prior to Admission medications   Medication Sig Start Date End Date Taking? Authorizing Provider   albuterol (VENTOLIN HFA) 108 (90 Base) MCG/ACT inhaler  Inhale 2 puffs into the lungs every 4 (four) hours as needed for shortness of breath. 11/08/18   [provider]  ASPIRIN 81 PO Take 1 tablet by mouth daily.    [provider]  atorvastatin (LIPITOR) 10 MG tablet Take 10 mg by mouth daily. 10/19/18   [provider]  carvedilol (COREG) 6.25 MG tablet TAKE 1 TABLET(6.25 MG) BY MOUTH TWICE DAILY WITH A MEAL 05/05/19   Georgeanna Lea, MD  famotidine (PEPCID) 40 MG tablet Take 1 tablet (40 mg total) by mouth daily. 10/19/18   [provider]  fluticasone (FLONASE) 50 MCG/ACT nasal spray Place 2 sprays into both nostrils daily. 01/03/19   [provider]  furosemide (LASIX) 40 MG tablet Take 1 tablet (40 mg total) by mouth 2 (two) times daily. 05/16/19   Georgeanna Lea, MD  loratadine (CLARITIN) 10 MG tablet Take 10 mg by mouth daily. 11/08/18   [provider]  montelukast (SINGULAIR) 10 MG tablet Take 10 mg by mouth at bedtime.    [provider]  spironolactone (ALDACTONE) 25 MG tablet Take 0.5 tablets (12.5 mg total) by mouth daily. 03/24/19   Dunn, Tacey Ruiz, PA-C  Vitamin D, Ergocalciferol, (DRISDOL) 1.25 MG (50000 UT) CAPS capsule Take 1 capsule by mouth once a week. 03/30/19   [provider]     Critical care APP  time:30 minutes    Bevelyn Ngo, MSN, AGACNP-BC [facility] Pulmonary/Critical Care Medicine Pager # [phone] After 4 pm please call [phone] 05/20/2019 8:36 AM

## 2019-05-20 NOTE — Progress Notes (Signed)
RT NOTE: RT called to bedside due to patient self extubation. Patient is not in distress, sats are 98% and says she is comfortable. No stridor noted. RT will continue to monitor as needed.

## 2019-05-20 NOTE — Progress Notes (Signed)
Rehab Admissions Coordinator Note:  Per PT recommendation, patient was screened by Michel Santee for appropriateness for an Inpatient Acute Rehab Consult.  At this time, note pt still intubated.  Will follow along post-extubation to determine whether rehab consult is needed.    Michel Santee 05/20/2019, 2:57 PM  I can be reached at [phone].

## 2019-05-20 NOTE — Progress Notes (Signed)
  Echocardiogram 2D Echocardiogram with definity has been performed.   Breanna Bennett M 05/20/2019, 9:02 AM

## 2019-05-20 NOTE — Progress Notes (Signed)
STROKE TEAM PROGRESS NOTE   INTERVAL HISTORY RN at bedside. Pt still intubated on propofol. BP on the low side, will add levophed. Once propofol on hold, pt is able to move LUE slightly against gravity with pain and LLE 2/5 withdraw with pain. MRI pending.   Vitals:   05/20/19 0405 05/20/19 0500 05/20/19 0600 05/20/19 0700  BP:  105/70 104/65 (!) 118/103  Pulse: 76 63 64 86  Resp: 16 16 16 16   Temp:      TempSrc:      SpO2: 100% 100% 100% 100%  Weight:        CBC:  Recent Labs  Lab 05/19/19 1102  05/19/19 1623 05/20/19 0442  WBC 5.6  --   --  7.6  NEUTROABS 3.5  --   --  5.5  HGB 16.1*   < > 15.3* 14.6  HCT 48.1*   < > 45.0 43.6  MCV 85.7  --   --  83.4  PLT 208  --   --  139*   < > = values in this interval not displayed.    Basic Metabolic Panel:  Recent Labs  Lab 05/19/19 1102 05/19/19 1103 05/19/19 1623 05/19/19 1700 05/20/19 0442  NA 141 141 141  --  140  K 4.3 4.1 3.5  --  3.8  CL 105 104  --   --  110  CO2 23  --   --   --  19*  GLUCOSE 153* 146*  --   --  102*  BUN 16 17  --   --  13  CREATININE 1.09* 0.90  --   --  0.86  CALCIUM 9.0  --   --   --  8.4*  MG  --   --   --  1.7  --    Lipid Panel:     Component Value Date/Time   CHOL 96 05/20/2019 0441   TRIG 90 05/20/2019 0441   HDL 15 (L) 05/20/2019 0441   CHOLHDL 6.4 05/20/2019 0441   VLDL 18 05/20/2019 0441   LDLCALC 63 05/20/2019 0441   HgbA1c:  Lab Results  Component Value Date   HGBA1C 7.2 (H) 05/20/2019   Urine Drug Screen:     Component Value Date/Time   LABOPIA NONE DETECTED 05/19/2019 1454   COCAINSCRNUR NONE DETECTED 05/19/2019 1454   LABBENZ NONE DETECTED 05/19/2019 1454   AMPHETMU NONE DETECTED 05/19/2019 1454   THCU NONE DETECTED 05/19/2019 1454   LABBARB NONE DETECTED 05/19/2019 1454    Alcohol Level     Component Value Date/Time   ETH <10 05/19/2019 1102    IMAGING Ct Cerebral Perfusion W Contrast  Result Date: 05/19/2019 CLINICAL DATA:  Stroke.  Right MCA  occlusion EXAM: CT PERFUSION BRAIN TECHNIQUE: Multiphase CT imaging of the brain was performed following IV bolus contrast injection. Subsequent parametric perfusion maps were calculated using RAPID software. CONTRAST:  10mL OMNIPAQUE IOHEXOL 350 MG/ML SOLN COMPARISON:  CTA earlier today. FINDINGS: Nondiagnostic CT perfusion. Two attempts were made with separate contrast injections. Both attempts show inadequate vascular enhancement for perfusion analysis. See prior CTA and CT head results IMPRESSION: Nondiagnostic CT perfusion. These results were called by telephone at the time of interpretation on 05/19/2019 at 12:10 pm to provider [facility] , who verbally acknowledged these results. Electronically Signed   By: Franchot Gallo M.D.   On: 05/19/2019 12:24   Dg Chest Port 1 View  Result Date: 05/19/2019 CLINICAL DATA:  63 year old female with a  history of stroke EXAM: PORTABLE CHEST 1 VIEW COMPARISON:  May 19, 2019 FINDINGS: Cardiomediastinal silhouette unchanged in size and contour with cardiomegaly. Interval placement of endotracheal tube which terminates suitably above the carina approximately 3.2 cm. No pneumothorax. No pleural effusion. No new confluent airspace disease. Coarsened interstitial markings bilaterally with fullness in the central vasculature. IMPRESSION: Interval placement of endotracheal tube suitably position above the carina. Cardiomegaly with chronic lung changes Electronically Signed   By: Gilmer Mor D.O.   On: 05/19/2019 15:44   Dg Abd Portable 1v  Result Date: 05/19/2019 CLINICAL DATA:  OG tube placement. EXAM: PORTABLE ABDOMEN - 1 VIEW COMPARISON:  01/09/2019 FINDINGS: Dense retrocardiac opacification. Cardiomegaly similar to prior study. Orogastric tube passes into the proximal stomach, side port at the GE junction. Bowel gas pattern unremarkable though incompletely imaged. IMPRESSION: Orogastric tube in proximal stomach, side-port EG junction. Consolidation at the left  lung base may represent developing pneumonitis. Electronically Signed   By: Donzetta Kohut M.D.   On: 05/19/2019 17:10   Dg Hip Port Unilat With Pelvis 1v Right  Result Date: 05/19/2019 CLINICAL DATA:  Status post fall, pain EXAM: DG HIP (WITH OR WITHOUT PELVIS) 1V PORT RIGHT COMPARISON:  None. FINDINGS: Limited examination secondary to technique and osteopenia. No acute fracture or dislocation. No aggressive osseous lesion. IMPRESSION: No acute osseous injury of the right hip. Given the patient's age and osteopenia, if there is persistent clinical concern for an occult hip fracture, a CT or MRI of the hip is recommended for increased sensitivity. Electronically Signed   By: Elige Ko   On: 05/19/2019 15:44   Cerebral Angiogram 9/24/22020 S/P RT CCA arteriogram followed by complete revascularization of RT MCA M 1 occlusion with x 1 pass with 40 mm Solitaire X retriever device achieving a TICI 3 revascularization.Marland Kitchen  PHYSICAL EXAM  Temp:  [93 F (33.9 C)-98.4 F (36.9 C)] 98 F (36.7 C) (09/25 0830) Pulse Rate:  [60-86] 60 (09/25 0900) Resp:  [12-19] 16 (09/25 0900) BP: (93-134)/(57-103) 93/57 (09/25 0900) SpO2:  [96 %-100 %] 100 % (09/25 0900) Arterial Line BP: (85-137)/(56-96) 93/74 (09/25 0900) FiO2 (%):  [40 %-100 %] 40 % (09/25 0755) Weight:  [111.7 kg] 111.7 kg (09/24 1515)  General - Well nourished, well developed, intubated on sedation.  Ophthalmologic - fundi not visualized due to noncooperation.  Cardiovascular - Regular rate and rhythm.  Neuro - intubated on propofol, eyes closed, not following commands. With forced eye opening, exophthalmos, eyes in mid position, not blinking to visual threat, doll's eyes present, not tracking, PERRL. Corneal reflex present, gag and cough present. Breathing over the vent.  Facial symmetry not able to test due to ET tube.  Tongue protrusion not cooperative. With holding off propofol, pt moving spontaneously and purposefully with RUE and  RLE against gravity, with pain stimulation, LUE slight against gravity and LLE 2/5 withdraw to pain. DTR 1+ and no babinski. Sensation, coordination and gait not tested.   ASSESSMENT/PLAN [name] is a 63 y.o. female with history of HTN, HLD, CHF, pre-diabetes, morbid obesity, LBBB and GERD presenting to [facility] who woke with L sided weakness, L hemisensory deficit, speech difficulty and L facial droop. To [facility] for IR.   Stroke: R MCA infarct due to right M1 occlusion s/p IR with TICI3 reperfusion - embolic secondary to severe cardiomyopathy with low EF  CT head [facility]) no hypodensity  CTA head & neck ([place]) R MCA occlusion   CT perfusion nondiagnostic  Cerebral angio R M1 occlusion w/ TICI3 revascularization    Post IR CT no ICH or gross mass effect   MRI  pending  MRA head and neck pending   2D Echo EF < 20%  LE venous doppler pending - b/l LE edema  LDL 63  HgbA1c 7.2  SCDs for VTE prophylaxis. Added Lovenox 40 mg sq daily    aspirin 81 mg daily prior to admission, now on ASA. May consider anticoagulation due to low EF after MRI resulted.  Therapy recommendations:  pending   Disposition:  pending   Acute Respiratory Failure  Intubated for IR  Remains on vent post IR  On sedated  Consolidation LLL, ? Pneumonitis  Plan for extubation after MRI   Chronic systolic Congestive heart failure HFrEF w/ NICM  EF < 20%  Home meds:  lasix 40, spironolactone 12.5, coreg 6.25  BNP 3910.8   B/l pedal edema  Gentle hydration, now on IVF @ 40  Consider cardiology consult in am  Will need anticoagulation for stroke prevention. Pending MRI to decide timing to start [facility]  Hypertension  Home meds:  Coreg 6.25 bid . BP goal 120-140 per IR x 24h post IR . Long-term BP goal normotensive  Hyperlipidemia  Home meds:  lipitor 10  LDL 63, goal < 70  Will resume lipitor once po access  Continue statin at discharge  Diabetes type II  Uncontrolled  Home meds:  none  HgbA1c 7.2, goal < 7.0  CBGs  SSI  Dysphagia . Secondary to stroke . NPO . Speech on board . Resume home meds once po access   Other Stroke Risk Factors  Morbid Obesity, Body mass index is 37.44 kg/m., recommend weight loss, diet and exercise as appropriate   Other Active Problems  Exophthalmos, TSH normal  R hip xray neg for fx  Hospital day # 1  This patient is critically ill due to right brain stroke, right MCA occlusion, s/p thrombectomy, cardiomyopathy, low EF, hypotension, respiratory failure and at significant risk of neurological worsening, death form recurrent stroke, hemorrhagic conversion seizure, heart failure, shock. This patient's care requires constant monitoring of vital signs, hemodynamics, respiratory and cardiac monitoring, review of multiple databases, neurological assessment, discussion with family, other specialists and medical decision making of high complexity. I spent 40 minutes of neurocritical care time in the care of this patient. I discussed with Dr. Denese Killings.  Marvel Plan, MD PhD Stroke Neurology 05/20/2019 6:26 PM    To contact Stroke Continuity provider, please refer to [email or web address]. After hours, contact General Neurology

## 2019-05-20 NOTE — Progress Notes (Signed)
Patient self extubated at 1650 . RT called to bedside. MD notified. Oxygen applied. Mouth care and suctioning performed. Oxygen sats adequate. Family notified. Will continue to monitor.

## 2019-05-21 ENCOUNTER — Inpatient Hospital Stay (HOSPITAL_COMMUNITY): Payer: BLUE CROSS/BLUE SHIELD

## 2019-05-21 DIAGNOSIS — J9600 Acute respiratory failure, unspecified whether with hypoxia or hypercapnia: Secondary | ICD-10-CM | POA: Diagnosis not present

## 2019-05-21 DIAGNOSIS — I63411 Cerebral infarction due to embolism of right middle cerebral artery: Principal | ICD-10-CM

## 2019-05-21 DIAGNOSIS — R609 Edema, unspecified: Secondary | ICD-10-CM

## 2019-05-21 LAB — MAGNESIUM: Magnesium: 1.7 mg/dL (ref 1.7–2.4)

## 2019-05-21 LAB — BASIC METABOLIC PANEL
Anion gap: 12 (ref 5–15)
BUN: 12 mg/dL (ref 8–23)
CO2: 16 mmol/L — ABNORMAL LOW (ref 22–32)
Calcium: 8.3 mg/dL — ABNORMAL LOW (ref 8.9–10.3)
Chloride: 112 mmol/L — ABNORMAL HIGH (ref 98–111)
Creatinine, Ser: 1.04 mg/dL — ABNORMAL HIGH (ref 0.44–1.00)
GFR calc Af Amer: >60 mL/min (ref >60)
GFR calc non Af Amer: 57 mL/min — ABNORMAL LOW (ref >60)
Glucose, Bld: 112 mg/dL — ABNORMAL HIGH (ref 70–99)
Potassium: 3.7 mmol/L (ref 3.5–5.1)
Sodium: 140 mmol/L (ref 135–145)

## 2019-05-21 LAB — GLUCOSE, CAPILLARY
Glucose-Capillary: 101 mg/dL — ABNORMAL HIGH (ref 70–99)
Glucose-Capillary: 106 mg/dL — ABNORMAL HIGH (ref 70–99)
Glucose-Capillary: 122 mg/dL — ABNORMAL HIGH (ref 70–99)
Glucose-Capillary: 83 mg/dL (ref 70–99)
Glucose-Capillary: 96 mg/dL (ref 70–99)
Glucose-Capillary: 96 mg/dL (ref 70–99)
Glucose-Capillary: 97 mg/dL (ref 70–99)

## 2019-05-21 LAB — CBC
HCT: 45.9 % (ref 36.0–46.0)
Hemoglobin: 15.3 g/dL — ABNORMAL HIGH (ref 12.0–15.0)
MCH: 28.1 pg (ref 26.0–34.0)
MCHC: 33.3 g/dL (ref 30.0–36.0)
MCV: 84.4 fL (ref 80.0–100.0)
Platelets: 206 10*3/uL (ref 150–400)
RBC: 5.44 MIL/uL — ABNORMAL HIGH (ref 3.87–5.11)
RDW: 17.4 % — ABNORMAL HIGH (ref 11.5–15.5)
WBC: 9.2 10*3/uL (ref 4.0–10.5)
nRBC: 0 % (ref 0.0–0.2)

## 2019-05-21 LAB — PHOSPHORUS: Phosphorus: 4.5 mg/dL (ref 2.5–4.6)

## 2019-05-21 LAB — BRAIN NATRIURETIC PEPTIDE: B Natriuretic Peptide: 2659.4 pg/mL — ABNORMAL HIGH (ref 0.0–100.0)

## 2019-05-21 IMAGING — DX DG CHEST 1V PORT
1 series · 1 of 1 positions shown · non-contrast
Comparison: [DATE]

CLINICAL DATA: 63-year-old female with a history of respiratory
failure

EXAM:
PORTABLE CHEST 1 VIEW

[chest ap]
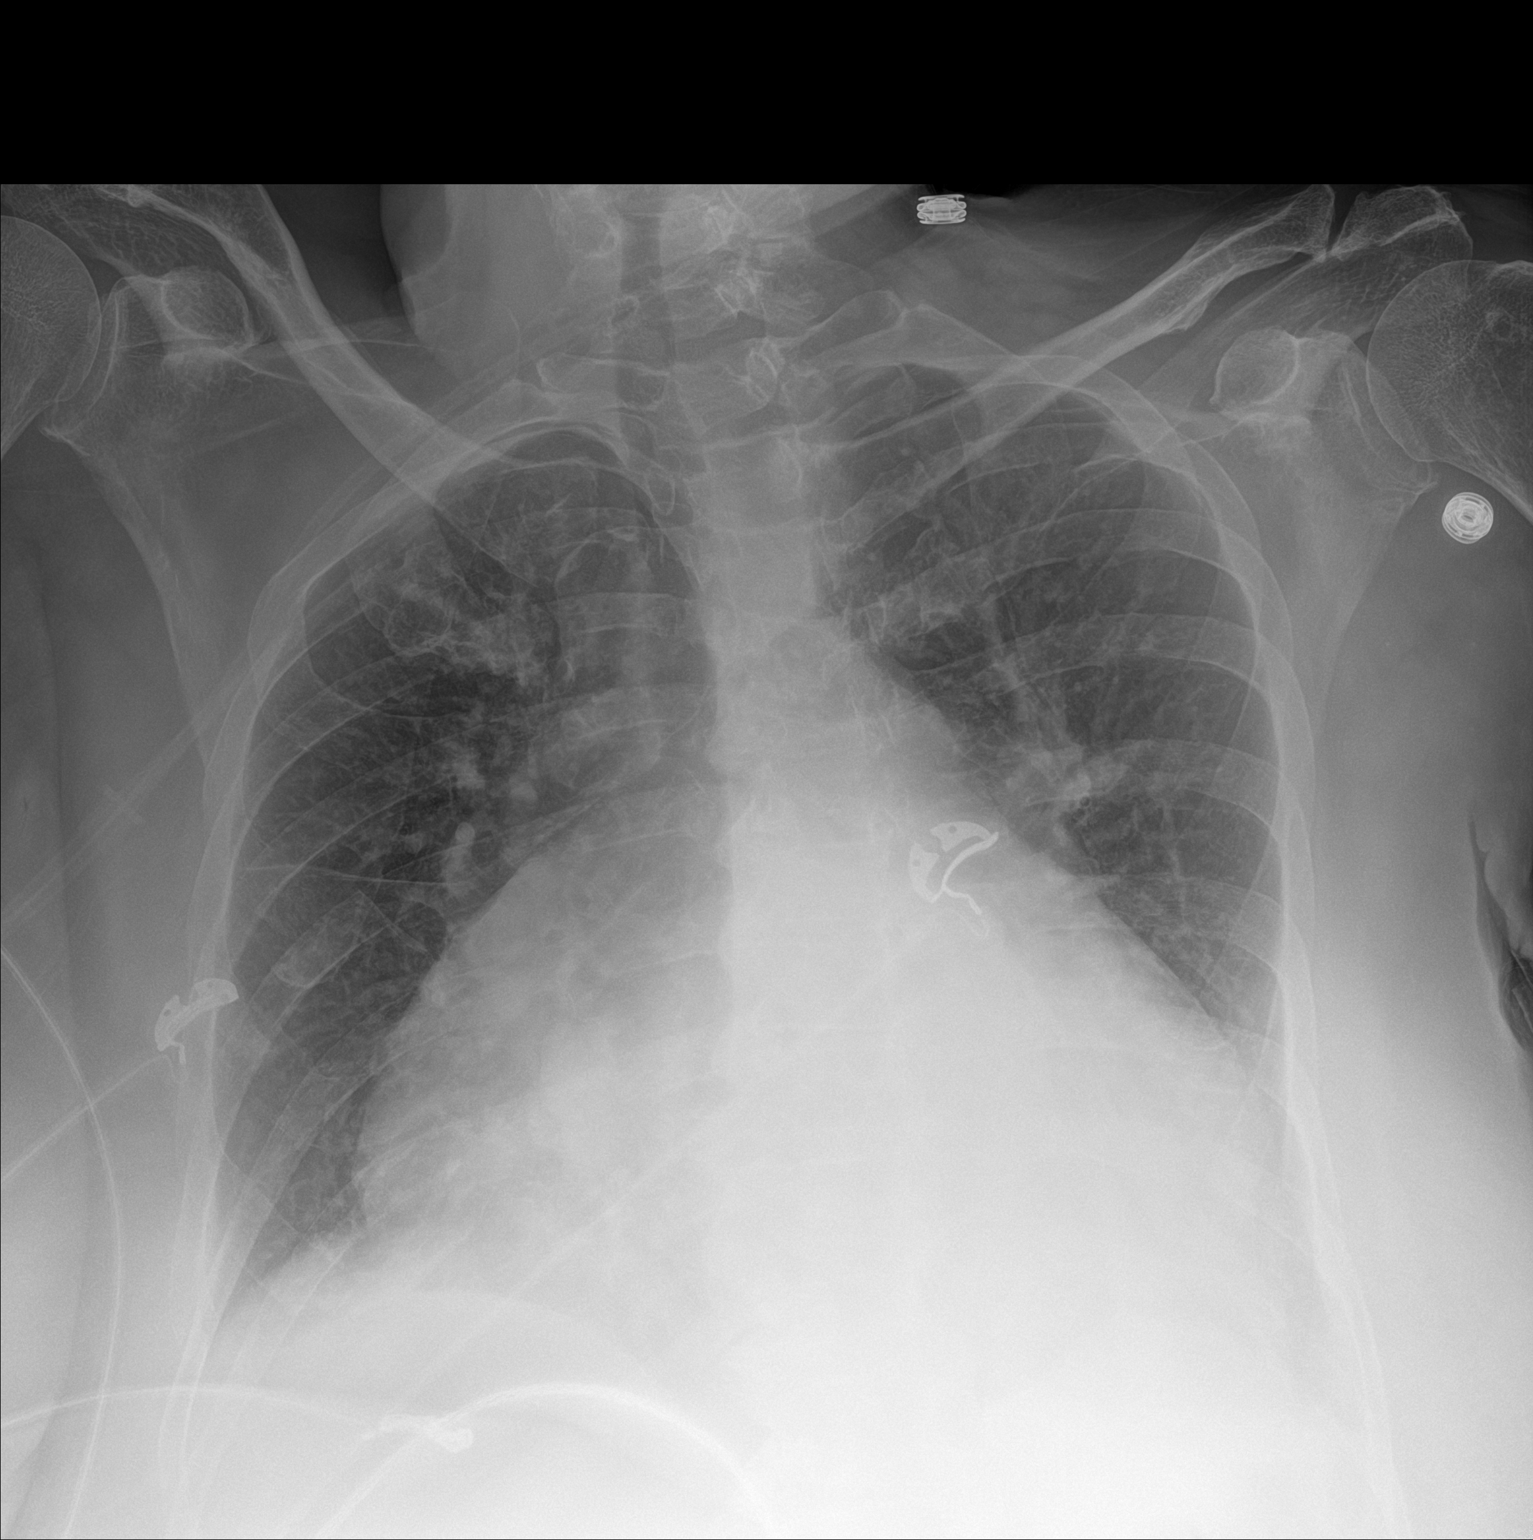

[1 of 1 positions shown; findings below may reference images not displayed]

FINDINGS: Cardiomegaly again demonstrated.

No pneumothorax.

Blunting of the bilateral costophrenic angles. Coarsened
interstitial markings throughout with thickening of the minor
fissure. Interlobular septal thickening.

Interval removal of the endotracheal tube.

No displaced fracture
IMPRESSION: Evidence of early edema with cardiomegaly.

Likely trace pleural effusions.

Interval removal of the endotracheal tube

## 2019-05-21 MED ORDER — ORAL CARE MOUTH RINSE
15.0000 mL | Freq: Two times a day (BID) | OROMUCOSAL | Status: DC
  Administered 2019-05-21 – 2019-05-30 (×17): 15 mL via OROMUCOSAL

## 2019-05-21 NOTE — Progress Notes (Signed)
STROKE TEAM PROGRESS NOTE   INTERVAL HISTORY RN and ICU attending at bedside. Pt sitting in chair, says she is "good", eating, swallowing ok. Can transfer out of ICU when bed available.   Vitals:   05/21/19 1300 05/21/19 1400 05/21/19 1500 05/21/19 1600  BP: (!) 115/94 103/75 (!) 112/93 111/82  Pulse: 92 75 78 (!) 37  Resp: (!) 24 19 (!) 23 (!) 22  Temp:    (!) 97.2 F (36.2 C)  TempSrc:    Axillary  SpO2: 93% 95% 98% 97%  Weight:        CBC:  Recent Labs  Lab 05/19/19 1102  05/20/19 0442 05/21/19 0456  WBC 5.6  --  7.6 9.2  NEUTROABS 3.5  --  5.5  --   HGB 16.1*   < > 14.6 15.3*  HCT 48.1*   < > 43.6 45.9  MCV 85.7  --  83.4 84.4  PLT 208  --  139* 206   < > = values in this interval not displayed.    Basic Metabolic Panel:  Recent Labs  Lab 05/19/19 1700 05/20/19 0442 05/21/19 0456  NA  --  140 140  K  --  3.8 3.7  CL  --  110 112*  CO2  --  19* 16*  GLUCOSE  --  102* 112*  BUN  --  13 12  CREATININE  --  0.86 1.04*  CALCIUM  --  8.4* 8.3*  MG 1.7  --  1.7  PHOS  --   --  4.5   Lipid Panel:     Component Value Date/Time   CHOL 96 05/20/2019 0441   TRIG 90 05/20/2019 0441   HDL 15 (L) 05/20/2019 0441   CHOLHDL 6.4 05/20/2019 0441   VLDL 18 05/20/2019 0441   LDLCALC 63 05/20/2019 0441   HgbA1c:  Lab Results  Component Value Date   HGBA1C 7.2 (H) 05/20/2019   Urine Drug Screen:     Component Value Date/Time   LABOPIA NONE DETECTED 05/19/2019 1454   COCAINSCRNUR NONE DETECTED 05/19/2019 1454   LABBENZ NONE DETECTED 05/19/2019 1454   AMPHETMU NONE DETECTED 05/19/2019 1454   THCU NONE DETECTED 05/19/2019 1454   LABBARB NONE DETECTED 05/19/2019 1454    Alcohol Level     Component Value Date/Time   ETH <10 05/19/2019 1102    IMAGING  Mr Brain Wo Contrast Mr Angio Head Wo Contrast Mr Angio Neck W Wo Contrast 05/20/2019 IMPRESSION:   3.5 cm region of acute infarction in the right insula and temporal lobe. Few other scattered punctate  infarctions in the right MCA territory. No mass effect or hemorrhage.   Restoration of flow in the right MCA territory. No new intracranial occlusion.   Atherosclerotic plaque at both carotid bulb regions but without stenosis.   Tiny/thready right vertebral artery which could be congenital or due to acquired right vertebral disease.   The left vertebral is large vessel that appears widely patent and normal.   Ct Cerebral Perfusion W Contrast 05/19/2019 IMPRESSION:  Nondiagnostic CT perfusion.   Dg Chest Port 1 View 05/19/2019 IMPRESSION:  Interval placement of endotracheal tube suitably position above the carina. Cardiomegaly with chronic lung changes   Dg Abd Portable 1v 05/20/2019 IMPRESSION:  The OG tube has been advanced with the side port and distal tip both in the stomach.   Dg Abd Portable 1v 05/20/2019 IMPRESSION:  The oral gastric tube has been retracted slightly. The tip projects over the region of  the gastric body. The side hole projects over the GE junction.  Dg Abd Portable 1v 05/19/2019 IMPRESSION:  Orogastric tube in proximal stomach, side-port EG junction. Consolidation at the left lung base may represent developing pneumonitis.   Dg Hip Port Unilat With Pelvis 1v Right 05/19/2019 IMPRESSION:  No acute osseous injury of the right hip. Given the patient's age and osteopenia, if there is persistent clinical concern for an occult hip fracture, a CT or MRI of the hip is recommended for increased sensitivity.  DG Chest 1 View  05/19/19 IMPRESSION: Interval placement of endotracheal tube suitably position above the carina. Cardiomegaly with chronic lung changes  DG Chest 1 View - pending 05/21/19   Cerebral Angiogram 9/24/22020 S/P RT CCA arteriogram followed by complete revascularization of RT MCA M 1 occlusion with x 1 pass with 69mmx 40 mm Solitaire X retriever device achieving a TICI 3 revascularization.Marland Kitchen  PHYSICAL EXAM  Temp:  [97.2 F (36.2 C)-98 F  (36.7 C)] 97.2 F (36.2 C) (09/26 1600) Pulse Rate:  [37-102] 37 (09/26 1600) Resp:  [16-30] 22 (09/26 1600) BP: (103-126)/(64-100) 111/82 (09/26 1600) SpO2:  [90 %-99 %] 97 % (09/26 1600)  General - Well nourished, pleasant  Ophthalmologic - fundi not visualized due to small pupils.  Cardiovascular - Regular rate and rhythm.  Neuro - alert, sitting in chair, following commands, only oriented to name and says in hospital, pleasant, exophthalmos, left hemianopsia, eomi, face symmetric, tongue protrusion midline, ntigravity on the right, left side antigravity with drift, 1+ patellars and biceps, toes equiv, w/d to pain x 4. She can stand and transfer with help.    ASSESSMENT/PLAN [name] is a 63 y.o. female with history of HTN, HLD, CHF, pre-diabetes, morbid obesity, LBBB and GERD presenting to [facility] who woke with L sided weakness, L hemisensory deficit, speech difficulty and L facial droop. To [facility] for IR.   Stroke: R MCA infarct due to right M1 occlusion s/p IR with TICI3 reperfusion - embolic secondary to severe cardiomyopathy with low EF  CT head Oval Linsey) no hypodensity  CTA head & neck ([place]) R MCA occlusion   CT perfusion nondiagnostic  Cerebral angio R M1 occlusion w/ TICI3 revascularization    Post IR CT no ICH or gross mass effect   MRI - 3.5 cm region of acute infarction in the right insula and temporal lobe. Few other scattered punctate infarctions in the right MCA territory.  MRA H&N - Restoration of flow in the right MCA territory. No new intracranial occlusion. Atherosclerotic plaque at both carotid bulb regions but without stenosis. Tiny/thready right vertebral artery which could be congenital or due to acquired right vertebral disease. The left vertebral is large vessel that appears widely patent and normal.   CT Perfusion -  Non diagnostic  2D Echo EF < 20%  LE venous doppler - b/l LE edema - Preliminary report There is no evidence  of deep vein thrombosis in the lower extremity.   Repeat CXR - 9/26 - Evidence of early edema with cardiomegaly.  LDL 63  HgbA1c 7.2  SCDs for VTE prophylaxis. Added Lovenox 40 mg sq daily    aspirin 81 mg daily prior to admission, now on ASA. Consider anticoagulation due to low EF.  Therapy recommendations:  pending   Disposition:  pending   Chronic systolic Congestive heart failure HFrEF w/ NICM  EF < 20%  Home meds:  lasix 40, spironolactone 12.5, coreg 6.25  BNP 3910.8   B/l pedal edema  Gentle hydration, now on IVF @ 40  Cardiology consult -> called, appreciate cardiology  Will need anticoagulation for stroke prevention. Will decide timing of AC based on MRI and f/u imaging. Per Cards, Coumadin preferred.  Hypertension  Home meds:  Coreg 6.25 bid . BP goal 120-140 per IR x 24h post IR . Long-term BP goal normotensive  Hyperlipidemia  Home meds:  lipitor 10  LDL 63, goal < 70  Will resume lipitor once po access - resume Lipitor please  Continue statin at discharge  Diabetes type II Uncontrolled  Home meds:  none  HgbA1c 7.2, goal < 7.0  CBGs  SSI  Dysphagia . Secondary to stroke . NPO . Speech on board . Resume home meds    Other Stroke Risk Factors  Morbid Obesity, Body mass index is 37.44 kg/m., recommend weight loss, diet and exercise as appropriate   Other Active Problems  Exophthalmos, TSH normal  R hip xray neg for fx  Creatinine - 1.04 - post contrast   Hospital day # 2  Personally examined patient and images, and have participated in and made any corrections needed to history, physical, neuro exam,assessment and plan as stated above.  I have personally obtained the history, evaluated lab date, reviewed imaging studies and agree with radiology interpretations.    Naomie Dean, MD Stroke Neurology   A total of 35 minutes was spent for the care of this patient, spent on counseling patient and family on different  diagnostic and therapeutic options, counseling and coordination of care, riskd ans benefits of management, compliance, or risk factor reduction and education.     To contact Stroke Continuity provider, please refer to [email or web address]. After hours, contact General Neurology

## 2019-05-21 NOTE — Progress Notes (Signed)
Bilateral lower extremity duplex completed. Preliminary results in Chart review CV Proc. [facility], [place] 05/21/2019, 10:33 AM

## 2019-05-21 NOTE — Consult Note (Addendum)
Cardiology Consultation:   Patient ID: Breanna Bennett MRN: [id number]; DOB: 1956/06/14  Admit date: 05/19/2019 Date of Consult: 05/21/2019  Primary Care Provider: Marisue Humble, FNP Primary Cardiologist: Jenne Campus, MD  Primary Electrophysiologist:  None    Patient Profile:   Breanna Bennett is a 63 y.o. female with a hx of HTN, HLD, prediabetes and recent diagnosed of NICM/systolic CHF, moderate-severe mitral regurgitation, moderate-severe TR, admitted for Rt MCA CVA. Reduced LVEF felt to contribute to CVA. Cardiology consulted to weigh in regarding anticoagulation, at the request of Dr. Erlinda Hong.   History of Present Illness:   Breanna Bennett is a 63 y/o female with a h/o HTN, HLD, prediabetes and recent diagnosed of NICM/systolic CHF, moderate-severe mitral regurgitation, moderate-severe TR. She was recently admitted with several-week hx of abdominal distention, SOB, edema. 2D echo at outside hospital showed LVEF 25-30% with severely dilated LV, moderate-severe MR, moderate-severe TR. EKG with NSR with LBBB. She had a brief episode of possible atrial flutter noted on telemetry. R/LHC showed no significant CAD (per review with Dr. Angelena Form on 5/22), felt to represent NICM, LVEDP 28mHg. There was question of atrial flutter but Dr. HPercival Spanishreviewed available information and saw sinus tach only. She had mild renal insufficiency with Cr up to 1.4 (new, previously 0.9) which improved to 1.02 by DC. She has since been followed in clinic by Dr. KAgustin Cree Unable to afford Entresto. She tried Losartan but did not tolerate (complained of abdomina pain and bloating).  Subsequently, ARB was discontinued and she was placed on an ACEi, lisinopril 5 mg. Coreg and spironolactone continued. At last OV 9/9, she had edema and BNP elevated at 3,134. Pt was instructed to increase Lasix to 40 mg BID. She was to return in 7 days for repeat labs and 4 weeks for f/u.   [facility] [place]. Was not a candidate for t[place] based on time criteria. CTA of head and neck revealed Rt MCA occlusion, s/p IR with TICI3 reperfusion. CVA felt to be embolic secondary to severe cardiomyopathy w/ low EF. Cardiology consulted to weigh in regarding anticoagulation.   Heart Pathway Score:     Past Medical History:  Diagnosis Date   Chronic systolic CHF (congestive heart failure) (HCC)    Elevated hemoglobin (HCC)    GERD (gastroesophageal reflux disease)    Hyperlipidemia    Hypertension    LBBB (left bundle Lakira Ogando block)    Mild renal insufficiency    Mitral regurgitation    NICM (nonischemic cardiomyopathy) (HCC)    Pre-diabetes    Tricuspid regurgitation     Past Surgical History:  Procedure Laterality Date   RADIOLOGY WITH ANESTHESIA N/A 05/19/2019   Procedure: IR WITH ANESTHESIA;  Surgeon: DLuanne Bras MD;  [facility]  Service: Radiology;  Laterality: N/A;   RIGHT/LEFT HEART CATH AND CORONARY ANGIOGRAPHY N/A 01/13/2019   Procedure: RIGHT/LEFT HEART CATH AND CORONARY ANGIOGRAPHY;  Surgeon: BLorretta Harp MD;  [facility];  Service: Cardiovascular;  Laterality: N/A;   TUBAL LIGATION       Home Medications:  Prior to Admission medications   Medication Sig Start Date End Date Taking? Authorizing Provider  albuterol (VENTOLIN HFA) 108 (90 Base) MCG/ACT inhaler Inhale 2 puffs into the lungs every 4 (four) hours as needed for shortness of breath. 11/08/18  Yes [provider]  ASPIRIN 81 PO Take 81 mg by mouth daily.    Yes [provider]  atorvastatin (LIPITOR) 10 MG tablet Take 10 mg by mouth daily. 10/19/18  Yes [provider]  carvedilol (COREG) 6.25 MG tablet TAKE 1 TABLET(6.25 MG) BY MOUTH TWICE DAILY WITH A MEAL Patient taking differently: Take 6.25 mg by mouth 2 (two) times daily with a meal.  05/05/19  Yes Park Liter, MD  famotidine (PEPCID) 40 MG tablet Take 40 mg  by mouth at bedtime.  10/19/18  Yes [provider]  fluticasone (FLONASE) 50 MCG/ACT nasal spray Place 2 sprays into both nostrils daily as needed for allergies or rhinitis.  01/03/19  Yes [provider]  furosemide (LASIX) 40 MG tablet Take 1 tablet (40 mg total) by mouth 2 (two) times daily. Patient taking differently: Take 20 mg by mouth 2 (two) times daily.  05/16/19  Yes Park Liter, MD  loratadine (CLARITIN) 10 MG tablet Take 10 mg by mouth daily as needed for allergies or rhinitis.  11/08/18  Yes [provider]  montelukast (SINGULAIR) 10 MG tablet Take 10 mg by mouth at bedtime.   Yes [provider]  spironolactone (ALDACTONE) 25 MG tablet Take 0.5 tablets (12.5 mg total) by mouth daily. 03/24/19  Yes Dunn, Dayna N, PA-C  Vitamin D, Ergocalciferol, (DRISDOL) 1.25 MG (50000 UT) CAPS capsule Take 50,000 Units by mouth every Monday.  03/30/19  Yes [provider]    Inpatient Medications: Scheduled Meds:  aspirin  324 mg Per Tube Daily   Or   aspirin  300 mg Rectal Daily   chlorhexidine gluconate (MEDLINE KIT)  15 mL Mouth Rinse BID   Chlorhexidine Gluconate Cloth  6 each Topical Daily   enoxaparin (LOVENOX) injection  60 mg Subcutaneous Q24H   insulin aspart  0-20 Units Subcutaneous Q4H   mouth rinse  15 mL Mouth Rinse BID   pantoprazole sodium  40 mg Per Tube Daily   [START ON 05/23/2019] Vitamin D (Ergocalciferol)  50,000 Units Oral Weekly   Continuous Infusions:  sodium chloride 40 mL/hr at 05/20/19 1833   PRN Meds: acetaminophen **OR** acetaminophen (TYLENOL) oral liquid 160 mg/5 mL **OR** acetaminophen, senna-docusate  Allergies:    Allergies  Allergen Reactions   Lisinopril Cough   Losartan Cough    Social History:   Social History   Socioeconomic History   Marital status: Unknown    Spouse name: Not on file   Number of children: Not on file   Years of education: Not on file   Highest education  level: Not on file  Occupational History   Not on file  Social Needs   Financial resource strain: Not on file   Food insecurity    Worry: Not on file    Inability: Not on file   Transportation needs    Medical: Not on file    Non-medical: Not on file  Tobacco Use   Smoking status: Never Smoker   Smokeless tobacco: Never Used  Substance and Sexual Activity   Alcohol use: Never    Frequency: Never   Drug use: Never   Sexual activity: Not on file  Lifestyle   Physical activity    Days per week: Not on file    Minutes per session: Not on file   Stress: Not on file  Relationships   Social connections    Talks on phone: Not on file    Gets together: Not on file    Attends religious service: Not on file    Active member of club or organization:  Not on file    Attends meetings of clubs or organizations: Not on file    Relationship status: Not on file   Intimate partner violence    Fear of current or ex partner: Not on file    Emotionally abused: Not on file    Physically abused: Not on file    Forced sexual activity: Not on file  Other Topics Concern   Not on file  Social History Narrative   Not on file    Family History:    Family History  Problem Relation Age of Onset   CAD Mother    Heart failure Mother    Hypertension Mother    Colon cancer Father      ROS:  Please see the history of present illness.   All other ROS reviewed and negative.     Physical Exam/Data:   Vitals:   05/21/19 0500 05/21/19 0600 05/21/19 0700 05/21/19 0800  BP: (!) 122/91 119/85 122/90 115/83  Pulse: 97 94 95 86  Resp: 20 (!) 24 (!) 26 (!) 25  Temp:    97.6 F (36.4 C)  TempSrc:    Axillary  SpO2: 90% 92% 95% 98%  Weight:        Intake/Output Summary (Last 24 hours) at 05/21/2019 1144 Last data filed at 05/21/2019 0800 Gross per 24 hour  Intake 1389.91 ml  Output 900 ml  Net 489.91 ml   Last 3 Weights 05/19/2019 05/04/2019 03/25/2019  Weight (lbs) 246 lb  4.1 oz 242 lb 3.2 oz 240 lb  Weight (kg) 111.7 kg 109.861 kg 108.863 kg     Body mass index is 37.44 kg/m.  General:  Well nourished, well developed, in no acute distress HEENT: normal Lymph: no adenopathy Neck: no JVD Endocrine:  No thryomegaly Vascular: No carotid bruits; FA pulses 2+ bilaterally without bruits  Cardiac:  normal S1, S2; RRR; no murmur  Lungs:  clear to auscultation bilaterally, no wheezing, rhonchi or rales  Abd: soft, nontender, no hepatomegaly  Ext: no edema Musculoskeletal:  No deformities, BUE and BLE strength normal and equal Skin: warm and dry  Neuro:  CNs 2-12 intact, no focal abnormalities noted Psych:  Normal affect   EKG:  The EKG was personally reviewed and demonstrates:  Sinus rhythm with 1st degree A-V block with occasional Premature ventricular complexes and Fusion complexes Left bundle Cade Dashner block Telemetry:  Telemetry was personally reviewed and demonstrates:    Relevant CV Studies: 2D Echo 05/20/19 1. Left ventricular ejection fraction, by visual estimation, is <20%. The left ventricle has severely decreased function. Severely increased left ventricular size. There is no left ventricular hypertrophy. 2. Definity contrast agent was given IV to delineate the left ventricular endocardial borders. 3. Left ventricular diastolic Doppler parameters are indeterminate pattern of LV diastolic filling. 4. Global right ventricle has severely reduced systolic function.The right ventricular size is normal. No increase in right ventricular wall thickness. 5. Left atrial size was severely dilated. 6. Right atrial size was normal. 7. . Moderate mitral valve regurgitation. 8. Tricuspid valve regurgitation moderate. 9. Pulmonic valve regurgitation is moderate by color flow Doppler.  Laboratory Data:  High Sensitivity Troponin:   Recent Labs  Lab 05/19/19 1700 05/19/19 1822  TROPONINIHS 14 12     Chemistry Recent Labs  Lab 05/19/19 1102  05/19/19 1103 05/19/19 1623 05/20/19 0442 05/21/19 0456  NA 141 141 141 140 140  K 4.3 4.1 3.5 3.8 3.7  CL 105 104  --  110 112*  CO2 23  --   --  19* 16*  GLUCOSE 153* 146*  --  102* 112*  BUN 16 17  --  13 12  CREATININE 1.09* 0.90  --  0.86 1.04*  CALCIUM 9.0  --   --  8.4* 8.3*  GFRNONAA 54*  --   --  >60 57*  GFRAA >60  --   --  >60 >60  ANIONGAP 13  --   --  11 12    Recent Labs  Lab 05/19/19 1102  PROT 6.1*  ALBUMIN 3.1*  AST 27  ALT 21  ALKPHOS 119  BILITOT 1.8*   Hematology Recent Labs  Lab 05/19/19 1102  05/19/19 1623 05/20/19 0442 05/21/19 0456  WBC 5.6  --   --  7.6 9.2  RBC 5.61*  --   --  5.23* 5.44*  HGB 16.1*   < > 15.3* 14.6 15.3*  HCT 48.1*   < > 45.0 43.6 45.9  MCV 85.7  --   --  83.4 84.4  MCH 28.7  --   --  27.9 28.1  MCHC 33.5  --   --  33.5 33.3  RDW 16.7*  --   --  16.0* 17.4*  PLT 208  --   --  139* 206   < > = values in this interval not displayed.   BNP Recent Labs  Lab 05/19/19 1655 05/21/19 0456  BNP 3,910.8* 0,321.2*    DDimer No results for input(s): DDIMER in the last 168 hours.   Radiology/Studies:  Mr Angio Head Wo Contrast  Result Date: 05/20/2019 CLINICAL DATA:  Right MCA stroke due to right M1 occlusion. Status post intervention. EXAM: MRI HEAD WITHOUT  CONTRAST MRA HEAD WITHOUT CONTRAST MRA NECK WITHOUT AND WITH CONTRAST TECHNIQUE: Multiplanar, multiecho pulse sequences of the brain and surrounding structures were obtained without and with intravenous contrast. Angiographic images of the Circle of Willis were obtained using MRA technique without intravenous contrast. Angiographic images of the neck were obtained using MRA technique without and with intravenous contrast. Carotid stenosis measurements (when applicable) are obtained utilizing NASCET criteria, using the distal internal carotid diameter as the denominator. CONTRAST:  14m GADAVIST GADOBUTROL 1 MMOL/ML IV SOLN COMPARISON:  CT studies yesterday. FINDINGS: MRI  HEAD FINDINGS Brain: Diffusion imaging shows a region of infarction measuring about 3.5 mm affecting the insular region and temporal lobe on the right. Mild swelling but no mass effect or hemorrhage. Few other scattered punctate acute infarctions in the right basal ganglia, right frontoparietal white matter and along the surfaces of the right frontoparietal brain. No mass effect or shift. There is old infarction in the right occipital lobe with atrophy and gliosis. No hydrocephalus. No extra-axial collection. No sign of blood products. Vascular: Major vessels at the base of the brain show flow. Skull and upper cervical spine: Negative Sinuses/Orbits: Clear/normal Other: None MRA HEAD FINDINGS Both internal carotid arteries are patent through the skull base and siphon regions. The anterior and middle cerebral vessels show flow presently. Restoration of flow in the right MCA territory. Right A1 segment is diminutive. Right vertebral artery terminates in PICA. Left vertebral artery supplies the basilar. No basilar stenosis or occlusion. Posterior circulation Ronella Plunk vessels show flow. MRA NECK FINDINGS Both common carotid arteries are widely patent to the bifurcation. Both carotid bifurcations are widely patent without stenosis at the origin or in either ICA bulb. Some atherosclerotic plaque in both bulb regions but no stenosis using NASCET criteria. Dominant left vertebral artery  widely patent at the origin and through the cervical region to the basilar. Very diminutive right vertebral artery, either due to congenital small size or acquired disease. IMPRESSION: 3.5 cm region of acute infarction in the right insula and temporal lobe. Few other scattered punctate infarctions in the right MCA territory. No mass effect or hemorrhage. Restoration of flow in the right MCA territory. No new intracranial occlusion. Atherosclerotic plaque at both carotid bulb regions but without stenosis. Tiny/thready right vertebral artery  which could be congenital or due to acquired right vertebral disease. The left vertebral is large vessel that appears widely patent and normal. Electronically Signed   By: Nelson Chimes M.D.   On: 05/20/2019 17:09   Mr Angio Neck W Wo Contrast  Result Date: 05/20/2019 CLINICAL DATA:  Right MCA stroke due to right M1 occlusion. Status post intervention. EXAM: MRI HEAD WITHOUT  CONTRAST MRA HEAD WITHOUT CONTRAST MRA NECK WITHOUT AND WITH CONTRAST TECHNIQUE: Multiplanar, multiecho pulse sequences of the brain and surrounding structures were obtained without and with intravenous contrast. Angiographic images of the Circle of Willis were obtained using MRA technique without intravenous contrast. Angiographic images of the neck were obtained using MRA technique without and with intravenous contrast. Carotid stenosis measurements (when applicable) are obtained utilizing NASCET criteria, using the distal internal carotid diameter as the denominator. CONTRAST:  67m GADAVIST GADOBUTROL 1 MMOL/ML IV SOLN COMPARISON:  CT studies yesterday. FINDINGS: MRI HEAD FINDINGS Brain: Diffusion imaging shows a region of infarction measuring about 3.5 mm affecting the insular region and temporal lobe on the right. Mild swelling but no mass effect or hemorrhage. Few other scattered punctate acute infarctions in the right basal ganglia, right frontoparietal white matter and along the surfaces of the right frontoparietal brain. No mass effect or shift. There is old infarction in the right occipital lobe with atrophy and gliosis. No hydrocephalus. No extra-axial collection. No sign of blood products. Vascular: Major vessels at the base of the brain show flow. Skull and upper cervical spine: Negative Sinuses/Orbits: Clear/normal Other: None MRA HEAD FINDINGS Both internal carotid arteries are patent through the skull base and siphon regions. The anterior and middle cerebral vessels show flow presently. Restoration of flow in the right MCA  territory. Right A1 segment is diminutive. Right vertebral artery terminates in PICA. Left vertebral artery supplies the basilar. No basilar stenosis or occlusion. Posterior circulation Matti Minney vessels show flow. MRA NECK FINDINGS Both common carotid arteries are widely patent to the bifurcation. Both carotid bifurcations are widely patent without stenosis at the origin or in either ICA bulb. Some atherosclerotic plaque in both bulb regions but no stenosis using NASCET criteria. Dominant left vertebral artery widely patent at the origin and through the cervical region to the basilar. Very diminutive right vertebral artery, either due to congenital small size or acquired disease. IMPRESSION: 3.5 cm region of acute infarction in the right insula and temporal lobe. Few other scattered punctate infarctions in the right MCA territory. No mass effect or hemorrhage. Restoration of flow in the right MCA territory. No new intracranial occlusion. Atherosclerotic plaque at both carotid bulb regions but without stenosis. Tiny/thready right vertebral artery which could be congenital or due to acquired right vertebral disease. The left vertebral is large vessel that appears widely patent and normal. Electronically Signed   By: MNelson ChimesM.D.   On: 05/20/2019 17:09   Mr Brain Wo Contrast  Result Date: 05/20/2019 CLINICAL DATA:  Right MCA stroke due to right M1 occlusion. Status post  intervention. EXAM: MRI HEAD WITHOUT  CONTRAST MRA HEAD WITHOUT CONTRAST MRA NECK WITHOUT AND WITH CONTRAST TECHNIQUE: Multiplanar, multiecho pulse sequences of the brain and surrounding structures were obtained without and with intravenous contrast. Angiographic images of the Circle of Willis were obtained using MRA technique without intravenous contrast. Angiographic images of the neck were obtained using MRA technique without and with intravenous contrast. Carotid stenosis measurements (when applicable) are obtained utilizing NASCET criteria,  using the distal internal carotid diameter as the denominator. CONTRAST:  48m GADAVIST GADOBUTROL 1 MMOL/ML IV SOLN COMPARISON:  CT studies yesterday. FINDINGS: MRI HEAD FINDINGS Brain: Diffusion imaging shows a region of infarction measuring about 3.5 mm affecting the insular region and temporal lobe on the right. Mild swelling but no mass effect or hemorrhage. Few other scattered punctate acute infarctions in the right basal ganglia, right frontoparietal white matter and along the surfaces of the right frontoparietal brain. No mass effect or shift. There is old infarction in the right occipital lobe with atrophy and gliosis. No hydrocephalus. No extra-axial collection. No sign of blood products. Vascular: Major vessels at the base of the brain show flow. Skull and upper cervical spine: Negative Sinuses/Orbits: Clear/normal Other: None MRA HEAD FINDINGS Both internal carotid arteries are patent through the skull base and siphon regions. The anterior and middle cerebral vessels show flow presently. Restoration of flow in the right MCA territory. Right A1 segment is diminutive. Right vertebral artery terminates in PICA. Left vertebral artery supplies the basilar. No basilar stenosis or occlusion. Posterior circulation Jeanie Mccard vessels show flow. MRA NECK FINDINGS Both common carotid arteries are widely patent to the bifurcation. Both carotid bifurcations are widely patent without stenosis at the origin or in either ICA bulb. Some atherosclerotic plaque in both bulb regions but no stenosis using NASCET criteria. Dominant left vertebral artery widely patent at the origin and through the cervical region to the basilar. Very diminutive right vertebral artery, either due to congenital small size or acquired disease. IMPRESSION: 3.5 cm region of acute infarction in the right insula and temporal lobe. Few other scattered punctate infarctions in the right MCA territory. No mass effect or hemorrhage. Restoration of flow in the  right MCA territory. No new intracranial occlusion. Atherosclerotic plaque at both carotid bulb regions but without stenosis. Tiny/thready right vertebral artery which could be congenital or due to acquired right vertebral disease. The left vertebral is large vessel that appears widely patent and normal. Electronically Signed   By: MNelson ChimesM.D.   On: 05/20/2019 17:09   Ct Cerebral Perfusion W Contrast  Result Date: 05/19/2019 CLINICAL DATA:  Stroke.  Right MCA occlusion EXAM: CT PERFUSION BRAIN TECHNIQUE: Multiphase CT imaging of the brain was performed following IV bolus contrast injection. Subsequent parametric perfusion maps were calculated using RAPID software. CONTRAST:  437mOMNIPAQUE IOHEXOL 350 MG/ML SOLN COMPARISON:  CTA earlier today. FINDINGS: Nondiagnostic CT perfusion. Two attempts were made with separate contrast injections. Both attempts show inadequate vascular enhancement for perfusion analysis. See prior CTA and CT head results IMPRESSION: Nondiagnostic CT perfusion. These results were called by telephone at the time of interpretation on 05/19/2019 at 12:10 pm to provider ERIC LIVa [facility] who verbally acknowledged these results. Electronically Signed   By: ChFranchot Gallo.D.   On: 05/19/2019 12:24   Dg Chest Port 1 View  Result Date: 05/21/2019 CLINICAL DATA:  [age]ear old female with a history of respiratory failure EXAM: PORTABLE CHEST 1 VIEW COMPARISON:  May 20, 2019 FINDINGS: Cardiomegaly again demonstrated.  No pneumothorax. Blunting of the bilateral costophrenic angles. Coarsened interstitial markings throughout with thickening of the minor fissure. Interlobular septal thickening. Interval removal of the endotracheal tube. No displaced fracture IMPRESSION: Evidence of early edema with cardiomegaly. Likely trace pleural effusions. Interval removal of the endotracheal tube Electronically Signed   By: Corrie Mckusick D.O.   On: 05/21/2019 09:26   Dg Chest Port 1 View  Result  Date: 05/19/2019 CLINICAL DATA:  63 year old female with a history of stroke EXAM: PORTABLE CHEST 1 VIEW COMPARISON:  May 19, 2019 FINDINGS: Cardiomediastinal silhouette unchanged in size and contour with cardiomegaly. Interval placement of endotracheal tube which terminates suitably above the carina approximately 3.2 cm. No pneumothorax. No pleural effusion. No new confluent airspace disease. Coarsened interstitial markings bilaterally with fullness in the central vasculature. IMPRESSION: Interval placement of endotracheal tube suitably position above the carina. Cardiomegaly with chronic lung changes Electronically Signed   By: Corrie Mckusick D.O.   On: 05/19/2019 15:44   Dg Abd Portable 1v  Result Date: 05/20/2019 CLINICAL DATA:  Evaluate OG tube EXAM: PORTABLE ABDOMEN - 1 VIEW COMPARISON:  May 20, 2019 FINDINGS: The ETT is in good position. The OG tube terminates in the stomach. The ETT is in good position. IMPRESSION: The OG tube has been advanced with the side port and distal tip both in the stomach. Electronically Signed   By: Dorise Bullion III M.D   On: 05/20/2019 13:34   Dg Abd Portable 1v  Result Date: 05/20/2019 CLINICAL DATA:  Orogastric tube placement EXAM: PORTABLE ABDOMEN - 1 VIEW COMPARISON:  05/20/2019 FINDINGS: The OG tube projects over the region of the gastric body, but has been retracted slightly since the prior study. The visualized bowel gas pattern is nonspecific and nonobstructive. The heart size is enlarged. IMPRESSION: The oral gastric tube has been retracted slightly. The tip projects over the region of the gastric body. The side hole projects over the GE junction. Electronically Signed   By: Constance Holster M.D.   On: 05/20/2019 12:08   Dg Abd Portable 1v  Result Date: 05/19/2019 CLINICAL DATA:  OG tube placement. EXAM: PORTABLE ABDOMEN - 1 VIEW COMPARISON:  01/09/2019 FINDINGS: Dense retrocardiac opacification. Cardiomegaly similar to prior study. Orogastric  tube passes into the proximal stomach, side port at the GE junction. Bowel gas pattern unremarkable though incompletely imaged. IMPRESSION: Orogastric tube in proximal stomach, side-port EG junction. Consolidation at the left lung base may represent developing pneumonitis. Electronically Signed   By: Zetta Bills M.D.   On: 05/19/2019 17:10   Dg Hip Port Unilat With Pelvis 1v Right  Result Date: 05/19/2019 CLINICAL DATA:  Status post fall, pain EXAM: DG HIP (WITH OR WITHOUT PELVIS) 1V PORT RIGHT COMPARISON:  None. FINDINGS: Limited examination secondary to technique and osteopenia. No acute fracture or dislocation. No aggressive osseous lesion. IMPRESSION: No acute osseous injury of the right hip. Given the patient's age and osteopenia, if there is persistent clinical concern for an occult hip fracture, a CT or MRI of the hip is recommended for increased sensitivity. Electronically Signed   By: Kathreen Devoid   On: 05/19/2019 15:44   Vas [place] Lower Extremity Venous (dvt)  Result Date: 05/21/2019  Lower Venous Study Indications: Edema.  Limitations: Body habitus and movement. Comparison Study: No previous study available  Performing Technologist: Toma Copier RVS  Examination Guidelines: A complete evaluation includes B-mode imaging, spectral Doppler, color Doppler, and power Doppler as needed of all accessible portions of each vessel. Bilateral  testing is considered an integral part of a complete examination. Limited examinations for reoccurring indications may be performed as noted.  +---------+---------------+---------+-----------+----------+-------------------+  RIGHT     Compressibility Phasicity Spontaneity Properties Thrombus Aging       +---------+---------------+---------+-----------+----------+-------------------+  CFV       Full            Yes       Yes                                         +---------+---------------+---------+-----------+----------+-------------------+  SFJ       Full                                                                   +---------+---------------+---------+-----------+----------+-------------------+  FV Prox   Full            Yes       Yes                                         +---------+---------------+---------+-----------+----------+-------------------+  FV Mid    Full                                                                  +---------+---------------+---------+-----------+----------+-------------------+  FV Distal Full            Yes       Yes                                         +---------+---------------+---------+-----------+----------+-------------------+  PFV       Full            Yes       Yes                                         +---------+---------------+---------+-----------+----------+-------------------+  POP       Full                                                                  +---------+---------------+---------+-----------+----------+-------------------+  PTV       Full                                                                  +---------+---------------+---------+-----------+----------+-------------------+  PERO                                                       Unable to compress                                                               due to constant                                                                  movement on                                                                      attempts. Color                                                                  flow appears normal                                                              of augmentation      +---------+---------------+---------+-----------+----------+-------------------+   +---------+---------------+---------+-----------+----------+-------------------+  LEFT      Compressibility Phasicity Spontaneity Properties Thrombus Aging       +---------+---------------+---------+-----------+----------+-------------------+  CFV       Full             Yes       Yes                                         +---------+---------------+---------+-----------+----------+-------------------+  SFJ       Full                                                                  +---------+---------------+---------+-----------+----------+-------------------+  FV Prox   Full            Yes       Yes                                         +---------+---------------+---------+-----------+----------+-------------------+  FV Mid    Full                                                                  +---------+---------------+---------+-----------+----------+-------------------+  FV Distal Full            Yes       Yes                                         +---------+---------------+---------+-----------+----------+-------------------+  PFV       Full            Yes       Yes                                         +---------+---------------+---------+-----------+----------+-------------------+  POP       Full            Yes       Yes                                         +---------+---------------+---------+-----------+----------+-------------------+  PTV       Full                                                                  +---------+---------------+---------+-----------+----------+-------------------+  PERO                                                       Difficult to                                                                     perform                                                                          compressions due to  movement Color flow                                                              appears normal on                                                                augmentation         +---------+---------------+---------+-----------+----------+-------------------+    Summary: Right: There is no evidence of deep vein thrombosis in the lower extremity. However, portions of  this examination were limited- see technologist comments above. No cystic structure found in the popliteal fossa. Left: There is no evidence of deep vein thrombosis in the lower extremity. However, portions of this examination were limited- see technologist comments above. No cystic structure found in the popliteal fossa.  *See table(s) above for measurements and observations.    Preliminary     Assessment and Plan:   Lileigh Fahringer is a 63 y.o. female with a hx of HTN, HLD, prediabetes and recent diagnosed of NICM/systolic CHF, moderate-severe mitral regurgitation, moderate-severe TR, admitted for Rt MCA CVA. Reduced LVEF felt to contribute to CVA. Cardiology consulted to weigh in regarding anticoagulation, at the request of Dr. Erlinda Hong.   MD to evaluate. Recs pending.   For questions or updates, please contact [place] Please consult [email or web address] for contact info under     Signed, Lyda Jester, PA-C  05/21/2019 11:44 AM    Attending note Patient seen and discussed with PA Rosita Fire, I agree with her documentation above. 63 yo female history of NICM admitted with CVA.  Assessment and plan as reported below.   1. CVA - not a candidate for tpa - admitted with acute right MCA occlusion, s/p arterial thrombectomy. From - cardiology is consulted for considerations for anticoagulation for stroke prevention in setting of low LVEF. - echo reviewed, definitiy was used. No clear intracardiiac thrombus but certainly stasis of contrast and blood flow at the apex raising concern for possible intracardiac source. If no other clear source for CVA from neuro eval I think given this finding there is a high suspicion for intracardiac etiology. Coumadin would actually be be preferred anticoagulant for presumed LV thrombus based on recent study findings vs NOACs. I don't think a TEE would change management.  - defer timing of initiation of anticoag to neurology    2. Chronic systolic HF/NICM -  newly diagnosed in 12/2018. Echo from [place] not in epic at that time showed LVEF 25-30% - 12/2018 [facility]: no significant CAD, CI 1.7, mean PA 26, PCWP 17, LVEDP 32 - 04/2019 echo LVEF <20%, severe RV dysfunction, severe LAE - resume home regimen when she is taking oral. Mild fluid overload, I think getting back on her oral lasix will be sufficent.   3. Valvular heart diseaes - 04/2019 echo mod MR, moderate TR  Carlyle Dolly MD

## 2019-05-21 NOTE — Progress Notes (Signed)
Referring Physician(s): Code Stroke- Kerney Elbe  Supervising Physician: Luanne Bras  Patient Status:  [facility] - In-pt  Chief Complaint: None  Subjective:  Right MCA M1 segment occlusion s/p emergent mechanical thrombectomy achieving a TICI 3 revascularization 05/19/2019 by Dr. Estanislado Pandy. Patient awake and alert sitting in chair with no complaints at this time. Looks significantly better than yesterday, moving all 4s and following commands. Right groin incision c/d/i.  MRI brain/MRA head and neck 05/20/2019: 1. 3.5 cm region of acute infarction in the right insula and temporal lobe. Few other scattered punctate infarctions in the right MCA territory. No mass effect or hemorrhage. 2. Restoration of flow in the right MCA territory. No new intracranial occlusion. 3. Atherosclerotic plaque at both carotid bulb regions but without stenosis. 4. Tiny/thready right vertebral artery which could be congenital or due to acquired right vertebral disease. The left vertebral is large vessel that appears widely patent and normal.   Allergies: Lisinopril and Losartan  Medications: Prior to Admission medications   Medication Sig Start Date End Date Taking? Authorizing Provider  albuterol (VENTOLIN HFA) 108 (90 Base) MCG/ACT inhaler Inhale 2 puffs into the lungs every 4 (four) hours as needed for shortness of breath. 11/08/18  Yes [provider]  ASPIRIN 81 PO Take 81 mg by mouth daily.    Yes [provider]  atorvastatin (LIPITOR) 10 MG tablet Take 10 mg by mouth daily. 10/19/18  Yes [provider]  carvedilol (COREG) 6.25 MG tablet TAKE 1 TABLET(6.25 MG) BY MOUTH TWICE DAILY WITH A MEAL Patient taking differently: Take 6.25 mg by mouth 2 (two) times daily with a meal.  05/05/19  Yes Park Liter, MD  famotidine (PEPCID) 40 MG tablet Take 40 mg by mouth at bedtime.  10/19/18  Yes [provider]  fluticasone (FLONASE) 50 MCG/ACT nasal spray Place 2  sprays into both nostrils daily as needed for allergies or rhinitis.  01/03/19  Yes [provider]  furosemide (LASIX) 40 MG tablet Take 1 tablet (40 mg total) by mouth 2 (two) times daily. Patient taking differently: Take 20 mg by mouth 2 (two) times daily.  05/16/19  Yes Park Liter, MD  loratadine (CLARITIN) 10 MG tablet Take 10 mg by mouth daily as needed for allergies or rhinitis.  11/08/18  Yes [provider]  montelukast (SINGULAIR) 10 MG tablet Take 10 mg by mouth at bedtime.   Yes [provider]  spironolactone (ALDACTONE) 25 MG tablet Take 0.5 tablets (12.5 mg total) by mouth daily. 03/24/19  Yes Dunn, Dayna N, PA-C  Vitamin D, Ergocalciferol, (DRISDOL) 1.25 MG (50000 UT) CAPS capsule Take 50,000 Units by mouth every Monday.  03/30/19  Yes [provider]     Vital Signs: BP 115/83 (BP Location: Left Arm)    Pulse 86    Temp 97.6 F (36.4 C) (Axillary)    Resp (!) 25    Wt 246 lb 4.1 oz (111.7 kg)    SpO2 98%    BMI 37.44 kg/m   Physical Exam Vitals signs and nursing note reviewed.  Constitutional:      General: She is not in acute distress.    Appearance: Normal appearance.  Pulmonary:     Effort: Pulmonary effort is normal. No respiratory distress.  Skin:    General: Skin is warm and dry.     Comments: Right groin incision soft without active bleeding or hematoma.  Neurological:     Mental Status: She is alert.  Comments: Alert, awake, and oriented x3. Speech and comprehension intact. PERRL bilaterally. No facial asymmetry. Tongue midline. Can spontaneously move all extremities with some weakness of left side (can lift left extremities off bed). No pronator drift. Distal pulses 1+ bilaterally.  Psychiatric:        Mood and Affect: Mood normal.        Behavior: Behavior normal.        Thought Content: Thought content normal.        Judgment: Judgment normal.     Imaging: Mr Angio Head Wo Contrast  Result Date:  05/20/2019 CLINICAL DATA:  Right MCA stroke due to right M1 occlusion. Status post intervention. EXAM: MRI HEAD WITHOUT  CONTRAST MRA HEAD WITHOUT CONTRAST MRA NECK WITHOUT AND WITH CONTRAST TECHNIQUE: Multiplanar, multiecho pulse sequences of the brain and surrounding structures were obtained without and with intravenous contrast. Angiographic images of the Circle of Willis were obtained using MRA technique without intravenous contrast. Angiographic images of the neck were obtained using MRA technique without and with intravenous contrast. Carotid stenosis measurements (when applicable) are obtained utilizing NASCET criteria, using the distal internal carotid diameter as the denominator. CONTRAST:  70mL GADAVIST GADOBUTROL 1 MMOL/ML IV SOLN COMPARISON:  CT studies yesterday. FINDINGS: MRI HEAD FINDINGS Brain: Diffusion imaging shows a region of infarction measuring about 3.5 mm affecting the insular region and temporal lobe on the right. Mild swelling but no mass effect or hemorrhage. Few other scattered punctate acute infarctions in the right basal ganglia, right frontoparietal white matter and along the surfaces of the right frontoparietal brain. No mass effect or shift. There is old infarction in the right occipital lobe with atrophy and gliosis. No hydrocephalus. No extra-axial collection. No sign of blood products. Vascular: Major vessels at the base of the brain show flow. Skull and upper cervical spine: Negative Sinuses/Orbits: Clear/normal Other: None MRA HEAD FINDINGS Both internal carotid arteries are patent through the skull base and siphon regions. The anterior and middle cerebral vessels show flow presently. Restoration of flow in the right MCA territory. Right A1 segment is diminutive. Right vertebral artery terminates in PICA. Left vertebral artery supplies the basilar. No basilar stenosis or occlusion. Posterior circulation branch vessels show flow. MRA NECK FINDINGS Both common carotid arteries are  widely patent to the bifurcation. Both carotid bifurcations are widely patent without stenosis at the origin or in either ICA bulb. Some atherosclerotic plaque in both bulb regions but no stenosis using NASCET criteria. Dominant left vertebral artery widely patent at the origin and through the cervical region to the basilar. Very diminutive right vertebral artery, either due to congenital small size or acquired disease. IMPRESSION: 3.5 cm region of acute infarction in the right insula and temporal lobe. Few other scattered punctate infarctions in the right MCA territory. No mass effect or hemorrhage. Restoration of flow in the right MCA territory. No new intracranial occlusion. Atherosclerotic plaque at both carotid bulb regions but without stenosis. Tiny/thready right vertebral artery which could be congenital or due to acquired right vertebral disease. The left vertebral is large vessel that appears widely patent and normal. Electronically Signed   By: Paulina Fusi M.D.   On: 05/20/2019 17:09   Mr Angio Neck W Wo Contrast  Result Date: 05/20/2019 CLINICAL DATA:  Right MCA stroke due to right M1 occlusion. Status post intervention. EXAM: MRI HEAD WITHOUT  CONTRAST MRA HEAD WITHOUT CONTRAST MRA NECK WITHOUT AND WITH CONTRAST TECHNIQUE: Multiplanar, multiecho pulse sequences of the brain and surrounding structures  were obtained without and with intravenous contrast. Angiographic images of the Circle of Willis were obtained using MRA technique without intravenous contrast. Angiographic images of the neck were obtained using MRA technique without and with intravenous contrast. Carotid stenosis measurements (when applicable) are obtained utilizing NASCET criteria, using the distal internal carotid diameter as the denominator. CONTRAST:  10mL GADAVIST GADOBUTROL 1 MMOL/ML IV SOLN COMPARISON:  CT studies yesterday. FINDINGS: MRI HEAD FINDINGS Brain: Diffusion imaging shows a region of infarction measuring about 3.5 mm  affecting the insular region and temporal lobe on the right. Mild swelling but no mass effect or hemorrhage. Few other scattered punctate acute infarctions in the right basal ganglia, right frontoparietal white matter and along the surfaces of the right frontoparietal brain. No mass effect or shift. There is old infarction in the right occipital lobe with atrophy and gliosis. No hydrocephalus. No extra-axial collection. No sign of blood products. Vascular: Major vessels at the base of the brain show flow. Skull and upper cervical spine: Negative Sinuses/Orbits: Clear/normal Other: None MRA HEAD FINDINGS Both internal carotid arteries are patent through the skull base and siphon regions. The anterior and middle cerebral vessels show flow presently. Restoration of flow in the right MCA territory. Right A1 segment is diminutive. Right vertebral artery terminates in PICA. Left vertebral artery supplies the basilar. No basilar stenosis or occlusion. Posterior circulation branch vessels show flow. MRA NECK FINDINGS Both common carotid arteries are widely patent to the bifurcation. Both carotid bifurcations are widely patent without stenosis at the origin or in either ICA bulb. Some atherosclerotic plaque in both bulb regions but no stenosis using NASCET criteria. Dominant left vertebral artery widely patent at the origin and through the cervical region to the basilar. Very diminutive right vertebral artery, either due to congenital small size or acquired disease. IMPRESSION: 3.5 cm region of acute infarction in the right insula and temporal lobe. Few other scattered punctate infarctions in the right MCA territory. No mass effect or hemorrhage. Restoration of flow in the right MCA territory. No new intracranial occlusion. Atherosclerotic plaque at both carotid bulb regions but without stenosis. Tiny/thready right vertebral artery which could be congenital or due to acquired right vertebral disease. The left vertebral is  large vessel that appears widely patent and normal. Electronically Signed   By: Paulina FusiMark  Shogry M.D.   On: 05/20/2019 17:09   Mr Brain Wo Contrast  Result Date: 05/20/2019 CLINICAL DATA:  Right MCA stroke due to right M1 occlusion. Status post intervention. EXAM: MRI HEAD WITHOUT  CONTRAST MRA HEAD WITHOUT CONTRAST MRA NECK WITHOUT AND WITH CONTRAST TECHNIQUE: Multiplanar, multiecho pulse sequences of the brain and surrounding structures were obtained without and with intravenous contrast. Angiographic images of the Circle of Willis were obtained using MRA technique without intravenous contrast. Angiographic images of the neck were obtained using MRA technique without and with intravenous contrast. Carotid stenosis measurements (when applicable) are obtained utilizing NASCET criteria, using the distal internal carotid diameter as the denominator. CONTRAST:  10mL GADAVIST GADOBUTROL 1 MMOL/ML IV SOLN COMPARISON:  CT studies yesterday. FINDINGS: MRI HEAD FINDINGS Brain: Diffusion imaging shows a region of infarction measuring about 3.5 mm affecting the insular region and temporal lobe on the right. Mild swelling but no mass effect or hemorrhage. Few other scattered punctate acute infarctions in the right basal ganglia, right frontoparietal white matter and along the surfaces of the right frontoparietal brain. No mass effect or shift. There is old infarction in the right occipital lobe with atrophy  and gliosis. No hydrocephalus. No extra-axial collection. No sign of blood products. Vascular: Major vessels at the base of the brain show flow. Skull and upper cervical spine: Negative Sinuses/Orbits: Clear/normal Other: None MRA HEAD FINDINGS Both internal carotid arteries are patent through the skull base and siphon regions. The anterior and middle cerebral vessels show flow presently. Restoration of flow in the right MCA territory. Right A1 segment is diminutive. Right vertebral artery terminates in PICA. Left vertebral  artery supplies the basilar. No basilar stenosis or occlusion. Posterior circulation branch vessels show flow. MRA NECK FINDINGS Both common carotid arteries are widely patent to the bifurcation. Both carotid bifurcations are widely patent without stenosis at the origin or in either ICA bulb. Some atherosclerotic plaque in both bulb regions but no stenosis using NASCET criteria. Dominant left vertebral artery widely patent at the origin and through the cervical region to the basilar. Very diminutive right vertebral artery, either due to congenital small size or acquired disease. IMPRESSION: 3.5 cm region of acute infarction in the right insula and temporal lobe. Few other scattered punctate infarctions in the right MCA territory. No mass effect or hemorrhage. Restoration of flow in the right MCA territory. No new intracranial occlusion. Atherosclerotic plaque at both carotid bulb regions but without stenosis. Tiny/thready right vertebral artery which could be congenital or due to acquired right vertebral disease. The left vertebral is large vessel that appears widely patent and normal. Electronically Signed   By: Paulina Fusi M.D.   On: 05/20/2019 17:09   Ct Cerebral Perfusion W Contrast  Result Date: 05/19/2019 CLINICAL DATA:  Stroke.  Right MCA occlusion EXAM: CT PERFUSION BRAIN TECHNIQUE: Multiphase CT imaging of the brain was performed following IV bolus contrast injection. Subsequent parametric perfusion maps were calculated using RAPID software. CONTRAST:  40mL OMNIPAQUE IOHEXOL 350 MG/ML SOLN COMPARISON:  CTA earlier today. FINDINGS: Nondiagnostic CT perfusion. Two attempts were made with separate contrast injections. Both attempts show inadequate vascular enhancement for perfusion analysis. See prior CTA and CT head results IMPRESSION: Nondiagnostic CT perfusion. These results were called by telephone at the time of interpretation on 05/19/2019 at 12:10 pm to provider ERIC [facility] , who verbally  acknowledged these results. Electronically Signed   By: Marlan Palau M.D.   On: 05/19/2019 12:24   Dg Chest Port 1 View  Result Date: 05/21/2019 CLINICAL DATA:  63 year old female with a history of respiratory failure EXAM: PORTABLE CHEST 1 VIEW COMPARISON:  May 20, 2019 FINDINGS: Cardiomegaly again demonstrated. No pneumothorax. Blunting of the bilateral costophrenic angles. Coarsened interstitial markings throughout with thickening of the minor fissure. Interlobular septal thickening. Interval removal of the endotracheal tube. No displaced fracture IMPRESSION: Evidence of early edema with cardiomegaly. Likely trace pleural effusions. Interval removal of the endotracheal tube Electronically Signed   By: Gilmer Mor D.O.   On: 05/21/2019 09:26   Dg Chest Port 1 View  Result Date: 05/19/2019 CLINICAL DATA:  63 year old female with a history of stroke EXAM: PORTABLE CHEST 1 VIEW COMPARISON:  May 19, 2019 FINDINGS: Cardiomediastinal silhouette unchanged in size and contour with cardiomegaly. Interval placement of endotracheal tube which terminates suitably above the carina approximately 3.2 cm. No pneumothorax. No pleural effusion. No new confluent airspace disease. Coarsened interstitial markings bilaterally with fullness in the central vasculature. IMPRESSION: Interval placement of endotracheal tube suitably position above the carina. Cardiomegaly with chronic lung changes Electronically Signed   By: Gilmer Mor D.O.   On: 05/19/2019 15:44   Dg Abd Portable 1v  Result Date: 05/20/2019 CLINICAL DATA:  Evaluate OG tube EXAM: PORTABLE ABDOMEN - 1 VIEW COMPARISON:  May 20, 2019 FINDINGS: The ETT is in good position. The OG tube terminates in the stomach. The ETT is in good position. IMPRESSION: The OG tube has been advanced with the side port and distal tip both in the stomach. Electronically Signed   By: Gerome Samavid  Williams III M.D   On: 05/20/2019 13:34   Dg Abd Portable 1v  Result  Date: 05/20/2019 CLINICAL DATA:  Orogastric tube placement EXAM: PORTABLE ABDOMEN - 1 VIEW COMPARISON:  05/20/2019 FINDINGS: The OG tube projects over the region of the gastric body, but has been retracted slightly since the prior study. The visualized bowel gas pattern is nonspecific and nonobstructive. The heart size is enlarged. IMPRESSION: The oral gastric tube has been retracted slightly. The tip projects over the region of the gastric body. The side hole projects over the GE junction. Electronically Signed   By: Katherine Mantlehristopher  Green M.D.   On: 05/20/2019 12:08   Dg Abd Portable 1v  Result Date: 05/19/2019 CLINICAL DATA:  OG tube placement. EXAM: PORTABLE ABDOMEN - 1 VIEW COMPARISON:  01/09/2019 FINDINGS: Dense retrocardiac opacification. Cardiomegaly similar to prior study. Orogastric tube passes into the proximal stomach, side port at the GE junction. Bowel gas pattern unremarkable though incompletely imaged. IMPRESSION: Orogastric tube in proximal stomach, side-port EG junction. Consolidation at the left lung base may represent developing pneumonitis. Electronically Signed   By: Donzetta KohutGeoffrey  Wile M.D.   On: 05/19/2019 17:10   Dg Hip Port Unilat With Pelvis 1v Right  Result Date: 05/19/2019 CLINICAL DATA:  Status post fall, pain EXAM: DG HIP (WITH OR WITHOUT PELVIS) 1V PORT RIGHT COMPARISON:  None. FINDINGS: Limited examination secondary to technique and osteopenia. No acute fracture or dislocation. No aggressive osseous lesion. IMPRESSION: No acute osseous injury of the right hip. Given the patient's age and osteopenia, if there is persistent clinical concern for an occult hip fracture, a CT or MRI of the hip is recommended for increased sensitivity. Electronically Signed   By: Elige KoHetal  Patel   On: 05/19/2019 15:44    Labs:  CBC: Recent Labs    01/14/19 0235 05/19/19 1102 05/19/19 1103 05/19/19 1623 05/20/19 0442 05/21/19 0456  WBC 8.8 5.6  --   --  7.6 9.2  HGB 16.6* 16.1* 17.0* 15.3* 14.6  15.3*  HCT 49.8* 48.1* 50.0* 45.0 43.6 45.9  PLT 285 208  --   --  139* 206    COAGS: Recent Labs    05/19/19 1102  INR 1.4*  APTT 28    BMP: Recent Labs    05/04/19 1503 05/19/19 1102 05/19/19 1103 05/19/19 1623 05/20/19 0442 05/21/19 0456  NA 142 141 141 141 140 140  K 4.1 4.3 4.1 3.5 3.8 3.7  CL 102 105 104  --  110 112*  CO2 23 23  --   --  19* 16*  GLUCOSE 172* 153* 146*  --  102* 112*  BUN 17 16 17   --  13 12  CALCIUM 9.4 9.0  --   --  8.4* 8.3*  CREATININE 1.02* 1.09* 0.90  --  0.86 1.04*  GFRNONAA 59* 54*  --   --  >60 57*  GFRAA 68 >60  --   --  >60 >60    LIVER FUNCTION TESTS: Recent Labs    01/13/19 0238 01/15/19 0309 05/19/19 1102  BILITOT 1.1 0.8 1.8*  AST 41 29 27  ALT 66*  47* 21  ALKPHOS 77 70 119  PROT 7.9 8.0 6.1*  ALBUMIN 3.7 3.4* 3.1*    Assessment and Plan:  Right MCA M1 segment occlusion s/p emergent mechanical thrombectomy achieving a TICI 3 revascularization 05/19/2019 by Dr. Corliss Skains. Patient's condition improving- awake and alert today, can spontaneously move all extremities with some weakness of left side (can lift left extremities off bed). Right groin incision stable, distal pulses 1+ bilaterally. Further plans per neurology/CCM- appreciate and agree with management. Please call NIR with questions/concerns.   Electronically Signed: Elwin Mocha, PA-C 05/21/2019, 9:58 AM   I spent a total of 25 Minutes at the the patient's bedside AND on the patient's hospital floor or unit, greater than 50% of which was counseling/coordinating care for right MCA M1 segment occlusion s/p revascularization.

## 2019-05-21 NOTE — Progress Notes (Signed)
Pt tx from 4N19 to 3W13 after report given.  Daughter called, updated on new room and phone #

## 2019-05-21 NOTE — Plan of Care (Signed)

## 2019-05-21 NOTE — Progress Notes (Signed)
Pt daughter Margreta Journey) called at [phone] with update.

## 2019-05-21 NOTE — Progress Notes (Signed)
NAME:  Breanna CruiseBeverly Laplant, MRN:  [id number], DOB:  [birth year], LOS: 2 ADMISSION DATE:  05/19/2019, CONSULTATION DATE:  05/19/2019 REFERRING MD:  Dr. Otelia LimesLindzen, neurology, CHIEF COMPLAINT:  Left side weakness and speech difficulty   Brief History   63 year old female who was transported from [facility] ED to [facility] due to stroke 2/2 acute right MCA occlusion. She underwent arterial thrombectomy by IR at [facility]. PCCM consulted given several comorbidities and for mechanical ventilation post procedure and further monitoring.  History of present illness   63 year old female with past medical history of morbid obesity, CHF (nonischemic cardiomyopathy), HLD, HTN, prediabetes, presented to [facility] emergency department with acute left-sided weakness, and speech difficulty. She was last seen 9/23 at 9 PM by her family before she went to bed. She woke up at 1 AM and fell and injured her right hip.  She was found by her family on the floor and was taken to [facility] ER. At that time, patient was awake and able to explain the fall and hip injury but had speech difficulty and left side hemiplegia, left hemisensory loss and facial drrop. She was evaluated by telemetry neurology at [facility], and was not a candidate for TPA based on the time window. CT head showed no acute hypodensity but CTA of head and neck revealed a right MCA occlusion.  She was emergently transferred to [facility] to perform CT perfusion study and possible intervention by IR.  Past Medical History  chronic systolic CHF, HLD, HTN, NICM, morbid obesity and pre-diabetes.   Significant Hospital Events   9/24 Arterial thrombectomy of right MCA occlusion  Consults:  IR PCCM  Procedures:  9/24 percutaneous arterial thrombectomy: RT CCA arteriogram followed by complete revascularization of RT MCA M 1 occlusion with x 1 pass with 4mmx 40 mm Solitaire X retriever device achieving a TICI 3 revascularization  Significant Diagnostic Tests:   9/24 CT cerebral perfusion with contrast: : Nondiagnostic CT perfusion. 9/25 brain MRI without contrast> 9/24 Post Tx CT head without contrast> No ICH, no gross mass effect 9/24>DG hip unilateral>  No acute osseous injury of the right hip. Given the patient's age and osteopenia, if there is persistent clinical concern for an occult hip fracture, a CT or MRI of the hip is recommended for increased sensitivity. 9/25 Echo: dilated LV and RV, limited systolic function with EF<20%. No ventricular hypertrophy  Micro Data:  9/24 SARS COVID-19  > negative 9/24 MRSA PCR> negative Antimicrobials:    Interim history/subjective:  Self extubated ~5 p.m. 9/25, no respiratory or other events overnight. Found sitting in chair this a.m.  Objective   Blood pressure 115/83, pulse 86, temperature 97.6 F (36.4 C), temperature source Axillary, resp. rate (!) 25, weight 111.7 kg, SpO2 98 %.    Vent Mode: PRVC FiO2 (%):  [40 %] 40 % Set Rate:  [15 bmp-16 bmp] 15 bmp Vt Set:  [510 mL] 510 mL PEEP:  [5 cmH20] 5 cmH20 Plateau Pressure:  [17 cmH20-20 cmH20] 20 cmH20  Intake/Output Summary (Last 24 hours) at 05/21/2019 0903 Last data filed at 05/21/2019 0800 Gross per 24 hour  Intake 1407.4 ml  Output 900 ml  Net 507.4 ml   Filed Weights   05/19/19 1515  Weight: 111.7 kg    Examination: General:Appears well-developed and well-nourished. No distress.  Head: Normocephalic and atraumatic.  Eyes: Exophthalmos, Pupils symmetric, nl size and reactive to light; left hemianopsia Cardiovascular: S1, Decreased S2, RRR, No  Rub, gallop, or  murmur Respiratory: CTAB GI: Soft. Bowel sounds are normal. No distension. NT, obese Musculoskeletal: 2+ pitting edema, no obvious deformities noted Neurological: Interactive, conversant, MAE on request, CNII-XII grossly intact to confrontation  Resolved Hospital Problem list     Assessment & Plan:  MCA stroke secondary to right  MCA occlusion: Status post revascularization on arrival to [facility] 9/24  Risk factors: HLD, HTN, morbid obesity, CHF, prediabetes HGB A1C>>7.2 Plan -Per neurology -No antiplatelet for at least 24 hours following VIR. (Can restart if repeat CT head at 24 hours is negative for hemorrhagic conversion.) For MRI 9/25 -PT OT eval and treat -NPO >> Consider TF if unable to extubate next 24 hours -Speech eval -IV fluids at 75 mL/h - Strict I&O - monitor + fluid balance daily -TTE completed, results above  Intubated for airway protection 2/2 revascularization procedure   CHF: Last heart cath 01/13/2019: dilated nonischemic cardiomyopathy, she might be a candidate for BiV ICD/CRT. Hypervolemic on exam (LEE) EKG with PVC. Electrolytes normal.  EF 20% Plan -Trend BNP - Trend Mag  -Lasix 40 mg per tube. twice daily -Carvedilol 6.25 mg p.o. twice daily -Spironolactone 12.5 mg daily - Tele  -Strict I's/O - monitor + fluid balance daily - Weight daily -Trend Troponin -cardiology consult today  Renal Creatinine 0.65 Plan I/Os, diurese as able  F/E/N:  Trend BMET Trend Mag Maintain renal perfusion Replete electrolytes as indicated   Thrombocytopenia Platelet drop from 208 to 139 last 24 hours Plan Trend CBC Trend Coags anticoag on hold x 24 hours post thrombectomy Monitor for bleeding  Right hip pain 2/2 fall:  Concerning for Fx. No leg length discrepancy Distal pulses are present FU hip x ray no acute osseous injury of the right hip. Plan - Consider MRI hip    Exophthalmos -TSH>> 0.884 Plan Monitor  Pre DM: -SSI -CBG monitoring HGB A1c noted  Best practice:  Diet: now on regular diet Pain/Anxiety/Delirium protocol (if indicated): None VAP protocol (if indicated): None DVT prophylaxis: SCDs (holding anticoagulation for 24 hours post IR thrombectomy) GI prophylaxis: Senna Glucose control: SSI, CBG Mobility: OOB with assistance Code Status:  Full Family Communication: Disposition: Neuro ICU  Labs   CBC: Recent Labs  Lab 05/19/19 1102 05/19/19 1103 05/19/19 1623 05/20/19 0442 05/21/19 0456  WBC 5.6  --   --  7.6 9.2  NEUTROABS 3.5  --   --  5.5  --   HGB 16.1* 17.0* 15.3* 14.6 15.3*  HCT 48.1* 50.0* 45.0 43.6 45.9  MCV 85.7  --   --  83.4 84.4  PLT 208  --   --  139* 505    Basic Metabolic Panel: Recent Labs  Lab 05/19/19 1102 05/19/19 1103 05/19/19 1623 05/19/19 1700 05/20/19 0442 05/21/19 0456  NA 141 141 141  --  140 140  K 4.3 4.1 3.5  --  3.8 3.7  CL 105 104  --   --  110 112*  CO2 23  --   --   --  19* 16*  GLUCOSE 153* 146*  --   --  102* 112*  BUN 16 17  --   --  13 12  CREATININE 1.09* 0.90  --   --  0.86 1.04*  CALCIUM 9.0  --   --   --  8.4* 8.3*  MG  --   --   --  1.7  --  1.7  PHOS  --   --   --   --   --  4.5   GFR: Estimated Creatinine Clearance: 72.5 mL/min (A) (by C-G formula based on SCr of 1.04 mg/dL (H)). Recent Labs  Lab 05/19/19 1102 05/20/19 0442 05/21/19 0456  WBC 5.6 7.6 9.2    Liver Function Tests: Recent Labs  Lab 05/19/19 1102  AST 27  ALT 21  ALKPHOS 119  BILITOT 1.8*  PROT 6.1*  ALBUMIN 3.1*   No results for input(s): LIPASE, AMYLASE in the last 168 hours. No results for input(s): AMMONIA in the last 168 hours.  ABG    Component Value Date/Time   PHART 7.470 (H) 05/19/2019 1623   PCO2ART 29.8 (L) 05/19/2019 1623   PO2ART 342.0 (H) 05/19/2019 1623   HCO3 21.7 05/19/2019 1623   TCO2 23 05/19/2019 1623   ACIDBASEDEF 1.0 05/19/2019 1623   O2SAT 100.0 05/19/2019 1623     Coagulation Profile: Recent Labs  Lab 05/19/19 1102  INR 1.4*    Cardiac Enzymes: No results for input(s): CKTOTAL, CKMB, CKMBINDEX, TROPONINI in the last 168 hours.  HbA1C: Hgb A1c MFr Bld  Date/Time Value Ref Range Status  05/20/2019 04:42 AM 7.2 (H) 4.8 - 5.6 % Final    Comment:    (NOTE) Pre diabetes:          5.7%-6.4% Diabetes:              >6.4% Glycemic control  for   <7.0% adults with diabetes   05/19/2019 02:30 PM 7.2 (H) 4.8 - 5.6 % Final    Comment:    (NOTE) Pre diabetes:          5.7%-6.4% Diabetes:              >6.4% Glycemic control for   <7.0% adults with diabetes     Past Medical History  She,  has a past medical history of Chronic systolic CHF (congestive heart failure) (HCC), Elevated hemoglobin (HCC), GERD (gastroesophageal reflux disease), Hyperlipidemia, Hypertension, LBBB (left bundle branch block), Mild renal insufficiency, Mitral regurgitation, NICM (nonischemic cardiomyopathy) (HCC), Pre-diabetes, and Tricuspid regurgitation.   Surgical History    Past Surgical History:  Procedure Laterality Date  . RADIOLOGY WITH ANESTHESIA N/A 05/19/2019   Procedure: IR WITH ANESTHESIA;  Surgeon: Julieanne Cotton, MD;  Location: [facility] OR;  Service: Radiology;  Laterality: N/A;  . RIGHT/LEFT HEART CATH AND CORONARY ANGIOGRAPHY N/A 01/13/2019   Procedure: RIGHT/LEFT HEART CATH AND CORONARY ANGIOGRAPHY;  Surgeon: Runell Gess, MD;  Location: [facility] INVASIVE CV LAB;  Service: Cardiovascular;  Laterality: N/A;  . TUBAL LIGATION       Social History   reports that she has never smoked. She has never used smokeless tobacco. She reports that she does not drink alcohol or use drugs.   Family History   Her family history includes CAD in her mother; Colon cancer in her father; Heart failure in her mother; Hypertension in her mother.   Allergies Allergies  Allergen Reactions  . Lisinopril Cough  . Losartan Cough     Home Medications  Prior to Admission medications   Medication Sig Start Date End Date Taking? Authorizing Provider  albuterol (VENTOLIN HFA) 108 (90 Base) MCG/ACT inhaler Inhale 2 puffs into the lungs every 4 (four) hours as needed for shortness of breath. 11/08/18   [provider]  ASPIRIN 81 PO Take 1 tablet by mouth daily.    [provider]  atorvastatin (LIPITOR) 10 MG tablet Take 10 mg by mouth daily.  10/19/18   [provider]  carvedilol (COREG) 6.25 MG  tablet TAKE 1 TABLET(6.25 MG) BY MOUTH TWICE DAILY WITH A MEAL 05/05/19   Georgeanna Lea, MD  famotidine (PEPCID) 40 MG tablet Take 1 tablet (40 mg total) by mouth daily. 10/19/18   [provider]  fluticasone (FLONASE) 50 MCG/ACT nasal spray Place 2 sprays into both nostrils daily. 01/03/19   [provider]  furosemide (LASIX) 40 MG tablet Take 1 tablet (40 mg total) by mouth 2 (two) times daily. 05/16/19   Georgeanna Lea, MD  loratadine (CLARITIN) 10 MG tablet Take 10 mg by mouth daily. 11/08/18   [provider]  montelukast (SINGULAIR) 10 MG tablet Take 10 mg by mouth at bedtime.    [provider]  spironolactone (ALDACTONE) 25 MG tablet Take 0.5 tablets (12.5 mg total) by mouth daily. 03/24/19   Dunn, Tacey Ruiz, PA-C  Vitamin D, Ergocalciferol, (DRISDOL) 1.25 MG (50000 UT) CAPS capsule Take 1 capsule by mouth once a week. 03/30/19   [provider]      I have independently seen and examined the patient, reviewed data, and developed an assessment and plan. A total of 32 minutes were spent in critical care assessment and medical decision making. This critical care time does not reflect procedure time, or teaching time or supervisory time of PA/NP/Med student/Med Resident, etc but could involve care discussion time.  Gwynne Edinger, MD PhD    05/21/2019 9:03 AM

## 2019-05-22 LAB — PROTIME-INR
INR: 1.3 — ABNORMAL HIGH (ref 0.8–1.2)
Prothrombin Time: 15.6 seconds — ABNORMAL HIGH (ref 11.4–15.2)

## 2019-05-22 LAB — GLUCOSE, CAPILLARY
Glucose-Capillary: 90 mg/dL (ref 70–99)
Glucose-Capillary: 99 mg/dL (ref 70–99)
Glucose-Capillary: 109 mg/dL — ABNORMAL HIGH (ref 70–99)
Glucose-Capillary: 121 mg/dL — ABNORMAL HIGH (ref 70–99)
Glucose-Capillary: 165 mg/dL — ABNORMAL HIGH (ref 70–99)
Glucose-Capillary: 112 mg/dL — ABNORMAL HIGH (ref 70–99)

## 2019-05-22 MED ORDER — MONTELUKAST SODIUM 10 MG PO TABS
10.0000 mg | ORAL_TABLET | Freq: Every day | ORAL | Status: DC
  Administered 2019-05-22 – 2019-05-29 (×8): 10 mg via ORAL
  Filled 2019-05-22 (×8): qty 1

## 2019-05-22 MED ORDER — WARFARIN SODIUM 7.5 MG PO TABS
7.5000 mg | ORAL_TABLET | Freq: Once | ORAL | Status: AC
  Administered 2019-05-22: 7.5 mg via ORAL
  Filled 2019-05-22: qty 1

## 2019-05-22 MED ORDER — LORATADINE 10 MG PO TABS: 10.0000 mg | ORAL_TABLET | Freq: Every day | ORAL | Status: DC | PRN

## 2019-05-22 MED ORDER — ALBUTEROL SULFATE (2.5 MG/3ML) 0.083% IN NEBU: 3.0000 mL | INHALATION_SOLUTION | RESPIRATORY_TRACT | Status: DC | PRN

## 2019-05-22 MED ORDER — SPIRONOLACTONE 12.5 MG HALF TABLET
12.5000 mg | ORAL_TABLET | Freq: Every day | ORAL | Status: DC
  Administered 2019-05-23 – 2019-05-30 (×8): 12.5 mg via ORAL
  Filled 2019-05-22 (×8): qty 1

## 2019-05-22 MED ORDER — WARFARIN - PHARMACIST DOSING INPATIENT
Freq: Every day | Status: DC
  Administered 2019-05-26 – 2019-05-29 (×4)

## 2019-05-22 MED ORDER — CARVEDILOL 6.25 MG PO TABS
6.2500 mg | ORAL_TABLET | Freq: Two times a day (BID) | ORAL | Status: DC
  Administered 2019-05-22 – 2019-05-30 (×16): 6.25 mg via ORAL
  Filled 2019-05-22 (×16): qty 1

## 2019-05-22 MED ORDER — FAMOTIDINE 20 MG PO TABS
40.0000 mg | ORAL_TABLET | Freq: Every day | ORAL | Status: DC
  Administered 2019-05-22 – 2019-05-29 (×8): 40 mg via ORAL
  Filled 2019-05-22 (×8): qty 2

## 2019-05-22 MED ORDER — FUROSEMIDE 40 MG PO TABS
40.0000 mg | ORAL_TABLET | Freq: Every day | ORAL | Status: DC
  Administered 2019-05-22 – 2019-05-30 (×9): 40 mg via ORAL
  Filled 2019-05-22 (×6): qty 1
  Filled 2019-05-22: qty 2
  Filled 2019-05-22: qty 1

## 2019-05-22 MED ORDER — FLUTICASONE PROPIONATE 50 MCG/ACT NA SUSP
2.0000 | Freq: Every day | NASAL | Status: DC | PRN
  Filled 2019-05-22: qty 16

## 2019-05-22 NOTE — Progress Notes (Signed)
Please see initial consult note from yesterday regarding recommendations. No additional recs at this time, please call with questions.  [facility] will sign off.   Medication Recommendations:  Resume home CHF meds when taking PO. Recommend coumadin for anticoagulation (as opposed to NOACs based on 11/2018 JAMA Cardiology paper), timing per neurology Other recommendations (labs, testing, etc):  none Follow up as an outpatient:  Routine f/u with her primary cardiologist     Carlyle Dolly MD

## 2019-05-22 NOTE — Progress Notes (Signed)
ANTICOAGULATION CONSULT NOTE - Initial Consult  Pharmacy Consult for Warfarin Indication: stroke  Allergies  Allergen Reactions  . Lisinopril Cough  . Losartan Cough    Patient Measurements: Height: 5\' 8"  (172.7 cm) Weight: 248 lb 14.4 oz (112.9 kg) IBW/kg (Calculated) : 63.9  Vital Signs: Temp: 97.6 F (36.4 C) (09/27 1140) Temp Source: Oral (09/27 1140) BP: 103/86 (09/27 1140) Pulse Rate: 85 (09/27 1140)  Labs: Recent Labs    05/19/19 1623 05/19/19 1700 05/19/19 1822 05/20/19 0442 05/21/19 0456  HGB 15.3*  --   --  14.6 15.3*  HCT 45.0  --   --  43.6 45.9  PLT  --   --   --  139* 206  CREATININE  --   --   --  0.86 1.04*  TROPONINIHS  --  14 12  --   --     Estimated Creatinine Clearance: 73 mL/min (A) (by C-G formula based on SCr of 1.04 mg/dL (H)).   Medical History: Past Medical History:  Diagnosis Date  . Chronic systolic CHF (congestive heart failure) ([place])   . Elevated hemoglobin ([place])   . GERD (gastroesophageal reflux disease)   . Hyperlipidemia   . Hypertension   . LBBB (left bundle branch block)   . Mild renal insufficiency   . Mitral regurgitation   . NICM (nonischemic cardiomyopathy) ([place])   . Pre-diabetes   . Tricuspid regurgitation     Assessment: 17 YOF who presented on 9/24 with acute R-MCA occlusion s/p revascularization. The patient was also noted to have a low EF<20%. Pharmacy consulted to start Warfarin for CVA prophylaxis.   The patient is on Lovenox 60 mg/24h for VTE prophylaxis and ASA325 mg for secondary CVA prophylaxis - will plan to continue these until INR>/=2.   Goal of Therapy:  INR 2-3 Monitor platelets by anticoagulation protocol: Yes   Plan:  - Warfarin 7.5 mg x 1 dose at 1800 today - Daily PT/INR, CBC q72h - Will continue to monitor for any signs/symptoms of bleeding and will follow up with PT/INR in the a.m.    Thank you for allowing pharmacy to be a part of this patient's care.  Alycia Rossetti, PharmD,  BCPS Clinical Pharmacist Clinical phone for 05/22/2019: [phone] 05/22/2019 12:16 PM   **Pharmacist phone directory can now be found on [email or web address] ([email or web address]).  Listed under [place].

## 2019-05-22 NOTE — Progress Notes (Signed)
STROKE TEAM PROGRESS NOTE   INTERVAL HISTORY She is alert, says she is doing well, has been working with PT and walking. We discussed starting Coumadin per cardiology recommendations, I discussed with patient, risks and that she will have to be followed regularly for bloodwork, she has a cardiologist outpatient. Consult to pharmacy for Coumadin initiation placed.   Vitals:   05/22/19 0745 05/22/19 1140 05/22/19 1430 05/22/19 1634  BP: (!) 118/96 103/86 92/78 (!) 120/99  Pulse: 96 85 (!) 110 (!) 102  Resp: 16 15 18    Temp: (!) 97.5 F (36.4 C) 97.6 F (36.4 C) 97.9 F (36.6 C)   TempSrc: Oral Oral Oral   SpO2: 98% 100% 99% 100%  Weight:      Height:        CBC:  Recent Labs  Lab 05/19/19 1102  05/20/19 0442 05/21/19 0456  WBC 5.6  --  7.6 9.2  NEUTROABS 3.5  --  5.5  --   HGB 16.1*   < > 14.6 15.3*  HCT 48.1*   < > 43.6 45.9  MCV 85.7  --  83.4 84.4  PLT 208  --  139* 206   < > = values in this interval not displayed.    Basic Metabolic Panel:  Recent Labs  Lab 05/19/19 1700 05/20/19 0442 05/21/19 0456  NA  --  140 140  K  --  3.8 3.7  CL  --  110 112*  CO2  --  19* 16*  GLUCOSE  --  102* 112*  BUN  --  13 12  CREATININE  --  0.86 1.04*  CALCIUM  --  8.4* 8.3*  MG 1.7  --  1.7  PHOS  --   --  4.5   Lipid Panel:     Component Value Date/Time   CHOL 96 05/20/2019 0441   TRIG 90 05/20/2019 0441   HDL 15 (L) 05/20/2019 0441   CHOLHDL 6.4 05/20/2019 0441   VLDL 18 05/20/2019 0441   LDLCALC 63 05/20/2019 0441   HgbA1c:  Lab Results  Component Value Date   HGBA1C 7.2 (H) 05/20/2019   Urine Drug Screen:     Component Value Date/Time   LABOPIA NONE DETECTED 05/19/2019 1454   COCAINSCRNUR NONE DETECTED 05/19/2019 1454   LABBENZ NONE DETECTED 05/19/2019 1454   AMPHETMU NONE DETECTED 05/19/2019 1454   THCU NONE DETECTED 05/19/2019 1454   LABBARB NONE DETECTED 05/19/2019 1454    Alcohol Level     Component Value Date/Time   ETH <10 05/19/2019 1102     IMAGING  Mr Brain Wo Contrast Mr Angio Head Wo Contrast Mr Angio Neck W Wo Contrast 05/20/2019 IMPRESSION:   3.5 cm region of acute infarction in the right insula and temporal lobe. Few other scattered punctate infarctions in the right MCA territory. No mass effect or hemorrhage.   Restoration of flow in the right MCA territory. No new intracranial occlusion.   Atherosclerotic plaque at both carotid bulb regions but without stenosis.   Tiny/thready right vertebral artery which could be congenital or due to acquired right vertebral disease.   The left vertebral is large vessel that appears widely patent and normal.   Ct Cerebral Perfusion W Contrast 05/19/2019 IMPRESSION:  Nondiagnostic CT perfusion.   Dg Chest Port 1 View 05/19/2019 IMPRESSION:  Interval placement of endotracheal tube suitably position above the carina. Cardiomegaly with chronic lung changes   Dg Abd Portable 1v 05/20/2019 IMPRESSION:  The OG tube has been advanced  with the side port and distal tip both in the stomach.   Dg Abd Portable 1v 05/20/2019 IMPRESSION:  The oral gastric tube has been retracted slightly. The tip projects over the region of the gastric body. The side hole projects over the GE junction.  Dg Abd Portable 1v 05/19/2019 IMPRESSION:  Orogastric tube in proximal stomach, side-port EG junction. Consolidation at the left lung base may represent developing pneumonitis.   Dg Hip Port Unilat With Pelvis 1v Right 05/19/2019 IMPRESSION:  No acute osseous injury of the right hip. Given the patient's age and osteopenia, if there is persistent clinical concern for an occult hip fracture, a CT or MRI of the hip is recommended for increased sensitivity.  DG Chest 1 View  05/19/19 IMPRESSION: Interval placement of endotracheal tube suitably position above the carina. Cardiomegaly with chronic lung changes  DG Chest 1 View - pending 05/21/19   Cerebral Angiogram 9/24/22020 S/P RT CCA  arteriogram followed by complete revascularization of RT MCA M 1 occlusion with x 1 pass with 40 mm Solitaire X retriever device achieving a TICI 3 revascularization.Marland Kitchen  PHYSICAL EXAM  Temp:  [97.4 F (36.3 C)-98.1 F (36.7 C)] 97.9 F (36.6 C) (09/27 1430) Pulse Rate:  [81-110] 102 (09/27 1634) Resp:  [15-18] 18 (09/27 1430) BP: (92-121)/(78-99) 120/99 (09/27 1634) SpO2:  [97 %-100 %] 100 % (09/27 1634) Weight:  [112.9 kg] 112.9 kg (09/26 2028)  General - Well nourished, pleasant  Ophthalmologic - fundi not visualized due to small pupils.  Cardiovascular - Regular rate and rhythm.  Neuro - alert, sitting in chair, following commands, only oriented to name and says in hospital, pleasant, exophthalmos, left hemianopsia, eomi, face symmetric, tongue protrusion midline, ntigravity on the right, left side antigravity with drift, 1+ patellars and biceps, toes equiv, w/d to pain x 4. She can stand and transfer with help.    ASSESSMENT/PLAN Breanna Bennett is a 63 y.o. female with history of HTN, HLD, CHF, pre-diabetes, morbid obesity, LBBB and GERD presenting to [facility] who woke with L sided weakness, L hemisensory deficit, speech difficulty and L facial droop. To [facility] for IR.   Stroke: R MCA infarct due to right M1 occlusion s/p IR with TICI3 reperfusion - embolic secondary to severe cardiomyopathy with low EF - coumadin started  CT head [facility]) no hypodensity  CTA head & neck ([place]) R MCA occlusion   CT perfusion nondiagnostic  Cerebral angio R M1 occlusion w/ TICI3 revascularization    Post IR CT no ICH or gross mass effect   MRI - 3.5 cm region of acute infarction in the right insula and temporal lobe. Few other scattered punctate infarctions in the right MCA territory.  MRA H&N - Restoration of flow in the right MCA territory. No new intracranial occlusion. Atherosclerotic plaque at both carotid bulb regions but without stenosis. Tiny/thready right  vertebral artery which could be congenital or due to acquired right vertebral disease. The left vertebral is large vessel that appears widely patent and normal.   CT Perfusion -  Non diagnostic  2D Echo EF < 20%  LE venous doppler - b/l LE edema - Preliminary report There is no evidence of deep vein thrombosis in the lower extremity.   Repeat CXR - 9/26 - Evidence of early edema with cardiomegaly.  LDL 63  HgbA1c 7.2  SCDs for VTE prophylaxis. Added Lovenox 40 mg sq daily    aspirin 81 mg daily prior to admission, now on ASA. Placed Pharm  consult for coumadin anticoagulation due to low EF.  Therapy recommendations:  pending   Disposition:  pending   Chronic systolic Congestive heart failure HFrEF w/ NICM  EF < 20%  Home meds:  lasix 40, spironolactone 12.5, coreg 6.25  BNP 3910.8   B/l pedal edema  Gentle hydration, now on IVF @ 40  Cardiology consult -> called, appreciate cardiology  Will need anticoagulation for stroke prevention.  Per Cards, Coumadin preferred.  Hypertension  Home meds:  Coreg 6.25 bid . BP goal 120-140 per IR x 24h post IR . Long-term BP goal normotensive  Hyperlipidemia  Home meds:  lipitor 10  LDL 63, goal < 70  Will resume lipitor once po access - resume Lipitor please  Continue statin at discharge  Diabetes type II Uncontrolled  Home meds:  none  HgbA1c 7.2, goal < 7.0  CBGs  SSI  Dysphagia . Secondary to stroke . NPO . Speech on board . Resume home meds    Other Stroke Risk Factors  Morbid Obesity, Body mass index is 37.85 kg/m., recommend weight loss, diet and exercise as appropriate   Other Active Problems  Exophthalmos, TSH normal  R hip xray neg for fx  Creatinine - 1.04 - post contrast   Hospital day # 3  Personally examined patient and images, and have participated in and made any corrections needed to history, physical, neuro exam,assessment and plan as stated above.  I have personally obtained the  history, evaluated lab date, reviewed imaging studies and agree with radiology interpretations.      A total of 15 minutes was spent for the care of this patient, spent on counseling patient and family on different diagnostic and therapeutic options, counseling and coordination of care, riskd ans benefits of management, compliance, or risk factor reduction and education.     To contact Stroke Continuity provider, please refer to [email or web address]. After hours, contact General Neurology

## 2019-05-22 NOTE — Plan of Care (Signed)
  Problem: Education: Goal: Knowledge of disease or condition will improve Outcome: Progressing Goal: Knowledge of secondary prevention will improve Outcome: Progressing Goal: Knowledge of patient specific risk factors addressed and post discharge goals established will improve Outcome: Progressing Goal: Individualized Educational Video(s) Outcome: Progressing   Problem: Coping: Goal: Will verbalize positive feelings about self Outcome: Progressing Goal: Will identify appropriate support needs Outcome: Progressing   Problem: Health Behavior/Discharge Planning: Goal: Ability to manage health-related needs will improve Outcome: Progressing   Problem: Self-Care: Goal: Ability to participate in self-care as condition permits will improve Outcome: Progressing Goal: Verbalization of feelings and concerns over difficulty with self-care will improve Outcome: Progressing Goal: Ability to communicate needs accurately will improve Outcome: Progressing   Problem: Nutrition: Goal: Risk of aspiration will decrease Outcome: Progressing Goal: Dietary intake will improve Outcome: Progressing   Problem: Ischemic Stroke/TIA Tissue Perfusion: Goal: Complications of ischemic stroke/TIA will be minimized Outcome: Progressing   Breanna Bennett, BSN, RN 

## 2019-05-22 NOTE — Plan of Care (Signed)
  Problem: Education: Goal: Knowledge of disease or condition will improve Outcome: Progressing Goal: Knowledge of secondary prevention will improve Outcome: Progressing Goal: Knowledge of patient specific risk factors addressed and post discharge goals established will improve Outcome: Progressing Goal: Individualized Educational Video(s) Outcome: Progressing   Problem: Coping: Goal: Will verbalize positive feelings about self Outcome: Progressing Goal: Will identify appropriate support needs Outcome: Progressing   Problem: Health Behavior/Discharge Planning: Goal: Ability to manage health-related needs will improve Outcome: Progressing   Problem: Self-Care: Goal: Ability to participate in self-care as condition permits will improve Outcome: Progressing Goal: Verbalization of feelings and concerns over difficulty with self-care will improve Outcome: Progressing Goal: Ability to communicate needs accurately will improve Outcome: Progressing   Problem: Nutrition: Goal: Risk of aspiration will decrease Outcome: Progressing Goal: Dietary intake will improve Outcome: Progressing   Problem: Ischemic Stroke/TIA Tissue Perfusion: Goal: Complications of ischemic stroke/TIA will be minimized Outcome: Progressing   Casten Floren, BSN, RN 

## 2019-05-22 NOTE — Progress Notes (Signed)
Report from [place], RN (ICU) at [phone]. On unit at 2021.

## 2019-05-22 NOTE — Plan of Care (Signed)
  Problem: Education: Goal: Knowledge of disease or condition will improve 05/22/2019 0146 by Lennox Grumbles, RN Outcome: Progressing 05/22/2019 0042 by Lennox Grumbles, RN Outcome: Progressing Goal: Knowledge of secondary prevention will improve 05/22/2019 0146 by Lennox Grumbles, RN Outcome: Progressing 05/22/2019 0042 by Lennox Grumbles, RN Outcome: Progressing Goal: Knowledge of patient specific risk factors addressed and post discharge goals established will improve 05/22/2019 0146 by Lennox Grumbles, RN Outcome: Progressing 05/22/2019 0042 by Lennox Grumbles, RN Outcome: Progressing Goal: Individualized Educational Video(s) 05/22/2019 0146 by Lennox Grumbles, RN Outcome: Progressing 05/22/2019 0042 by Lennox Grumbles, RN Outcome: Progressing   Problem: Coping: Goal: Will verbalize positive feelings about self 05/22/2019 0146 by Lennox Grumbles, RN Outcome: Progressing 05/22/2019 0042 by Lennox Grumbles, RN Outcome: Progressing Goal: Will identify appropriate support needs 05/22/2019 0146 by Lennox Grumbles, RN Outcome: Progressing 05/22/2019 0042 by Lennox Grumbles, RN Outcome: Progressing   Problem: Health Behavior/Discharge Planning: Goal: Ability to manage health-related needs will improve 05/22/2019 0146 by Lennox Grumbles, RN Outcome: Progressing 05/22/2019 0042 by Lennox Grumbles, RN Outcome: Progressing   Problem: Self-Care: Goal: Ability to participate in self-care as condition permits will improve 05/22/2019 0146 by Lennox Grumbles, RN Outcome: Progressing 05/22/2019 0042 by Lennox Grumbles, RN Outcome: Progressing Goal: Verbalization of feelings and concerns over difficulty with self-care will improve 05/22/2019 0146 by Lennox Grumbles, RN Outcome: Progressing 05/22/2019 0042 by Lennox Grumbles, RN Outcome: Progressing Goal: Ability to communicate needs accurately will improve 05/22/2019 0146 by Lennox Grumbles, RN Outcome: Progressing 05/22/2019 0042 by Lennox Grumbles, RN Outcome:  Progressing   Problem: Nutrition: Goal: Risk of aspiration will decrease 05/22/2019 0146 by Lennox Grumbles, RN Outcome: Progressing 05/22/2019 0042 by Lennox Grumbles, RN Outcome: Progressing Goal: Dietary intake will improve 05/22/2019 0146 by Lennox Grumbles, RN Outcome: Progressing 05/22/2019 0042 by Lennox Grumbles, RN Outcome: Progressing   Problem: Ischemic Stroke/TIA Tissue Perfusion: Goal: Complications of ischemic stroke/TIA will be minimized 05/22/2019 0146 by Lennox Grumbles, RN Outcome: Progressing 05/22/2019 0042 by Lennox Grumbles, RN Outcome: Progressing   Ival Bible, BSN, RN

## 2019-05-23 DIAGNOSIS — I6601 Occlusion and stenosis of right middle cerebral artery: Secondary | ICD-10-CM

## 2019-05-23 DIAGNOSIS — I639 Cerebral infarction, unspecified: Secondary | ICD-10-CM

## 2019-05-23 LAB — BASIC METABOLIC PANEL
Anion gap: 10 (ref 5–15)
BUN: 14 mg/dL (ref 8–23)
CO2: 22 mmol/L (ref 22–32)
Calcium: 8.4 mg/dL — ABNORMAL LOW (ref 8.9–10.3)
Chloride: 110 mmol/L (ref 98–111)
Creatinine, Ser: 0.99 mg/dL (ref 0.44–1.00)
GFR calc Af Amer: >60 mL/min (ref >60)
GFR calc non Af Amer: >60 mL/min (ref >60)
Glucose, Bld: 105 mg/dL — ABNORMAL HIGH (ref 70–99)
Potassium: 3.2 mmol/L — ABNORMAL LOW (ref 3.5–5.1)
Sodium: 142 mmol/L (ref 135–145)

## 2019-05-23 LAB — GLUCOSE, CAPILLARY
Glucose-Capillary: 91 mg/dL (ref 70–99)
Glucose-Capillary: 139 mg/dL — ABNORMAL HIGH (ref 70–99)
Glucose-Capillary: 136 mg/dL — ABNORMAL HIGH (ref 70–99)
Glucose-Capillary: 138 mg/dL — ABNORMAL HIGH (ref 70–99)
Glucose-Capillary: 129 mg/dL — ABNORMAL HIGH (ref 70–99)

## 2019-05-23 LAB — TROPONIN I (HIGH SENSITIVITY): Troponin I (High Sensitivity): 57 ng/L — ABNORMAL HIGH (ref <18)

## 2019-05-23 LAB — PROTIME-INR
INR: 1.6 — ABNORMAL HIGH (ref 0.8–1.2)
Prothrombin Time: 18.6 seconds — ABNORMAL HIGH (ref 11.4–15.2)

## 2019-05-23 MED ORDER — POTASSIUM CHLORIDE 10 MEQ/100ML IV SOLN
10.0000 meq | INTRAVENOUS | Status: AC
  Administered 2019-05-23 (×4): 10 meq via INTRAVENOUS
  Filled 2019-05-23 (×4): qty 100

## 2019-05-23 MED ORDER — WARFARIN SODIUM 5 MG PO TABS
5.0000 mg | ORAL_TABLET | Freq: Once | ORAL | Status: AC
  Administered 2019-05-23: 5 mg via ORAL
  Filled 2019-05-23: qty 1

## 2019-05-23 MED ORDER — POTASSIUM CHLORIDE 20 MEQ PO PACK
40.0000 meq | PACK | Freq: Two times a day (BID) | ORAL | Status: DC
  Administered 2019-05-23 – 2019-05-25 (×6): 40 meq via ORAL
  Filled 2019-05-23 (×6): qty 2

## 2019-05-23 NOTE — Progress Notes (Signed)
Pharmacist Heart Failure Core Measure Documentation  Assessment: Breanna Bennett has an EF documented as < 20% on 05/20/19 by ECHO.  Rationale: Heart failure patients with left ventricular systolic dysfunction (LVSD) and an EF < 40% should be prescribed an angiotensin converting enzyme inhibitor (ACEI) or angiotensin receptor blocker (ARB) at discharge unless a contraindication is documented in the medical record.  This patient is not currently on an ACEI or ARB for HF.  This note is being placed in the record in order to provide documentation that a contraindication to the use of these agents is present for this encounter.  ACE Inhibitor or Angiotensin Receptor Blocker is contraindicated (specify all that apply)  [x]   ACEI allergy AND ARB allergy (cough to both) []   Angioedema []   Moderate or severe aortic stenosis []   Hyperkalemia []   Hypotension []   Renal artery stenosis []   Worsening renal function, preexisting renal disease or dysfunction   Henry Demeritt D. Mina Marble, PharmD, BCPS, [place] 05/23/2019, 1:12 PM

## 2019-05-23 NOTE — Progress Notes (Signed)
ANTICOAGULATION CONSULT NOTE  Pharmacy Consult:  Coumadin Indication: stroke  Allergies  Allergen Reactions  . Lisinopril Cough  . Losartan Cough    Patient Measurements: Height: 5\' 8"  (172.7 cm) Weight: 248 lb 14.4 oz (112.9 kg) IBW/kg (Calculated) : 63.9  Vital Signs: Temp: 96.6 F (35.9 C) (09/28 0938) Temp Source: Axillary (09/28 0938) BP: 115/86 (09/28 0938) Pulse Rate: 84 (09/28 0938)  Labs: Recent Labs    05/21/19 0456 05/22/19 1308 05/23/19 0254 05/23/19 0355  HGB 15.3*  --   --   --   HCT 45.9  --   --   --   PLT 206  --   --   --   LABPROT  --  15.6* 18.6*  --   INR  --  1.3* 1.6*  --   CREATININE 1.04*  --   --  0.99  TROPONINIHS  --   --   --  57*    Estimated Creatinine Clearance: 76.7 mL/min (by C-G formula based on SCr of 0.99 mg/dL).   Assessment: 34 YOF who presented on 9/24 with acute R-MCA occlusion s/p revascularization. The patient was also noted to have a low EF<20%. Pharmacy consulted to start Coumadin for CVA prophylaxis.  Ppatient is on Lovenox 60mg  SQ Q24H for VTE prophylaxis and ASA325 mg for secondary CVA prophylaxis - will plan to continue these until INR >/= 2.   INR trended up to 1.6 post one Coumadin 7.5mg  PO dose.  Baseline INR was elevated.  Renal function is stable.  No bleeding reported.  Goal of Therapy:  INR 2-3 Monitor platelets by anticoagulation protocol: Yes   Plan:  Coumadin 5mg  PO today Lovenox 60mg  SQ Q24H per MD until INR >/= 2 Daily PT / INR  Delois Silvester D. Mina Marble, PharmD, BCPS, [place] 05/23/2019, 10:02 AM

## 2019-05-23 NOTE — Progress Notes (Signed)
  Speech Language Pathology Treatment: Cognitive-Linquistic  Patient Details Name: Breanna Bennett MRN: [id number] DOB: [birth year] Today's Date: 05/23/2019 Time: 4097-3532 SLP Time Calculation (min) (ACUTE ONLY): 22 min  Assessment / Plan / Recommendation Clinical Impression  Pt was seen for cognitive-linguistic treatment and was cooperative throughout the session. Pt's daughter arrived at the end of the session and she was educated regarding the results of the evaluation and recommendations. She verbalized understanding and agreed that the pt may not be fully at her baseline with regard to cognition. She required mod to max cues for time management and min-mod cues for problem solving related to safety. She achieved 100% with immediate 4-item recall. With recall of 5 items she demonstrated 40% accuracy increasing to 100% with min-mod cues. She demonstrated 20% accuracy with a 3-task mental manipulation activity increasing to 80% with mod to max cues. SLP will continue to follow pt.    HPI HPI: Pt is a 63 y.o. female who [facility] ED with acute onset of left hemiplegia, left hemisensory loss and left facial droop. MRI of the brain 3.5 cm region of acute infarction in the right insula and temporal lobe. Few other scattered punctate infarctions in the right MCA territory.      SLP Plan  Continue with current plan of care  Patient needs continued Speech Lanaguage Pathology Services    Recommendations                   Follow up Recommendations: Inpatient Rehab SLP Visit Diagnosis: Cognitive communication deficit (D92.426) Plan: Continue with current plan of care       Kamdin Follett I. Hardin Negus, [place], [place] Office number [phone] Pager [place] 05/23/2019, 3:54 PM

## 2019-05-23 NOTE — Progress Notes (Signed)
Dr. Rory Percy informed of pt's potassium 3.2 and troponin I 57.

## 2019-05-23 NOTE — Progress Notes (Signed)
SLP Cancellation Note  Patient Details Name: Cherril Hett MRN: [id number] DOB: 25-Dec-1955   Cancelled treatment:       Reason Eval/Treat Not Completed: Patient at procedure or test/unavailable(Pt receiving bath at this time. SLP will follow up. )  Lakisa Lotz I. Hardin Negus, [place], [place] Office number [phone] Pager [place] 05/23/2019, 1:55 PM

## 2019-05-23 NOTE — Progress Notes (Signed)
STROKE TEAM PROGRESS NOTE   INTERVAL HISTORY She is alert, says she is doing well she denies any chest pain or difficulty.  Her troponin was elevated but she has left bundle branch block.  Cardiology fellow was contacted overnight and was not impressed.  Patient has been started on warfarin and INR is 1.6 today.  Rehab team has not yet assessed the patient but in my opinion she will be good patient for inpatient rehab.  Vitals:   05/23/19 0356 05/23/19 0905 05/23/19 0938 05/23/19 1235  BP: 113/87 114/88 115/86 106/86  Pulse: 71 85 84 65  Resp: 16 18 20  (!) 24  Temp: 98.2 F (36.8 C) 98 F (36.7 C) (!) 96.6 F (35.9 C) (!) 96.9 F (36.1 C)  TempSrc:  Oral Axillary Axillary  SpO2: 100% 100% 100% 100%  Weight:      Height:        CBC:  Recent Labs  Lab 05/19/19 1102  05/20/19 0442 05/21/19 0456  WBC 5.6  --  7.6 9.2  NEUTROABS 3.5  --  5.5  --   HGB 16.1*   < > 14.6 15.3*  HCT 48.1*   < > 43.6 45.9  MCV 85.7  --  83.4 84.4  PLT 208  --  139* 206   < > = values in this interval not displayed.    Basic Metabolic Panel:  Recent Labs  Lab 05/19/19 1700  05/21/19 0456 05/23/19 0355  NA  --    < > 140 142  K  --    < > 3.7 3.2*  CL  --    < > 112* 110  CO2  --    < > 16* 22  GLUCOSE  --    < > 112* 105*  BUN  --    < > 12 14  CREATININE  --    < > 1.04* 0.99  CALCIUM  --    < > 8.3* 8.4*  MG 1.7  --  1.7  --   PHOS  --   --  4.5  --    < > = values in this interval not displayed.   Lipid Panel:     Component Value Date/Time   CHOL 96 05/20/2019 0441   TRIG 90 05/20/2019 0441   HDL 15 (L) 05/20/2019 0441   CHOLHDL 6.4 05/20/2019 0441   VLDL 18 05/20/2019 0441   LDLCALC 63 05/20/2019 0441   HgbA1c:  Lab Results  Component Value Date   HGBA1C 7.2 (H) 05/20/2019   Urine Drug Screen:     Component Value Date/Time   LABOPIA NONE DETECTED 05/19/2019 1454   COCAINSCRNUR NONE DETECTED 05/19/2019 1454   LABBENZ NONE DETECTED 05/19/2019 1454   AMPHETMU NONE  DETECTED 05/19/2019 1454   THCU NONE DETECTED 05/19/2019 1454   LABBARB NONE DETECTED 05/19/2019 1454    Alcohol Level     Component Value Date/Time   ETH <10 05/19/2019 1102    IMAGING  Mr Brain Wo Contrast Mr Angio Head Wo Contrast Mr Angio Neck W Wo Contrast 05/20/2019 IMPRESSION:   3.5 cm region of acute infarction in the right insula and temporal lobe. Few other scattered punctate infarctions in the right MCA territory. No mass effect or hemorrhage.   Restoration of flow in the right MCA territory. No new intracranial occlusion.   Atherosclerotic plaque at both carotid bulb regions but without stenosis.   Tiny/thready right vertebral artery which could be congenital or due to acquired  right vertebral disease.   The left vertebral is large vessel that appears widely patent and normal.   Ct Cerebral Perfusion W Contrast 05/19/2019 IMPRESSION:  Nondiagnostic CT perfusion.   Dg Chest Port 1 View 05/19/2019 IMPRESSION:  Interval placement of endotracheal tube suitably position above the carina. Cardiomegaly with chronic lung changes   Dg Abd Portable 1v 05/20/2019 IMPRESSION:  The OG tube has been advanced with the side port and distal tip both in the stomach.   Dg Abd Portable 1v 05/20/2019 IMPRESSION:  The oral gastric tube has been retracted slightly. The tip projects over the region of the gastric body. The side hole projects over the GE junction.  Dg Abd Portable 1v 05/19/2019 IMPRESSION:  Orogastric tube in proximal stomach, side-port EG junction. Consolidation at the left lung base may represent developing pneumonitis.   Dg Hip Port Unilat With Pelvis 1v Right 05/19/2019 IMPRESSION:  No acute osseous injury of the right hip. Given the patient's age and osteopenia, if there is persistent clinical concern for an occult hip fracture, a CT or MRI of the hip is recommended for increased sensitivity.  DG Chest 1 View  05/19/19 IMPRESSION: Interval placement of  endotracheal tube suitably position above the carina. Cardiomegaly with chronic lung changes  DG Chest 1 View - pending 05/21/19   Cerebral Angiogram 9/24/22020 S/P RT CCA arteriogram followed by complete revascularization of RT MCA M 1 occlusion with x 1 pass with 40 mm Solitaire X retriever device achieving a TICI 3 revascularization.Marland Kitchen  PHYSICAL EXAM  Temp:  [96.6 F (35.9 C)-98.2 F (36.8 C)] 96.9 F (36.1 C) (09/28 1235) Pulse Rate:  [65-102] 65 (09/28 1235) Resp:  [16-24] 24 (09/28 1235) BP: (106-120)/(74-99) 106/86 (09/28 1235) SpO2:  [100 %] 100 % (09/28 1235)  Obese middle-aged Caucasian lady not in distress.  . Afebrile. Head is nontraumatic. Neck is supple without bruit.    Cardiac exam no murmur or gallop. Lungs are clear to auscultation. Distal pulses are well felt. Neurological Exam :  - alert, sitting up in bed, following commands, only oriented to name and says in hospital, pleasant, exophthalmos, left partial hemianopsia, eomi, face symmetric, tongue protrusion midline, antigravity on the right, left side antigravity with drift, 1+ patellars and biceps, toes equiv, w/d to pain x 4. Gait deferred  ASSESSMENT/PLAN Ms. Briannah Lona is a 63 y.o. female with history of HTN, HLD, CHF, pre-diabetes, morbid obesity, LBBB and GERD presenting to [facility] who woke with L sided weakness, L hemisensory deficit, speech difficulty and L facial droop. To [facility] for IR.   Stroke: R MCA infarct due to right M1 occlusion s/p IR with TICI3 reperfusion - embolic secondary to severe cardiomyopathy with low EF - coumadin started  CT head [facility]) no hypodensity  CTA head & neck ([place]) R MCA occlusion   CT perfusion nondiagnostic  Cerebral angio R M1 occlusion w/ TICI3 revascularization    Post IR CT no ICH or gross mass effect   MRI - 3.5 cm region of acute infarction in the right insula and temporal lobe. Few other scattered punctate infarctions in the right  MCA territory.  MRA H&N - Restoration of flow in the right MCA territory. No new intracranial occlusion. Atherosclerotic plaque at both carotid bulb regions but without stenosis. Tiny/thready right vertebral artery which could be congenital or due to acquired right vertebral disease. The left vertebral is large vessel that appears widely patent and normal.   CT Perfusion -  Non diagnostic  2D Echo EF < 20%  LE venous doppler - b/l LE edema - Preliminary report There is no evidence of deep vein thrombosis in the lower extremity.   Repeat CXR - 9/26 - Evidence of early edema with cardiomegaly.  LDL 63  HgbA1c 7.2  SCDs for VTE prophylaxis. Added Lovenox 40 mg sq daily    aspirin 81 mg daily prior to admission, now on ASA. Placed Pharm consult for coumadin anticoagulation due to low EF.  Therapy recommendations:  pending   Disposition:  pending   Chronic systolic Congestive heart failure HFrEF w/ NICM  EF < 20%  Home meds:  lasix 40, spironolactone 12.5, coreg 6.25  BNP 3910.8   B/l pedal edema  Gentle hydration, now on IVF @ 40  Cardiology consult -> called, appreciate cardiology  Will need anticoagulation for stroke prevention.  Per Cards, Coumadin preferred.  Hypertension  Home meds:  Coreg 6.25 bid . BP goal 120-140 per IR x 24h post IR . Long-term BP goal normotensive  Hyperlipidemia  Home meds:  lipitor 10  LDL 63, goal < 70  Will resume lipitor once po access - resume Lipitor please  Continue statin at discharge  Diabetes type II Uncontrolled  Home meds:  none  HgbA1c 7.2, goal < 7.0  CBGs  SSI  Dysphagia . Secondary to stroke . NPO . Speech on board . Resume home meds    Other Stroke Risk Factors  Morbid Obesity, Body mass index is 37.85 kg/m., recommend weight loss, diet and exercise as appropriate   Other Active Problems  Exophthalmos, TSH normal  R hip xray neg for fx  Creatinine - 1.04 - post contrast   Hospital day # 4   Plan mobilize out of bed.  Physical occupational therapy consults.  Inpatient rehab consults.  Transfer to inpatient rehab over the next few days when bed available and after insurance approval.  Discussed with rehab coordinator.  No family available at the bedside for discussion.  A total of 25 minutes was spent for the care of this patient, spent on counseling patient and family on different diagnostic and therapeutic options, counseling and coordination of care, riskd ans benefits of management, compliance, or risk factor reduction and education.  Antony Contras, MD  To contact Stroke Continuity provider, please refer to [email or web address] After hours, contact General Neurology

## 2019-05-23 NOTE — Progress Notes (Signed)
Dr. Rory Percy informed that pt had ST 2.4 with normal vital signs.

## 2019-05-23 NOTE — Progress Notes (Signed)
Inpatient Rehabilitation-Admissions Coordinator   IP Rehab Consult received. Noted pt has yet to be assessed by PT/OT since being extubated. Will await their assessments for current functional status prior to meeting with pt/family for CIR assessment.   Please call if questions.   Jhonnie Garner, OTR/L  Rehab Admissions Coordinator  [phone] 05/23/2019 4:25 PM

## 2019-05-23 NOTE — Progress Notes (Signed)
HS-troponin elevated at 57. K 3.2 - replacement ordered. Spoke with cardiology regarding the EKG and elevated troponin. Cardiology fellow on-call reviewed the case with me and does not want to trend troponins.  Left bundle branch block already noted on the prior EKG noted again.  Difficult to call ST elevations in the presence of left bundle branch block.  She is asymptomatic. Patient is already scheduled to be anticoagulated. Stroke team to follow in the morning.  -- Breanna Portland, MD [facility] Pager: [phone] If 7pm to 7am, please call on call as listed on AMION.

## 2019-05-23 NOTE — Progress Notes (Signed)
Cross cover overnight progress note  Informed by RN about some ST issues on central telemetry.  Ordered stat EKG. Reviewed EKG. Sinus rhythm with first-degree AV block with occasional PVCs. LBBB. Compared to prior EKG, not a drastic change.  Patient completely asymptomatic Vitals stable  Recommendations: -Cycle troponins x3 - Cardiology already on board.  Get recommendations for any further studies in the a.m. We will follow the trops.   -- Amie Portland, MD [facility] Pager: [phone] If 7pm to 7am, please call on call as listed on AMION.

## 2019-05-23 NOTE — Progress Notes (Signed)
PT Cancellation Note  Patient Details Name: Breanna Bennett MRN: [id number] DOB: May 11, 1956   Cancelled Treatment:    Reason Eval/Treat Not Completed: Other (comment) nursing assisting patient to dress/finishing working with patient- will attempt to return if time and schedule allow.    Deniece Ree PT, DPT, CBIS  Supplemental Physical Therapist [facility]    Pager [phone] Acute Rehab Office [phone]

## 2019-05-23 NOTE — Evaluation (Signed)
Speech Language Pathology Evaluation Patient Details Name: Breanna Bennett MRN: [id number] DOB: 29-Mar-1956 Today's Date: 05/23/2019 Time: 3546-5681 SLP Time Calculation (min) (ACUTE ONLY): 22 min  Problem List:  Patient Active Problem List   Diagnosis Date Noted  . Acute respiratory failure (HCC)   . Stroke (cerebrum) (HCC) 05/19/2019  . Middle cerebral artery embolism, 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  . Mitral regurgitation 05/04/2019  . Nonischemic cardiomyopathy [facility]) cardiac catheterization May 2020 showed normal coronaries, ejection fraction 30% 02/09/2019  . Chronic combined systolic and diastolic congestive heart failure, NYHA class 2 (HCC) 02/09/2019  . Acute systolic CHF (congestive heart failure) (HCC) 01/12/2019  . Essential hypertension 01/12/2019  . Class 2 severe obesity due to excess calories with serious comorbidity and body mass index (BMI) of 38.0 to 38.9 in adult (HCC) 10/19/2018  . Gastroesophageal reflux disease 10/19/2018  . Hyperlipidemia 10/19/2018   Past Medical History:  Past Medical History:  Diagnosis Date  . Chronic systolic CHF (congestive heart failure) (HCC)   . Elevated hemoglobin (HCC)   . GERD (gastroesophageal reflux disease)   . Hyperlipidemia   . Hypertension   . LBBB (left bundle branch block)   . Mild renal insufficiency   . Mitral regurgitation   . NICM (nonischemic cardiomyopathy) (HCC)   . Pre-diabetes   . Tricuspid regurgitation    Past Surgical History:  Past Surgical History:  Procedure Laterality Date  . RADIOLOGY WITH ANESTHESIA N/A 05/19/2019   Procedure: IR WITH ANESTHESIA;  Surgeon: Julieanne Cotton, MD;  Location: [facility] OR;  Service: Radiology;  Laterality: N/A;  . RIGHT/LEFT HEART CATH AND CORONARY ANGIOGRAPHY N/A 01/13/2019   Procedure: RIGHT/LEFT HEART CATH AND CORONARY ANGIOGRAPHY;  Surgeon: Runell Gess, MD;  Location: [facility] INVASIVE CV LAB;  Service: Cardiovascular;  Laterality: N/A;  . TUBAL LIGATION     HPI:  Pt is a 63 y.o.  female who transferred from [facility] ED with acute onset of left hemiplegia, left hemisensory loss and left facial droop. MRI of the brain 3.5 cm region of acute infarction in the right insula and temporal lobe. Few other scattered punctate infarctions in the right MCA territory.   Assessment / Plan / Recommendation Clinical Impression  Pt reported that she was living independently prior to admission with her husband. She stated that her daughter currently assists with her medications and money management. 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 She denied any baseline deficits or acute changes in speech, language or cognition.   The [facility] Cognitive Assessment 8.1 was completed to evaluate the pt's cognitive-linguistic skills. She achieved a score of 13/30 which is below the normal limits of 26 or more out of 30 and is suggestive of a moderate impairment. She demonstrated deficits in the areas of awareness, executive function, attention, mental manipulation, divergent naming, abstract reasoning, memory, and orientation to time. Skilled SLP services are clinically indicated at this time to improve cognition. Pt, and nursing were educated regarding results and recommendations; both parties verbalized understanding as well as agreement with plan of care.    SLP Assessment  SLP Recommendation/Assessment: Patient needs continued Speech Lanaguage Pathology Services SLP Visit Diagnosis: Cognitive communication deficit (R41.841)    Follow Up Recommendations  Inpatient Rehab    Frequency and Duration min 2x/week  2 weeks      SLP Evaluation Cognition  Overall Cognitive Status: Impaired/Different from baseline Arousal/Alertness: Awake/alert Orientation Level: Oriented to person;Oriented to situation;Disoriented to time;Oriented to place(Date: 20th; Month: October; Day: Thursday)  Attention: Focused;Sustained Focused Attention: Impaired Focused Attention  Impairment: Verbal complex(Vigilance impaired: 0/1) Sustained Attention: Impaired Sustained Attention Impairment: Verbal complex(sERIAL 7S: 0/3) Memory: Impaired Memory Impairment: Storage deficit;Retrieval deficit;Decreased recall of new information(Ijmmediate: 4/5; delayed: 3/5 with cues: 1/2) Awareness: Impaired Awareness Impairment: Emergent impairment Problem Solving: Impaired Problem Solving Impairment: Verbal complex Executive Function: Reasoning;Organizing;Sequencing Reasoning: Impaired Reasoning Impairment: Verbal complex(Abstraction: 0/2) Sequencing: Impaired Sequencing Impairment: Verbal complex(Clock drawing: 1/3) Organizing: Impaired Organizing Impairment: Verbal complex(Forward digit span: 1/1; Backward digit span: 0/1)       Comprehension  Auditory Comprehension Overall Auditory Comprehension: Appears within functional limits for tasks assessed Yes/No Questions: Within Functional Limits Commands: Impaired Complex Commands: (Trail cojmpletion: 0/1) Conversation: Complex Visual Recognition/Discrimination Discrimination: Within Function Limits Reading Comprehension Reading Status: Within funtional limits    Expression Expression Primary Mode of Expression: Verbal Verbal Expression Overall Verbal Expression: Appears within functional limits for tasks assessed Initiation: No impairment Level of Generative/Spontaneous Verbalization: Conversation Confrontation: Within functional limits(3/3) Pragmatics: No impairment Written Expression Dominant Hand: Right Written Expression: (DIfficulty copying cube: 0/1)   Oral / Motor  Oral Motor/Sensory Function Overall Oral Motor/Sensory Function: Within functional limits Motor Speech Overall Motor Speech: Appears within functional limits for tasks assessed Respiration: Within functional limits Phonation: Normal Resonance: Within functional limits Articulation: Within functional limitis Intelligibility:  Intelligible Motor Planning: Witnin functional limits Motor Speech Errors: Not applicable   Sreeja Spies I. Hardin Negus, [place], [place] Office number [phone] Pager [place] 05/23/2019, 3:45 PM

## 2019-05-23 NOTE — Progress Notes (Addendum)
Per central lab, pt's STAT lab hemolyzed. Spoke with Melissa at 0305 to ensure that lab was aware that sample is STAT and should be drawn as such. Spoke with Melissa again at [place], she stated that she would replace lab order as STAT.

## 2019-05-24 ENCOUNTER — Other Ambulatory Visit: Payer: Self-pay

## 2019-05-24 ENCOUNTER — Encounter (HOSPITAL_COMMUNITY): Payer: Self-pay

## 2019-05-24 LAB — BASIC METABOLIC PANEL
Anion gap: 10 (ref 5–15)
BUN: 14 mg/dL (ref 8–23)
CO2: 20 mmol/L — ABNORMAL LOW (ref 22–32)
Calcium: 8.4 mg/dL — ABNORMAL LOW (ref 8.9–10.3)
Chloride: 111 mmol/L (ref 98–111)
Creatinine, Ser: 0.94 mg/dL (ref 0.44–1.00)
GFR calc Af Amer: >60 mL/min (ref >60)
GFR calc non Af Amer: >60 mL/min (ref >60)
Glucose, Bld: 86 mg/dL (ref 70–99)
Potassium: 4.2 mmol/L (ref 3.5–5.1)
Sodium: 141 mmol/L (ref 135–145)

## 2019-05-24 LAB — GLUCOSE, CAPILLARY
Glucose-Capillary: 89 mg/dL (ref 70–99)
Glucose-Capillary: 88 mg/dL (ref 70–99)
Glucose-Capillary: 112 mg/dL — ABNORMAL HIGH (ref 70–99)
Glucose-Capillary: 123 mg/dL — ABNORMAL HIGH (ref 70–99)
Glucose-Capillary: 109 mg/dL — ABNORMAL HIGH (ref 70–99)
Glucose-Capillary: 128 mg/dL — ABNORMAL HIGH (ref 70–99)

## 2019-05-24 LAB — CBC
HCT: 44.1 % (ref 36.0–46.0)
Hemoglobin: 14.9 g/dL (ref 12.0–15.0)
MCH: 28.4 pg (ref 26.0–34.0)
MCHC: 33.8 g/dL (ref 30.0–36.0)
MCV: 84 fL (ref 80.0–100.0)
Platelets: 219 10*3/uL (ref 150–400)
RBC: 5.25 MIL/uL — ABNORMAL HIGH (ref 3.87–5.11)
RDW: 16.9 % — ABNORMAL HIGH (ref 11.5–15.5)
WBC: 6.3 10*3/uL (ref 4.0–10.5)
nRBC: 0 % (ref 0.0–0.2)

## 2019-05-24 LAB — PROTIME-INR
INR: 3.9 — ABNORMAL HIGH (ref 0.8–1.2)
Prothrombin Time: 37.7 seconds — ABNORMAL HIGH (ref 11.4–15.2)

## 2019-05-24 NOTE — Progress Notes (Signed)
ANTICOAGULATION CONSULT NOTE  Pharmacy Consult:  Coumadin Indication: stroke  Allergies  Allergen Reactions  . Lisinopril Cough  . Losartan Cough    Patient Measurements: Height: 5\' 8"  (172.7 cm) Weight: 248 lb 14.4 oz (112.9 kg) IBW/kg (Calculated) : 63.9  Vital Signs: Temp: 98 F (36.7 C) (09/29 1222) Temp Source: Oral (09/29 1222) BP: 116/82 (09/29 1222) Pulse Rate: 73 (09/29 1222)  Labs: Recent Labs    05/22/19 1308 05/23/19 0254 05/23/19 0355 05/24/19 0417  HGB  --   --   --  14.9  HCT  --   --   --  44.1  PLT  --   --   --  219  LABPROT 15.6* 18.6*  --  37.7*  INR 1.3* 1.6*  --  3.9*  CREATININE  --   --  0.99 0.94  TROPONINIHS  --   --  57*  --     Estimated Creatinine Clearance: 80.7 mL/min (by C-G formula based on SCr of 0.94 mg/dL).   Assessment: 53 YOF who presented on 9/24 with acute R-MCA occlusion s/p revascularization. The patient was also noted to have a low EF<20%. Pharmacy consulted to start Coumadin for CVA prophylaxis.   INR trended up significantly to supra-therapeutic level today (3.9) after 2 Coumadin doses.  Ordered at stat repeat INR around 0730 this AM and it still hasn't been collected.  No DDI identified and patient is eating up to 50% of her meals.  No bleeding reported.  Goal of Therapy:  INR 2-3 Monitor platelets by anticoagulation protocol: Yes   Plan:  Hold Coumadin today Daily PT / INR  Grier Czerwinski D. Mina Marble, PharmD, BCPS, [place] 05/24/2019, 2:01 PM

## 2019-05-24 NOTE — Progress Notes (Addendum)
STROKE TEAM PROGRESS NOTE   INTERVAL HISTORY She is alert, says she is doing well. No complaints. Patient has been started on warfarin and INR is 3.9 today.  Rehab team has not yet assessed the patient but in my opinion she will be good patient for inpatient rehab.  Vitals:   05/24/19 0415 05/24/19 0812 05/24/19 0854 05/24/19 1222  BP: 103/85 (!) 114/93 110/87 116/82  Pulse: 76 78 80 73  Resp: 20 16 18 18   Temp: 97.9 F (36.6 C) 98.7 F (37.1 C) 97.6 F (36.4 C) 98 F (36.7 C)  TempSrc: Oral Oral Oral Oral  SpO2: 99%  93% 99%  Weight:      Height:        CBC:  Recent Labs  Lab 05/19/19 1102  05/20/19 0442 05/21/19 0456 05/24/19 0417  WBC 5.6  --  7.6 9.2 6.3  NEUTROABS 3.5  --  5.5  --   --   HGB 16.1*   < > 14.6 15.3* 14.9  HCT 48.1*   < > 43.6 45.9 44.1  MCV 85.7  --  83.4 84.4 84.0  PLT 208  --  139* 206 219   < > = values in this interval not displayed.    Basic Metabolic Panel:  Recent Labs  Lab 05/19/19 1700  05/21/19 0456 05/23/19 0355 05/24/19 0417  NA  --    < > 140 142 141  K  --    < > 3.7 3.2* 4.2  CL  --    < > 112* 110 111  CO2  --    < > 16* 22 20*  GLUCOSE  --    < > 112* 105* 86  BUN  --    < > 12 14 14   CREATININE  --    < > 1.04* 0.99 0.94  CALCIUM  --    < > 8.3* 8.4* 8.4*  MG 1.7  --  1.7  --   --   PHOS  --   --  4.5  --   --    < > = values in this interval not displayed.   Lipid Panel:     Component Value Date/Time   CHOL 96 05/20/2019 0441   TRIG 90 05/20/2019 0441   HDL 15 (L) 05/20/2019 0441   CHOLHDL 6.4 05/20/2019 0441   VLDL 18 05/20/2019 0441   LDLCALC 63 05/20/2019 0441   HgbA1c:  Lab Results  Component Value Date   HGBA1C 7.2 (H) 05/20/2019   Urine Drug Screen:     Component Value Date/Time   LABOPIA NONE DETECTED 05/19/2019 1454   COCAINSCRNUR NONE DETECTED 05/19/2019 1454   LABBENZ NONE DETECTED 05/19/2019 1454   AMPHETMU NONE DETECTED 05/19/2019 1454   THCU NONE DETECTED 05/19/2019 1454   LABBARB NONE  DETECTED 05/19/2019 1454    Alcohol Level     Component Value Date/Time   ETH <10 05/19/2019 1102    IMAGING  Mr Brain Wo Contrast Mr Angio Head Wo Contrast Mr Angio Neck W Wo Contrast 05/20/2019 IMPRESSION:   3.5 cm region of acute infarction in the right insula and temporal lobe. Few other scattered punctate infarctions in the right MCA territory. No mass effect or hemorrhage.   Restoration of flow in the right MCA territory. No new intracranial occlusion.   Atherosclerotic plaque at both carotid bulb regions but without stenosis.   Tiny/thready right vertebral artery which could be congenital or due to acquired right vertebral disease.  The left vertebral is large vessel that appears widely patent and normal.   Ct Cerebral Perfusion W Contrast 05/19/2019 IMPRESSION:  Nondiagnostic CT perfusion.   Dg Chest Port 1 View 05/19/2019 IMPRESSION:  Interval placement of endotracheal tube suitably position above the carina. Cardiomegaly with chronic lung changes   Dg Abd Portable 1v 05/20/2019 IMPRESSION:  The OG tube has been advanced with the side port and distal tip both in the stomach.   Dg Abd Portable 1v 05/20/2019 IMPRESSION:  The oral gastric tube has been retracted slightly. The tip projects over the region of the gastric body. The side hole projects over the GE junction.  Dg Abd Portable 1v 05/19/2019 IMPRESSION:  Orogastric tube in proximal stomach, side-port EG junction. Consolidation at the left lung base may represent developing pneumonitis.   Dg Hip Port Unilat With Pelvis 1v Right 05/19/2019 IMPRESSION:  No acute osseous injury of the right hip. Given the patient's age and osteopenia, if there is persistent clinical concern for an occult hip fracture, a CT or MRI of the hip is recommended for increased sensitivity.  DG Chest 1 View  05/19/19 IMPRESSION: Interval placement of endotracheal tube suitably position above the carina. Cardiomegaly with chronic  lung changes  DG Chest 1 View - pending 05/21/19   Cerebral Angiogram 9/24/22020 S/P RT CCA arteriogram followed by complete revascularization of RT MCA M 1 occlusion with x 1 pass with 40 mm Solitaire X retriever device achieving a TICI 3 revascularization.Marland Kitchen  PHYSICAL EXAM  Temp:  [97.6 F (36.4 C)-98.7 F (37.1 C)] 98 F (36.7 C) (09/29 1222) Pulse Rate:  [73-84] 73 (09/29 1222) Resp:  [16-20] 18 (09/29 1222) BP: (96-116)/(73-93) 116/82 (09/29 1222) SpO2:  [93 %-99 %] 99 % (09/29 1222)  Obese middle-aged Caucasian lady not in distress.  . Afebrile. Head is nontraumatic. Neck is supple without bruit.    Cardiac exam no murmur or gallop. Lungs are clear to auscultation. Distal pulses are well felt. Neurological Exam :  - alert, sitting up in bed, following commands, only oriented to name and says in hospital, pleasant, exophthalmos, left partial hemianopsia, eomi, face symmetric, tongue protrusion midline, antigravity on the right, left side antigravity with drift, 1+ patellars and biceps, toes equiv, w/d to pain x 4. Gait deferred  ASSESSMENT/PLAN Ms. Sylvie Etheridge is a 63 y.o. female with history of HTN, HLD, CHF, pre-diabetes, morbid obesity, LBBB and GERD presenting to [facility] who woke with L sided weakness, L hemisensory deficit, speech difficulty and L facial droop. To [facility] for IR.   Stroke: R MCA infarct due to right M1 occlusion s/p IR with TICI3 reperfusion - embolic secondary to severe cardiomyopathy with low EF - coumadin started  CT head [facility]) no hypodensity  CTA head & neck ([place]) R MCA occlusion   CT perfusion nondiagnostic  Cerebral angio R M1 occlusion w/ TICI3 revascularization    Post IR CT no ICH or gross mass effect   MRI - 3.5 cm region of acute infarction in the right insula and temporal lobe. Few other scattered punctate infarctions in the right MCA territory.  MRA H&N - Restoration of flow in the right MCA territory. No new  intracranial occlusion. Atherosclerotic plaque at both carotid bulb regions but without stenosis. Tiny/thready right vertebral artery which could be congenital or due to acquired right vertebral disease. The left vertebral is large vessel that appears widely patent and normal.   CT Perfusion -  Non diagnostic  2D Echo EF <  20%  LE venous doppler - b/l LE edema - Preliminary report There is no evidence of deep vein thrombosis in the lower extremity.   Repeat CXR - 9/26 - Evidence of early edema with cardiomegaly.  LDL 63  HgbA1c 7.2  SCDs for VTE prophylaxis. Added Lovenox 40 mg sq daily    aspirin 81 mg daily prior to admission, now on ASA. Placed Pharm consult for coumadin anticoagulation due to low EF.  Therapy recommendations:  pending   Disposition:  pending   Chronic systolic Congestive heart failure HFrEF w/ NICM  EF < 20%  Home meds:  lasix 40, spironolactone 12.5, coreg 6.25  BNP 3910.8   B/l pedal edema  Gentle hydration, now on IVF @ 40  Cardiology consult -> called, appreciate cardiology  Will need anticoagulation for stroke prevention.  Per Cards, Coumadin preferred.  Hypertension  Home meds:  Coreg 6.25 bid . BP goal 120-140 per IR x 24h post IR . Long-term BP goal normotensive  Hyperlipidemia  Home meds:  lipitor 10  LDL 63, goal < 70  Will resume lipitor once po access - resume Lipitor please  Continue statin at discharge  Diabetes type II Uncontrolled  Home meds:  none  HgbA1c 7.2, goal < 7.0  CBGs  SSI  Dysphagia . Secondary to stroke . NPO . Speech on board . Resume home meds    Other Stroke Risk Factors  Morbid Obesity, Body mass index is 37.85 kg/m., recommend weight loss, diet and exercise as appropriate   Other Active Problems  Exophthalmos, TSH normal  R hip xray neg for fx  Creatinine - 1.04 - post contrast   Hospital day # 5  Plan : Continue Physical and occupational therapy Await  Inpatient rehab consult. D  aspirin as INR is optimal.Pharmacy to manange warfarin dosing. No family available at the bedside for discussion.I called and left a message on her husband`s phone to call me if he had any questions.    Antony Contras, MD  To contact Stroke Continuity provider, please refer to [email or web address] After hours, contact General Neurology

## 2019-05-24 NOTE — Progress Notes (Signed)
  Speech Language Pathology Treatment: Cognitive-Linquistic  Patient Details Name: Breanna Bennett MRN: [id number] DOB: [birth year] Today's Date: 05/24/2019 Time: 0034-9179 SLP Time Calculation (min) (ACUTE ONLY): 26 min  Assessment / Plan / Recommendation Clinical Impression  Pt was seen for cognitive-linguistic treatment and cooperative throughout the session. She recalled some of the activities from the day and the discharge plan. She identified the time on clocks with 60% accuracy increasing to 100% with mod cues. She completed a 2-word opposite working memory task with 20% accuracy increasing to 80% with mod cues and repetition. She achieved 100% accuracy with abstract reasoning and 60% accuracy with word deduction increasing to 90% with min-mod cues. SLP will continue to follow pt.    HPI HPI: Pt is a 63 y.o. female who transferred from [facility] ED with acute onset of left hemiplegia, left hemisensory loss and left facial droop. MRI of the brain 3.5 cm region of acute infarction in the right insula and temporal lobe. Few other scattered punctate infarctions in the right MCA territory.      SLP Plan  Continue with current plan of care       Recommendations                   Follow up Recommendations: Inpatient Rehab SLP Visit Diagnosis: Cognitive communication deficit (X50.569) Plan: Continue with current plan of care       Derrik Mceachern I. Hardin Negus, [place], [place] Office number [phone] Pager [place] 05/24/2019, 3:48 PM

## 2019-05-24 NOTE — Plan of Care (Signed)
  Problem: Education: Goal: Knowledge of disease or condition will improve Outcome: Progressing Goal: Knowledge of secondary prevention will improve Outcome: Progressing   

## 2019-05-24 NOTE — Progress Notes (Signed)
Inpatient Rehab Admissions:  Inpatient Rehab Consult received.  I met with pt at the bedside for rehabilitation assessment. Feel pt is a good candidate for CIR at this time. Noted pt's primary insurance is a [employer], retail where [facility] is out of network. Informed patient that her insurance benefits would most likely be better at a rehab center that is in-network. Pt would like a program in-network as she is concerned about potential cost. CM aware of current status and plans to speak with pt's family regarding other inpatient rehab programs that are available.   AC will follow at a distance.   Jhonnie Garner, OTR/L  Rehab Admissions Coordinator  [phone] 05/24/2019 2:22 PM

## 2019-05-24 NOTE — TOC Initial Note (Signed)
Transition of Care [facility]) - Initial/Assessment Note    Patient Details  Name: Breanna Bennett MRN: [id number] Date of Birth: 1955/12/19  Transition of Care [facility]) CM/SW Contact:    Pollie Friar, RN Phone Number: 05/24/2019, 4:06 PM  Clinical Narrative:                 Recommendations are for CIR. Pts insurance is not in network with [facility]. She would have a very large co pay. Kelly with IR and CM met with the patient and she is open to going to another IR that is in network with her insurance. CM also reached out to her daughter (with patients permission) and she was in agreement with [facility]. CM called and left Pam with [facility] a message and faxed her the needed information. CM awaits a return call.   Expected Discharge Plan: IP Rehab Facility Barriers to Discharge: Continued Medical Work up, Pharmacist, community is out of network with her insurance.)   Patient Goals and CMS Choice   CMS Medicare.gov Compare Post Acute Care list provided to:: Patient Represenative (must comment) Choice offered to / list presented to : Patient, Adult Children  Expected Discharge Plan and Services Expected Discharge Plan: [place]   Discharge Planning Services: CM Consult                                          Prior Living Arrangements/Services   Lives with:: Spouse, Adult Children Patient language and need for interpreter reviewed:: Yes Do you feel safe going back to the place where you live?: Yes      Need for Family Participation in Patient Care: Yes (Comment)(24 hour supervision) Care giver support system in place?: Yes (comment)(has 24 hour supervision at home with her family)   Criminal Activity/Legal Involvement Pertinent to Current Situation/Hospitalization: No - Comment as needed  Activities of Daily Living Home Assistive Devices/Equipment: Shower chair without back, Scales, Grab bars in shower, CBG Meter, Blood pressure cuff ADL Screening (condition  at time of admission) Patient's cognitive ability adequate to safely complete daily activities?: Yes Is the patient deaf or have difficulty hearing?: No Does the patient have difficulty seeing, even when wearing glasses/contacts?: No Does the patient have difficulty concentrating, remembering, or making decisions?: No Patient able to express need for assistance with ADLs?: Yes Does the patient have difficulty dressing or bathing?: Yes Independently performs ADLs?: No Communication: Needs assistance Is this a change from baseline?: Pre-admission baseline Dressing (OT): Needs assistance Is this a change from baseline?: Pre-admission baseline Grooming: Needs assistance Is this a change from baseline?: Pre-admission baseline Feeding: Independent Bathing: Needs assistance Is this a change from baseline?: Pre-admission baseline Toileting: Independent In/Out Bed: Independent Walks in Home: Independent Does the patient have difficulty walking or climbing stairs?: Yes Weakness of Legs: Left Weakness of Arms/Hands: Left  Permission Sought/Granted                  Emotional Assessment Appearance:: Appears stated age Attitude/Demeanor/Rapport: Engaged Affect (typically observed): Accepting, Pleasant Orientation: : Oriented to Self, Oriented to Place, Oriented to  Time, Oriented to Situation   Psych Involvement: No (comment)  Admission diagnosis:  Pain [R52] Stroke [facility]) [I63.9] Stroke (cerebrum) [facility]) [I63.9] Patient Active Problem List   Diagnosis Date Noted  . Acute respiratory failure ([place])   . Stroke (cerebrum) ([place]) 05/19/2019  . Middle cerebral  artery embolism, 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  . Mitral regurgitation 05/04/2019  . Nonischemic cardiomyopathy [facility]) cardiac catheterization May 2020 showed normal coronaries, ejection fraction 30% 02/09/2019  . Chronic combined systolic and diastolic congestive heart failure, NYHA class 2 ([place]) 02/09/2019  . Acute systolic CHF (congestive  heart failure) ([place]) 01/12/2019  . Essential hypertension 01/12/2019  . Class 2 severe obesity due to excess calories with serious comorbidity and body mass index (BMI) of 38.0 to 38.9 in adult ([place]) 10/19/2018  . Gastroesophageal reflux disease 10/19/2018  . Hyperlipidemia 10/19/2018   PCP:  Marisue Humble, FNP Pharmacy:   [facility] [place], [address] [place] [address] Phone: [phone] Fax: [phone]     Social Determinants of Health (SDOH) Interventions    Readmission Risk Interventions No flowsheet data found.

## 2019-05-24 NOTE — Progress Notes (Signed)
Occupational Therapy Treatment Patient Details Name: Breanna Bennett MRN: [id number] DOB: 1956-01-10 Today's Date: 05/24/2019    History of present illness 63 y.o. female admitted on 05/19/19 with L sided weakness, facial droop and sensory loss.  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  She did not recieve tPA, but did go to IR for throbectomy.  Pt intubated for IR and remains intubated post op due to failed SBT.  Pt with significant PMH of tricuspid regurgitation, pre-diabeted, mitral regurgitation, renal insufficency, LBBB, HTN, chronic systolic CHF.    OT comments  Pt making steady progress towards OT goals this session. Session focus on functional mobility and standing ADLs at sink . Pt supervision/ set-up for standing grooming at sink.  Pt able to locate all needed ADLs items on counter with no assist.Pt requires MIN guard for standing balance at sink. Noted L inattention with functional mobility this session needing assist to navigate threshold upon exiting room.  DC plan remains appropriate; feel pt would benefit from CIR level therapies to maximize functional independence to return to PLOF.  Will continue to follow for acute OT needs.    Follow Up Recommendations  CIR;Supervision/Assistance - 24 hour    Equipment Recommendations  Other (comment)(tbd to next venue)    Recommendations for Other Services      Precautions / Restrictions Precautions Precautions: Fall Precaution Comments: L side is weaker Restrictions Weight Bearing Restrictions: No       Mobility Bed Mobility               General bed mobility comments: pt OOB in chair upon arrival  Transfers Overall transfer level: Needs assistance Equipment used: None Transfers: Sit to/from Stand Sit to Stand: Min guard         General transfer comment: min guard for safety; cues for safe hand placement    Balance Overall balance assessment: Needs assistance Sitting-balance support: Feet supported Sitting  balance-Leahy Scale: Good     Standing balance support: Single extremity supported;During functional activity Standing balance-Leahy Scale: Poor                             ADL either performed or assessed with clinical judgement   ADL Overall ADL's : Needs assistance/impaired     Grooming: Standing;Wash/dry face;Supervision/safety;Set up Grooming Details (indicate cue type and reason): pt able to locate wash cloths in R visual field; suepervision for safety in standing             Lower Body Dressing: Supervision/safety;Sit to/from stand Lower Body Dressing Details (indicate cue type and reason): supervision to pull up socks from sitting in recliner Toilet Transfer: Minimal assistance;+2 for safety/equipment;Ambulation Toilet Transfer Details (indicate cue type and reason): simulated toilet transfer with MIN A + 2 for safety         Functional mobility during ADLs: Minimal assistance;+2 for safety/equipment;Cane;Rolling walker General ADL Comments: pt supervision/ set- up for standing ADLs at sink; pt able to locate all needed items on counter w/o cues     Vision   Vision Assessment?: Vision impaired- to be further tested in functional context Additional Comments: pt R visual neglect seems to be improved- noted L neglect during funcitonal mobility as pt running into L wall   Perception     Praxis      Cognition Arousal/Alertness: Awake/alert Behavior During Therapy: WFL for tasks assessed/performed Overall Cognitive Status: No family/caregiver present to determine baseline cognitive functioning Area of  Impairment: Safety/judgement;Problem solving                         Safety/Judgement: Decreased awareness of safety   Problem Solving: Requires verbal cues;Difficulty sequencing General Comments: L inattention noted at times espeically when navigating out of room        Exercises     Shoulder Instructions       General Comments spO2  and HR WNL with mobility    Pertinent Vitals/ Pain       Pain Assessment: No/denies pain  Home Living                                          Prior Functioning/Environment              Frequency  Min 2X/week        Progress Toward Goals  OT Goals(current goals can now be found in the care plan section)  Progress towards OT goals: Progressing toward goals  Acute Rehab OT Goals Patient Stated Goal: daughter wants her to rehab as much as she can. OT Goal Formulation: With patient/family Time For Goal Achievement: 06/03/19 Potential to Achieve Goals: Good  Plan      Co-evaluation    PT/OT/SLP Co-Evaluation/Treatment: Yes Reason for Co-Treatment: Necessary to address cognition/behavior during functional activity;For patient/therapist safety;To address functional/ADL transfers PT goals addressed during session: Mobility/safety with mobility OT goals addressed during session: ADL's and self-care      AM-PAC OT "6 Clicks" Daily Activity     Outcome Measure     Help from another person taking care of personal grooming?: A Little Help from another person toileting, which includes using toliet, bedpan, or urinal?: A Little Help from another person bathing (including washing, rinsing, drying)?: A Little Help from another person to put on and taking off regular upper body clothing?: A Little Help from another person to put on and taking off regular lower body clothing?: A Little 6 Click Score: 15    End of Session Equipment Utilized During Treatment: Gait belt;Other (comment);Rolling walker(trialed SPC)  OT Visit Diagnosis: Muscle weakness (generalized) (M62.81);History of falling (Z91.81);Other symptoms and signs involving cognitive function;Other symptoms and signs involving the nervous system (R29.898)   Activity Tolerance Patient tolerated treatment well   Patient Left in chair;with call bell/phone within reach;with chair alarm set   Nurse  Communication          Time: [phone] OT Time Calculation (min): 25 min  Charges: OT General Charges $OT Visit: 1 Visit OT Treatments $Self Care/Home Management : 8-22 mins  [name], COTA/L Acute Rehabilitation Services [phone]    [name] 05/24/2019, 2:07 PM

## 2019-05-24 NOTE — Progress Notes (Signed)
Physical Therapy Treatment Patient Details Name: Breanna Bennett MRN: [id number] DOB: 05/12/1956 Today's Date: 05/24/2019    History of Present Illness 63 y.o. female admitted on 05/19/19 with L sided weakness, facial droop and sensory loss.  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  She did not recieve tPA, but did go to IR for throbectomy.  Pt intubated for IR and remains intubated post op due to failed SBT.  Pt with significant PMH of tricuspid regurgitation, pre-diabeted, mitral regurgitation, renal insufficency, LBBB, HTN, chronic systolic CHF.     PT Comments    Patient seen for mobility progression. Pt is making progress toward PT goals and tolerated mobility well with SpO2 and HR WNL while mobilizing. Pt does demonstrate some cognitive/balance deficits and pt requires min A for gait training of 75 ft this session.  Continue to progress as tolerated and recommend CIR level therapies to maximize independence and safety with mobility.    Follow Up Recommendations  CIR     Equipment Recommendations  3in1 (PT);Other (comment)(wide/bariatric)    Recommendations for Other Services Rehab consult     Precautions / Restrictions Precautions Precautions: Fall Precaution Comments: L side is weaker Restrictions Weight Bearing Restrictions: No    Mobility  Bed Mobility               General bed mobility comments: pt OOB in chair upon arrival  Transfers Overall transfer level: Needs assistance Equipment used: None Transfers: Sit to/from Stand Sit to Stand: Min guard         General transfer comment: min guard for safety; cues for safe hand placement  Ambulation/Gait Ambulation/Gait assistance: Min assist Gait Distance (Feet): 75 Feet Assistive device: 1 person hand held assist;2 person hand held assist;Straight cane Gait Pattern/deviations: Step-through pattern;Decreased stride length;Wide base of support Gait velocity: decreased   General Gait Details: initially  ambulating with +2 HHA and then with +1 HHA and then with [facility] however pt requested to use rail in hallway instead of SPC; assistance required to steady; cues for sequencing with use of AD   Stairs             Wheelchair Mobility    Modified Rankin (Stroke Patients Only) Modified Rankin (Stroke Patients Only) Pre-Morbid Rankin Score: No significant disability Modified Rankin: Moderately severe disability     Balance Overall balance assessment: Needs assistance   Sitting balance-Leahy Scale: Good     Standing balance support: Single extremity supported;During functional activity Standing balance-Leahy Scale: Poor                              Cognition Arousal/Alertness: Awake/alert Behavior During Therapy: WFL for tasks assessed/performed Overall Cognitive Status: No family/caregiver present to determine baseline cognitive functioning Area of Impairment: Safety/judgement;Problem solving                         Safety/Judgement: Decreased awareness of safety   Problem Solving: Requires verbal cues;Difficulty sequencing General Comments: L inattention noted at times espeically when navigating out of room      Exercises      General Comments General comments (skin integrity, edema, etc.): SpO2 and HR WNL with mobility      Pertinent Vitals/Pain Pain Assessment: No/denies pain    Home Living                      Prior Function  PT Goals (current goals can now be found in the care plan section) Progress towards PT goals: Progressing toward goals    Frequency    Min 4X/week      PT Plan Current plan remains appropriate    Co-evaluation PT/OT/SLP Co-Evaluation/Treatment: Yes Reason for Co-Treatment: Necessary to address cognition/behavior during functional activity;For patient/therapist safety;To address functional/ADL transfers PT goals addressed during session: Mobility/safety with mobility         AM-PAC PT "6 Clicks" Mobility   Outcome Measure  Help needed turning from your back to your side while in a flat bed without using bedrails?: A Little Help needed moving from lying on your back to sitting on the side of a flat bed without using bedrails?: A Little Help needed moving to and from a bed to a chair (including a wheelchair)?: A Little Help needed standing up from a chair using your arms (e.g., wheelchair or bedside chair)?: A Little Help needed to walk in hospital room?: A Little Help needed climbing 3-5 steps with a railing? : A Lot 6 Click Score: 17    End of Session Equipment Utilized During Treatment: Gait belt Activity Tolerance: Patient tolerated treatment well Patient left: with call bell/phone within reach;in chair;with chair alarm set Nurse Communication: Mobility status PT Visit Diagnosis: Muscle weakness (generalized) (M62.81);Difficulty in walking, not elsewhere classified (R26.2);Hemiplegia and hemiparesis Hemiplegia - Right/Left: Left Hemiplegia - dominant/non-dominant: Non-dominant Hemiplegia - caused by: Cerebral infarction     Time: 7867-6720 PT Time Calculation (min) (ACUTE ONLY): 25 min  Charges:  $Gait Training: 8-22 mins                     Earney Navy, PTA Acute Rehabilitation Services Pager: [phone] Office: [phone]     Darliss Cheney 05/24/2019, 1:00 PM

## 2019-05-25 LAB — PROTIME-INR
INR: 4.6 (ref 0.8–1.2)
Prothrombin Time: 42.5 seconds — ABNORMAL HIGH (ref 11.4–15.2)

## 2019-05-25 LAB — BASIC METABOLIC PANEL
Anion gap: 8 (ref 5–15)
BUN: 13 mg/dL (ref 8–23)
CO2: 21 mmol/L — ABNORMAL LOW (ref 22–32)
Calcium: 8.6 mg/dL — ABNORMAL LOW (ref 8.9–10.3)
Chloride: 109 mmol/L (ref 98–111)
Creatinine, Ser: 1 mg/dL (ref 0.44–1.00)
GFR calc Af Amer: >60 mL/min (ref >60)
GFR calc non Af Amer: 60 mL/min — ABNORMAL LOW (ref >60)
Glucose, Bld: 91 mg/dL (ref 70–99)
Potassium: 4.6 mmol/L (ref 3.5–5.1)
Sodium: 138 mmol/L (ref 135–145)

## 2019-05-25 LAB — GLUCOSE, CAPILLARY
Glucose-Capillary: 106 mg/dL — ABNORMAL HIGH (ref 70–99)
Glucose-Capillary: 129 mg/dL — ABNORMAL HIGH (ref 70–99)
Glucose-Capillary: 130 mg/dL — ABNORMAL HIGH (ref 70–99)
Glucose-Capillary: 133 mg/dL — ABNORMAL HIGH (ref 70–99)
Glucose-Capillary: 90 mg/dL (ref 70–99)
Glucose-Capillary: 98 mg/dL (ref 70–99)

## 2019-05-25 NOTE — Progress Notes (Signed)
STROKE TEAM PROGRESS NOTE   INTERVAL HISTORY She is alert, says she is doing well. No complaints. Patient has been started on warfarin and INR is 4.6 today.  Patient's insurance will not let her go to rehab at: And are awaiting decision from [facility] rehab.  Vitals:   05/24/19 2317 05/25/19 0419 05/25/19 0736 05/25/19 1114  BP: 113/68 103/79 111/85 (!) 127/96  Pulse: 84 71 86 79  Resp: 18 16 20 20   Temp: 98.1 F (36.7 C) (!) 97.4 F (36.3 C) (!) 97.4 F (36.3 C) 97.6 F (36.4 C)  TempSrc: Oral Oral Oral   SpO2: 93% 100% 100% 100%  Weight:      Height:        CBC:  Recent Labs  Lab 05/19/19 1102  05/20/19 0442 05/21/19 0456 05/24/19 0417  WBC 5.6  --  7.6 9.2 6.3  NEUTROABS 3.5  --  5.5  --   --   HGB 16.1*   < > 14.6 15.3* 14.9  HCT 48.1*   < > 43.6 45.9 44.1  MCV 85.7  --  83.4 84.4 84.0  PLT 208  --  139* 206 219   < > = values in this interval not displayed.    Basic Metabolic Panel:  Recent Labs  Lab 05/19/19 1700  05/21/19 0456  05/24/19 0417 05/25/19 0440  NA  --    < > 140   < > 141 138  K  --    < > 3.7   < > 4.2 4.6  CL  --    < > 112*   < > 111 109  CO2  --    < > 16*   < > 20* 21*  GLUCOSE  --    < > 112*   < > 86 91  BUN  --    < > 12   < > 14 13  CREATININE  --    < > 1.04*   < > 0.94 1.00  CALCIUM  --    < > 8.3*   < > 8.4* 8.6*  MG 1.7  --  1.7  --   --   --   PHOS  --   --  4.5  --   --   --    < > = values in this interval not displayed.   Lipid Panel:     Component Value Date/Time   CHOL 96 05/20/2019 0441   TRIG 90 05/20/2019 0441   HDL 15 (L) 05/20/2019 0441   CHOLHDL 6.4 05/20/2019 0441   VLDL 18 05/20/2019 0441   LDLCALC 63 05/20/2019 0441   HgbA1c:  Lab Results  Component Value Date   HGBA1C 7.2 (H) 05/20/2019   Urine Drug Screen:     Component Value Date/Time   LABOPIA NONE DETECTED 05/19/2019 1454   COCAINSCRNUR NONE DETECTED 05/19/2019 1454   LABBENZ NONE DETECTED 05/19/2019 1454   AMPHETMU NONE DETECTED  05/19/2019 1454   THCU NONE DETECTED 05/19/2019 1454   LABBARB NONE DETECTED 05/19/2019 1454    Alcohol Level     Component Value Date/Time   ETH <10 05/19/2019 1102    IMAGING  Mr Brain Wo Contrast Mr Angio Head Wo Contrast Mr Angio Neck W Wo Contrast 05/20/2019 IMPRESSION:   3.5 cm region of acute infarction in the right insula and temporal lobe. Few other scattered punctate infarctions in the right MCA territory. No mass effect or hemorrhage.   Restoration of flow  in the right MCA territory. No new intracranial occlusion.   Atherosclerotic plaque at both carotid bulb regions but without stenosis.   Tiny/thready right vertebral artery which could be congenital or due to acquired right vertebral disease.   The left vertebral is large vessel that appears widely patent and normal.   Ct Cerebral Perfusion W Contrast 05/19/2019 IMPRESSION:  Nondiagnostic CT perfusion.   Dg Chest Port 1 View 05/19/2019 IMPRESSION:  Interval placement of endotracheal tube suitably position above the carina. Cardiomegaly with chronic lung changes   Dg Abd Portable 1v 05/20/2019 IMPRESSION:  The OG tube has been advanced with the side port and distal tip both in the stomach.   Dg Abd Portable 1v 05/20/2019 IMPRESSION:  The oral gastric tube has been retracted slightly. The tip projects over the region of the gastric body. The side hole projects over the GE junction.  Dg Abd Portable 1v 05/19/2019 IMPRESSION:  Orogastric tube in proximal stomach, side-port EG junction. Consolidation at the left lung base may represent developing pneumonitis.   Dg Hip Port Unilat With Pelvis 1v Right 05/19/2019 IMPRESSION:  No acute osseous injury of the right hip. Given the patient's age and osteopenia, if there is persistent clinical concern for an occult hip fracture, a CT or MRI of the hip is recommended for increased sensitivity.  DG Chest 1 View  05/19/19 IMPRESSION: Interval placement of  endotracheal tube suitably position above the carina. Cardiomegaly with chronic lung changes  DG Chest 1 View - pending 05/21/19   Cerebral Angiogram 9/24/22020 S/P RT CCA arteriogram followed by complete revascularization of RT MCA M 1 occlusion with x 1 pass with 40 mm Solitaire X retriever device achieving a TICI 3 revascularization.Marland Kitchen  PHYSICAL EXAM  Temp:  [97.4 F (36.3 C)-98.1 F (36.7 C)] 97.6 F (36.4 C) (09/30 1114) Pulse Rate:  [71-86] 79 (09/30 1114) Resp:  [16-20] 20 (09/30 1114) BP: (101-127)/(68-96) 127/96 (09/30 1114) SpO2:  [93 %-100 %] 100 % (09/30 1114)  Obese middle-aged Caucasian lady not in distress.  . Afebrile. Head is nontraumatic. Neck is supple without bruit.    Cardiac exam no murmur or gallop. Lungs are clear to auscultation. Distal pulses are well felt. Neurological Exam :  - alert, sitting up in bed, following commands, only oriented to name and says in hospital, pleasant, exophthalmos, left partial hemianopsia, eomi, face symmetric, tongue protrusion midline, antigravity on the right, left side antigravity with drift, 1+ patellars and biceps, toes equiv, w/d to pain x 4. Gait deferred  ASSESSMENT/PLAN Breanna Bennett is a 63 y.o. female with history of HTN, HLD, CHF, pre-diabetes, morbid obesity, LBBB and GERD presenting to [facility] who woke with L sided weakness, L hemisensory deficit, speech difficulty and L facial droop. To [facility] for IR.   Stroke: R MCA infarct due to right M1 occlusion s/p IR with TICI3 reperfusion - embolic secondary to severe cardiomyopathy with low EF - coumadin started  CT head [facility]) no hypodensity  CTA head & neck ([place]) R MCA occlusion   CT perfusion nondiagnostic  Cerebral angio R M1 occlusion w/ TICI3 revascularization    Post IR CT no ICH or gross mass effect   MRI - 3.5 cm region of acute infarction in the right insula and temporal lobe. Few other scattered punctate infarctions in the  right MCA territory.  MRA H&N - Restoration of flow in the right MCA territory. No new intracranial occlusion. Atherosclerotic plaque at both carotid bulb regions but without stenosis.  Tiny/thready right vertebral artery which could be congenital or due to acquired right vertebral disease. The left vertebral is large vessel that appears widely patent and normal.   CT Perfusion -  Non diagnostic  2D Echo EF < 20%  LE venous doppler - b/l LE edema - Preliminary report There is no evidence of deep vein thrombosis in the lower extremity.   Repeat CXR - 9/26 - Evidence of early edema with cardiomegaly.  LDL 63  HgbA1c 7.2  SCDs for VTE prophylaxis. Added Lovenox 40 mg sq daily    aspirin 81 mg daily prior to admission, now on ASA. Placed Pharm consult for coumadin anticoagulation due to low EF.  Therapy recommendations:  pending   Disposition:  pending   Chronic systolic Congestive heart failure HFrEF w/ NICM  EF < 20%  Home meds:  lasix 40, spironolactone 12.5, coreg 6.25  BNP 3910.8   B/l pedal edema  Gentle hydration, now on IVF @ 40  Cardiology consult -> called, appreciate cardiology  Will need anticoagulation for stroke prevention.  Per Cards, Coumadin preferred.  Hypertension  Home meds:  Coreg 6.25 bid . BP goal 120-140 per IR x 24h post IR . Long-term BP goal normotensive  Hyperlipidemia  Home meds:  lipitor 10  LDL 63, goal < 70  Will resume lipitor once po access - resume Lipitor please  Continue statin at discharge  Diabetes type II Uncontrolled  Home meds:  none  HgbA1c 7.2, goal < 7.0  CBGs  SSI  Dysphagia . Secondary to stroke . NPO . Speech on board . Resume home meds    Other Stroke Risk Factors  Morbid Obesity, Body mass index is 37.85 kg/m., recommend weight loss, diet and exercise as appropriate   Other Active Problems  Exophthalmos, TSH normal  R hip xray neg for fx  Creatinine - 1.04 - post contrast   Hospital day  # 6  Plan : Continue Physical and occupational therapy Await  Inpatient rehab transfer to [facility] after insurance approval and bed availability.  INR is supratherapeutic.[facility]. No family available at the bedside for discussion     Delia Heady, MD  To contact Stroke Continuity provider, please refer to [email or web address]. After hours, contact General Neurology

## 2019-05-25 NOTE — Discharge Summary (Addendum)
Stroke Discharge Summary  Patient ID: Breanna Bennett   MRN: [id number]      DOB: 12-29-1955  Date of Admission: 05/19/2019 Date of Discharge: 05/30/2019  Attending Physician:  Marvel Plan, MD, Stroke MD Consultant(s):   Chevis Pretty, MD (IM), Dina Rich, MD (cardiology) Patient's PCP:  Laurel Dimmer, FNP  Discharge Diagnoses:  Principal Problem:   Stroke (cerebrum) (HCC) - R MCA infarct s/p mechanical thrombectomy Active Problems:   Acute systolic CHF (congestive heart failure) (HCC)   Essential hypertension   Class 2 severe obesity due to excess calories with serious comorbidity and body mass index (BMI) of 38.0 to 38.9 in adult [facility])   Hyperlipidemia   Middle cerebral artery embolism, right   Acute respiratory failure (HCC)   Type 2 diabetes mellitus, uncontrolled (HCC)  Past Medical History:  Diagnosis Date  . Chronic systolic CHF (congestive heart failure) (HCC)   . Elevated hemoglobin (HCC)   . GERD (gastroesophageal reflux disease)   . Hyperlipidemia   . Hypertension   . LBBB (left bundle branch block)   . Mild renal insufficiency   . Mitral regurgitation   . NICM (nonischemic cardiomyopathy) (HCC)   . Pre-diabetes   . Tricuspid regurgitation    Past Surgical History:  Procedure Laterality Date  . IR ANGIO VERTEBRAL SEL SUBCLAVIAN INNOMINATE UNI R MOD SED  05/19/2019  . IR CT HEAD LTD  05/19/2019  . IR PERCUTANEOUS ART THROMBECTOMY/INFUSION INTRACRANIAL INC DIAG ANGIO  05/19/2019  . RADIOLOGY WITH ANESTHESIA N/A 05/19/2019   Procedure: IR WITH ANESTHESIA;  Surgeon: Julieanne Cotton, MD;  Location: [facility] OR;  Service: Radiology;  Laterality: N/A;  . RIGHT/LEFT HEART CATH AND CORONARY ANGIOGRAPHY N/A 01/13/2019   Procedure: RIGHT/LEFT HEART CATH AND CORONARY ANGIOGRAPHY;  Surgeon: Runell Gess, MD;  Location: [facility] INVASIVE CV LAB;  Service: Cardiovascular;  Laterality: N/A;  . TUBAL LIGATION      Medications to be continued on  Rehab Allergies as of 05/30/2019      Reactions   Lisinopril Cough   Losartan Cough      Medication List    STOP taking these medications   ASPIRIN 81 PO     TAKE these medications   albuterol 108 (90 Base) MCG/ACT inhaler Commonly known as: VENTOLIN HFA Inhale 2 puffs into the lungs every 4 (four) hours as needed for shortness of breath.   atorvastatin 10 MG tablet Commonly known as: LIPITOR Take 10 mg by mouth daily.   carvedilol 6.25 MG tablet Commonly known as: COREG TAKE 1 TABLET(6.25 MG) BY MOUTH TWICE DAILY WITH A MEAL What changed: See the new instructions.   famotidine 40 MG tablet Commonly known as: PEPCID Take 40 mg by mouth at bedtime.   fluticasone 50 MCG/ACT nasal spray Commonly known as: FLONASE Place 2 sprays into both nostrils daily as needed for allergies or rhinitis.   furosemide 40 MG tablet Commonly known as: LASIX Take 1 tablet (40 mg total) by mouth daily. What changed: when to take this   loratadine 10 MG tablet Commonly known as: CLARITIN Take 10 mg by mouth daily as needed for allergies or rhinitis.   montelukast 10 MG tablet Commonly known as: SINGULAIR Take 10 mg by mouth at bedtime.   spironolactone 25 MG tablet Commonly known as: ALDACTONE Take 0.5 tablets (12.5 mg total) by mouth daily.   Vitamin D (Ergocalciferol) 1.25 MG (50000 UT) Caps capsule Commonly known as: DRISDOL Take 50,000 Units by mouth every Monday.  warfarin 2 MG tablet Commonly known as: COUMADIN Take 1 tablet (2 mg total) by mouth one time only at 6 PM. Continue daily dosing at [facility] rehab for goal INR 2-3.       LABORATORY STUDIES CBC    Component Value Date/Time   WBC 5.4 05/30/2019 0952   RBC 5.64 (H) 05/30/2019 0952   HGB 15.9 (H) 05/30/2019 0952   HCT 48.2 (H) 05/30/2019 0952   PLT 262 05/30/2019 0952   MCV 85.5 05/30/2019 0952   MCH 28.2 05/30/2019 0952   MCHC 33.0 05/30/2019 0952   RDW 18.2 (H) 05/30/2019 0952   LYMPHSABS 1.2 05/20/2019  0442   MONOABS 0.8 05/20/2019 0442   EOSABS 0.0 05/20/2019 0442   BASOSABS 0.0 05/20/2019 0442   CMP    Component Value Date/Time   NA 138 05/29/2019 0658   NA 142 05/04/2019 1503   K 3.9 05/29/2019 1518   CL 104 05/29/2019 0658   CO2 19 (L) 05/29/2019 0658   GLUCOSE 118 (H) 05/29/2019 0658   BUN 15 05/29/2019 0658   BUN 17 05/04/2019 1503   CREATININE 1.15 (H) 05/29/2019 0658   CALCIUM 8.9 05/29/2019 0658   PROT 6.1 (L) 05/19/2019 1102   ALBUMIN 3.1 (L) 05/19/2019 1102   AST 27 05/19/2019 1102   ALT 21 05/19/2019 1102   ALKPHOS 119 05/19/2019 1102   BILITOT 1.8 (H) 05/19/2019 1102   GFRNONAA 51 (L) 05/29/2019 0658   GFRAA 59 (L) 05/29/2019 0658   COAGS Lab Results  Component Value Date   INR 1.9 (H) 05/30/2019   INR 2.1 (H) 05/29/2019   INR 2.1 (H) 05/28/2019   Lipid Panel    Component Value Date/Time   CHOL 96 05/20/2019 0441   TRIG 90 05/20/2019 0441   HDL 15 (L) 05/20/2019 0441   CHOLHDL 6.4 05/20/2019 0441   VLDL 18 05/20/2019 0441   LDLCALC 63 05/20/2019 0441   HgbA1C  Lab Results  Component Value Date   HGBA1C 7.2 (H) 05/20/2019   Urinalysis    Component Value Date/Time   COLORURINE YELLOW 05/19/2019 1454   APPEARANCEUR CLEAR 05/19/2019 1454   LABSPEC >1.046 (H) 05/19/2019 1454   PHURINE 5.0 05/19/2019 1454   GLUCOSEU NEGATIVE 05/19/2019 1454   HGBUR NEGATIVE 05/19/2019 1454   BILIRUBINUR NEGATIVE 05/19/2019 1454   KETONESUR NEGATIVE 05/19/2019 1454   PROTEINUR NEGATIVE 05/19/2019 1454   NITRITE POSITIVE (A) 05/19/2019 1454   LEUKOCYTESUR NEGATIVE 05/19/2019 1454   Urine Drug Screen     Component Value Date/Time   LABOPIA NONE DETECTED 05/19/2019 1454   COCAINSCRNUR NONE DETECTED 05/19/2019 1454   LABBENZ NONE DETECTED 05/19/2019 1454   AMPHETMU NONE DETECTED 05/19/2019 1454   THCU NONE DETECTED 05/19/2019 1454   LABBARB NONE DETECTED 05/19/2019 1454    Alcohol Level    Component Value Date/Time   ETH <10 05/19/2019 1102      SIGNIFICANT DIAGNOSTIC STUDIES CT Perfusion 9/24/22020 Nondiagnostic CT perfusion.  Cerebral Angiogram 9/24/22020 S/P RT CCA arteriogram followed by complete revascularization of RT MCA M 1 occlusion with x 1 pass with 40 mm Solitaire X retriever device achieving a TICI 3 revascularization..  Mr Brain 75 Contrast Mr Angio Head Wo Contrast Mr Angio Neck W Wo Contrast 05/20/2019  3.5 cm region of acute infarction in the right insula and temporal lobe. Few other scattered punctate infarctions in the right MCA territory. No mass effect or hemorrhage.   Restoration of flow in the right MCA territory. No new  intracranial occlusion.   Atherosclerotic plaque at both carotid bulb regions but without stenosis.   Tiny/thready right vertebral artery which could be congenital or due to acquired right vertebral disease.   The left vertebral is large vessel that appears widely patent and normal.   Ct Cerebral Perfusion W Contrast 05/19/2019 Nondiagnostic CT perfusion.   Dg Abd Portable 1v 05/20/2019 The OG tube has been advanced with the side port and distal tip both in the stomach.  05/20/2019 The oral gastric tube has been retracted slightly. The tip projects over the region of the gastric body. The side hole projects over the GE junction. 05/19/2019 Orogastric tube in proximal stomach, side-port EG junction. Consolidation at the left lung base may represent developing pneumonitis.   Dg Hip Port Unilat With Pelvis 1v Right 05/19/2019 No acute osseous injury of the right hip. Given the patient's age and osteopenia, if there is persistent clinical concern for an occult hip fracture, a CT or MRI of the hip is recommended for increased sensitivity.  DG Chest 1 View  05/28/2019 Subtle left base/retrocardiac density likely atelectasis and unchanged. Possible small amount left pleural fluid unchanged. Stable moderate cardiomegaly 05/21/19 Evidence of early edema with cardiomegaly.  Likely trace pleural effusions. Interval removal of the endotracheal tube  05/19/19 Interval placement of endotracheal tube suitably position above the carina. Cardiomegaly with chronic lung changes  2D Echocardiogram 1. Left ventricular ejection fraction, by visual estimation, is <20%. The left ventricle has severely decreased function. Severely increased left ventricular size. There is no left ventricular hypertrophy.  2. Definity contrast agent was given IV to delineate the left ventricular endocardial borders.  3. Left ventricular diastolic Doppler parameters are indeterminate pattern of LV diastolic filling.  4. Global right ventricle has severely reduced systolic function.The right ventricular size is normal. No increase in right ventricular wall thickness.  5. Left atrial size was severely dilated.  6. Right atrial size was normal.  7. . Moderate mitral valve regurgitation.  8. Tricuspid valve regurgitation moderate.  9. Pulmonic valve regurgitation is moderate by color flow Doppler. 10. Moderately elevated pulmonary artery systolic pressure.  LE Doppler   Right: There is no evidence of deep vein thrombosis in the lower extremity. However, portions of this examination were limited- see technologist comments above. No cystic structure found in the popliteal fossa. Left: There is no evidence of deep vein thrombosis in the lower extremity. However, portions of this examination were limited- see technologist comments above. No cystic structure found in the popliteal fossa.    HISTORY OF PRESENT ILLNESS Candace CruiseBeverly Avans is an 63 y.o. female presenting in [facility] ED with acute onset of left hemiplegia, left hemisensory loss and left facial droop. LKW was 9 PM last night 05/18/2019 prior to going to sleep. Symptoms were present on awakening. She was evaluated by Teleneurology at [facility] and was not a candidate for tPA based on presentation outside the  window. CTA of head and neck revealed a right MCA occlusion. 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 NIHSS was 8 at [facility]. [facility] for [facility] perfusion study and possible clot retrieval with IR. Her PMHx includes chronic systolic CHF, HLD, HTN, NICM, morbid obesity and pre-diabetes.  Home meds include ASA and atorvastatin.   HOSPITAL COURSE  Ms. Candace CruiseBeverly Glover is a 63 y.o. female with history  of HTN, HLD, CHF, pre-diabetes, morbid obesity, LBBB and GERD presenting to [facility] who woke with L sided weakness, L hemisensory deficit, speech difficulty and L facial droop. To [facility] for IR.   Stroke: R MCA infarct due to right M1 occlusion s/p IR with TICI3 reperfusion - embolic secondary to severe cardiomyopathy with low EF  CT head [facility] no hypodensity  CTA head & neck ([place]) R MCA occlusion   CT perfusion nondiagnostic  Cerebral angio R M1 occlusion w/ TICI3 revascularization    Post IR CT no ICH or gross mass effect   MRI - 3.5 cm region of acute infarction in the right insula and temporal lobe. Few other scattered punctate infarctions in the right MCA territory.  MRA H&N - Restoration of flow to R MCA territory. No new intracranial occlusion. Atherosclerotic plaque B ICA bulb. Tiny/thready R VA   2D Echo EF < 20%  LE venous doppler - b/l LE edema - no deep vein thrombosis in the lower extremity.   Repeat CXR - 9/26 - Evidence of early edema with cardiomegaly.  LDL 63  HgbA1c 7.2  aspirin 81 mg daily prior to admission, on warfarin due to low EF. Goal INR 2-3  Therapy recommendations:  CIR - [facility] CIR is not in her insurance network  Disposition:  CIR [facility]  Chronic systolic Congestive heart failure HFrEF w/ NICM  EF < 20%  Home meds:  lasix 40, spironolactone 12.5, coreg  6.25  BNP 3910.8   B/l pedal edema  Cardiology consulted  anticoagulation for secondary stroke prevention.  Per Cards, Coumadin preferred.  On coumadin INR goal 2-3  Pharmacy recommends 2 mg tonight 10/5  Last INR 2.1->1.9  Hypertension  Home meds:  Coreg 6.25 bid  BP mildly low c/w low EF  On lasix 40, spironolactone 12.5, coreg 6.25  BP goal normotensive  Hyperlipidemia  Home meds:  lipitor 10  LDL 63, goal < 70  Resumed lipitor 10 mg daily  Continue statin at discharge  Diabetes type II Uncontrolled  Home meds:  none  HgbA1c 7.2, goal < 7.0  Dysphagia, resolved  Secondary to stroke  Cleared for heart healthy thin liquids   Other Stroke Risk Factors  Obesity, Body mass index is 37.85 kg/m. recommend weight loss, diet and exercise as appropriate   Other Active Problems  Exophthalmos, TSH normal  R hip xray neg for fx  Creatinine - 1.15  DISCHARGE EXAM Blood pressure 95/78, pulse 75, temperature 97.6 F (36.4 C), temperature source Oral, resp. rate 18, height 5\' 8"  (1.727 m), weight 112.9 kg, SpO2 100 %. Obese middle-aged Caucasian lady not in distress. Afebrile. Head is nontraumatic. Neck is supple without bruit. Cardiac exam no murmur or gallop. Lungs are clear to auscultation. Distal pulses are well felt. Neurological Exam : Awake alert, walking in room with walker, following commands, oriented to name and place, wrong on month, no aphasia, no dysarthria. Exophthalmos, left partial hemianopsia, EOMI, face symmetric, tongue protrusion midline, moving all extremities equally, 1+ patellars and biceps, toes equiv. Sensation symmetrical. FTN intact. normal station on standing, able to walk with walker, no fall tendency, slow stride but stable, no hemiparetic gait.  Discharge Diet  Heart healthy thin liquids    DISCHARGE PLAN  Disposition:  Transfer to [facility] for ongoing PT, OT and ST  warfarin daily for  secondary stroke prevention, goal INR 2-2 - continue dosing  Recommend ongoing stroke risk factor control by Primary Care  Physician at time of discharge from inpatient rehabilitation.  Follow-up Kruppenbach, Katherine H, FNP in 2 weeks following discharge from rehab.  Follow-up in [facility] in 4 weeks following discharge from rehab, office to schedule an appointment.   40 minutes were spent preparing discharge.  Rosalin Hawking, MD PhD Stroke Neurology 05/30/2019 7:38 PM

## 2019-05-25 NOTE — TOC Progression Note (Signed)
Transition of Care [facility]) - Progression Note    Patient Details  Name: Quiera Diffee MRN: [id number] Date of Birth: [birth year]  Transition of [facility]) CM/SW Contact  Pollie Friar, RN Phone Number: 05/25/2019, 12:41 PM  Clinical Narrative:    Damaris Schooner with Freda Munro at [facility] inpatient rehab and they requested more information be faxed. They will then start authorization through [facility].  TOC following.   Expected Discharge Plan: IP Rehab Facility Barriers to Discharge: Continued Medical Work up, Pharmacist, community is out of network with her insurance.)  Expected Discharge Plan and Services Expected Discharge Plan: [employer] manager Services: CM Consult                                           Social Determinants of Health (SDOH) Interventions    Readmission Risk Interventions No flowsheet data found.

## 2019-05-25 NOTE — Progress Notes (Signed)
ANTICOAGULATION CONSULT NOTE  Pharmacy Consult: warfarin Indication: stroke   Patient Measurements: Height: 5\' 8"  (172.7 cm) Weight: 248 lb 14.4 oz (112.9 kg) IBW/kg (Calculated) : 63.9  Vital Signs: Temp: 97.4 F (36.3 C) (09/30 0736) Temp Source: Oral (09/30 0736) BP: 111/85 (09/30 0736) Pulse Rate: 86 (09/30 0736)  Labs: Recent Labs    05/23/19 0254 05/23/19 0355 05/24/19 0417 05/25/19 0440  HGB  --   --  14.9  --   HCT  --   --  44.1  --   PLT  --   --  219  --   LABPROT 18.6*  --  37.7* 42.5*  INR 1.6*  --  3.9* 4.6*  CREATININE  --  0.99 0.94 1.00  TROPONINIHS  --  57*  --   --       Assessment: 35 YOF who presented on 9/24 with acute R-MCA occlusion s/p revascularization. The patient was also noted to have a low EF<20%. Pharmacy consulted to start warfarin for CVA prophylaxis.   INR up and is currently supra-therapeutic at 4.6. No DDI identified and patient is eating up to 50% of her meals.  No bleeding reported.   Goal of Therapy:  INR 2-3 Monitor platelets by anticoagulation protocol: Yes   Plan:  Hold warfarin today Daily PT / INR   Harvel Quale 05/25/2019 8:54 AM

## 2019-05-26 LAB — PROTIME-INR
INR: 3.3 — ABNORMAL HIGH (ref 0.8–1.2)
Prothrombin Time: 33 seconds — ABNORMAL HIGH (ref 11.4–15.2)

## 2019-05-26 LAB — GLUCOSE, CAPILLARY
Glucose-Capillary: 99 mg/dL (ref 70–99)
Glucose-Capillary: 121 mg/dL — ABNORMAL HIGH (ref 70–99)
Glucose-Capillary: 122 mg/dL — ABNORMAL HIGH (ref 70–99)
Glucose-Capillary: 146 mg/dL — ABNORMAL HIGH (ref 70–99)

## 2019-05-26 MED ORDER — WARFARIN SODIUM 1 MG PO TABS
1.0000 mg | ORAL_TABLET | Freq: Once | ORAL | Status: AC
  Administered 2019-05-26: 18:00:00 1 mg via ORAL
  Filled 2019-05-26: qty 1

## 2019-05-26 NOTE — Progress Notes (Signed)
Physical Therapy Treatment Patient Details Name: Breanna Bennett MRN: [id number] DOB: 05-17-1956 Today's Date: 05/26/2019    History of Present Illness 63 y.o. female admitted on 05/19/19 with L sided weakness, facial droop and sensory loss.  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  She did not recieve tPA, but did go to IR for throbectomy.  Pt intubated for IR and remains intubated post op due to failed SBT.  Pt with significant PMH of tricuspid regurgitation, pre-diabeted, mitral regurgitation, renal insufficency, LBBB, HTN, chronic systolic CHF.     PT Comments    Patient seen for mobility progression. Current plan remains appropriate.    Follow Up Recommendations  CIR     Equipment Recommendations  3in1 (PT);Other (comment)(wide/bariatric)    Recommendations for Other Services       Precautions / Restrictions Precautions Precautions: Fall Precaution Comments: L side is weaker Restrictions Weight Bearing Restrictions: No    Mobility  Bed Mobility               General bed mobility comments: pt in bathroom upon arrival and in recliner end of session  Transfers Overall transfer level: Needs assistance Equipment used: Rolling walker (2 wheeled)(face to face with gait belt) Transfers: Sit to/from Stand Sit to Stand: Mod assist         General transfer comment: pt seated in chair without armrests; attempted to stand with pt using RW for support but unable; pt requires mod A to power up into standing with face to face using gait belt with bilat LE blocked  Ambulation/Gait Ambulation/Gait assistance: Min assist;Min guard;Mod assist Gait Distance (Feet): (50-60 ft X 4 with standing rest breaks due to fatigue) Assistive device: 1 person hand held assist Gait Pattern/deviations: Step-through pattern;Decreased stride length;Wide base of support Gait velocity: decreased   General Gait Details: pt requires min guard assist when using RW and min/mod A with +1 HHA for  balance; pt with increase increased instability with horizontal head turns; standing rest breaks required due to fatigue   Stairs             Wheelchair Mobility    Modified Rankin (Stroke Patients Only) Modified Rankin (Stroke Patients Only) Pre-Morbid Rankin Score: No significant disability Modified Rankin: Moderately severe disability     Balance Overall balance assessment: Needs assistance Sitting-balance support: Feet supported Sitting balance-Leahy Scale: Good     Standing balance support: Single extremity supported;During functional activity Standing balance-Leahy Scale: Poor                              Cognition Arousal/Alertness: Awake/alert Behavior During Therapy: WFL for tasks assessed/performed Overall Cognitive Status: No family/caregiver present to determine baseline cognitive functioning Area of Impairment: Safety/judgement;Problem solving                         Safety/Judgement: Decreased awareness of safety   Problem Solving: Requires verbal cues;Difficulty sequencing;Requires tactile cues        Exercises      General Comments        Pertinent Vitals/Pain Pain Assessment: No/denies pain    Home Living                      Prior Function            PT Goals (current goals can now be found in the care plan section) Progress towards PT goals:  Progressing toward goals    Frequency    Min 4X/week      PT Plan Current plan remains appropriate    Co-evaluation              AM-PAC PT "6 Clicks" Mobility   Outcome Measure  Help needed turning from your back to your side while in a flat bed without using bedrails?: A Little Help needed moving from lying on your back to sitting on the side of a flat bed without using bedrails?: A Little Help needed moving to and from a bed to a chair (including a wheelchair)?: A Little Help needed standing up from a chair using your arms (e.g., wheelchair or  bedside chair)?: A Little Help needed to walk in hospital room?: A Little Help needed climbing 3-5 steps with a railing? : A Lot 6 Click Score: 17    End of Session Equipment Utilized During Treatment: Gait belt Activity Tolerance: Patient tolerated treatment well Patient left: with call bell/phone within reach;in chair;with chair alarm set Nurse Communication: Mobility status PT Visit Diagnosis: Muscle weakness (generalized) (M62.81);Difficulty in walking, not elsewhere classified (R26.2);Hemiplegia and hemiparesis Hemiplegia - Right/Left: Left Hemiplegia - dominant/non-dominant: Non-dominant Hemiplegia - caused by: Cerebral infarction     Time: 4492-0100 PT Time Calculation (min) (ACUTE ONLY): 20 min  Charges:  $Gait Training: 8-22 mins                     Breanna Bennett, PTA Acute Rehabilitation Services Pager: [phone] Office: [phone]     Breanna Bennett 05/26/2019, 11:33 AM

## 2019-05-26 NOTE — Progress Notes (Signed)
Patient is considering covid test. Will make a decision in the morning.

## 2019-05-26 NOTE — Plan of Care (Signed)
Patient is progressing towards goals.

## 2019-05-26 NOTE — Progress Notes (Signed)
  Speech Language Pathology Treatment: Cognitive-Linquistic  Patient Details Name: Laquisha Northcraft MRN: [id number] DOB: 10/13/1955 Today's Date: 05/26/2019 Time: 8527-7824 SLP Time Calculation (min) (ACUTE ONLY): 31 min  Assessment / Plan / Recommendation Clinical Impression  Pt was seen for cognitive-linguistic treatment and cooperative throughout the session. She has been progressing well with treatment and reported today that she believes her cognition is improving. She demonstrated 100% accuracy with identification of time on clocks and 80% accuracy with completion of simple time-related math calculations when visuals of clocks were presented increasing to 100% with min cues. She achieved 40% accuracy with time management problems increasing to 100% with mod-max cues. She demonstrated 90% accuracy with word deduction tasks increasing to 100% with min cues. She was able to recall concrete information from voicemail recordings with 80% accuracy increasing to 100% with min-mod cues. She completed a backward digit span task with 80% accuracy increasing to 100% with min cues. SLP will continue to follow pt.    HPI HPI: Pt is a 63 y.o. female who transferred from [facility] ED with acute onset of left hemiplegia, left hemisensory loss and left facial droop. MRI of the brain 3.5 cm region of acute infarction in the right insula and temporal lobe. Few other scattered punctate infarctions in the right MCA territory.      SLP Plan  Continue with current plan of care       Recommendations                   Follow up Recommendations: Inpatient Rehab SLP Visit Diagnosis: Cognitive communication deficit (M35.361) Plan: Continue with current plan of care       Avni Traore I. Hardin Negus, [place], [place] Office number [phone] Pager [place] 05/26/2019, 5:43 PM

## 2019-05-26 NOTE — Progress Notes (Signed)
STROKE TEAM PROGRESS NOTE   INTERVAL HISTORY She  is doing well. No complaints. Patient has been started on warfarin and INR is 3.1 today.  Patient's insurance will not let her go to rehab at: And are awaiting decision from [facility].  Vitals:   05/25/19 2320 05/26/19 0437 05/26/19 0842 05/26/19 1245  BP: (!) 110/93 102/76 102/76 110/67  Pulse: 73 65 81 77  Resp: 17 17 17 17   Temp: 97.6 F (36.4 C) (!) 97.4 F (36.3 C) 97.7 F (36.5 C) 97.6 F (36.4 C)  TempSrc: Oral Oral Oral Oral  SpO2: 99% 100% 98% 100%  Weight:      Height:        CBC:  Recent Labs  Lab 05/20/19 0442 05/21/19 0456 05/24/19 0417  WBC 7.6 9.2 6.3  NEUTROABS 5.5  --   --   HGB 14.6 15.3* 14.9  HCT 43.6 45.9 44.1  MCV 83.4 84.4 84.0  PLT 139* 206 219    Basic Metabolic Panel:  Recent Labs  Lab 05/19/19 1700  05/21/19 0456  05/24/19 0417 05/25/19 0440  NA  --    < > 140   < > 141 138  K  --    < > 3.7   < > 4.2 4.6  CL  --    < > 112*   < > 111 109  CO2  --    < > 16*   < > 20* 21*  GLUCOSE  --    < > 112*   < > 86 91  BUN  --    < > 12   < > 14 13  CREATININE  --    < > 1.04*   < > 0.94 1.00  CALCIUM  --    < > 8.3*   < > 8.4* 8.6*  MG 1.7  --  1.7  --   --   --   PHOS  --   --  4.5  --   --   --    < > = values in this interval not displayed.   Lipid Panel:     Component Value Date/Time   CHOL 96 05/20/2019 0441   TRIG 90 05/20/2019 0441   HDL 15 (L) 05/20/2019 0441   CHOLHDL 6.4 05/20/2019 0441   VLDL 18 05/20/2019 0441   LDLCALC 63 05/20/2019 0441   HgbA1c:  Lab Results  Component Value Date   HGBA1C 7.2 (H) 05/20/2019   Urine Drug Screen:     Component Value Date/Time   LABOPIA NONE DETECTED 05/19/2019 1454   COCAINSCRNUR NONE DETECTED 05/19/2019 1454   LABBENZ NONE DETECTED 05/19/2019 1454   AMPHETMU NONE DETECTED 05/19/2019 1454   THCU NONE DETECTED 05/19/2019 1454   LABBARB NONE DETECTED 05/19/2019 1454    Alcohol Level     Component Value Date/Time   ETH  <10 05/19/2019 1102    IMAGING  Mr Brain Wo Contrast Mr Angio Head Wo Contrast Mr Angio Neck W Wo Contrast 05/20/2019 IMPRESSION:   3.5 cm region of acute infarction in the right insula and temporal lobe. Few other scattered punctate infarctions in the right MCA territory. No mass effect or hemorrhage.   Restoration of flow in the right MCA territory. No new intracranial occlusion.   Atherosclerotic plaque at both carotid bulb regions but without stenosis.   Tiny/thready right vertebral artery which could be congenital or due to acquired right vertebral disease.   The left vertebral is  large vessel that appears widely patent and normal.   Ct Cerebral Perfusion W Contrast 05/19/2019 IMPRESSION:  Nondiagnostic CT perfusion.   Dg Chest Port 1 View 05/19/2019 IMPRESSION:  Interval placement of endotracheal tube suitably position above the carina. Cardiomegaly with chronic lung changes   Dg Abd Portable 1v 05/20/2019 IMPRESSION:  The OG tube has been advanced with the side port and distal tip both in the stomach.   Dg Abd Portable 1v 05/20/2019 IMPRESSION:  The oral gastric tube has been retracted slightly. The tip projects over the region of the gastric body. The side hole projects over the GE junction.  Dg Abd Portable 1v 05/19/2019 IMPRESSION:  Orogastric tube in proximal stomach, side-port EG junction. Consolidation at the left lung base may represent developing pneumonitis.   Dg Hip Port Unilat With Pelvis 1v Right 05/19/2019 IMPRESSION:  No acute osseous injury of the right hip. Given the patient's age and osteopenia, if there is persistent clinical concern for an occult hip fracture, a CT or MRI of the hip is recommended for increased sensitivity.  DG Chest 1 View  05/19/19 IMPRESSION: Interval placement of endotracheal tube suitably position above the carina. Cardiomegaly with chronic lung changes  DG Chest 1 View - pending 05/21/19   Cerebral  Angiogram 9/24/22020 S/P RT CCA arteriogram followed by complete revascularization of RT MCA M 1 occlusion with x 1 pass with 40 mm Solitaire X retriever device achieving a TICI 3 revascularization.Marland Kitchen  PHYSICAL EXAM  Temp:  [97.4 F (36.3 C)-97.7 F (36.5 C)] 97.6 F (36.4 C) (10/01 1245) Pulse Rate:  [65-88] 77 (10/01 1245) Resp:  [17-20] 17 (10/01 1245) BP: (101-110)/(67-93) 110/67 (10/01 1245) SpO2:  [97 %-100 %] 100 % (10/01 1245)  Obese middle-aged Caucasian lady not in distress.  . Afebrile. Head is nontraumatic. Neck is supple without bruit.    Cardiac exam no murmur or gallop. Lungs are clear to auscultation. Distal pulses are well felt. Neurological Exam :  - alert, sitting up in bed, following commands, only oriented to name and says in hospital, pleasant, exophthalmos, left partial hemianopsia, eomi, face symmetric, tongue protrusion midline, antigravity on the right, left side antigravity with drift, 1+ patellars and biceps, toes equiv, w/d to pain x 4. Gait deferred  ASSESSMENT/PLAN [facility] is a 63 y.o. female with history of HTN, HLD, CHF, pre-diabetes, morbid obesity, LBBB and GERD presenting to [facility] who woke with L sided weakness, L hemisensory deficit, speech difficulty and L facial droop. [facility].   Stroke: R MCA infarct due to right M1 occlusion s/p IR with TICI3 reperfusion - embolic secondary to severe cardiomyopathy with low EF - coumadin started  CT head [facility]) no hypodensity  CTA head & neck ([facility]) R MCA occlusion   CT perfusion nondiagnostic  Cerebral angio R M1 occlusion w/ TICI3 revascularization    Post IR CT no ICH or gross mass effect   MRI - 3.5 cm region of acute infarction in the right insula and temporal lobe. Few other scattered punctate infarctions in the right MCA territory.  MRA H&N - Restoration of flow in the right MCA territory. No new intracranial occlusion. Atherosclerotic plaque at both  carotid bulb regions but without stenosis. Tiny/thready right vertebral artery which could be congenital or due to acquired right vertebral disease. The left vertebral is large vessel that appears widely patent and normal.   CT Perfusion -  Non diagnostic  2D Echo EF < 20%  LE venous  doppler - b/l LE edema - Preliminary report There is no evidence of deep vein thrombosis in the lower extremity.   Repeat CXR - 9/26 - Evidence of early edema with cardiomegaly.  LDL 63  HgbA1c 7.2  SCDs for VTE prophylaxis. Added Lovenox 40 mg sq daily    aspirin 81 mg daily prior to admission, now on ASA. Placed Pharm consult for coumadin anticoagulation due to low EF.  Therapy recommendations:  pending   Disposition:  pending   Chronic systolic Congestive heart failure HFrEF w/ NICM  EF < 20%  Home meds:  lasix 40, spironolactone 12.5, coreg 6.25  BNP 3910.8   B/l pedal edema  Gentle hydration, now on IVF @ 40  Cardiology consult -> called, appreciate cardiology  Will need anticoagulation for stroke prevention.  Per Cards, Coumadin preferred.  Hypertension  Home meds:  Coreg 6.25 bid . BP goal 120-140 per IR x 24h post IR . Long-term BP goal normotensive  Hyperlipidemia  Home meds:  lipitor 10  LDL 63, goal < 70  Will resume lipitor once po access - resume Lipitor please  Continue statin at discharge  Diabetes type II Uncontrolled  Home meds:  none  HgbA1c 7.2, goal < 7.0  CBGs  SSI  Dysphagia . Secondary to stroke . NPO . Speech on board . Resume home meds    Other Stroke Risk Factors  Morbid Obesity, Body mass index is 37.85 kg/m., recommend weight loss, diet and exercise as appropriate   Other Active Problems  Exophthalmos, TSH normal  R hip xray neg for fx  Creatinine - 1.04 - post contrast   Hospital day # 7  Plan : Continue Physical and occupational therapy Await  Inpatient rehab transfer to [facility] after insurance approval and bed  availability.  .Pharmacy to [facility] warfarin dosing. No family available at the bedside for discussion     Antony Contras, MD  To contact Stroke Continuity provider, please refer to [email or web address] After hours, contact General Neurology

## 2019-05-26 NOTE — Progress Notes (Signed)
ANTICOAGULATION CONSULT NOTE  Pharmacy Consult: warfarin Indication: stroke   Patient Measurements: Height: 5\' 8"  (172.7 cm) Weight: 248 lb 14.4 oz (112.9 kg) IBW/kg (Calculated) : 63.9  Vital Signs: Temp: 97.4 F (36.3 C) (10/01 0437) Temp Source: Oral (10/01 0437) BP: 102/76 (10/01 0437) Pulse Rate: 65 (10/01 0437)  Labs: Recent Labs    05/24/19 0417 05/25/19 0440 05/26/19 0411  HGB 14.9  --   --   HCT 44.1  --   --   PLT 219  --   --   LABPROT 37.7* 42.5* 33.0*  INR 3.9* 4.6* 3.3*  CREATININE 0.94 1.00  --       Assessment: 25 YOF who presented on 9/24 with acute R-MCA occlusion s/p revascularization. The patient was also noted to have a low EF<20%. Pharmacy consulted to start warfarin for CVA prophylaxis.   INR remains supra-therapeutic at 3.3. No DDI identified and patient is eating up to 50% of her meals.  No bleeding reported. Warfarin is new for this patient, she had an unusually rapid increase in her INR after only two doses of warfarin.   Goal of Therapy:  INR 2-3 Monitor platelets by anticoagulation protocol: Yes   Plan:  -Warfarin 1 mg po x1 - Daily PT / INR   Harvel Quale 05/26/2019 7:49 AM

## 2019-05-27 DIAGNOSIS — IMO0002 Reserved for concepts with insufficient information to code with codable children: Secondary | ICD-10-CM

## 2019-05-27 DIAGNOSIS — E1165 Type 2 diabetes mellitus with hyperglycemia: Secondary | ICD-10-CM

## 2019-05-27 HISTORY — DX: Reserved for concepts with insufficient information to code with codable children: IMO0002

## 2019-05-27 HISTORY — DX: Type 2 diabetes mellitus with hyperglycemia: E11.65

## 2019-05-27 LAB — GLUCOSE, CAPILLARY
Glucose-Capillary: 102 mg/dL — ABNORMAL HIGH (ref 70–99)
Glucose-Capillary: 120 mg/dL — ABNORMAL HIGH (ref 70–99)
Glucose-Capillary: 120 mg/dL — ABNORMAL HIGH (ref 70–99)
Glucose-Capillary: 121 mg/dL — ABNORMAL HIGH (ref 70–99)
Glucose-Capillary: 147 mg/dL — ABNORMAL HIGH (ref 70–99)
Glucose-Capillary: 151 mg/dL — ABNORMAL HIGH (ref 70–99)

## 2019-05-27 LAB — CBC
HCT: 49.5 % — ABNORMAL HIGH (ref 36.0–46.0)
Hemoglobin: 16.2 g/dL — ABNORMAL HIGH (ref 12.0–15.0)
MCH: 27.8 pg (ref 26.0–34.0)
MCHC: 32.7 g/dL (ref 30.0–36.0)
MCV: 84.9 fL (ref 80.0–100.0)
Platelets: 239 10*3/uL (ref 150–400)
RBC: 5.83 MIL/uL — ABNORMAL HIGH (ref 3.87–5.11)
RDW: 17.5 % — ABNORMAL HIGH (ref 11.5–15.5)
WBC: 5.7 10*3/uL (ref 4.0–10.5)
nRBC: 0 % (ref 0.0–0.2)

## 2019-05-27 LAB — NOVEL CORONAVIRUS, NAA (HOSP ORDER, SEND-OUT TO REF LAB; TAT 18-24 HRS): SARS-CoV-2, NAA: NOT DETECTED

## 2019-05-27 LAB — PROTIME-INR
INR: 2.3 — ABNORMAL HIGH (ref 0.8–1.2)
Prothrombin Time: 25.3 seconds — ABNORMAL HIGH (ref 11.4–15.2)

## 2019-05-27 LAB — BASIC METABOLIC PANEL
Anion gap: 11 (ref 5–15)
BUN: 12 mg/dL (ref 8–23)
CO2: 23 mmol/L (ref 22–32)
Calcium: 9.4 mg/dL (ref 8.9–10.3)
Chloride: 107 mmol/L (ref 98–111)
Creatinine, Ser: 1.14 mg/dL — ABNORMAL HIGH (ref 0.44–1.00)
GFR calc Af Amer: 59 mL/min — ABNORMAL LOW (ref >60)
GFR calc non Af Amer: 51 mL/min — ABNORMAL LOW (ref >60)
Glucose, Bld: 120 mg/dL — ABNORMAL HIGH (ref 70–99)
Potassium: 4.2 mmol/L (ref 3.5–5.1)
Sodium: 141 mmol/L (ref 135–145)

## 2019-05-27 MED ORDER — WARFARIN SODIUM 1 MG PO TABS
1.5000 mg | ORAL_TABLET | Freq: Once | ORAL | Status: AC
  Administered 2019-05-27: 1.5 mg via ORAL
  Filled 2019-05-27: qty 1

## 2019-05-27 MED ORDER — ATORVASTATIN CALCIUM 10 MG PO TABS
10.0000 mg | ORAL_TABLET | Freq: Every day | ORAL | Status: DC
  Administered 2019-05-27 – 2019-05-30 (×4): 10 mg via ORAL
  Filled 2019-05-27 (×4): qty 1

## 2019-05-27 NOTE — Progress Notes (Signed)
Physical Therapy Treatment Patient Details Name: [facility] MRN: [id number] DOB: 1956-07-09 Today's Date: 05/27/2019    History of Present Illness 63 y.o. female admitted on 05/19/19 with L sided weakness, facial droop and sensory loss.  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  She did not recieve tPA, but did go to IR for throbectomy.  Pt intubated for IR and remains intubated post op due to failed SBT.  Pt with significant PMH of tricuspid regurgitation, pre-diabeted, mitral regurgitation, renal insufficency, LBBB, HTN, chronic systolic CHF.     PT Comments    Patient seen for mobility progression. Current plan remains appropriate.    Follow Up Recommendations  CIR;Other (comment)(plan is for IP rehab in [facility])     Equipment Recommendations  Other (comment)(TBD next venue)    Recommendations for Other Services Rehab consult     Precautions / Restrictions Precautions Precautions: Fall Precaution Comments: L side is weaker Restrictions Weight Bearing Restrictions: No    Mobility  Bed Mobility               General bed mobility comments: pt OOB in chair upon arrival  Transfers Overall transfer level: Needs assistance Equipment used: Rolling walker (2 wheeled) Transfers: Sit to/from Stand Sit to Stand: Mod assist         General transfer comment: assist to power up into standing; cues for safe technique  Ambulation/Gait Ambulation/Gait assistance: Mod assist Gait Distance (Feet): 100 Feet Assistive device: 1 person hand held assist Gait Pattern/deviations: Step-through pattern;Decreased stride length;Wide base of support Gait velocity: decreased   General Gait Details: pt requires assistance for balance; LOB when attempting horizontal head turns; multiple standing rest breaks due to fatigue    Stairs             Wheelchair Mobility    Modified Rankin (Stroke Patients Only) Modified Rankin (Stroke Patients Only) Pre-Morbid Rankin Score:  No significant disability Modified Rankin: Moderately severe disability     Balance Overall balance assessment: Needs assistance Sitting-balance support: Feet supported Sitting balance-Leahy Scale: Good     Standing balance support: Single extremity supported;During functional activity Standing balance-Leahy Scale: Poor                              Cognition Arousal/Alertness: Awake/alert Behavior During Therapy: WFL for tasks assessed/performed Overall Cognitive Status: No family/caregiver present to determine baseline cognitive functioning Area of Impairment: Safety/judgement;Problem solving                         Safety/Judgement: Decreased awareness of safety   Problem Solving: Requires verbal cues;Difficulty sequencing        Exercises      General Comments        Pertinent Vitals/Pain Pain Assessment: No/denies pain Faces Pain Scale: Hurts little more    Home Living                      Prior Function            PT Goals (current goals can now be found in the care plan section) Progress towards PT goals: Progressing toward goals    Frequency    Min 4X/week      PT Plan Current plan remains appropriate    Co-evaluation              AM-PAC PT "6 Clicks" Mobility   Outcome  Measure  Help needed turning from your back to your side while in a flat bed without using bedrails?: A Little Help needed moving from lying on your back to sitting on the side of a flat bed without using bedrails?: A Little Help needed moving to and from a bed to a chair (including a wheelchair)?: A Little Help needed standing up from a chair using your arms (e.g., wheelchair or bedside chair)?: A Little Help needed to walk in hospital room?: A Little Help needed climbing 3-5 steps with a railing? : A Lot 6 Click Score: 17    End of Session Equipment Utilized During Treatment: Gait belt Activity Tolerance: Patient tolerated treatment  well Patient left: with call bell/phone within reach;in chair;with chair alarm set Nurse Communication: Mobility status PT Visit Diagnosis: Muscle weakness (generalized) (M62.81);Difficulty in walking, not elsewhere classified (R26.2);Hemiplegia and hemiparesis Hemiplegia - Right/Left: Left Hemiplegia - dominant/non-dominant: Non-dominant Hemiplegia - caused by: Cerebral infarction     Time: 1510-1530 PT Time Calculation (min) (ACUTE ONLY): 20 min  Charges:  $Gait Training: 8-22 mins                     Earney Navy, PTA Acute Rehabilitation Services Pager: [phone] Office: [phone]     Darliss Cheney 05/27/2019, 4:41 PM

## 2019-05-27 NOTE — TOC Progression Note (Signed)
Transition of Care [facility]) - Progression Note    Patient Details  Name: [facility] MRN: [id number] Date of Birth: Apr 23, 1956  Transition of Care [facility]) CM/SW Contact  Pollie Friar, RN Phone Number: 05/27/2019, 3:26 PM  Clinical Narrative:    Covid resulted late in the day. [facility] is unable to accept the patient today. She does have insurance [place]. Plan is for her to d/c to [facility] on Monday. MD is aware.    Expected Discharge Plan: IP Rehab Facility Barriers to Discharge: Continued Medical Work up, Pharmacist, community is out of network with her insurance.)  Expected Discharge Plan and Services Expected Discharge Plan: [facility] manager Services: CM Consult                                           Social Determinants of Health (SDOH) Interventions    Readmission Risk Interventions No flowsheet data found.

## 2019-05-27 NOTE — Progress Notes (Signed)
STROKE TEAM PROGRESS NOTE   INTERVAL HISTORY She  is doing well. No complaints. Patient has been started on warfarin and INR is 2.3 today.  Yet we are awaiting decision from [facility] rehab.  Vitals:   05/27/19 0011 05/27/19 0338 05/27/19 1014 05/27/19 1129  BP: 98/71 (!) 119/94 95/82 98/68   Pulse: 68 81 71 74  Resp: 18 17 16 17   Temp: (!) 97.5 F (36.4 C)   97.9 F (36.6 C)  TempSrc: Oral   Oral  SpO2: (!) 87% 100% 100% 100%  Weight:      Height:        CBC:  Recent Labs  Lab 05/24/19 0417 05/27/19 0434  WBC 6.3 5.7  HGB 14.9 16.2*  HCT 44.1 49.5*  MCV 84.0 84.9  PLT 219 921    Basic Metabolic Panel:  Recent Labs  Lab 05/21/19 0456  05/25/19 0440 05/27/19 0434  NA 140   < > 138 141  K 3.7   < > 4.6 4.2  CL 112*   < > 109 107  CO2 16*   < > 21* 23  GLUCOSE 112*   < > 91 120*  BUN 12   < > 13 12  CREATININE 1.04*   < > 1.00 1.14*  CALCIUM 8.3*   < > 8.6* 9.4  MG 1.7  --   --   --   PHOS 4.5  --   --   --    < > = values in this interval not displayed.   Lipid Panel:     Component Value Date/Time   CHOL 96 05/20/2019 0441   TRIG 90 05/20/2019 0441   HDL 15 (L) 05/20/2019 0441   CHOLHDL 6.4 05/20/2019 0441   VLDL 18 05/20/2019 0441   LDLCALC 63 05/20/2019 0441   HgbA1c:  Lab Results  Component Value Date   HGBA1C 7.2 (H) 05/20/2019   Urine Drug Screen:     Component Value Date/Time   LABOPIA NONE DETECTED 05/19/2019 1454   COCAINSCRNUR NONE DETECTED 05/19/2019 1454   LABBENZ NONE DETECTED 05/19/2019 1454   AMPHETMU NONE DETECTED 05/19/2019 1454   THCU NONE DETECTED 05/19/2019 1454   LABBARB NONE DETECTED 05/19/2019 1454    Alcohol Level     Component Value Date/Time   ETH <10 05/19/2019 1102    IMAGING  Mr Brain Wo Contrast Mr Angio Head Wo Contrast Mr Angio Neck W Wo Contrast 05/20/2019 IMPRESSION:   3.5 cm region of acute infarction in the right insula and temporal lobe. Few other scattered punctate infarctions in the right MCA  territory. No mass effect or hemorrhage.   Restoration of flow in the right MCA territory. No new intracranial occlusion.   Atherosclerotic plaque at both carotid bulb regions but without stenosis.   Tiny/thready right vertebral artery which could be congenital or due to acquired right vertebral disease.   The left vertebral is large vessel that appears widely patent and normal.   Ct Cerebral Perfusion W Contrast 05/19/2019 IMPRESSION:  Nondiagnostic CT perfusion.   Dg Chest Port 1 View 05/19/2019 IMPRESSION:  Interval placement of endotracheal tube suitably position above the carina. Cardiomegaly with chronic lung changes   Dg Abd Portable 1v 05/20/2019 IMPRESSION:  The OG tube has been advanced with the side port and distal tip both in the stomach.   Dg Abd Portable 1v 05/20/2019 IMPRESSION:  The oral gastric tube has been retracted slightly. The tip projects over the region of the gastric body.  The side hole projects over the GE junction.  Dg Abd Portable 1v 05/19/2019 IMPRESSION:  Orogastric tube in proximal stomach, side-port EG junction. Consolidation at the left lung base may represent developing pneumonitis.   Dg Hip Port Unilat With Pelvis 1v Right 05/19/2019 IMPRESSION:  No acute osseous injury of the right hip. Given the patient's age and osteopenia, if there is persistent clinical concern for an occult hip fracture, a CT or MRI of the hip is recommended for increased sensitivity.  DG Chest 1 View  05/19/19 IMPRESSION: Interval placement of endotracheal tube suitably position above the carina. Cardiomegaly with chronic lung changes  DG Chest 1 View - trace pleural effusions. 05/21/19   Cerebral Angiogram 9/24/22020 S/P RT CCA arteriogram followed by complete revascularization of RT MCA M 1 occlusion with x 1 pass with 40 mm Solitaire X retriever device achieving a TICI 3 revascularization.Marland Kitchen  PHYSICAL EXAM  Temp:  [97.5 F (36.4 C)-97.9 F (36.6 C)]  97.9 F (36.6 C) (10/02 1129) Pulse Rate:  [68-89] 74 (10/02 1129) Resp:  [16-18] 17 (10/02 1129) BP: (92-119)/(59-94) 98/68 (10/02 1129) SpO2:  [87 %-100 %] 100 % (10/02 1129)  Obese middle-aged Caucasian lady not in distress.  . Afebrile. Head is nontraumatic. Neck is supple without bruit.    Cardiac exam no murmur or gallop. Lungs are clear to auscultation. Distal pulses are well felt. Neurological Exam :  - alert, sitting up in bed, following commands, only oriented to name and says in hospital, pleasant, exophthalmos, left partial hemianopsia, eomi, face symmetric, tongue protrusion midline, antigravity on the right, left side antigravity with drift, 1+ patellars and biceps, toes equiv, w/d to pain x 4. Gait deferred  ASSESSMENT/PLAN [name] is a 63 y.o. female with history of HTN, HLD, CHF, pre-diabetes, morbid obesity, LBBB and GERD presenting to [facility] who woke with L sided weakness, L hemisensory deficit, speech difficulty and L facial droop. To [facility] for IR.   Stroke: R MCA infarct due to right M1 occlusion s/p IR with TICI3 reperfusion - embolic secondary to severe cardiomyopathy with low EF - coumadin started  CT head [facility]) no hypodensity  CTA head & neck ([place]) R MCA occlusion   CT perfusion nondiagnostic  Cerebral angio R M1 occlusion w/ TICI3 revascularization    Post IR CT no ICH or gross mass effect   MRI - 3.5 cm region of acute infarction in the right insula and temporal lobe. Few other scattered punctate infarctions in the right MCA territory.  MRA H&N - Restoration of flow in the right MCA territory. No new intracranial occlusion. Atherosclerotic plaque at both carotid bulb regions but without stenosis. Tiny/thready right vertebral artery which could be congenital or due to acquired right vertebral disease. The left vertebral is large vessel that appears widely patent and normal.   CT Perfusion -  Non diagnostic  2D Echo EF <  20%  LE venous doppler - b/l LE edema - Preliminary report There is no evidence of deep vein thrombosis in the lower extremity.   Repeat CXR - 9/26 - Evidence of early edema with cardiomegaly.  LDL 63  HgbA1c 7.2  SCDs for VTE prophylaxis. Added Lovenox 40 mg sq daily    aspirin 81 mg daily prior to admission, now on ASA. Placed Pharm consult for coumadin anticoagulation due to low EF.  Therapy recommendations:  pending   Disposition:  pending   Chronic systolic Congestive heart failure HFrEF w/ NICM  EF < 20%  Home meds:  lasix 40, spironolactone 12.5, coreg 6.25  BNP 3910.8   B/l pedal edema  Gentle hydration, now on IVF @ 40  Cardiology consult -> called, appreciate cardiology  Will need anticoagulation for stroke prevention.  Per Cards, Coumadin preferred.  Hypertension  Home meds:  Coreg 6.25 bid . BP goal 120-140 per IR x 24h post IR . Long-term BP goal normotensive  Hyperlipidemia  Home meds:  lipitor 10  LDL 63, goal < 70  Will resume lipitor once po access - resume Lipitor please  Continue statin at discharge  Diabetes type II Uncontrolled  Home meds:  none  HgbA1c 7.2, goal < 7.0  CBGs  SSI  Dysphagia . Secondary to stroke . NPO . Speech on board . Resume home meds    Other Stroke Risk Factors  Morbid Obesity, Body mass index is 37.85 kg/m., recommend weight loss, diet and exercise as appropriate   Other Active Problems  Exophthalmos, TSH normal  R hip xray neg for fx  Creatinine - 1.04 - post contrast   Hospital day # 8  Plan : Continue Physical and occupational therapy Await  Inpatient rehab transfer to [facility] after insurance approval and bed availability.   Medically stable for transfer No family available at the bedside for discussion     Delia Heady, MD  To contact Stroke Continuity provider, please refer to [email or web address]. After hours, contact General Neurology

## 2019-05-27 NOTE — Plan of Care (Signed)
Progressing towards goals

## 2019-05-27 NOTE — Progress Notes (Signed)
ANTICOAGULATION CONSULT NOTE  Pharmacy Consult: warfarin Indication: stroke   Patient Measurements: Height: 5\' 8"  (172.7 cm) Weight: 248 lb 14.4 oz (112.9 kg) IBW/kg (Calculated) : 63.9  Vital Signs: Temp: 97.5 F (36.4 C) (10/02 0011) Temp Source: Oral (10/02 0011) BP: 95/82 (10/02 1014) Pulse Rate: 71 (10/02 1014)  Labs: Recent Labs    05/25/19 0440 05/26/19 0411 05/27/19 0434  HGB  --   --  16.2*  HCT  --   --  49.5*  PLT  --   --  239  LABPROT 42.5* 33.0* 25.3*  INR 4.6* 3.3* 2.3*  CREATININE 1.00  --  1.14*      Assessment: 34 YOF who presented on 9/24 with acute R-MCA occlusion s/p revascularization. The patient was also noted to have a low EF<20%. Pharmacy consulted to start warfarin for CVA prophylaxis.   INR today has trended into the therapeutic range (INR 2.3 << 3.3, goal of 2-3). The patient had a rapid rise in INR after only two doses of warfarin. No DDI identified and patient is eating 90-100% of her meals. Warfarin resumed 10/1 - will continue with a low dose while watching INR trends. CBC stable - no bleeding noted.   Goal of Therapy:  INR 2-3 Monitor platelets by anticoagulation protocol: Yes   Plan:  - Warfarin 1.5 mg x 1 dose at 1800 today - Daily PT/INR, CBC q72h - Will continue to monitor for any signs/symptoms of bleeding and will follow up with PT/INR in the a.m.    Thank you for allowing pharmacy to be a part of this patient's care.  Alycia Rossetti, PharmD, BCPS Clinical Pharmacist Clinical phone for 05/27/2019: [phone] 05/27/2019 11:11 AM   **Pharmacist phone directory can now be found on [email or web address] ([email or web address]).  Listed under [place].

## 2019-05-28 ENCOUNTER — Inpatient Hospital Stay (HOSPITAL_COMMUNITY): Payer: BLUE CROSS/BLUE SHIELD

## 2019-05-28 LAB — CBC
HCT: 47.2 % — ABNORMAL HIGH (ref 36.0–46.0)
Hemoglobin: 15.8 g/dL — ABNORMAL HIGH (ref 12.0–15.0)
MCH: 28.3 pg (ref 26.0–34.0)
MCHC: 33.5 g/dL (ref 30.0–36.0)
MCV: 84.4 fL (ref 80.0–100.0)
Platelets: 225 10*3/uL (ref 150–400)
RBC: 5.59 MIL/uL — ABNORMAL HIGH (ref 3.87–5.11)
RDW: 17.5 % — ABNORMAL HIGH (ref 11.5–15.5)
WBC: 5.7 10*3/uL (ref 4.0–10.5)
nRBC: 0 % (ref 0.0–0.2)

## 2019-05-28 LAB — PROTIME-INR
INR: 2.1 — ABNORMAL HIGH (ref 0.8–1.2)
Prothrombin Time: 23.2 seconds — ABNORMAL HIGH (ref 11.4–15.2)

## 2019-05-28 LAB — BASIC METABOLIC PANEL
Anion gap: 10 (ref 5–15)
BUN: 14 mg/dL (ref 8–23)
CO2: 22 mmol/L (ref 22–32)
Calcium: 8.8 mg/dL — ABNORMAL LOW (ref 8.9–10.3)
Chloride: 106 mmol/L (ref 98–111)
Creatinine, Ser: 1.15 mg/dL — ABNORMAL HIGH (ref 0.44–1.00)
GFR calc Af Amer: 59 mL/min — ABNORMAL LOW (ref >60)
GFR calc non Af Amer: 51 mL/min — ABNORMAL LOW (ref >60)
Glucose, Bld: 91 mg/dL (ref 70–99)
Potassium: 4.5 mmol/L (ref 3.5–5.1)
Sodium: 138 mmol/L (ref 135–145)

## 2019-05-28 LAB — GLUCOSE, CAPILLARY
Glucose-Capillary: 109 mg/dL — ABNORMAL HIGH (ref 70–99)
Glucose-Capillary: 143 mg/dL — ABNORMAL HIGH (ref 70–99)
Glucose-Capillary: 79 mg/dL (ref 70–99)
Glucose-Capillary: 95 mg/dL (ref 70–99)

## 2019-05-28 IMAGING — DX DG CHEST 1V PORT
1 series · 1 of 1 positions shown · non-contrast
Comparison: [DATE]

CLINICAL DATA: Congestive heart

EXAM:
PORTABLE CHEST 1 VIEW

[chest ap]
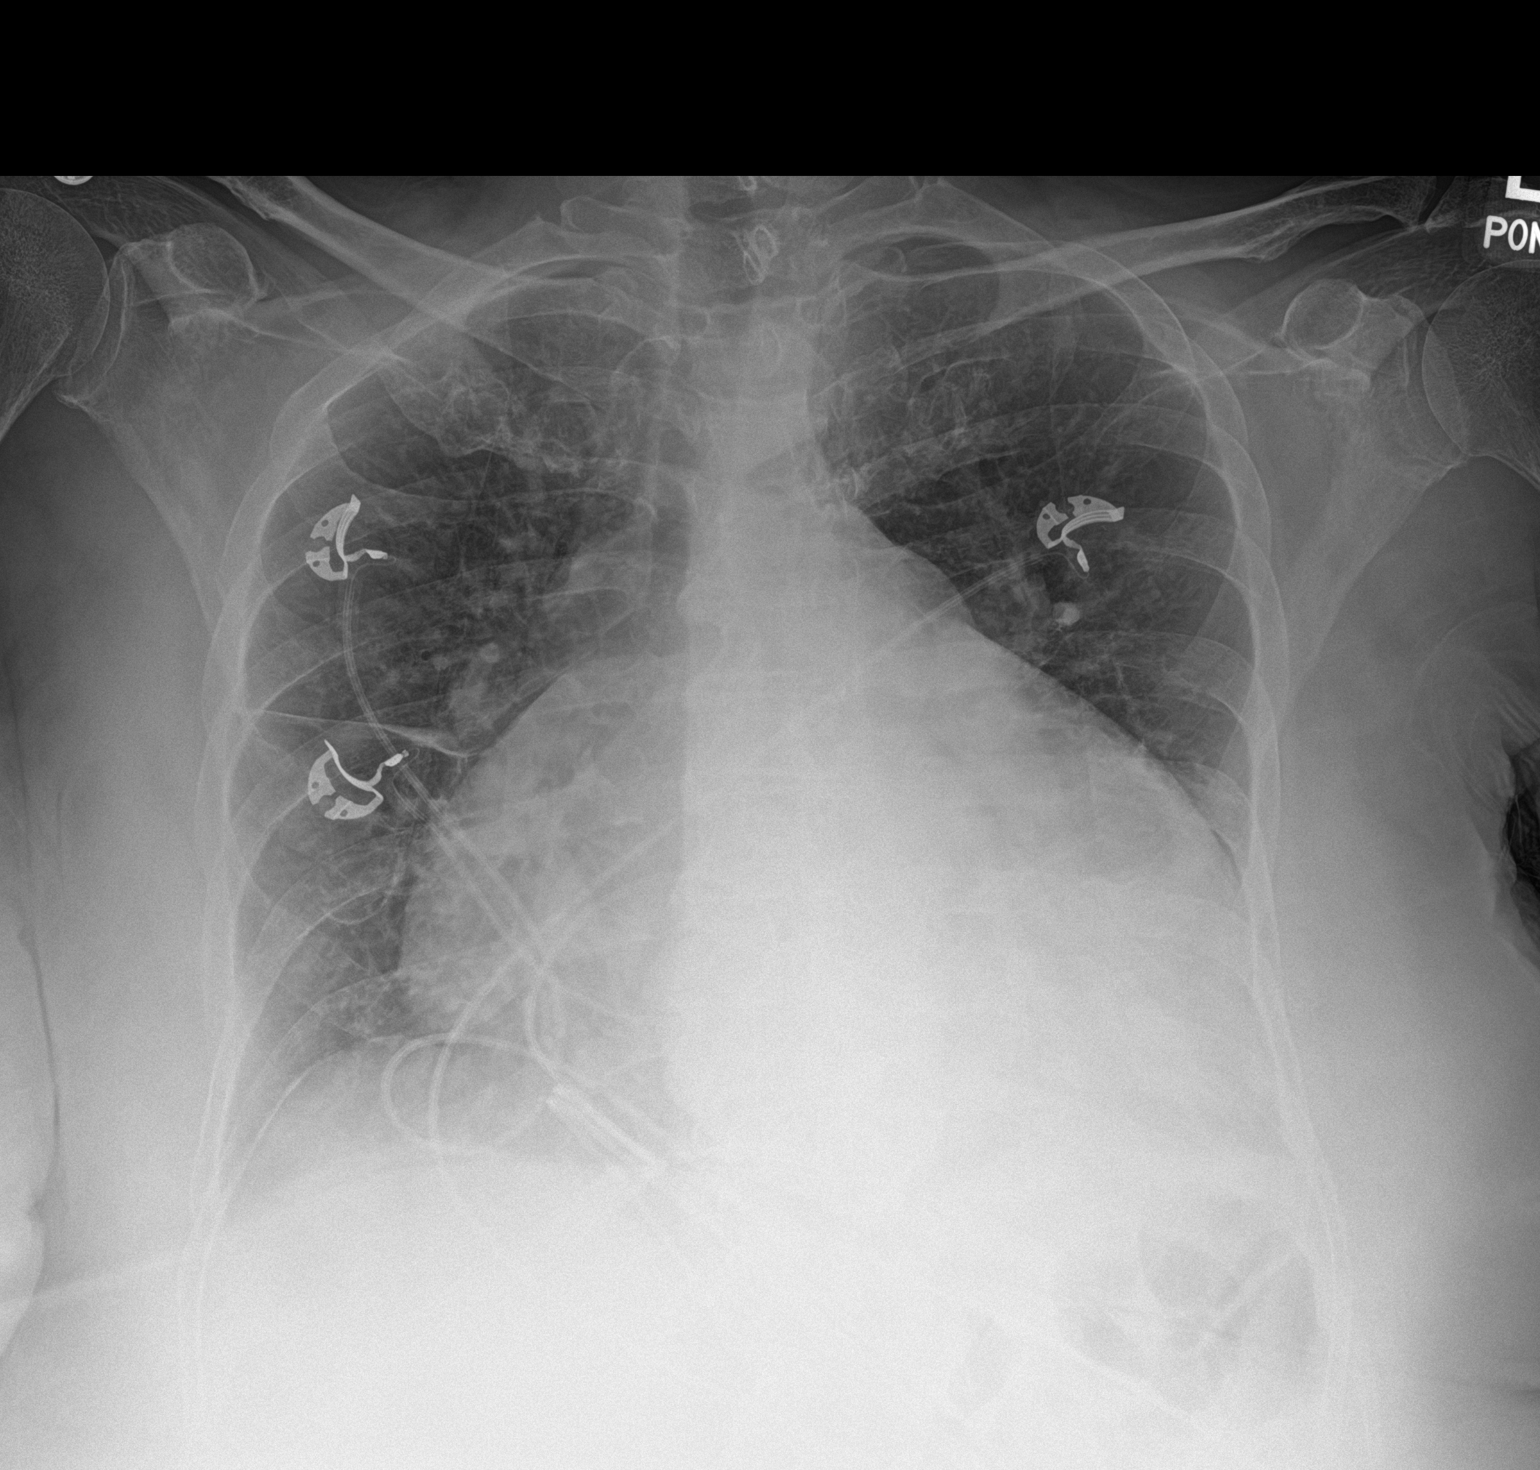

[1 of 1 positions shown; findings below may reference images not displayed]

FINDINGS: Lungs are adequately inflated without focal lobar consolidation or
pneumothorax. Possible small amount left pleural fluid impossible
left basilar/retrocardiac opacification unchanged likely
atelectasis. Moderate stable cardiomegaly. Remainder of the exam is
unchanged.
IMPRESSION: Subtle left base/retrocardiac density likely atelectasis and
unchanged. Possible small amount left pleural fluid unchanged.

Stable moderate cardiomegaly

## 2019-05-28 MED ORDER — WARFARIN SODIUM 1 MG PO TABS
1.0000 mg | ORAL_TABLET | Freq: Once | ORAL | Status: AC
  Administered 2019-05-28: 1 mg via ORAL
  Filled 2019-05-28: qty 1

## 2019-05-28 MED ORDER — PANTOPRAZOLE SODIUM 40 MG PO TBEC
40.0000 mg | DELAYED_RELEASE_TABLET | Freq: Every day | ORAL | Status: DC
  Administered 2019-05-28 – 2019-05-30 (×3): 40 mg via ORAL
  Filled 2019-05-28 (×3): qty 1

## 2019-05-28 NOTE — Progress Notes (Signed)
ANTICOAGULATION CONSULT NOTE  Pharmacy Consult: warfarin Indication: stroke   Patient Measurements: Height: 5\' 8"  (172.7 cm) Weight: 248 lb 14.4 oz (112.9 kg) IBW/kg (Calculated) : 63.9  Vital Signs: Temp: 98.4 F (36.9 C) (10/03 0000) Temp Source: Oral (10/03 0000) BP: 109/89 (10/03 0808) Pulse Rate: 60 (10/03 0808)  Labs: Recent Labs    05/26/19 0411 05/27/19 0434 05/28/19 0421  HGB  --  16.2* 15.8*  HCT  --  49.5* 47.2*  PLT  --  239 225  LABPROT 33.0* 25.3* 23.2*  INR 3.3* 2.3* 2.1*  CREATININE  --  1.14* 1.15*    Assessment: 7 YOF who presented on 9/24 with acute R-MCA occlusion s/p revascularization. The patient was also noted to have a low EF<20%. Pharmacy consulted to start warfarin for CVA prophylaxis.   INR 2.1 CBC stable  Goal of Therapy:  INR 2-3 Monitor platelets by anticoagulation protocol: Yes   Plan:  Warfarin 1 mg x 1 Daily INR  Levester Fresh, PharmD, BCPS, BCCCP Clinical Pharmacist [phone]  Please check AMION for all [place] numbers  05/28/2019 9:30 AM

## 2019-05-28 NOTE — Progress Notes (Signed)
STROKE TEAM PROGRESS NOTE   INTERVAL HISTORY She is doing well. No complaints. RN at bedside, pt is walking in room with walker. Pending Monday admission to [facility] rehab. INR today 2.1  Vitals:   05/27/19 1129 05/27/19 1759 05/27/19 2000 05/28/19 0000  BP: 98/68 102/81 109/80 110/82  Pulse: 74 75 78 68  Resp: 17 17 18 18   Temp: 97.9 F (36.6 C) 97.7 F (36.5 C) 98.3 F (36.8 C) 98.4 F (36.9 C)  TempSrc: Oral Oral Oral Oral  SpO2: 100% 99% 100% 99%  Weight:      Height:        CBC:  Recent Labs  Lab 05/27/19 0434 05/28/19 0421  WBC 5.7 5.7  HGB 16.2* 15.8*  HCT 49.5* 47.2*  MCV 84.9 84.4  PLT 239 225    Basic Metabolic Panel:  Recent Labs  Lab 05/27/19 0434 05/28/19 0421  NA 141 138  K 4.2 4.5  CL 107 106  CO2 23 22  GLUCOSE 120* 91  BUN 12 14  CREATININE 1.14* 1.15*  CALCIUM 9.4 8.8*   Lipid Panel:     Component Value Date/Time   CHOL 96 05/20/2019 0441   TRIG 90 05/20/2019 0441   HDL 15 (L) 05/20/2019 0441   CHOLHDL 6.4 05/20/2019 0441   VLDL 18 05/20/2019 0441   LDLCALC 63 05/20/2019 0441   HgbA1c:  Lab Results  Component Value Date   HGBA1C 7.2 (H) 05/20/2019   Urine Drug Screen:     Component Value Date/Time   LABOPIA NONE DETECTED 05/19/2019 1454   COCAINSCRNUR NONE DETECTED 05/19/2019 1454   LABBENZ NONE DETECTED 05/19/2019 1454   AMPHETMU NONE DETECTED 05/19/2019 1454   THCU NONE DETECTED 05/19/2019 1454   LABBARB NONE DETECTED 05/19/2019 1454    Alcohol Level     Component Value Date/Time   ETH <10 05/19/2019 1102    IMAGING  Mr Brain Wo Contrast Mr Angio Head Wo Contrast Mr Angio Neck W Wo Contrast 05/20/2019 IMPRESSION:   3.5 cm region of acute infarction in the right insula and temporal lobe. Few other scattered punctate infarctions in the right MCA territory. No mass effect or hemorrhage.   Restoration of flow in the right MCA territory. No new intracranial occlusion.   Atherosclerotic plaque at both carotid  bulb regions but without stenosis.   Tiny/thready right vertebral artery which could be congenital or due to acquired right vertebral disease.   The left vertebral is large vessel that appears widely patent and normal.   Ct Cerebral Perfusion W Contrast 05/19/2019 IMPRESSION:  Nondiagnostic CT perfusion.   Cerebral Angiogram 9/24/22020 S/P RT CCA arteriogram followed by complete revascularization of RT MCA M 1 occlusion with x 1 pass with 05/20/2201 40 mm Solitaire X retriever device achieving a TICI 3 revascularization.   PHYSICAL EXAM  Temp:  [97.7 F (36.5 C)-98.4 F (36.9 C)] 98.4 F (36.9 C) (10/03 0000) Pulse Rate:  [68-78] 68 (10/03 0000) Resp:  [16-18] 18 (10/03 0000) BP: (95-110)/(68-82) 110/82 (10/03 0000) SpO2:  [99 %-100 %] 99 % (10/03 0000)  Obese middle-aged Caucasian lady not in distress. Afebrile. Head is nontraumatic. Neck is supple without bruit.    Cardiac exam no murmur or gallop. Lungs are clear to auscultation. Distal pulses are well felt. Neurological Exam : Awake alert, walking in room with walker, following commands, oriented to name and place, wrong on month, no aphasia, no dysarthria. Exophthalmos, left partial hemianopsia, EOMI, face symmetric, tongue protrusion midline, moving all  extremities equally, 1+ patellars and biceps, toes equiv. Sensation symmetrical. FTN intact. normal station on standing, able to walk with walker, no fall tendency, slow stride but stable, no hemiparetic gait.  ASSESSMENT/PLAN Ms. Kemaria Dedic is a 63 y.o. female with history of HTN, HLD, CHF, pre-diabetes, morbid obesity, LBBB and GERD presenting to [facility] who woke with L sided weakness, L hemisensory deficit, speech difficulty and L facial droop. To [facility] for IR.   Stroke: R MCA infarct due to right M1 occlusion s/p IR with TICI3 reperfusion - embolic secondary to severe cardiomyopathy with low EF  CT head [place]) no hypodensity  CTA head & neck ([place]) R  MCA occlusion   CT perfusion nondiagnostic  Cerebral angio R M1 occlusion w/ TICI3 revascularization    Post IR CT no ICH or gross mass effect   MRI - 3.5 cm region of acute infarction in the right insula and temporal lobe. Few other scattered punctate infarctions in the right MCA territory.  MRA H&N - Restoration of flow in the right MCA territory. No new intracranial occlusion. Atherosclerotic plaque at both carotid bulb regions but without stenosis. CT Perfusion -  Non diagnostic  2D Echo EF < 20%  LE venous doppler - no DVT  LDL 63  HgbA1c 7.2  SCDs for VTE prophylaxis. Added Lovenox 40 mg sq daily    aspirin 81 mg daily prior to admission, now on coumadin due to low EF. INR goal 2-3  Therapy recommendations:  CIR  Disposition:  pending [facility] on Monday - repeat COVID pending  Chronic systolic Congestive heart failure HFrEF w/ NICM  EF < 20%  Home meds:  lasix 40, spironolactone 12.5, coreg 6.25  BNP 3910.8   B/l pedal edema  Cardiology consulted for low EF  (appreciate cardiology)  Anticoagulation for stroke prevention. Per Cards, coumadin preferred.  On coumadin INR goal 2-3  INR 2.3->2.1  Hypertension  Home meds:  Coreg 6.25 bid . BP mildly low c/w low EF  On lasix 40, spironolactone 12.5, coreg 6.25 . Long-term BP goal normotensive  Hyperlipidemia  Home meds:  lipitor 10  LDL 63, goal < 70  resumed Lipitor 10 mg daily  Continue statin at discharge  Diabetes type II Uncontrolled  Home meds:  none  HgbA1c 7.2, goal < 7.0  CBGs  SSI  PCP close follow up  Dysphagia, resolved . Secondary to stroke . On diet now . Speech on board . On home meds    Other Stroke Risk Factors  Morbid Obesity, Body mass index is 37.85 kg/m., recommend weight loss, diet and exercise as appropriate   Other Active Problems  Exophthalmos, TSH normal  R hip xray neg for fx  Creatinine - 1.15  Hospital day # 9   Rosalin Hawking, MD PhD Stroke  Neurology 05/28/2019 5:10 PM        To contact Stroke Continuity provider, please refer to [email or web address] After hours, contact General Neurology

## 2019-05-29 LAB — GLUCOSE, CAPILLARY
Glucose-Capillary: 95 mg/dL (ref 70–99)
Glucose-Capillary: 108 mg/dL — ABNORMAL HIGH (ref 70–99)
Glucose-Capillary: 100 mg/dL — ABNORMAL HIGH (ref 70–99)
Glucose-Capillary: 138 mg/dL — ABNORMAL HIGH (ref 70–99)
Glucose-Capillary: 108 mg/dL — ABNORMAL HIGH (ref 70–99)

## 2019-05-29 LAB — CBC
HCT: 49.3 % — ABNORMAL HIGH (ref 36.0–46.0)
Hemoglobin: 16.2 g/dL — ABNORMAL HIGH (ref 12.0–15.0)
MCH: 27.7 pg (ref 26.0–34.0)
MCHC: 32.9 g/dL (ref 30.0–36.0)
MCV: 84.4 fL (ref 80.0–100.0)
Platelets: 209 10*3/uL (ref 150–400)
RBC: 5.84 MIL/uL — ABNORMAL HIGH (ref 3.87–5.11)
RDW: 18 % — ABNORMAL HIGH (ref 11.5–15.5)
WBC: 5.3 10*3/uL (ref 4.0–10.5)
nRBC: 0 % (ref 0.0–0.2)

## 2019-05-29 LAB — BASIC METABOLIC PANEL
Anion gap: 15 (ref 5–15)
BUN: 15 mg/dL (ref 8–23)
CO2: 19 mmol/L — ABNORMAL LOW (ref 22–32)
Calcium: 8.9 mg/dL (ref 8.9–10.3)
Chloride: 104 mmol/L (ref 98–111)
Creatinine, Ser: 1.15 mg/dL — ABNORMAL HIGH (ref 0.44–1.00)
GFR calc Af Amer: 59 mL/min — ABNORMAL LOW (ref >60)
GFR calc non Af Amer: 51 mL/min — ABNORMAL LOW (ref >60)
Glucose, Bld: 118 mg/dL — ABNORMAL HIGH (ref 70–99)
Potassium: 5.5 mmol/L — ABNORMAL HIGH (ref 3.5–5.1)
Sodium: 138 mmol/L (ref 135–145)

## 2019-05-29 LAB — PROTIME-INR
INR: 2.1 — ABNORMAL HIGH (ref 0.8–1.2)
Prothrombin Time: 23.1 seconds — ABNORMAL HIGH (ref 11.4–15.2)

## 2019-05-29 LAB — POTASSIUM: Potassium: 3.9 mmol/L (ref 3.5–5.1)

## 2019-05-29 MED ORDER — WARFARIN SODIUM 1 MG PO TABS
1.5000 mg | ORAL_TABLET | Freq: Once | ORAL | Status: AC
  Administered 2019-05-29: 1.5 mg via ORAL
  Filled 2019-05-29: qty 1

## 2019-05-29 NOTE — Plan of Care (Signed)
  Problem: Self-Care: Goal: Ability to participate in self-care as condition permits will improve Outcome: Progressing Goal: Verbalization of feelings and concerns over difficulty with self-care will improve Outcome: Progressing Goal: Ability to communicate needs accurately will improve Outcome: Progressing   Problem: Ischemic Stroke/TIA Tissue Perfusion: Goal: Complications of ischemic stroke/TIA will be minimized Outcome: Progressing   Problem: Activity: Goal: Risk for activity intolerance will decrease Outcome: Progressing   Problem: Elimination: Goal: Will not experience complications related to bowel motility Outcome: Progressing Goal: Will not experience complications related to urinary retention Outcome: Progressing   Problem: Safety: Goal: Ability to remain free from injury will improve Outcome: Progressing   Problem: Skin Integrity: Goal: Risk for impaired skin integrity will decrease Outcome: Progressing

## 2019-05-29 NOTE — Progress Notes (Signed)
STROKE TEAM PROGRESS NOTE   INTERVAL HISTORY She is walking in room with walker going to bathroom. Interactive and in good spirit. INR today 2.1. K 5.5 in am and repeat in pm was 3.9. still pending [facility].   Vitals:   05/28/19 1935 05/28/19 2336 05/29/19 0320 05/29/19 0452  BP: 120/77 107/86 (!) 138/124 (!) 110/53  Pulse: 87 80 81   Resp: 18 18 18    Temp: 98.4 F (36.9 C) 97.8 F (36.6 C) 97.6 F (36.4 C)   TempSrc: Oral Oral Oral   SpO2: 100% 99% 100%   Weight:      Height:        CBC:  Recent Labs  Lab 05/27/19 0434 05/28/19 0421  WBC 5.7 5.7  HGB 16.2* 15.8*  HCT 49.5* 47.2*  MCV 84.9 84.4  PLT 239 630    Basic Metabolic Panel:  Recent Labs  Lab 05/27/19 0434 05/28/19 0421  NA 141 138  K 4.2 4.5  CL 107 106  CO2 23 22  GLUCOSE 120* 91  BUN 12 14  CREATININE 1.14* 1.15*  CALCIUM 9.4 8.8*   Lipid Panel:     Component Value Date/Time   CHOL 96 05/20/2019 0441   TRIG 90 05/20/2019 0441   HDL 15 (L) 05/20/2019 0441   CHOLHDL 6.4 05/20/2019 0441   VLDL 18 05/20/2019 0441   LDLCALC 63 05/20/2019 0441   HgbA1c:  Lab Results  Component Value Date   HGBA1C 7.2 (H) 05/20/2019   Urine Drug Screen:     Component Value Date/Time   LABOPIA NONE DETECTED 05/19/2019 1454   COCAINSCRNUR NONE DETECTED 05/19/2019 1454   LABBENZ NONE DETECTED 05/19/2019 1454   AMPHETMU NONE DETECTED 05/19/2019 1454   THCU NONE DETECTED 05/19/2019 1454   LABBARB NONE DETECTED 05/19/2019 1454    Alcohol Level     Component Value Date/Time   ETH <10 05/19/2019 1102    IMAGING  Mr Brain Wo Contrast Mr Angio Head Wo Contrast Mr Angio Neck W Wo Contrast 05/20/2019 IMPRESSION:   3.5 cm region of acute infarction in the right insula and temporal lobe. Few other scattered punctate infarctions in the right MCA territory. No mass effect or hemorrhage.   Restoration of flow in the right MCA territory. No new intracranial occlusion.   Atherosclerotic plaque at both carotid  bulb regions but without stenosis.   Tiny/thready right vertebral artery which could be congenital or due to acquired right vertebral disease.   The left vertebral is large vessel that appears widely patent and normal.   Ct Cerebral Perfusion W Contrast 05/19/2019 IMPRESSION:  Nondiagnostic CT perfusion.   Cerebral Angiogram 9/24/22020 S/P RT CCA arteriogram followed by complete revascularization of RT MCA M 1 occlusion with x 1 pass with 19mmx 40 mm Solitaire X retriever device achieving a TICI 3 revascularization.Marland Kitchen   PHYSICAL EXAM  Temp:  [97.6 F (36.4 C)-98.4 F (36.9 C)] 97.6 F (36.4 C) (10/04 0320) Pulse Rate:  [60-87] 81 (10/04 0320) Resp:  [16-18] 18 (10/04 0320) BP: (96-138)/(53-124) 110/53 (10/04 0452) SpO2:  [97 %-100 %] 100 % (10/04 0320)  Obese middle-aged Caucasian lady not in distress. Afebrile. Head is nontraumatic. Neck is supple without bruit. Cardiac exam no murmur or gallop. Lungs are clear to auscultation. Distal pulses are well felt. Neurological Exam : Awake alert, walking in room with walker, following commands, oriented to name and place, wrong on month, no aphasia, no dysarthria. Exophthalmos, left partial hemianopsia, EOMI, face symmetric, tongue protrusion  midline, moving all extremities equally, 1+ patellars and biceps, toes equiv. Sensation symmetrical. FTN intact. normal station on standing, able to walk with walker, no fall tendency, slow stride but stable, no hemiparetic gait.  ASSESSMENT/PLAN [name] is a 63 y.o. female with history of HTN, HLD, CHF, pre-diabetes, morbid obesity, LBBB and GERD presenting to [facility] who woke with L sided weakness, L hemisensory deficit, speech difficulty and L facial droop. To [facility] for IR.   Stroke: R MCA infarct due to right M1 occlusion s/p IR with TICI3 reperfusion - embolic secondary to severe cardiomyopathy with low EF  CT head [place]) no hypodensity  CTA head & neck ([place]) R MCA  occlusion   CT perfusion nondiagnostic  Cerebral angio R M1 occlusion w/ TICI3 revascularization    Post IR CT no ICH or gross mass effect   MRI - 3.5 cm region of acute infarction in the right insula and temporal lobe. Few other scattered punctate infarctions in the right MCA territory.  MRA H&N - Restoration of flow in the right MCA territory. No new intracranial occlusion. Atherosclerotic plaque at both carotid bulb regions but without stenosis. CT Perfusion -  Non diagnostic  2D Echo EF < 20%  LE venous doppler - no DVT  LDL 63  HgbA1c 7.2  SCDs for VTE prophylaxis. Added Lovenox 40 mg sq daily    aspirin 81 mg daily prior to admission, now on coumadin due to low EF. INR goal 2-3  Therapy recommendations:  CIR  Disposition:  pending [facility] on Monday - repeat COVID pending  Chronic systolic Congestive heart failure HFrEF w/ NICM  EF < 20%  Home meds:  lasix 40, spironolactone 12.5, coreg 6.25  BNP 3910.8   B/l pedal edema  Cardiology consulted for low EF  (appreciate cardiology)  Anticoagulation for stroke prevention. Per Cards, coumadin preferred.  On coumadin INR goal 2-3  INR 2.3->2.1->2.1  Hypertension  Home meds:  Coreg 6.25 bid . BP mildly low c/w low EF  On lasix 40, spironolactone 12.5, coreg 6.25 . Long-term BP goal normotensive  Hyperlipidemia  Home meds:  lipitor 10  LDL 63, goal < 70  resumed Lipitor 10 mg daily  Continue statin at discharge  Diabetes type II Uncontrolled  Home meds:  none  HgbA1c 7.2, goal < 7.0  CBGs  SSI  PCP close follow up  Dysphagia, resolved . Secondary to stroke . On diet now . Speech on board . On home meds    Other Stroke Risk Factors  Morbid Obesity, Body mass index is 37.85 kg/m., recommend weight loss, diet and exercise as appropriate   Other Active Problems  Exophthalmos, TSH normal  R hip xray neg for fx  Creatinine - 1.15  Hospital day # 10   Marvel Plan, MD PhD Stroke  Neurology 05/29/2019 7:43 PM  To contact Stroke Continuity provider, please refer to [email or web address]. After hours, contact General Neurology

## 2019-05-29 NOTE — Plan of Care (Signed)

## 2019-05-29 NOTE — Progress Notes (Signed)
ANTICOAGULATION CONSULT NOTE  Pharmacy Consult: warfarin Indication: stroke   Patient Measurements: Height: 5\' 8"  (172.7 cm) Weight: 248 lb 14.4 oz (112.9 kg) IBW/kg (Calculated) : 63.9  Vital Signs: Temp: 98.2 F (36.8 C) (10/04 0844) Temp Source: Oral (10/04 0844) BP: 105/85 (10/04 0844) Pulse Rate: 75 (10/04 0844)  Labs: Recent Labs    05/27/19 0434 05/28/19 0421 05/29/19 0658 05/29/19 1108  HGB 16.2* 15.8* 16.2*  --   HCT 49.5* 47.2* 49.3*  --   PLT 239 225 209  --   LABPROT 25.3* 23.2*  --  23.1*  INR 2.3* 2.1*  --  2.1*  CREATININE 1.14* 1.15* 1.15*  --     Assessment: 71 YOF who presented on 9/24 with acute R-MCA occlusion s/p revascularization. The patient was also noted to have a low EF<20%. Warfarin indicated for CVA prevention  INR 2.1 CBC stable  Goal of Therapy:  INR 2-3 Monitor platelets by anticoagulation protocol: Yes   Plan:  Warfarin 1.5 mg x 1 Daily INR  Levester Fresh, PharmD, BCPS, BCCCP Clinical Pharmacist [phone]  Please check AMION for all [place] numbers  05/29/2019 12:34 PM

## 2019-05-30 DIAGNOSIS — I639 Cerebral infarction, unspecified: Secondary | ICD-10-CM

## 2019-05-30 DIAGNOSIS — Z8669 Personal history of other diseases of the nervous system and sense organs: Secondary | ICD-10-CM | POA: Insufficient documentation

## 2019-05-30 DIAGNOSIS — Z8673 Personal history of transient ischemic attack (TIA), and cerebral infarction without residual deficits: Secondary | ICD-10-CM

## 2019-05-30 DIAGNOSIS — Z789 Other specified health status: Secondary | ICD-10-CM | POA: Insufficient documentation

## 2019-05-30 DIAGNOSIS — R9430 Abnormal result of cardiovascular function study, unspecified: Secondary | ICD-10-CM

## 2019-05-30 DIAGNOSIS — E1165 Type 2 diabetes mellitus with hyperglycemia: Secondary | ICD-10-CM

## 2019-05-30 DIAGNOSIS — R931 Abnormal findings on diagnostic imaging of heart and coronary circulation: Secondary | ICD-10-CM | POA: Insufficient documentation

## 2019-05-30 DIAGNOSIS — Z7409 Other reduced mobility: Secondary | ICD-10-CM

## 2019-05-30 HISTORY — DX: Cerebral infarction, unspecified: I63.9

## 2019-05-30 HISTORY — DX: Other reduced mobility: Z74.09

## 2019-05-30 HISTORY — DX: Abnormal result of cardiovascular function study, unspecified: R94.30

## 2019-05-30 HISTORY — DX: Personal history of transient ischemic attack (TIA), and cerebral infarction without residual deficits: Z86.73

## 2019-05-30 HISTORY — DX: Personal history of other diseases of the nervous system and sense organs: Z86.69

## 2019-05-30 HISTORY — DX: Other specified health status: Z78.9

## 2019-05-30 HISTORY — DX: Abnormal findings on diagnostic imaging of heart and coronary circulation: R93.1

## 2019-05-30 HISTORY — DX: Type 2 diabetes mellitus with hyperglycemia: E11.65

## 2019-05-30 LAB — GLUCOSE, CAPILLARY
Glucose-Capillary: 99 mg/dL (ref 70–99)
Glucose-Capillary: 111 mg/dL — ABNORMAL HIGH (ref 70–99)
Glucose-Capillary: 151 mg/dL — ABNORMAL HIGH (ref 70–99)
Glucose-Capillary: 124 mg/dL — ABNORMAL HIGH (ref 70–99)
Glucose-Capillary: 144 mg/dL — ABNORMAL HIGH (ref 70–99)
Glucose-Capillary: 84 mg/dL (ref 70–99)
Glucose-Capillary: 82 mg/dL (ref 70–99)

## 2019-05-30 LAB — CBC
HCT: 48.2 % — ABNORMAL HIGH (ref 36.0–46.0)
Hemoglobin: 15.9 g/dL — ABNORMAL HIGH (ref 12.0–15.0)
MCH: 28.2 pg (ref 26.0–34.0)
MCHC: 33 g/dL (ref 30.0–36.0)
MCV: 85.5 fL (ref 80.0–100.0)
Platelets: 262 10*3/uL (ref 150–400)
RBC: 5.64 MIL/uL — ABNORMAL HIGH (ref 3.87–5.11)
RDW: 18.2 % — ABNORMAL HIGH (ref 11.5–15.5)
WBC: 5.4 10*3/uL (ref 4.0–10.5)
nRBC: 0 % (ref 0.0–0.2)

## 2019-05-30 LAB — BASIC METABOLIC PANEL
Anion gap: 16 — ABNORMAL HIGH (ref 5–15)
BUN: 13 mg/dL (ref 8–23)
CO2: 17 mmol/L — ABNORMAL LOW (ref 22–32)
Calcium: 8.7 mg/dL — ABNORMAL LOW (ref 8.9–10.3)
Chloride: 106 mmol/L (ref 98–111)
Creatinine, Ser: 1 mg/dL (ref 0.44–1.00)
GFR calc Af Amer: >60 mL/min (ref >60)
GFR calc non Af Amer: 60 mL/min — ABNORMAL LOW (ref >60)
Glucose, Bld: 122 mg/dL — ABNORMAL HIGH (ref 70–99)
Potassium: 3.9 mmol/L (ref 3.5–5.1)
Sodium: 139 mmol/L (ref 135–145)

## 2019-05-30 LAB — NOVEL CORONAVIRUS, NAA (HOSP ORDER, SEND-OUT TO REF LAB; TAT 18-24 HRS): SARS-CoV-2, NAA: NOT DETECTED

## 2019-05-30 LAB — PROTIME-INR
INR: 1.9 — ABNORMAL HIGH (ref 0.8–1.2)
Prothrombin Time: 21.4 seconds — ABNORMAL HIGH (ref 11.4–15.2)

## 2019-05-30 MED ORDER — WARFARIN SODIUM 2 MG PO TABS
2.0000 mg | ORAL_TABLET | Freq: Once | ORAL | Status: DC
  Filled 2019-05-30: qty 1

## 2019-05-30 MED ORDER — FUROSEMIDE 40 MG PO TABS: 40.0000 mg | ORAL_TABLET | Freq: Every day | ORAL | 1 refills | Status: DC

## 2019-05-30 MED ORDER — WARFARIN SODIUM 2 MG PO TABS: 2.0000 mg | ORAL_TABLET | Freq: Once | ORAL | Status: DC

## 2019-05-30 NOTE — Anesthesia Postprocedure Evaluation (Signed)
Anesthesia Post Note  Patient: Richanda Darin  Procedure(s) Performed: IR WITH ANESTHESIA (N/A )     Patient location during evaluation: SICU Anesthesia Type: General Level of consciousness: sedated Pain management: pain level controlled Vital Signs Assessment: post-procedure vital signs reviewed and stable Respiratory status: patient remains intubated per anesthesia plan Cardiovascular status: stable Postop Assessment: no apparent nausea or vomiting Anesthetic complications: no    Last Vitals:  Vitals:   05/29/19 2324 05/30/19 0401  BP: 114/87 108/75  Pulse: 73 76  Resp: 18 16  Temp: 36.7 C (!) 36.4 C  SpO2: 100% 100%    Last Pain:  Vitals:   05/30/19 0401  TempSrc: Oral  PainSc:                  Karlton Maya S

## 2019-05-30 NOTE — Plan of Care (Signed)

## 2019-05-30 NOTE — Progress Notes (Signed)
Report called to [facility] inpatient rehab at this time and given to [facility]

## 2019-05-30 NOTE — Progress Notes (Signed)
ANTICOAGULATION CONSULT NOTE  Pharmacy Consult: warfarin Indication: stroke   Patient Measurements: Height: 5\' 8"  (172.7 cm) Weight: 248 lb 14.4 oz (112.9 kg) IBW/kg (Calculated) : 63.9  Vital Signs: Temp: 97.6 F (36.4 C) (10/05 0831) Temp Source: Oral (10/05 0831) BP: 95/78 (10/05 0831) Pulse Rate: 75 (10/05 0831)  Labs: Recent Labs    05/28/19 0421 05/29/19 0658 05/29/19 1108 05/30/19 0952  HGB 15.8* 16.2*  --  15.9*  HCT 47.2* 49.3*  --  48.2*  PLT 225 209  --  262  LABPROT 23.2*  --  23.1* 21.4*  INR 2.1*  --  2.1* 1.9*  CREATININE 1.15* 1.15*  --   --     Assessment: 50 YOF who presented on 9/24 with acute R-MCA occlusion s/p revascularization. The patient was also noted to have a low EF<20%. Warfarin indicated for CVA prevention  INR slightly sub-therapeutic today; no bleeding reported.  Goal of Therapy:  INR 2-3 Monitor platelets by anticoagulation protocol: Yes   Plan:  Coumadin 2mg  PO today if still here Daily PT / INR  Eva Griffo D. Mina Marble, PharmD, BCPS, [place] 05/30/2019, 10:51 AM

## 2019-05-30 NOTE — TOC Transition Note (Signed)
Transition of Care [facility]) - CM/SW Discharge Note   Patient Details  Name: Breanna Bennett MRN: [id number] Date of Birth: [birth year]  [facility] CM/SW Contact:  Pollie Friar, RN Phone Number: 05/30/2019, 9:55 AM   Clinical Narrative:    Pt is discharging to inpatient rehab at [facility]. Pt to travel via [place]. D/c packet at the desk. Will call daughter and update the patient. Needed information faxed to [facility].  Room: C412 Number for report: [phone]     Barriers to Discharge: Continued Medical Work up, Pharmacist, community is out of network with her insurance.)   Patient Goals and CMS Choice   CMS Medicare.gov Compare Post Acute Care list provided to:: Patient Represenative (must comment) Choice offered to / list presented to : Patient, Adult Children  Discharge Placement                       Discharge Plan and Services   Discharge Planning Services: CM Consult                                 Social Determinants of Health (SDOH) Interventions     Readmission Risk Interventions No flowsheet data found.

## 2019-05-30 NOTE — Progress Notes (Signed)
Inpatient Rehabilitation-Admissions Coordinator   Noted pt has plans to DC to [facility] today. Will sign off.   Jhonnie Garner, OTR/L  Rehab Admissions Coordinator  [phone] 05/30/2019 8:40 AM

## 2019-06-06 DIAGNOSIS — E790 Hyperuricemia without signs of inflammatory arthritis and tophaceous disease: Secondary | ICD-10-CM

## 2019-06-06 HISTORY — DX: Hyperuricemia without signs of inflammatory arthritis and tophaceous disease: E79.0

## 2019-06-16 ENCOUNTER — Other Ambulatory Visit: Payer: Self-pay

## 2019-06-16 ENCOUNTER — Telehealth: Payer: Self-pay | Admitting: Pharmacist

## 2019-06-16 ENCOUNTER — Ambulatory Visit (INDEPENDENT_AMBULATORY_CARE_PROVIDER_SITE_OTHER): Payer: BLUE CROSS/BLUE SHIELD | Admitting: Cardiology

## 2019-06-16 ENCOUNTER — Encounter: Payer: Self-pay | Admitting: Cardiology

## 2019-06-16 VITALS — BP 110/80 | HR 89 | Ht 68.0 in | Wt 242.0 lb

## 2019-06-16 DIAGNOSIS — E1165 Type 2 diabetes mellitus with hyperglycemia: Secondary | ICD-10-CM

## 2019-06-16 DIAGNOSIS — E782 Mixed hyperlipidemia: Secondary | ICD-10-CM

## 2019-06-16 DIAGNOSIS — Z9229 Personal history of other drug therapy: Secondary | ICD-10-CM

## 2019-06-16 DIAGNOSIS — I6601 Occlusion and stenosis of right middle cerebral artery: Secondary | ICD-10-CM

## 2019-06-16 DIAGNOSIS — I5042 Chronic combined systolic (congestive) and diastolic (congestive) heart failure: Secondary | ICD-10-CM

## 2019-06-16 DIAGNOSIS — I63411 Cerebral infarction due to embolism of right middle cerebral artery: Secondary | ICD-10-CM | POA: Diagnosis not present

## 2019-06-16 DIAGNOSIS — I428 Other cardiomyopathies: Secondary | ICD-10-CM

## 2019-06-16 HISTORY — DX: Personal history of other drug therapy: Z92.29

## 2019-06-16 MED ORDER — POLYVINYL ALCOHOL 1.4 % OP SOLN
1.00 | OPHTHALMIC | Status: DC
Start: ? — End: 2019-06-16

## 2019-06-16 MED ORDER — SPIRONOLACTONE 25 MG PO TABS
12.50 | ORAL_TABLET | ORAL | Status: DC
Start: 2019-06-16 — End: 2019-06-16

## 2019-06-16 MED ORDER — ALBUTEROL SULFATE HFA 108 (90 BASE) MCG/ACT IN AERS
2.00 | INHALATION_SPRAY | RESPIRATORY_TRACT | Status: DC
Start: ? — End: 2019-06-16

## 2019-06-16 MED ORDER — ALBUTEROL SULFATE (2.5 MG/3ML) 0.083% IN NEBU
2.50 | INHALATION_SOLUTION | RESPIRATORY_TRACT | Status: DC
Start: ? — End: 2019-06-16

## 2019-06-16 MED ORDER — FLUTICASONE PROPIONATE 50 MCG/ACT NA SUSP
1.00 | NASAL | Status: DC
Start: 2019-06-16 — End: 2019-06-16

## 2019-06-16 MED ORDER — INDOMETHACIN 25 MG PO CAPS
25.00 | ORAL_CAPSULE | ORAL | Status: DC
Start: ? — End: 2019-06-16

## 2019-06-16 MED ORDER — ATORVASTATIN CALCIUM 10 MG PO TABS
10.00 | ORAL_TABLET | ORAL | Status: DC
Start: 2019-06-15 — End: 2019-06-16

## 2019-06-16 MED ORDER — MAGNESIUM OXIDE 400 MG PO TABS
400.00 | ORAL_TABLET | ORAL | Status: DC
Start: 2019-06-15 — End: 2019-06-16

## 2019-06-16 MED ORDER — PANTOPRAZOLE SODIUM 40 MG PO TBEC
40.00 | DELAYED_RELEASE_TABLET | ORAL | Status: DC
Start: 2019-06-16 — End: 2019-06-16

## 2019-06-16 MED ORDER — VITAMIN D (ERGOCALCIFEROL) 1.25 MG (50000 UNIT) PO CAPS
50000.00 | ORAL_CAPSULE | ORAL | Status: DC
Start: 2019-06-20 — End: 2019-06-16

## 2019-06-16 MED ORDER — MONTELUKAST SODIUM 10 MG PO TABS
10.00 | ORAL_TABLET | ORAL | Status: DC
Start: 2019-06-15 — End: 2019-06-16

## 2019-06-16 MED ORDER — SENNOSIDES-DOCUSATE SODIUM 8.6-50 MG PO TABS
1.00 | ORAL_TABLET | ORAL | Status: DC
Start: ? — End: 2019-06-16

## 2019-06-16 MED ORDER — ACETAMINOPHEN 325 MG PO TABS
650.00 | ORAL_TABLET | ORAL | Status: DC
Start: ? — End: 2019-06-16

## 2019-06-16 MED ORDER — POLYETHYLENE GLYCOL 3350 17 G PO PACK
17.00 | PACK | ORAL | Status: DC
Start: 2019-06-16 — End: 2019-06-16

## 2019-06-16 MED ORDER — ALLOPURINOL 100 MG PO TABS
100.00 | ORAL_TABLET | ORAL | Status: DC
Start: 2019-06-15 — End: 2019-06-16

## 2019-06-16 MED ORDER — WARFARIN SODIUM 5 MG PO TABS
5.00 | ORAL_TABLET | ORAL | Status: DC
Start: ? — End: 2019-06-16

## 2019-06-16 MED ORDER — CARVEDILOL 3.125 MG PO TABS
6.25 | ORAL_TABLET | ORAL | Status: DC
Start: 2019-06-15 — End: 2019-06-16

## 2019-06-16 MED ORDER — FAMOTIDINE 20 MG PO TABS
40.00 | ORAL_TABLET | ORAL | Status: DC
Start: 2019-06-15 — End: 2019-06-16

## 2019-06-16 MED ORDER — FUROSEMIDE 40 MG PO TABS
40.00 | ORAL_TABLET | ORAL | Status: DC
Start: 2019-06-16 — End: 2019-06-16

## 2019-06-16 NOTE — Patient Instructions (Addendum)
Medication Instructions:  Your physician recommends that you continue on your current medications as directed. Please refer to the Current Medication list given to you today.  *If you need a refill on your cardiac medications before your next appointment, please call your pharmacy*  Lab Work: Your physician recommends that you return for lab work in: TODAY BMP,Magnesium,INR  If you have labs (blood work) drawn today and your tests are completely normal, you will receive your results only by: Marland Kitchen MyChart Message (if you have MyChart) OR . A paper copy in the mail If you have any lab test that is abnormal or we need to change your treatment, we will call you to review the results.  Testing/Procedures: None  Follow-Up: At [facility], you and your health needs are our priority.  As part of our continuing mission to provide you with exceptional heart care, we have created designated Provider Care Teams.  These Care Teams include your primary Cardiologist (physician) and Advanced Practice Providers (APPs -  Physician Assistants and Nurse Practitioners) who all work together to provide you with the care you need, when you need it.  Your next appointment:   1 month  The format for your next appointment:   In Person  Provider:   Gypsy Balsam, MD or Thomasene Ripple, DO  Other Instructions  You are being referred to the coumadin clinic here. The will contact you with a date and time for an appointment.    Vitamin K Foods and Warfarin Warfarin is a blood thinner (anticoagulant). Anticoagulant medicines help prevent the formation of blood clots. These medicines work by decreasing the activity of vitamin K, which promotes normal blood clotting. When you take warfarin, problems can occur from suddenly increasing or decreasing the amount of vitamin K that you eat from one day to the next. Problems may include:  Blood clots.  Bleeding. What general guidelines do I need to follow? To avoid  problems when taking warfarin:  Eat a balanced diet that includes: ? Fresh fruits and vegetables. ? Whole grains. ? Low-fat dairy products. ? Lean proteins, such as fish, eggs, and lean cuts of meat.  Keep your intake of vitamin K consistent from day to day. To do this: ? Avoid eating large amounts of vitamin K one day and low amounts of vitamin K the next day. ? If you take a multivitamin that contains vitamin K, be sure to take it every day. ? Know which foods contain vitamin K. Use the lists below to understand serving sizes and the amount of vitamin K in one serving.  Avoid major changes in your diet. If you are going to change your diet, talk with your health care provider before making changes.  Work with a Dealer (dietitian) to develop a meal plan that works best for you.  High vitamin K foods Foods that are high in vitamin K contain more than 100 mcg (micrograms) per serving. These include:  Broccoli (cooked) -  cup has 110 mcg.  Brussels sprouts (cooked) -  cup has 109 mcg.  Greens, beet (cooked) -  cup has 350 mcg.  Greens, collard (cooked) -  cup has 418 mcg.  Greens, turnip (cooked) -  cup has 265 mcg.  Green onions or scallions -  cup has 105 mcg.  Kale (fresh or frozen) -  cup has 531 mcg.  Parsley (raw) - 10 sprigs has 164 mcg.  Spinach (cooked) -  cup has 444 mcg.  Swiss chard (cooked) -  cup has 287 mcg. Moderate vitamin K foods Foods that have a moderate amount of vitamin K contain 25-100 mcg per serving. These include:  Asparagus (cooked) - 5 spears have 38 mcg.  Black-eyed peas (dried) -  cup has 32 mcg.  Cabbage (cooked) -  cup has 37 mcg.  Kiwi fruit - 1 medium has 31 mcg.  Lettuce - 1 cup has 57-63 mcg.  Okra (frozen) -  cup has 44 mcg.  Prunes (dried) - 5 prunes have 25 mcg.  Watercress (raw) - 1 cup has 85 mcg. Low vitamin K foods Foods low in vitamin K contain less than 25 mcg per serving. These include:   Artichoke - 1 medium has 18 mcg.  Avocado - 1 oz. has 6 mcg.  Blueberries -  cup has 14 mcg.  Cabbage (raw) -  cup has 21 mcg.  Carrots (cooked) -  cup has 11 mcg.  Cauliflower (raw) -  cup has 11 mcg.  Cucumber with peel (raw) -  cup has 9 mcg.  Grapes -  cup has 12 mcg.  Mango - 1 medium has 9 mcg.  Nuts - 1 oz. has 15 mcg.  Pear - 1 medium has 8 mcg.  Peas (cooked) -  cup has 19 mcg.  Pickles - 1 spear has 14 mcg.  Pumpkin seeds - 1 oz. has 13 mcg.  Sauerkraut (canned) -  cup has 16 mcg.  Soybeans (cooked) -  cup has 16 mcg.  Tomato (raw) - 1 medium has 10 mcg.  Tomato sauce -  cup has 17 mcg. Vitamin K-free foods If a food contain less than 5 mcg per serving, it is considered to have no vitamin K. These foods include:  Bread and cereal products.  Cheese.  Eggs.  Fish and shellfish.  Meat and poultry.  Milk and dairy products.  Sunflower seeds. Actual amounts of vitamin K in foods may be different depending on processing. Talk with your dietitian about what foods you can eat and what foods you should avoid. This information is not intended to replace advice given to you by your health care provider. Make sure you discuss any questions you have with your health care provider. Document Released: 06/08/2009 Document Revised: 07/24/2017 Document Reviewed: 11/14/2015 Elsevier Patient Education  2020 [employer] Warfarin tablets What is this medicine? WARFARIN (WAR far in) is an anticoagulant. It is used to treat or prevent clots in the veins, arteries, lungs, or heart. This medicine may be used for other purposes; ask your health care provider or pharmacist if you have questions. COMMON BRAND NAME(S): Coumadin, Jantoven What should I tell my health care provider before I take this medicine? They need to know if you have any of these conditions:  alcoholism  anemia  bleeding disorders  cancer  diabetes  heart disease  high blood  pressure  history of bleeding in the gastrointestinal tract  history of stroke or other brain injury or disease  kidney or liver disease  protein C deficiency  protein S deficiency  psychosis or dementia  recent injury, recent or planned surgery or procedure  an unusual or allergic reaction to warfarin, other medicines, foods, dyes, or preservatives  pregnant or trying to get pregnant  breast-feeding How should I use this medicine? Take this medicine by mouth with a glass of water. Follow the directions on the prescription label. You can take this medicine with or without food. Take your medicine at the same time each day. Do not take  it more often than directed. Do not stop taking except on your doctor's advice. Stopping this medicine may increase your risk of a blood clot. Be sure to refill your prescription before you run out of medicine. If your doctor or healthcare professional calls to change your dose, write down the dose and any other instructions. Always read the dose and instructions back to him or her to make sure you understand them. Tell your doctor or healthcare professional what strength of tablets you have on hand. Ask how many tablets you should take to equal your new dose. Write the date on the new instructions and keep them near your medicine. If you are told to stop taking your medicine until your next blood test, call your doctor or healthcare professional if you do not hear anything within 24 hours of the test to find out your new dose or when to restart your prior dose. A special MedGuide will be given to you by the pharmacist with each prescription and refill. Be sure to read this information carefully each time. Talk to your pediatrician regarding the use of this medicine in children. Special care may be needed. Overdosage: If you think you have taken too much of this medicine contact a poison control center or emergency room at once. NOTE: This medicine is only  for you. Do not share this medicine with others. What if I miss a dose? It is important not to miss a dose. If you miss a dose, call your healthcare provider. Take the dose as soon as possible on the same day. If it is almost time for your next dose, take only that dose. Do not take double or extra doses to make up for a missed dose. What may interact with this medicine? Do not take this medicine with any of the following medications:  agents that prevent or dissolve blood clots  aspirin or other salicylates  danshen  dextrothyroxine  mifepristone  St. John's Wort  red yeast rice This medicine may also interact with the following medications:  acetaminophen  agents that lower cholesterol  alcohol  allopurinol  amiodarone  antibiotics or medicines for treating bacterial, fungal or viral infections  azathioprine  barbiturate medicines for inducing sleep or treating seizures  certain medicines for diabetes  certain medicines for heart rhythm problems  certain medicines for hepatitis C virus infections like daclatasvir, dasabuvir; ombitasvir; paritaprevir; ritonavir, elbasvir; grazoprevir, ledipasvir; sofosbuvir, simeprevir, sofosbuvir, sofosbuvir; velpatasvir, sofosbuvir; velpatasvir; voxilaprevir  certain medicines for high blood pressure  chloral hydrate  cisapride  conivaptan  disulfiram  female hormones, including contraceptive or birth control pills  general anesthetics  herbal or dietary products like garlic, ginkgo, ginseng, green tea, or kava kava  influenza virus vaccine  female hormones  medicines for mental depression or psychosis  medicines for some types of cancer  medicines for stomach problems  methylphenidate  NSAIDs, medicines for pain and inflammation, like ibuprofen or naproxen  propoxyphene  quinidine, quinine  raloxifene  seizure or epilepsy medicine like carbamazepine, phenytoin, and valproic acid  steroids like  cortisone and prednisone  tamoxifen  thyroid medicine  tramadol  vitamin c, vitamin e, and vitamin K  zafirlukast  zileuton This list may not describe all possible interactions. Give your health care provider a list of all the medicines, herbs, non-prescription drugs, or dietary supplements you use. Also tell them if you smoke, drink alcohol, or use illegal drugs. Some items may interact with your medicine. What should I watch for while using  this medicine? Visit your healthcare professional for regular checks on your progress. You will need to have a blood test called a PT/INR regularly. The PT/INR blood test is done to make sure you are getting the right dose of this medicine. It is important to not miss your appointment for the blood tests. When you first start taking this medicine, these tests are done often. Once the correct dose is determined and you take your medicine properly, these tests can be done less often. Wear a medical ID bracelet or chain, and carry a card that describes your disease and details of your medicine and dosage times. Do not start taking or stop taking any medicines or over-the-counter medicines except on the advice of your healthcare professional. You should discuss your diet with your healthcare professional. Do not make major changes in your diet. Vitamin K can affect how well this medicine works. Many foods contain vitamin K. It is important to eat a consistent amount of foods with vitamin K. Other foods with vitamin K that you should eat in consistent amounts are asparagus, basil, black-eyed peas, broccoli, brussel sprouts, cabbage, green onions, green tea, parsley, green leafy vegetables like beet greens, collard greens, kale, spinach, turnip greens, or certain lettuces like green leaf or romaine. This medicine can cause birth defects or bleeding in an unborn child. Women of childbearing age should use effective birth control while taking this medicine. If a  woman becomes pregnant while taking this medicine, she should discuss the potential risks and her options with her healthcare professional. Avoid sports and activities that might cause injury while you are using this medicine. Severe falls or injuries can cause unseen bleeding. Be careful when using sharp tools or knives. Consider using an Neurosurgeonelectric razor. Take special care brushing or flossing your teeth. Report any injuries, bruising, or red spots on the skin to your healthcare professional. If you have an illness that causes vomiting, diarrhea, or fever for more than a few days, contact your health care professional. Also, check with your healthcare professional if you are unable to eat for several days. These problems can change the effect of this medicine. Even after you stop taking this medicine, it takes several days before your body recovers its normal ability to clot blood. Ask your healthcare professional how long you need to be careful. If you are going to have surgery or dental work, tell your health care professional that you have been taking this medicine. What side effects may I notice from receiving this medicine? Side effects that you should report to your doctor or health care professional as soon as possible:  allergic reactions like skin rash, itching or hives, swelling of the face, lips, or tongue  heavy menstrual bleeding or vaginal bleeding  painful, blue or purple toes  painful skin ulcers that do not go away  signs and symptoms of bleeding such as bloody or black, tarry stools; red or dark-brown urine; spitting up blood or brown material that looks like coffee grounds; red spots on the skin; unusual bruising or bleeding from the eye, gums, or nose  signs and symptoms of a blood clot such as chest pain; shortness of breath; pain, swelling, or warmth in the leg  signs and symptoms of a stroke such as changes in vision; confusion; trouble speaking or understanding; severe  headaches; sudden numbness or weakness of the face, arm or leg; trouble walking; dizziness; loss of coordination  stomach pain  unusually weak or tired Side effects that  usually do not require medical attention (report to your doctor or health care professional if they continue or are bothersome):  diarrhea  hair loss This list may not describe all possible side effects. Call your doctor for medical advice about side effects. You may report side effects to FDA at 1-800-FDA-1088. Where should I keep my medicine? Keep out of the reach of children. Store at room temperature between 15 and 30 degrees C (59 and 86 degrees F). Protect from light. Throw away any unused medicine after the expiration date. Do not flush down the toilet. NOTE: This sheet is a summary. It may not cover all possible information. If you have questions about this medicine, talk to your doctor, pharmacist, or health care provider.  2020 Elsevier/Gold Standard (2017-05-27 13:06:45)

## 2019-06-16 NOTE — Progress Notes (Signed)
Cardiology Office Note:    Date:  06/16/2019   ID:  Breanna Bennett, DOB 1956/07/18, MRN [id number]  PCP:  Laurel Dimmer, FNP  Cardiologist:  Gypsy Balsam, MD  Electrophysiologist:  None   Referring MD: Selena Batten *   Chief Complaint  Patient presents with   Follow-up    Medication and blood work/stroke    History of Present Illness:    Breanna Bennett is a 63 y.o. female with a hx of recent CVA (right MCA infarct from embolization) status post mechanical thrombectomy, chronic systolic heart failure, obesity, hyperlipidemia, type 2 diabetes presents to be evaluated posthospitalization.  She is a patient of Dr. Bing Matter, and last saw him on  The patient her daughter tells me that she was home right after taking a shower she had an acute fall.  Once the family went to look at the patient states the recognize immediately the right facial droop call 911.  She was brought to [facility] and she was evaluated by teleneurology at that time she was no longer a TPA candidate.  CT of the head and neck did reveal MCA occlusion.  She was then transferred to [facility] for CT perfusion study and possible retrieval of her clot by IR.  Per record she did have a successful mechanical lobectomy.  Post hospitalization she was at a rehab and was just discharged yesterday from the rehab. She offers no complaints.  She is happy with her recovery.  She denies any chest pain, shortness of breath, nausea, vomiting.  Past Medical History:  Diagnosis Date   Chronic systolic CHF (congestive heart failure) (HCC)    Elevated hemoglobin (HCC)    GERD (gastroesophageal reflux disease)    Hyperlipidemia    Hypertension    LBBB (left bundle branch block)    Mild renal insufficiency    Mitral regurgitation    NICM (nonischemic cardiomyopathy) (HCC)    Pre-diabetes    Tricuspid regurgitation     Past Surgical History:  Procedure Laterality Date   IR  ANGIO VERTEBRAL SEL SUBCLAVIAN INNOMINATE UNI R MOD SED  05/19/2019   IR CT HEAD LTD  05/19/2019   IR PERCUTANEOUS ART THROMBECTOMY/INFUSION INTRACRANIAL INC DIAG ANGIO  05/19/2019   RADIOLOGY WITH ANESTHESIA N/A 05/19/2019   Procedure: IR WITH ANESTHESIA;  Surgeon: Julieanne Cotton, MD;  Location: [facility] OR;  Service: Radiology;  Laterality: N/A;   RIGHT/LEFT HEART CATH AND CORONARY ANGIOGRAPHY N/A 01/13/2019   Procedure: RIGHT/LEFT HEART CATH AND CORONARY ANGIOGRAPHY;  Surgeon: Runell Gess, MD;  Location: [facility] INVASIVE CV LAB;  Service: Cardiovascular;  Laterality: N/A;   TUBAL LIGATION      Current Medications: Current Meds  Medication Sig   albuterol (VENTOLIN HFA) 108 (90 Base) MCG/ACT inhaler Inhale 2 puffs into the lungs every 4 (four) hours as needed for shortness of breath.   allopurinol (ZYLOPRIM) 100 MG tablet Take 100 mg by mouth 2 (two) times daily.   atorvastatin (LIPITOR) 10 MG tablet Take 10 mg by mouth daily.   carvedilol (COREG) 6.25 MG tablet TAKE 1 TABLET(6.25 MG) BY MOUTH TWICE DAILY WITH A MEAL (Patient taking differently: Take 6.25 mg by mouth 2 (two) times daily with a meal. )   famotidine (PEPCID) 40 MG tablet Take 40 mg by mouth at bedtime.    fluticasone (FLONASE) 50 MCG/ACT nasal spray Place 2 sprays into both nostrils daily as needed for allergies or rhinitis.    furosemide (LASIX) 40 MG tablet Take 1 tablet (40  mg total) by mouth daily. (Patient taking differently: Take 20 mg by mouth daily. )   indomethacin (INDOCIN) 25 MG capsule Take 25 mg by mouth 3 (three) times daily as needed.   loratadine (CLARITIN) 10 MG tablet Take 10 mg by mouth daily as needed for allergies or rhinitis.    magnesium oxide (MAG-OX) 400 MG tablet Take 400 mg by mouth 3 (three) times daily.   montelukast (SINGULAIR) 10 MG tablet Take 10 mg by mouth at bedtime.   omeprazole (PRILOSEC) 20 MG capsule Take 20 mg by mouth daily.   potassium chloride (KLOR-CON) 10 MEQ tablet  Take 10 mEq by mouth daily.   spironolactone (ALDACTONE) 25 MG tablet Take 0.5 tablets (12.5 mg total) by mouth daily.   Vitamin D, Ergocalciferol, (DRISDOL) 1.25 MG (50000 UT) CAPS capsule Take 50,000 Units by mouth every Monday.    warfarin (COUMADIN) 5 MG tablet Take 5 mg by mouth daily.   [DISCONTINUED] warfarin (COUMADIN) 2 MG tablet Take 1 tablet (2 mg total) by mouth one time only at 6 PM. Continue daily dosing at [facility] rehab for goal INR 2-3.     Allergies:   Lisinopril and Losartan   Social History   Socioeconomic History   Marital status: Unknown    Spouse name: Not on file   Number of children: Not on file   Years of education: Not on file   Highest education level: Not on file  Occupational History   Not on file  Social Needs   Financial resource strain: Not on file   Food insecurity    Worry: Not on file    Inability: Not on file   Transportation needs    Medical: Not on file    Non-medical: Not on file  Tobacco Use   Smoking status: Never Smoker   Smokeless tobacco: Never Used  Substance and Sexual Activity   Alcohol use: Never    Frequency: Never   Drug use: Never   Sexual activity: Not on file  Lifestyle   Physical activity    Days per week: Not on file    Minutes per session: Not on file   Stress: Not on file  Relationships   Social connections    Talks on phone: Not on file    Gets together: Not on file    Attends religious service: Not on file    Active member of club or organization: Not on file    Attends meetings of clubs or organizations: Not on file    Relationship status: Not on file  Other Topics Concern   Not on file  Social History Narrative   Not on file     Family History: The patient's family history includes CAD in her mother; Colon cancer in her father; Heart failure in her mother; Hypertension in her mother.  ROS:   Review of Systems  Constitution: Negative for decreased appetite, fever and weight gain.    HENT: Negative for congestion, ear discharge, hoarse voice and sore throat.   Eyes: Negative for discharge, redness, vision loss in right eye and visual halos.  Cardiovascular: Negative for chest pain, dyspnea on exertion, leg swelling, orthopnea and palpitations.  Respiratory: Negative for cough, hemoptysis, shortness of breath and snoring.   Endocrine: Negative for heat intolerance and polyphagia.  Hematologic/Lymphatic: Negative for bleeding problem. Does not bruise/bleed easily.  Skin: Negative for flushing, nail changes, rash and suspicious lesions.  Musculoskeletal: Negative for arthritis, joint pain, muscle cramps, myalgias, neck pain and  stiffness.  Gastrointestinal: Negative for abdominal pain, bowel incontinence, diarrhea and excessive appetite.  Genitourinary: Negative for decreased libido, genital sores and incomplete emptying.  Neurological: Negative for brief paralysis, focal weakness, headaches and loss of balance.  Psychiatric/Behavioral: Negative for altered mental status, depression and suicidal ideas.  Allergic/Immunologic: Negative for HIV exposure and persistent infections.    EKGs/Labs/Other Studies Reviewed:    The following studies were reviewed today:   EKG:  The ekg ordered today demonstrates   CT Perfusion 9/24/22020 Nondiagnostic CT perfusion.  Cerebral Angiogram 9/24/22020 S/P RT CCA arteriogram followed by complete revascularization of RT MCA M 1 occlusion with x 1 pass with 40 mm Solitaire X retriever device achieving a TICI 3 revascularization..  Mr Brain 27 Contrast Mr Angio Head Wo Contrast Mr Angio Neck W Wo Contrast 05/20/2019  3.5 cm region of acute infarction in the right insula and temporal lobe. Few other scattered punctate infarctions in the right MCA territory.No mass effect or hemorrhage.   Restoration of flow in the right MCA territory. No new intracranial occlusion.   Atherosclerotic plaque at both carotid bulb regions but  without stenosis.   Tiny/thready right vertebral artery which could be congenital or due to acquired right vertebral disease.   The left vertebral is large vessel that appears widely patent and normal.   Ct Cerebral Perfusion W Contrast 05/19/2019 Nondiagnostic CT perfusion.   Dg Abd Portable 1v 05/20/2019 The OG tube has been advanced with the side port and distal tip both in the stomach.  05/20/2019 The oral gastric tube has been retracted slightly. The tip projects over the region of the gastric body. The side hole projects over the GE junction. 05/19/2019 Orogastric tube in proximal stomach, side-port EG junction. Consolidation at the left lung base may represent developing pneumonitis.   Dg Hip Port Unilat With Pelvis 1v Right 05/19/2019 No acute osseous injury of the right hip. Given the patient's age and osteopenia, if there is persistent clinical concern for an occult hip fracture, a CT or MRI of the hip is recommended for increased sensitivity.  DG Chest 1 View  05/28/2019 Subtle left base/retrocardiac density likely atelectasis and unchanged. Possible small amount left pleural fluid unchanged. Stable moderate cardiomegaly 05/21/19 Evidence of early edema with cardiomegaly. Likely trace pleural effusions. Interval removal of the endotracheal tube  05/19/19 Interval placement of endotracheal tube suitably position above the carina. Cardiomegaly with chronic lung changes  2D Echocardiogram 1. Left ventricular ejection fraction, by visual estimation, is <20%. The left ventricle has severely decreased function. Severely increased left ventricular size. There is no left ventricular hypertrophy. 2. Definity contrast agent was given IV to delineate the left ventricular endocardial borders. 3. Left ventricular diastolic Doppler parameters are indeterminate pattern of LV diastolic filling. 4. Global right ventricle has severely reduced systolic function.The right ventricular size  is normal. No increase in right ventricular wall thickness. 5. Left atrial size was severely dilated. 6. Right atrial size was normal. 7. . Moderate mitral valve regurgitation. 8. Tricuspid valve regurgitation moderate. 9. Pulmonic valve regurgitation is moderate by color flow Doppler. 10. Moderately elevated pulmonary artery systolic pressure.  LE Doppler   Right: There is no evidence of deep vein thrombosis in the lower extremity. However, portions of this examination were limited- see technologist comments above. No cystic structure found in the popliteal fossa. Left: There is no evidence of deep vein thrombosis in the lower extremity. However, portions of this examination were limited- see technologist comments above. No cystic  structure found in the popliteal fossa.     Recent Labs: 02/16/2019: NT-Pro BNP 7,906 05/19/2019: ALT 21; TSH 0.844 05/21/2019: B Natriuretic Peptide 2,659.4; Magnesium 1.7 05/30/2019: BUN 13; Creatinine, Ser 1.00; Hemoglobin 15.9; Platelets 262; Potassium 3.9; Sodium 139  Recent Lipid Panel    Component Value Date/Time   CHOL 96 05/20/2019 0441   TRIG 90 05/20/2019 0441   HDL 15 (L) 05/20/2019 0441   CHOLHDL 6.4 05/20/2019 0441   VLDL 18 05/20/2019 0441   LDLCALC 63 05/20/2019 0441    Physical Exam:    VS:  BP 110/80 (BP Location: Left Arm, Patient Position: Sitting, Cuff Size: Normal)    Pulse 89    Ht 5\' 8"  (1.727 m)    Wt 242 lb (109.8 kg)    SpO2 99%    BMI 36.80 kg/m     Wt Readings from Last 3 Encounters:  06/16/19 242 lb (109.8 kg)  05/21/19 248 lb 14.4 oz (112.9 kg)  05/04/19 242 lb 3.2 oz (109.9 kg)     GEN: Well nourished, well developed in no acute distress HEENT: Normal NECK: No JVD; No carotid bruits LYMPHATICS: No lymphadenopathy CARDIAC: S1S2 noted,RRR, no murmurs, rubs, gallops RESPIRATORY:  Clear to auscultation without rales, wheezing or rhonchi  ABDOMEN: Soft, non-tender, non-distended, +bowel sounds, no  guarding. EXTREMITIES: Trace leg edema, No cyanosis, no clubbing MUSCULOSKELETAL: no deformity  SKIN: Warm and dry NEUROLOGIC: Left lower extremity weakness, she has alert and oriented x 3 PSYCHIATRIC:  Normal affect, good insight  ASSESSMENT:    1. Nonischemic cardiomyopathy [facility]) cardiac catheterization May 2020 showed normal coronaries, ejection fraction 30%   2. Cerebrovascular accident (CVA) due to embolism of right middle cerebral artery (HCC)   3. History of Coumadin therapy   4. Chronic combined systolic and diastolic congestive heart failure, NYHA class 2 (HCC)   5. Middle cerebral artery embolism, right   6. Uncontrolled type 2 diabetes mellitus with hyperglycemia (HCC)   7. Mixed hyperlipidemia    PLAN:     1.  Patient appears to be recovering well from her recent CVA.  She was started on Coumadin 5 mg for INR 2-3.  We will have her establish with our Coumadin clinic here in the office.  An INR will be done today along with BMP and magnesium.  2.  Her blood pressure is acceptable no changes going to be made with her medications.  She remain on her statin 10 mg daily, carvedilol 6.25 mg daily, Lasix 40 mg daily and spironolactone 12.5 mg daily.  The patient is in agreement with the above plan. The patient left the office in stable condition.  The patient will follow up in 1 month with Dr. Bing Matter   Medication Adjustments/Labs and Tests Ordered: Current medicines are reviewed at length with the patient today.  Concerns regarding medicines are outlined above.  Orders Placed This Encounter  Procedures   Basic Metabolic Panel (BMET)   Protime-INR ( [facility] ONLY)   Magnesium   Ambulatory referral to Anticoagulation Monitoring   No orders of the defined types were placed in this encounter.   Patient Instructions  Medication Instructions:  Your physician recommends that you continue on your current medications as directed. Please refer to the Current Medication list  given to you today.  *If you need a refill on your cardiac medications before your next appointment, please call your pharmacy*  Lab Work: Your physician recommends that you return for lab work in: TODAY BMP,Magnesium,INR  If you  have labs (blood work) drawn today and your tests are completely normal, you will receive your results only by:  MyChart Message (if you have MyChart) OR  A paper copy in the mail If you have any lab test that is abnormal or we need to change your treatment, we will call you to review the results.  Testing/Procedures: None  Follow-Up: At [facility], you and your health needs are our priority.  As part of our continuing mission to provide you with exceptional heart care, we have created designated Provider Care Teams.  These Care Teams include your primary Cardiologist (physician) and Advanced Practice Providers (APPs -  Physician Assistants and Nurse Practitioners) who all work together to provide you with the care you need, when you need it.  Your next appointment:   1 month  The format for your next appointment:   In Person  Provider:   Gypsy Balsam, MD or Thomasene Ripple, DO  Other Instructions  You are being referred to the coumadin clinic here. The will contact you with a date and time for an appointment.    Vitamin K Foods and Warfarin Warfarin is a blood thinner (anticoagulant). Anticoagulant medicines help prevent the formation of blood clots. These medicines work by decreasing the activity of vitamin K, which promotes normal blood clotting. When you take warfarin, problems can occur from suddenly increasing or decreasing the amount of vitamin K that you eat from one day to the next. Problems may include:  Blood clots.  Bleeding. What general guidelines do I need to follow? To avoid problems when taking warfarin:  Eat a balanced diet that includes: ? Fresh fruits and vegetables. ? Whole grains. ? Low-fat dairy products. ? Lean  proteins, such as fish, eggs, and lean cuts of meat.  Keep your intake of vitamin K consistent from day to day. To do this: ? Avoid eating large amounts of vitamin K one day and low amounts of vitamin K the next day. ? If you take a multivitamin that contains vitamin K, be sure to take it every day. ? Know which foods contain vitamin K. Use the lists below to understand serving sizes and the amount of vitamin K in one serving.  Avoid major changes in your diet. If you are going to change your diet, talk with your health care provider before making changes.  Work with a Dealer (dietitian) to develop a meal plan that works best for you.  High vitamin K foods Foods that are high in vitamin K contain more than 100 mcg (micrograms) per serving. These include:  Broccoli (cooked) -  cup has 110 mcg.  Brussels sprouts (cooked) -  cup has 109 mcg.  Greens, beet (cooked) -  cup has 350 mcg.  Greens, collard (cooked) -  cup has 418 mcg.  Greens, turnip (cooked) -  cup has 265 mcg.  Green onions or scallions -  cup has 105 mcg.  Kale (fresh or frozen) -  cup has 531 mcg.  Parsley (raw) - 10 sprigs has 164 mcg.  Spinach (cooked) -  cup has 444 mcg.  Swiss chard (cooked) -  cup has 287 mcg. Moderate vitamin K foods Foods that have a moderate amount of vitamin K contain 25-100 mcg per serving. These include:  Asparagus (cooked) - 5 spears have 38 mcg.  Black-eyed peas (dried) -  cup has 32 mcg.  Cabbage (cooked) -  cup has 37 mcg.  Kiwi fruit - 1 medium has 31 mcg.  Lettuce - 1 cup has 57-63 mcg.  Okra (frozen) -  cup has 44 mcg.  Prunes (dried) - 5 prunes have 25 mcg.  Watercress (raw) - 1 cup has 85 mcg. Low vitamin K foods Foods low in vitamin K contain less than 25 mcg per serving. These include:  Artichoke - 1 medium has 18 mcg.  Avocado - 1 oz. has 6 mcg.  Blueberries -  cup has 14 mcg.  Cabbage (raw) -  cup has 21 mcg.  Carrots  (cooked) -  cup has 11 mcg.  Cauliflower (raw) -  cup has 11 mcg.  Cucumber with peel (raw) -  cup has 9 mcg.  Grapes -  cup has 12 mcg.  Mango - 1 medium has 9 mcg.  Nuts - 1 oz. has 15 mcg.  Pear - 1 medium has 8 mcg.  Peas (cooked) -  cup has 19 mcg.  Pickles - 1 spear has 14 mcg.  Pumpkin seeds - 1 oz. has 13 mcg.  Sauerkraut (canned) -  cup has 16 mcg.  Soybeans (cooked) -  cup has 16 mcg.  Tomato (raw) - 1 medium has 10 mcg.  Tomato sauce -  cup has 17 mcg. Vitamin K-free foods If a food contain less than 5 mcg per serving, it is considered to have no vitamin K. These foods include:  Bread and cereal products.  Cheese.  Eggs.  Fish and shellfish.  Meat and poultry.  Milk and dairy products.  Sunflower seeds. Actual amounts of vitamin K in foods may be different depending on processing. Talk with your dietitian about what foods you can eat and what foods you should avoid. This information is not intended to replace advice given to you by your health care provider. Make sure you discuss any questions you have with your health care provider. Document Released: 06/08/2009 Document Revised: 07/24/2017 Document Reviewed: 11/14/2015 Elsevier Patient Education  2020 [employer] Warfarin tablets What is this medicine? WARFARIN (WAR far in) is an anticoagulant. It is used to treat or prevent clots in the veins, arteries, lungs, or heart. This medicine may be used for other purposes; ask your health care provider or pharmacist if you have questions. COMMON BRAND NAME(S): Coumadin, Jantoven What should I tell my health care provider before I take this medicine? They need to know if you have any of these conditions:  alcoholism  anemia  bleeding disorders  cancer  diabetes  heart disease  high blood pressure  history of bleeding in the gastrointestinal tract  history of stroke or other brain injury or disease  kidney or liver  disease  protein C deficiency  protein S deficiency  psychosis or dementia  recent injury, recent or planned surgery or procedure  an unusual or allergic reaction to warfarin, other medicines, foods, dyes, or preservatives  pregnant or trying to get pregnant  breast-feeding How should I use this medicine? Take this medicine by mouth with a glass of water. Follow the directions on the prescription label. You can take this medicine with or without food. Take your medicine at the same time each day. Do not take it more often than directed. Do not stop taking except on your doctor's advice. Stopping this medicine may increase your risk of a blood clot. Be sure to refill your prescription before you run out of medicine. If your doctor or healthcare professional calls to change your dose, write down the dose and any other instructions. Always read the dose and instructions  back to him or her to make sure you understand them. Tell your doctor or healthcare professional what strength of tablets you have on hand. Ask how many tablets you should take to equal your new dose. Write the date on the new instructions and keep them near your medicine. If you are told to stop taking your medicine until your next blood test, call your doctor or healthcare professional if you do not hear anything within 24 hours of the test to find out your new dose or when to restart your prior dose. A special MedGuide will be given to you by the pharmacist with each prescription and refill. Be sure to read this information carefully each time. Talk to your pediatrician regarding the use of this medicine in children. Special care may be needed. Overdosage: If you think you have taken too much of this medicine contact a poison control center or emergency room at once. NOTE: This medicine is only for you. Do not share this medicine with others. What if I miss a dose? It is important not to miss a dose. If you miss a dose, call  your healthcare provider. Take the dose as soon as possible on the same day. If it is almost time for your next dose, take only that dose. Do not take double or extra doses to make up for a missed dose. What may interact with this medicine? Do not take this medicine with any of the following medications:  agents that prevent or dissolve blood clots  aspirin or other salicylates  danshen  dextrothyroxine  mifepristone  St. John's Wort  red yeast rice This medicine may also interact with the following medications:  acetaminophen  agents that lower cholesterol  alcohol  allopurinol  amiodarone  antibiotics or medicines for treating bacterial, fungal or viral infections  azathioprine  barbiturate medicines for inducing sleep or treating seizures  certain medicines for diabetes  certain medicines for heart rhythm problems  certain medicines for hepatitis C virus infections like daclatasvir, dasabuvir; ombitasvir; paritaprevir; ritonavir, elbasvir; grazoprevir, ledipasvir; sofosbuvir, simeprevir, sofosbuvir, sofosbuvir; velpatasvir, sofosbuvir; velpatasvir; voxilaprevir  certain medicines for high blood pressure  chloral hydrate  cisapride  conivaptan  disulfiram  female hormones, including contraceptive or birth control pills  general anesthetics  herbal or dietary products like garlic, ginkgo, ginseng, green tea, or kava kava  influenza virus vaccine  female hormones  medicines for mental depression or psychosis  medicines for some types of cancer  medicines for stomach problems  methylphenidate  NSAIDs, medicines for pain and inflammation, like ibuprofen or naproxen  propoxyphene  quinidine, quinine  raloxifene  seizure or epilepsy medicine like carbamazepine, phenytoin, and valproic acid  steroids like cortisone and prednisone  tamoxifen  thyroid medicine  tramadol  vitamin c, vitamin e, and vitamin K  zafirlukast  zileuton This  list may not describe all possible interactions. Give your health care provider a list of all the medicines, herbs, non-prescription drugs, or dietary supplements you use. Also tell them if you smoke, drink alcohol, or use illegal drugs. Some items may interact with your medicine. What should I watch for while using this medicine? Visit your healthcare professional for regular checks on your progress. You will need to have a blood test called a PT/INR regularly. The PT/INR blood test is done to make sure you are getting the right dose of this medicine. It is important to not miss your appointment for the blood tests. When you first start taking this medicine, these  tests are done often. Once the correct dose is determined and you take your medicine properly, these tests can be done less often. Wear a medical ID bracelet or chain, and carry a card that describes your disease and details of your medicine and dosage times. Do not start taking or stop taking any medicines or over-the-counter medicines except on the advice of your healthcare professional. You should discuss your diet with your healthcare professional. Do not make major changes in your diet. Vitamin K can affect how well this medicine works. Many foods contain vitamin K. It is important to eat a consistent amount of foods with vitamin K. Other foods with vitamin K that you should eat in consistent amounts are asparagus, basil, black-eyed peas, broccoli, brussel sprouts, cabbage, green onions, green tea, parsley, green leafy vegetables like beet greens, collard greens, kale, spinach, turnip greens, or certain lettuces like green leaf or romaine. This medicine can cause birth defects or bleeding in an unborn child. Women of childbearing age should use effective birth control while taking this medicine. If a woman becomes pregnant while taking this medicine, she should discuss the potential risks and her options with her healthcare professional. Avoid  sports and activities that might cause injury while you are using this medicine. Severe falls or injuries can cause unseen bleeding. Be careful when using sharp tools or knives. Consider using an Neurosurgeonelectric razor. Take special care brushing or flossing your teeth. Report any injuries, bruising, or red spots on the skin to your healthcare professional. If you have an illness that causes vomiting, diarrhea, or fever for more than a few days, contact your health care professional. Also, check with your healthcare professional if you are unable to eat for several days. These problems can change the effect of this medicine. Even after you stop taking this medicine, it takes several days before your body recovers its normal ability to clot blood. Ask your healthcare professional how long you need to be careful. If you are going to have surgery or dental work, tell your health care professional that you have been taking this medicine. What side effects may I notice from receiving this medicine? Side effects that you should report to your doctor or health care professional as soon as possible:  allergic reactions like skin rash, itching or hives, swelling of the face, lips, or tongue  heavy menstrual bleeding or vaginal bleeding  painful, blue or purple toes  painful skin ulcers that do not go away  signs and symptoms of bleeding such as bloody or black, tarry stools; red or dark-brown urine; spitting up blood or brown material that looks like coffee grounds; red spots on the skin; unusual bruising or bleeding from the eye, gums, or nose  signs and symptoms of a blood clot such as chest pain; shortness of breath; pain, swelling, or warmth in the leg  signs and symptoms of a stroke such as changes in vision; confusion; trouble speaking or understanding; severe headaches; sudden numbness or weakness of the face, arm or leg; trouble walking; dizziness; loss of coordination  stomach pain  unusually weak or  tired Side effects that usually do not require medical attention (report to your doctor or health care professional if they continue or are bothersome):  diarrhea  hair loss This list may not describe all possible side effects. Call your doctor for medical advice about side effects. You may report side effects to FDA at 1-800-FDA-1088. Where should I keep my medicine? Keep out of the  reach of children. Store at room temperature between 15 and 30 degrees C (59 and 86 degrees F). Protect from light. Throw away any unused medicine after the expiration date. Do not flush down the toilet. NOTE: This sheet is a summary. It may not cover all possible information. If you have questions about this medicine, talk to your doctor, pharmacist, or health care provider.  2020 Elsevier/Gold Standard (2017-05-27 13:06:45)      Adopting a Healthy Lifestyle.  Know what a healthy weight is for you (roughly BMI <25) and aim to maintain this   Aim for 7+ servings of fruits and vegetables daily   65-80+ fluid ounces of water or unsweet tea for healthy kidneys   Limit to max 1 drink of alcohol per day; avoid smoking/tobacco   Limit animal fats in diet for cholesterol and heart health - choose grass fed whenever available   Avoid highly processed foods, and foods high in saturated/trans fats   Aim for low stress - take time to unwind and care for your mental health   Aim for 150 min of moderate intensity exercise weekly for heart health, and weights twice weekly for bone health   Aim for 7-9 hours of sleep daily   When it comes to diets, agreement about the perfect plan isnt easy to find, even among the experts. Experts at the [facility] of [employer] developed an idea known as the Healthy Eating Plate. Just imagine a plate divided into logical, healthy portions.   The emphasis is on diet quality:   Load up on vegetables and fruits - one-half of your plate: Aim for color and variety, and  remember that potatoes dont count.   Go for whole grains - one-quarter of your plate: Whole wheat, barley, wheat berries, quinoa, oats, brown rice, and foods made with them. If you want pasta, go with whole wheat pasta.   Protein power - one-quarter of your plate: Fish, chicken, beans, and nuts are all healthy, versatile protein sources. Limit red meat.   The diet, however, does go beyond the plate, offering a few other suggestions.   Use healthy plant oils, such as olive, canola, soy, corn, sunflower and peanut. Check the labels, and avoid partially hydrogenated oil, which have unhealthy trans fats.   If youre thirsty, drink water. Coffee and tea are good in moderation, but skip sugary drinks and limit milk and dairy products to one or two daily servings.   The type of carbohydrate in the diet is more important than the amount. Some sources of carbohydrates, such as vegetables, fruits, whole grains, and beans-are healthier than others.   Finally, stay active  Signed, Thomasene Ripple, DO  06/16/2019 3:42 PM    [facility]

## 2019-06-16 NOTE — Telephone Encounter (Signed)
Called patient to set up initial INR appointment. Unfortunately we do not have an Therapist, sports or PharmD in [place] at this time. Therefore she will need to come to [place] for her first appointment. Called patient, husband answered. She is still at Dr. Gabriel Carina. Will call back later

## 2019-06-17 LAB — PROTIME-INR
INR: 2.2 — ABNORMAL HIGH (ref 0.9–1.2)
Prothrombin Time: 23.1 s — ABNORMAL HIGH (ref 9.1–12.0)

## 2019-06-17 LAB — BASIC METABOLIC PANEL
BUN/Creatinine Ratio: 17 (ref 12–28)
BUN: 18 mg/dL (ref 8–27)
CO2: 25 mmol/L (ref 20–29)
Calcium: 9.3 mg/dL (ref 8.7–10.3)
Chloride: 100 mmol/L (ref 96–106)
Creatinine, Ser: 1.08 mg/dL — ABNORMAL HIGH (ref 0.57–1.00)
GFR calc Af Amer: 63 mL/min/{1.73_m2} (ref >59)
GFR calc non Af Amer: 55 mL/min/{1.73_m2} — ABNORMAL LOW (ref >59)
Glucose: 146 mg/dL — ABNORMAL HIGH (ref 65–99)
Potassium: 4.7 mmol/L (ref 3.5–5.2)
Sodium: 141 mmol/L (ref 134–144)

## 2019-06-17 LAB — MAGNESIUM: Magnesium: 2 mg/dL (ref 1.6–2.3)

## 2019-06-17 NOTE — Telephone Encounter (Signed)
Spoke with patient daughter. I have scheduled her a new patient appointment a the [facility] office next Thursday. INR from yesterday therapeutic at 2.2. Per daughter patient did not get a dose of warfarin on 10/21 as she thought they told her to wait until she was checked in the office, but did give a dose last night. Discharge instructions only said warfarin 5mg  a directed. I was able to get in contact with the pharmacist at [facility] and find out what doses she had been receiving and her INR. I have instructed patient daughter to give her 1/2 tablet (5mg ) daily except 1 tablet (5mg ) on Tuesdays and Thursdays. Will recheck INR on Thursday at [place] office @ 2:30.   Date     Warfarin dose  INR 10/14    1mg                  2.46 10/15     2.5                  1.92 10/16     2.5                  1.85 10/17     3mg                 1.63 10/18     3 mg               1.66 10/19     3 mg               1.77 10/20     5mg                 1.71 1/21       0 mg??           1.94 10/22     5mg                 2.2

## 2019-06-23 ENCOUNTER — Other Ambulatory Visit: Payer: Self-pay

## 2019-06-23 ENCOUNTER — Ambulatory Visit (INDEPENDENT_AMBULATORY_CARE_PROVIDER_SITE_OTHER): Payer: BLUE CROSS/BLUE SHIELD | Admitting: Pharmacist Clinician (PhC)/ Clinical Pharmacy Specialist

## 2019-06-23 DIAGNOSIS — Z9229 Personal history of other drug therapy: Secondary | ICD-10-CM | POA: Diagnosis not present

## 2019-06-23 DIAGNOSIS — I63411 Cerebral infarction due to embolism of right middle cerebral artery: Secondary | ICD-10-CM | POA: Diagnosis not present

## 2019-06-23 LAB — POCT INR: INR: 4.3 — AB (ref 2.0–3.0)

## 2019-06-24 DIAGNOSIS — Z09 Encounter for follow-up examination after completed treatment for conditions other than malignant neoplasm: Secondary | ICD-10-CM

## 2019-06-24 HISTORY — DX: Encounter for follow-up examination after completed treatment for conditions other than malignant neoplasm: Z09

## 2019-06-28 ENCOUNTER — Other Ambulatory Visit: Payer: Self-pay

## 2019-06-28 ENCOUNTER — Ambulatory Visit (INDEPENDENT_AMBULATORY_CARE_PROVIDER_SITE_OTHER): Payer: BLUE CROSS/BLUE SHIELD | Admitting: *Deleted

## 2019-06-28 DIAGNOSIS — Z9229 Personal history of other drug therapy: Secondary | ICD-10-CM

## 2019-06-28 DIAGNOSIS — I63411 Cerebral infarction due to embolism of right middle cerebral artery: Secondary | ICD-10-CM | POA: Diagnosis not present

## 2019-06-28 LAB — POCT INR: INR: 3.2 — AB (ref 2.0–3.0)

## 2019-06-28 NOTE — Patient Instructions (Signed)
Decrease warfarin to 1/2 tablet daily except none on Tuesdays May need to decrease warfarin to 2.5mg  tablet Recheck in 1 week

## 2019-07-05 ENCOUNTER — Other Ambulatory Visit: Payer: Self-pay

## 2019-07-05 ENCOUNTER — Other Ambulatory Visit (INDEPENDENT_AMBULATORY_CARE_PROVIDER_SITE_OTHER): Payer: BLUE CROSS/BLUE SHIELD

## 2019-07-05 ENCOUNTER — Ambulatory Visit (INDEPENDENT_AMBULATORY_CARE_PROVIDER_SITE_OTHER): Payer: BLUE CROSS/BLUE SHIELD | Admitting: Pharmacist

## 2019-07-05 DIAGNOSIS — Z9229 Personal history of other drug therapy: Secondary | ICD-10-CM

## 2019-07-05 DIAGNOSIS — I63411 Cerebral infarction due to embolism of right middle cerebral artery: Secondary | ICD-10-CM | POA: Diagnosis not present

## 2019-07-05 LAB — POCT INR: INR: 2.7 (ref 2.0–3.0)

## 2019-07-12 ENCOUNTER — Ambulatory Visit (INDEPENDENT_AMBULATORY_CARE_PROVIDER_SITE_OTHER): Payer: BLUE CROSS/BLUE SHIELD | Admitting: *Deleted

## 2019-07-12 ENCOUNTER — Telehealth: Payer: Self-pay | Admitting: Cardiology

## 2019-07-12 ENCOUNTER — Other Ambulatory Visit: Payer: Self-pay

## 2019-07-12 ENCOUNTER — Other Ambulatory Visit: Payer: Self-pay | Admitting: *Deleted

## 2019-07-12 DIAGNOSIS — I63411 Cerebral infarction due to embolism of right middle cerebral artery: Secondary | ICD-10-CM | POA: Diagnosis not present

## 2019-07-12 DIAGNOSIS — Z9229 Personal history of other drug therapy: Secondary | ICD-10-CM | POA: Diagnosis not present

## 2019-07-12 LAB — POCT INR: INR: 2.5 (ref 2.0–3.0)

## 2019-07-12 MED ORDER — SPIRONOLACTONE 25 MG PO TABS: 12.5000 mg | ORAL_TABLET | Freq: Every day | ORAL | 1 refills | Status: DC

## 2019-07-12 MED ORDER — POTASSIUM CHLORIDE ER 10 MEQ PO TBCR: 10.0000 meq | EXTENDED_RELEASE_TABLET | Freq: Every day | ORAL | 1 refills | Status: DC

## 2019-07-12 NOTE — Patient Instructions (Signed)
Continue taking warfarin 1/2 tablet daily except none on Tuesdays Recheck in 2 weeks

## 2019-07-12 NOTE — Telephone Encounter (Signed)
°*  STAT* If patient is at the pharmacy, call can be transferred to refill team.   1. Which medications need to be refilled? (please list name of each medication and dose if known) Spironolactone 25mg , and Potassium chloride 72meq  2. Which pharmacy/location (including street and city if local pharmacy) is medication to be sent to?[facility] [address] [place]  3. Do they need a 30 day or 90 day supply? [place]

## 2019-07-13 ENCOUNTER — Other Ambulatory Visit: Payer: Self-pay | Admitting: Cardiology

## 2019-07-13 NOTE — Telephone Encounter (Signed)
Refill sent.

## 2019-07-14 ENCOUNTER — Encounter: Payer: Self-pay | Admitting: Adult Health

## 2019-07-14 ENCOUNTER — Telehealth: Payer: Self-pay | Admitting: *Deleted

## 2019-07-14 ENCOUNTER — Inpatient Hospital Stay: Payer: Self-pay | Admitting: Adult Health

## 2019-07-14 NOTE — Progress Notes (Deleted)
[facility] [address] [place]. [place] [id number] [phone]       HOSPITAL FOLLOW UP NOTE  Ms. Candace CruiseBeverly Roepke Date of Birth:  1956/03/30 Medical Record Number:  [id number]   Reason for Referral:  hospital stroke follow up    CHIEF COMPLAINT:  No chief complaint on file.   HPI: [place] ServiceBeverly Cassidyis being seen today for in office hospital follow-up regarding right MCA infarct due to right M1 occlusion status post IR with TICI 3 reperfusion secondary to severe cardiomyopathy with low EF on 05/19/2019.  History obtained from *** and chart review. Reviewed all radiology images and labs personally.  Meriam SpragueBeverly Cassidyis an 63 y.o.femalepresented in transfer from the [facility] ED with acute onset of left hemiplegia, left hemisensory loss and left facial droop. LKW was 9 PM last night 05/18/2019 prior to going to sleep. Symptoms were present on awakening. She was evaluated by Teleneurology at [facility] and was not a candidate for tPA based on presentation outside the window. CTA of head and neck revealed a right MCA occlusion. 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 NIHSS was 8 at [facility]. [facility]  for further evaluation work-up.  Transferred to IR for cerebral angiogram for right M1 occlusion with TICI 3 revascularization.  Follow-up IR CT unremarkable.  MRI showed a 3.5 cm region of acute infarction in the right insula and temporal lobe along with few other scattered punctate infarctions in the right MCA territory.  MRA head/neck showed restoration of flow to the right MCA territory without new intracranial occlusion and arthrosclerotic plaque bilateral ICA evolving tiny/thready right VA.  2D echo showed an EF of less than 20%.  LE venous Doppler negative.  LDL 63.  A1c 7.2.  Previously on aspirin  and recommended initiation of warfarin due to low EF likely etiology of recent stroke with INR goal 2-3.  Recommended ongoing follow-up with cardiology outpatient.  HTN stable.  Resumed atorvastatin 10 mg daily.  Uncontrolled DM and recommend close PCP follow-up.  Other stroke risk factors include obesity and prior stroke on imaging.  Therapies recommended inpatient rehab and was transferred to [facility] for ongoing PT/OT/ST.   ROS:   14 system review of systems performed and negative with exception of ***  PMH:  Past Medical History:  Diagnosis Date   Chronic systolic CHF (congestive heart failure) (HCC)    Elevated hemoglobin (HCC)    GERD (gastroesophageal reflux disease)    Hyperlipidemia    Hypertension    LBBB (left bundle branch block)    Mild renal insufficiency    Mitral regurgitation    NICM (nonischemic cardiomyopathy) (HCC)    Pre-diabetes    Tricuspid regurgitation     PSH:  Past Surgical History:  Procedure Laterality Date   IR ANGIO VERTEBRAL SEL SUBCLAVIAN INNOMINATE UNI R MOD SED  05/19/2019   IR CT HEAD LTD  05/19/2019   IR PERCUTANEOUS ART THROMBECTOMY/INFUSION INTRACRANIAL INC DIAG ANGIO  05/19/2019   RADIOLOGY WITH ANESTHESIA N/A 05/19/2019   Procedure: IR WITH ANESTHESIA;  Surgeon: Julieanne Cottoneveshwar, Sanjeev, MD;  Location: [facility] OR;  Service: Radiology;  Laterality: N/A;   RIGHT/LEFT HEART CATH AND CORONARY ANGIOGRAPHY N/A 01/13/2019   Procedure: RIGHT/LEFT HEART CATH AND CORONARY ANGIOGRAPHY;  Surgeon: Runell GessBerry, Jonathan J, MD;  Location: [facility] INVASIVE CV LAB;  Service: Cardiovascular;  Laterality: N/A;  TUBAL LIGATION      Social History:  Social History   Socioeconomic History   Marital status: Unknown    Spouse name: Not on file   Number of children: Not on file   Years of education: Not on file   Highest education level: Not on file  Occupational History   Not on file  Social Needs   Financial resource strain: Not on file    Food insecurity    Worry: Not on file    Inability: Not on file   Transportation needs    Medical: Not on file    Non-medical: Not on file  Tobacco Use   Smoking status: Never Smoker   Smokeless tobacco: Never Used  Substance and Sexual Activity   Alcohol use: Never    Frequency: Never   Drug use: Never   Sexual activity: Not on file  Lifestyle   Physical activity    Days per week: Not on file    Minutes per session: Not on file   Stress: Not on file  Relationships   Social connections    Talks on phone: Not on file    Gets together: Not on file    Attends religious service: Not on file    Active member of club or organization: Not on file    Attends meetings of clubs or organizations: Not on file    Relationship status: Not on file   Intimate partner violence    Fear of current or ex partner: Not on file    Emotionally abused: Not on file    Physically abused: Not on file    Forced sexual activity: Not on file  Other Topics Concern   Not on file  Social History Narrative   Not on file    Family History:  Family History  Problem Relation Age of Onset   CAD Mother    Heart failure Mother    Hypertension Mother    Colon cancer Father     Medications:   Current Outpatient Medications on File Prior to Visit  Medication Sig Dispense Refill   albuterol (VENTOLIN HFA) 108 (90 Base) MCG/ACT inhaler Inhale 2 puffs into the lungs every 4 (four) hours as needed for shortness of breath.     atorvastatin (LIPITOR) 10 MG tablet Take 10 mg by mouth daily.     carvedilol (COREG) 6.25 MG tablet TAKE 1 TABLET(6.25 MG) BY MOUTH TWICE DAILY WITH A MEAL (Patient taking differently: Take 6.25 mg by mouth 2 (two) times daily with a meal. ) 60 tablet 1   famotidine (PEPCID) 40 MG tablet Take 40 mg by mouth at bedtime.      fluticasone (FLONASE) 50 MCG/ACT nasal spray Place 2 sprays into both nostrils daily as needed for allergies or rhinitis.      furosemide  (LASIX) 40 MG tablet Take 1 tablet (40 mg total) by mouth daily. (Patient taking differently: Take 20 mg by mouth daily. ) 60 tablet 1   loratadine (CLARITIN) 10 MG tablet Take 10 mg by mouth daily as needed for allergies or rhinitis.      montelukast (SINGULAIR) 10 MG tablet Take 10 mg by mouth at bedtime.     omeprazole (PRILOSEC) 20 MG capsule Take 20 mg by mouth daily.     potassium chloride (KLOR-CON) 10 MEQ tablet Take 1 tablet (10 mEq total) by mouth daily. 90 tablet 1   spironolactone (ALDACTONE) 25 MG tablet Take 0.5 tablets (12.5 mg total) by mouth daily.  45 tablet 1   Vitamin D, Ergocalciferol, (DRISDOL) 1.25 MG (50000 UT) CAPS capsule Take 50,000 Units by mouth every Monday.      warfarin (COUMADIN) 5 MG tablet Take 5 mg by mouth daily.     No current facility-administered medications on file prior to visit.     Allergies:   Allergies  Allergen Reactions   Lisinopril Cough   Losartan Cough     Physical Exam  There were no vitals filed for this visit. There is no height or weight on file to calculate BMI. No exam data present  No flowsheet data found.   General: well developed, well nourished, seated, in no evident distress Head: head normocephalic and atraumatic.   Neck: supple with no carotid or supraclavicular bruits Cardiovascular: regular rate and rhythm, no murmurs Musculoskeletal: no deformity Skin:  no rash/petichiae Vascular:  Normal pulses all extremities   Neurologic Exam Mental Status: Awake and fully alert. Oriented to place and time. Recent and remote memory intact. Attention span, concentration and fund of knowledge appropriate. Mood and affect appropriate.  Cranial Nerves: Fundoscopic exam reveals sharp disc margins. Pupils equal, briskly reactive to light. Extraocular movements full without nystagmus. Visual fields full to confrontation. Hearing intact. Facial sensation intact. Face, tongue, palate moves normally and symmetrically.  Motor:  Normal bulk and tone. Normal strength in all tested extremity muscles. Sensory.: intact to touch , pinprick , position and vibratory sensation.  Coordination: Rapid alternating movements normal in all extremities. Finger-to-nose and heel-to-shin performed accurately bilaterally. Gait and Station: Arises from chair without difficulty. Stance is normal. Gait demonstrates normal stride length and balance Reflexes: 1+ and symmetric. Toes downgoing.     NIHSS  *** Modified Rankin  *** CHA2DS2-VASc *** HAS-BLED ***   Diagnostic Data (Labs, Imaging, Testing)  CT Perfusion 9/24/22020 Nondiagnostic CT perfusion.  Cerebral Angiogram 9/24/22020 S/P RT CCA arteriogram followed by complete revascularization of RT MCA M 1 occlusion with x 1 pass with 47mmx 40 mm Solitaire X retriever device achieving a TICI 3 revascularization..  Mr Brain 4 Contrast Mr Angio Head Wo Contrast Mr Angio Neck W Wo Contrast 05/20/2019  3.5 cm region of acute infarction in the right insula and temporal lobe. Few other scattered punctate infarctions in the right MCA territory.No mass effect or hemorrhage.   Restoration of flow in the right MCA territory. No new intracranial occlusion.   Atherosclerotic plaque at both carotid bulb regions but without stenosis.   Tiny/thready right vertebral artery which could be congenital or due to acquired right vertebral disease.   The left vertebral is large vessel that appears widely patent and normal.   Ct Cerebral Perfusion W Contrast 05/19/2019 Nondiagnostic CT perfusion.   Dg Abd Portable 1v 05/20/2019 The OG tube has been advanced with the side port and distal tip both in the stomach.  05/20/2019 The oral gastric tube has been retracted slightly. The tip projects over the region of the gastric body. The side hole projects over the GE junction. 05/19/2019 Orogastric tube in proximal stomach, side-port EG junction. Consolidation at the left lung base may represent  developing pneumonitis.   Dg Hip Port Unilat With Pelvis 1v Right 05/19/2019 No acute osseous injury of the right hip. Given the patient's age and osteopenia, if there is persistent clinical concern for an occult hip fracture, a CT or MRI of the hip is recommended for increased sensitivity.  DG Chest 1 View  05/28/2019 Subtle left base/retrocardiac density likely atelectasis and unchanged. Possible small amount  left pleural fluid unchanged. Stable moderate cardiomegaly 05/21/19 Evidence of early edema with cardiomegaly. Likely trace pleural effusions. Interval removal of the endotracheal tube  05/19/19 Interval placement of endotracheal tube suitably position above the carina. Cardiomegaly with chronic lung changes  2D Echocardiogram 1. Left ventricular ejection fraction, by visual estimation, is <20%. The left ventricle has severely decreased function. Severely increased left ventricular size. There is no left ventricular hypertrophy. 2. Definity contrast agent was given IV to delineate the left ventricular endocardial borders. 3. Left ventricular diastolic Doppler parameters are indeterminate pattern of LV diastolic filling. 4. Global right ventricle has severely reduced systolic function.The right ventricular size is normal. No increase in right ventricular wall thickness. 5. Left atrial size was severely dilated. 6. Right atrial size was normal. 7. . Moderate mitral valve regurgitation. 8. Tricuspid valve regurgitation moderate. 9. Pulmonic valve regurgitation is moderate by color flow Doppler. 10. Moderately elevated pulmonary artery systolic pressure.  LE Doppler   Right: There is no evidence of deep vein thrombosis in the lower extremity. However, portions of this examination were limited- see technologist comments above. No cystic structure found in the popliteal fossa. Left: There is no evidence of deep vein thrombosis in the lower extremity. However, portions of this  examination were limited- see technologist comments above. No cystic structure found in the popliteal fossa.   ASSESSMENT: Breanna Bennett is a 63 y.o. year old female presented with left-sided weakness, left hemisensory deficit, speech difficulty and left facial droop on 05/19/2019 with stroke work-up revealing right MCA infarct due to right M1 occlusion status post IR with TICI 3 reperfusion secondary to severe cardiomyopathy with low EF. Vascular risk factors include HTN, HLD, DM, CHF, morbid obesity, LBBB and prior stroke on imaging.     PLAN:  1. Left MCA stroke: Continue warfarin daily  and ***  for secondary stroke prevention. Maintain strict control of hypertension with blood pressure goal below 130/90, diabetes with hemoglobin A1c goal below 6.5% and cholesterol with LDL cholesterol (bad cholesterol) goal below 70 mg/dL.  I also advised the patient to eat a healthy diet with plenty of whole grains, cereals, fruits and vegetables, exercise regularly with at least 30 minutes of continuous activity daily and maintain ideal body weight. 2. Cardiomyopathy with low EF: Continuation of warfarin with INR goal 2-3 and ongoing follow-up with cardiology 3. HTN: Advised to continue current treatment regimen.  Today's BP ***.  Advised to continue to monitor at home along with continued follow-up with PCP for management 4. HLD: Advised to continue current treatment regimen along with continued follow-up with PCP for future prescribing and monitoring of lipid panel 5. DMII: Advised to continue to monitor glucose levels at home along with continued follow-up with PCP for management and monitoring    Follow up in *** or call earlier if needed   Greater than 50% of time during this 45 minute visit was spent on counseling, explanation of diagnosis of ***, reviewing risk factor management of ***, planning of further management along with potential future management, and discussion with patient and family  answering all questions.    Ihor Austin, AGNP-BC  [facility] [address] [place], [place] [address]  Phone [phone] Fax [phone] Note: This document was prepared with digital dictation and possible smart phrase technology. Any transcriptional errors that result from this process are unintentional.

## 2019-07-14 NOTE — Telephone Encounter (Signed)
Patient was no show for follow up with NP today.  

## 2019-07-18 ENCOUNTER — Other Ambulatory Visit: Payer: Self-pay

## 2019-07-18 ENCOUNTER — Encounter: Payer: Self-pay | Admitting: Cardiology

## 2019-07-18 ENCOUNTER — Ambulatory Visit (INDEPENDENT_AMBULATORY_CARE_PROVIDER_SITE_OTHER): Payer: BLUE CROSS/BLUE SHIELD | Admitting: Cardiology

## 2019-07-18 VITALS — BP 118/90 | HR 93 | Ht 68.0 in | Wt 233.0 lb

## 2019-07-18 DIAGNOSIS — I428 Other cardiomyopathies: Secondary | ICD-10-CM | POA: Diagnosis not present

## 2019-07-18 DIAGNOSIS — I63411 Cerebral infarction due to embolism of right middle cerebral artery: Secondary | ICD-10-CM | POA: Diagnosis not present

## 2019-07-18 DIAGNOSIS — E1165 Type 2 diabetes mellitus with hyperglycemia: Secondary | ICD-10-CM | POA: Diagnosis not present

## 2019-07-18 DIAGNOSIS — I10 Essential (primary) hypertension: Secondary | ICD-10-CM

## 2019-07-18 NOTE — Patient Instructions (Signed)

## 2019-07-18 NOTE — Progress Notes (Signed)
Cardiology Office Note:    Date:  07/18/2019   ID:  Breanna Bennett, DOB 09-09-1955, MRN [id number]  PCP:  Marisue Humble, FNP  Cardiologist:  Jenne Campus, MD  Electrophysiologist:  None   Referring MD: Denzil Magnuson *   Chief Complaint  Patient presents with  . Follow-up    History of Present Illness:    Breanna Bennett is a 63 y.o. female with a hx of recent CVA (right MCA infarct from embolization) status post mechanical thrombectomy, chronic systolic heart failure, obesity, hyperlipidemia, type 2 diabetes presents for visit.  She usually follows with Dr. Agustin Cree.  I did see the patient June 16, 2019 during our visit she had just returned from rehab after a prolonged hospitalization.  She presented today after she was discharged home from rehab.    At that visit I was told by her daughter that  she was home right after taking a shower she had an acute fall.  Once the family went to look at the patient states the recognize immediately the right facial droop call 911.  She was brought to [facility] and she was evaluated by teleneurology at that time she was no longer a TPA candidate.  CT of the head and neck did reveal MCA occlusion.  She was then transferred to [facility] for CT perfusion study and possible retrieval of her clot by IR.  Per record she did have a successful mechanical lobectomy. Post hospitalization she was at a rehab.  During her visit on June 16, 2019 given the fact that the patient was on Coumadin we establish care with our Coumadin clinic.  She tells me she has been following with the Coumadin clinic.  Patient is here today for follow-up visit.  Past Medical History:  Diagnosis Date  . Chronic systolic CHF (congestive heart failure) ([place])   . Elevated hemoglobin ([place])   . GERD (gastroesophageal reflux disease)   . Hyperlipidemia   . Hypertension   . LBBB (left bundle branch block)   . Mild renal insufficiency    . Mitral regurgitation   . NICM (nonischemic cardiomyopathy) ([place])   . Pre-diabetes   . Tricuspid regurgitation     Past Surgical History:  Procedure Laterality Date  . IR ANGIO VERTEBRAL SEL SUBCLAVIAN INNOMINATE UNI R MOD SED  05/19/2019  . IR CT HEAD LTD  05/19/2019  . IR PERCUTANEOUS ART THROMBECTOMY/INFUSION INTRACRANIAL INC DIAG ANGIO  05/19/2019  . RADIOLOGY WITH ANESTHESIA N/A 05/19/2019   Procedure: IR WITH ANESTHESIA;  Surgeon: Luanne Bras, MD;  Location: [place];  Service: Radiology;  Laterality: N/A;  . RIGHT/LEFT HEART CATH AND CORONARY ANGIOGRAPHY N/A 01/13/2019   Procedure: RIGHT/LEFT HEART CATH AND CORONARY ANGIOGRAPHY;  Surgeon: Lorretta Harp, MD;  Location: [place] [facility];  Service: Cardiovascular;  Laterality: N/A;  . TUBAL LIGATION      Current Medications: Current Meds  Medication Sig  . albuterol (VENTOLIN HFA) 108 (90 Base) MCG/ACT inhaler Inhale 2 puffs into the lungs every 4 (four) hours as needed for shortness of breath.  Marland Kitchen atorvastatin (LIPITOR) 10 MG tablet Take 10 mg by mouth daily.  . carvedilol (COREG) 6.25 MG tablet TAKE 1 TABLET(6.25 MG) BY MOUTH TWICE DAILY WITH A MEAL (Patient taking differently: Take 6.25 mg by mouth 2 (two) times daily with a meal. )  . famotidine (PEPCID) 40 MG tablet Take 40 mg by mouth at bedtime.   . fluticasone (FLONASE) 50 MCG/ACT nasal spray Place 2 sprays into  both nostrils daily as needed for allergies or rhinitis.   . furosemide (LASIX) 40 MG tablet Take 1 tablet (40 mg total) by mouth daily. (Patient taking differently: Take 20 mg by mouth daily. )  . glipiZIDE (GLUCOTROL XL) 2.5 MG 24 hr tablet Take 2.5 mg by mouth daily.  Marland Kitchen loratadine (CLARITIN) 10 MG tablet Take 10 mg by mouth daily as needed for allergies or rhinitis.   . magnesium oxide (MAG-OX) 400 MG tablet Take 400 mg by mouth 3 (three) times daily.  . montelukast (SINGULAIR) 10 MG tablet Take 10 mg by mouth at bedtime.  Marland Kitchen omeprazole (PRILOSEC) 20 MG  capsule Take 20 mg by mouth daily.  . potassium chloride (KLOR-CON) 10 MEQ tablet Take 1 tablet (10 mEq total) by mouth daily.  Marland Kitchen spironolactone (ALDACTONE) 25 MG tablet Take 0.5 tablets (12.5 mg total) by mouth daily.  . Vitamin D, Ergocalciferol, (DRISDOL) 1.25 MG (50000 UT) CAPS capsule Take 50,000 Units by mouth every Monday.   . warfarin (COUMADIN) 5 MG tablet Take 5 mg by mouth daily.     Allergies:   Lisinopril and Losartan   Social History   Socioeconomic History  . Marital status: Unknown    Spouse name: Not on file  . Number of children: Not on file  . Years of education: Not on file  . Highest education level: Not on file  Occupational History  . Not on file  Social Needs  . Financial resource strain: Not on file  . Food insecurity    Worry: Not on file    Inability: Not on file  . Transportation needs    Medical: Not on file    Non-medical: Not on file  Tobacco Use  . Smoking status: Never Smoker  . Smokeless tobacco: Never Used  Substance and Sexual Activity  . Alcohol use: Never    Frequency: Never  . Drug use: Never  . Sexual activity: Not on file  Lifestyle  . Physical activity    Days per week: Not on file    Minutes per session: Not on file  . Stress: Not on file  Relationships  . Social Musician on phone: Not on file    Gets together: Not on file    Attends religious service: Not on file    Active member of club or organization: Not on file    Attends meetings of clubs or organizations: Not on file    Relationship status: Not on file  Other Topics Concern  . Not on file  Social History Narrative  . Not on file     Family History: The patient's family history includes CAD in her mother; Colon cancer in her father; Heart failure in her mother; Hypertension in her mother.  ROS:   Review of Systems  Constitution: Negative for decreased appetite, fever and weight gain.  HENT: Negative for congestion, ear discharge, hoarse voice and  sore throat.   Eyes: Negative for discharge, redness, vision loss in right eye and visual halos.  Cardiovascular: Negative for chest pain, dyspnea on exertion, leg swelling, orthopnea and palpitations.  Respiratory: Negative for cough, hemoptysis, shortness of breath and snoring.   Endocrine: Negative for heat intolerance and polyphagia.  Hematologic/Lymphatic: Negative for bleeding problem. Does not bruise/bleed easily.  Skin: Negative for flushing, nail changes, rash and suspicious lesions.  Musculoskeletal: Negative for arthritis, joint pain, muscle cramps, myalgias, neck pain and stiffness.  Gastrointestinal: Negative for abdominal pain, bowel incontinence, diarrhea and  excessive appetite.  Genitourinary: Negative for decreased libido, genital sores and incomplete emptying.  Neurological: Negative for brief paralysis, focal weakness, headaches and loss of balance.  Psychiatric/Behavioral: Negative for altered mental status, depression and suicidal ideas.  Allergic/Immunologic: Negative for HIV exposure and persistent infections.    EKGs/Labs/Other Studies Reviewed:    The following studies were reviewed today:   EKG: None today  Previous CT scans reviewed.  Recent Labs: 02/16/2019: NT-Pro BNP 7,906 05/19/2019: ALT 21; TSH 0.844 05/21/2019: B Natriuretic Peptide 2,659.4 05/30/2019: Hemoglobin 15.9; Platelets 262 06/16/2019: BUN 18; Creatinine, Ser 1.08; Magnesium 2.0; Potassium 4.7; Sodium 141  Recent Lipid Panel    Component Value Date/Time   CHOL 96 05/20/2019 0441   TRIG 90 05/20/2019 0441   HDL 15 (L) 05/20/2019 0441   CHOLHDL 6.4 05/20/2019 0441   VLDL 18 05/20/2019 0441   LDLCALC 63 05/20/2019 0441    Physical Exam:    VS:  BP 118/90 (BP Location: Left Arm, Patient Position: Sitting, Cuff Size: Normal)   Pulse 93   Ht 5\' 8"  (1.727 m)   Wt 233 lb (105.7 kg)   SpO2 98%   BMI 35.43 kg/m     Wt Readings from Last 3 Encounters:  07/18/19 233 lb (105.7 kg)   06/16/19 242 lb (109.8 kg)  05/21/19 248 lb 14.4 oz (112.9 kg)     GEN: Well nourished, well developed in no acute distress HEENT: Normal NECK: No JVD; No carotid bruits LYMPHATICS: No lymphadenopathy CARDIAC: S1S2 noted,RRR, no murmurs, rubs, gallops RESPIRATORY:  Clear to auscultation without rales, wheezing or rhonchi  ABDOMEN: Soft, non-tender, non-distended, +bowel sounds, no guarding. EXTREMITIES: No edema, No cyanosis, no clubbing MUSCULOSKELETAL:  No edema; No deformity  SKIN: Warm and dry NEUROLOGIC:  Alert and oriented x 3, non-focal PSYCHIATRIC:  Normal affect, good insight  ASSESSMENT:    1. Essential hypertension   2. Nonischemic cardiomyopathy [facility](HCC) cardiac catheterization May 2020 showed normal coronaries, ejection fraction 30%   3. Cerebrovascular accident (CVA) due to embolism of right middle cerebral artery (HCC)   4. Uncontrolled type 2 diabetes mellitus with hyperglycemia (HCC)    PLAN:    1.  The patient appears to be doing well at this time from a cardiovascular standpoint.  No changes in her medications today.  She is encouraged to continue with the Coumadin clinic, her INR goal is 2-3.  2.  Diabetes mellitus management per PCP.    The patient is in agreement with the above plan. The patient left the office in stable condition.  The patient will follow up in 3 months with Dr. Bing MatterKrasowski   Medication Adjustments/Labs and Tests Ordered: Current medicines are reviewed at length with the patient today.  Concerns regarding medicines are outlined above.  No orders of the defined types were placed in this encounter.  No orders of the defined types were placed in this encounter.   Patient Instructions  Medication Instructions:  Your physician recommends that you continue on your current medications as directed. Please refer to the Current Medication list given to you today.  *If you need a refill on your cardiac medications before your next appointment,  please call your pharmacy*  Lab Work: None If you have labs (blood work) drawn today and your tests are completely normal, you will receive your results only by: Marland Kitchen. MyChart Message (if you have MyChart) OR . A paper copy in the mail If you have any lab test that is abnormal or we need to  change your treatment, we will call you to review the results.  Testing/Procedures: None  Follow-Up: At [facility], you and your health needs are our priority.  As part of our continuing mission to provide you with exceptional heart care, we have created designated Provider Care Teams.  These Care Teams include your primary Cardiologist (physician) and Advanced Practice Providers (APPs -  Physician Assistants and Nurse Practitioners) who all work together to provide you with the care you need, when you need it.  Your next appointment:   3 month(s)  The format for your next appointment:   In Person  Provider:   Gypsy Balsam, MD  Other Instructions '    Adopting a Healthy Lifestyle.  Know what a healthy weight is for you (roughly BMI <25) and aim to maintain this   Aim for 7+ servings of fruits and vegetables daily   65-80+ fluid ounces of water or unsweet tea for healthy kidneys   Limit to max 1 drink of alcohol per day; avoid smoking/tobacco   Limit animal fats in diet for cholesterol and heart health - choose grass fed whenever available   Avoid highly processed foods, and foods high in saturated/trans fats   Aim for low stress - take time to unwind and care for your mental health   Aim for 150 min of moderate intensity exercise weekly for heart health, and weights twice weekly for bone health   Aim for 7-9 hours of sleep daily   When it comes to diets, agreement about the perfect plan isnt easy to find, even among the experts. Experts at the [facility] [employer] developed an idea known as the Healthy Eating Plate. Just imagine a plate divided into logical, healthy  portions.   The emphasis is on diet quality:   Load up on vegetables and fruits - one-half of your plate: Aim for color and variety, and remember that potatoes dont count.   Go for whole grains - one-quarter of your plate: Whole wheat, barley, wheat berries, quinoa, oats, brown rice, and foods made with them. If you want pasta, go with whole wheat pasta.   Protein power - one-quarter of your plate: Fish, chicken, beans, and nuts are all healthy, versatile protein sources. Limit red meat.   The diet, however, does go beyond the plate, offering a few other suggestions.   Use healthy plant oils, such as olive, canola, soy, corn, sunflower and peanut. Check the labels, and avoid partially hydrogenated oil, which have unhealthy trans fats.   If youre thirsty, drink water. Coffee and tea are good in moderation, but skip sugary drinks and limit milk and dairy products to one or two daily servings.   The type of carbohydrate in the diet is more important than the amount. Some sources of carbohydrates, such as vegetables, fruits, whole grains, and beans-are healthier than others.   Finally, stay active  Signed, Thomasene Ripple, DO  07/18/2019 12:22 PM    [facility]

## 2019-07-26 ENCOUNTER — Ambulatory Visit (INDEPENDENT_AMBULATORY_CARE_PROVIDER_SITE_OTHER): Payer: BLUE CROSS/BLUE SHIELD | Admitting: *Deleted

## 2019-07-26 ENCOUNTER — Other Ambulatory Visit (INDEPENDENT_AMBULATORY_CARE_PROVIDER_SITE_OTHER): Payer: BLUE CROSS/BLUE SHIELD

## 2019-07-26 ENCOUNTER — Other Ambulatory Visit: Payer: Self-pay

## 2019-07-26 DIAGNOSIS — I63411 Cerebral infarction due to embolism of right middle cerebral artery: Secondary | ICD-10-CM

## 2019-07-26 DIAGNOSIS — Z9229 Personal history of other drug therapy: Secondary | ICD-10-CM

## 2019-07-26 LAB — POCT INR: INR: 3.3 — AB (ref 2.0–3.0)

## 2019-07-26 NOTE — Patient Instructions (Signed)
Continue taking warfarin 1/2 tablet daily except none on Tuesdays Increase Vit K foods/Be consistent with weekly servings Recheck in 2 weeks

## 2019-08-02 ENCOUNTER — Other Ambulatory Visit (INDEPENDENT_AMBULATORY_CARE_PROVIDER_SITE_OTHER): Payer: BLUE CROSS/BLUE SHIELD

## 2019-08-02 ENCOUNTER — Other Ambulatory Visit: Payer: Self-pay

## 2019-08-02 ENCOUNTER — Ambulatory Visit (INDEPENDENT_AMBULATORY_CARE_PROVIDER_SITE_OTHER): Payer: BLUE CROSS/BLUE SHIELD

## 2019-08-02 DIAGNOSIS — I63411 Cerebral infarction due to embolism of right middle cerebral artery: Secondary | ICD-10-CM | POA: Diagnosis not present

## 2019-08-02 DIAGNOSIS — Z9229 Personal history of other drug therapy: Secondary | ICD-10-CM

## 2019-08-02 LAB — POCT INR: INR: 3.3 — AB (ref 2.0–3.0)

## 2019-08-02 NOTE — Patient Instructions (Signed)
Description   Start taking warfarin 1/2 tablet daily except none on Tuesdays and Fridays. Recheck in 2 weeks.

## 2019-08-05 ENCOUNTER — Telehealth: Payer: Self-pay | Admitting: Emergency Medicine

## 2019-08-05 NOTE — Telephone Encounter (Signed)
Current dosing instructions:  2.5 mg qd x 0 Tues/Fri.  Patient missed Thursday evening dose.   LMOM for patient to take 1/2 tablet today (has 5 mg tabs) rather than skip 2 days in a row.

## 2019-08-05 NOTE — Telephone Encounter (Signed)
Patient called in and reported that she skipped her coumadin dose last night and needs guidance on what to do next. Gave her coumadin clinic number but will route to coumadin clinic as well.

## 2019-08-09 ENCOUNTER — Other Ambulatory Visit: Payer: Self-pay

## 2019-08-09 ENCOUNTER — Other Ambulatory Visit (INDEPENDENT_AMBULATORY_CARE_PROVIDER_SITE_OTHER): Payer: BLUE CROSS/BLUE SHIELD

## 2019-08-09 ENCOUNTER — Ambulatory Visit (INDEPENDENT_AMBULATORY_CARE_PROVIDER_SITE_OTHER): Payer: BLUE CROSS/BLUE SHIELD | Admitting: Pharmacist

## 2019-08-09 DIAGNOSIS — Z9229 Personal history of other drug therapy: Secondary | ICD-10-CM | POA: Diagnosis not present

## 2019-08-09 DIAGNOSIS — I63411 Cerebral infarction due to embolism of right middle cerebral artery: Secondary | ICD-10-CM

## 2019-08-09 LAB — POCT INR: INR: 2.1 (ref 2.0–3.0)

## 2019-08-12 ENCOUNTER — Ambulatory Visit: Payer: BLUE CROSS/BLUE SHIELD | Admitting: Cardiology

## 2019-08-12 LAB — HM MAMMOGRAPHY

## 2019-08-16 ENCOUNTER — Other Ambulatory Visit: Payer: Self-pay

## 2019-08-16 MED ORDER — WARFARIN SODIUM 5 MG PO TABS: 5.0000 mg | ORAL_TABLET | Freq: Every day | ORAL | 0 refills | Status: DC

## 2019-08-16 MED ORDER — CARVEDILOL 6.25 MG PO TABS: ORAL_TABLET | ORAL | 1 refills | Status: DC

## 2019-08-17 ENCOUNTER — Other Ambulatory Visit: Payer: Self-pay | Admitting: Cardiology

## 2019-08-23 ENCOUNTER — Other Ambulatory Visit (INDEPENDENT_AMBULATORY_CARE_PROVIDER_SITE_OTHER): Payer: BLUE CROSS/BLUE SHIELD

## 2019-08-23 ENCOUNTER — Ambulatory Visit (INDEPENDENT_AMBULATORY_CARE_PROVIDER_SITE_OTHER): Payer: BLUE CROSS/BLUE SHIELD | Admitting: *Deleted

## 2019-08-23 ENCOUNTER — Other Ambulatory Visit: Payer: Self-pay

## 2019-08-23 DIAGNOSIS — Z9229 Personal history of other drug therapy: Secondary | ICD-10-CM | POA: Diagnosis not present

## 2019-08-23 DIAGNOSIS — I63411 Cerebral infarction due to embolism of right middle cerebral artery: Secondary | ICD-10-CM

## 2019-08-23 LAB — POCT INR: INR: 1.9 — AB (ref 2.0–3.0)

## 2019-08-23 MED ORDER — WARFARIN SODIUM 2.5 MG PO TABS: ORAL_TABLET | ORAL | 3 refills | Status: DC

## 2019-08-23 NOTE — Patient Instructions (Signed)
Change to Warfarin 2.5mg  tablet    New Rx sent to [facility] Take 1 tablet daily except 1/2 tablet on Mondays, Wednesdays and Fridays Recheck in 3 weeks

## 2019-08-30 ENCOUNTER — Other Ambulatory Visit: Payer: Self-pay

## 2019-08-30 NOTE — Patient Outreach (Signed)
First attempt to obtain mRs. No answer. Left message for returned call.   Graves Nipp Care Management Assistant  

## 2019-09-08 ENCOUNTER — Other Ambulatory Visit: Payer: Self-pay

## 2019-09-08 NOTE — Patient Outreach (Signed)
Telephone outreach to patient to obtain mRS was successfully completed. MRS=3   Breanna Bennett Care Management Assistant  

## 2019-09-13 ENCOUNTER — Ambulatory Visit (INDEPENDENT_AMBULATORY_CARE_PROVIDER_SITE_OTHER): Payer: BLUE CROSS/BLUE SHIELD | Admitting: *Deleted

## 2019-09-13 ENCOUNTER — Other Ambulatory Visit (INDEPENDENT_AMBULATORY_CARE_PROVIDER_SITE_OTHER): Payer: BLUE CROSS/BLUE SHIELD

## 2019-09-13 ENCOUNTER — Other Ambulatory Visit: Payer: Self-pay

## 2019-09-13 DIAGNOSIS — I63411 Cerebral infarction due to embolism of right middle cerebral artery: Secondary | ICD-10-CM | POA: Diagnosis not present

## 2019-09-13 DIAGNOSIS — Z9229 Personal history of other drug therapy: Secondary | ICD-10-CM | POA: Diagnosis not present

## 2019-09-13 LAB — POCT INR: INR: 2.2 (ref 2.0–3.0)

## 2019-09-13 MED ORDER — WARFARIN SODIUM 2.5 MG PO TABS: ORAL_TABLET | ORAL | 0 refills | Status: DC

## 2019-09-13 NOTE — Patient Instructions (Signed)
Continue warfarin 1 tablet daily except 1/2 tablet on Mondays, Wednesdays and Fridays Recheck in 4 weeks 

## 2019-10-04 ENCOUNTER — Ambulatory Visit (INDEPENDENT_AMBULATORY_CARE_PROVIDER_SITE_OTHER): Payer: BLUE CROSS/BLUE SHIELD | Admitting: *Deleted

## 2019-10-04 ENCOUNTER — Other Ambulatory Visit (INDEPENDENT_AMBULATORY_CARE_PROVIDER_SITE_OTHER): Payer: BLUE CROSS/BLUE SHIELD

## 2019-10-04 ENCOUNTER — Other Ambulatory Visit: Payer: Self-pay

## 2019-10-04 DIAGNOSIS — Z9229 Personal history of other drug therapy: Secondary | ICD-10-CM

## 2019-10-04 DIAGNOSIS — I63411 Cerebral infarction due to embolism of right middle cerebral artery: Secondary | ICD-10-CM | POA: Diagnosis not present

## 2019-10-04 LAB — POCT INR: INR: 2.2 (ref 2.0–3.0)

## 2019-10-04 NOTE — Patient Instructions (Signed)
Continue warfarin 1 tablet daily except 1/2 tablet on Mondays, Wednesdays and Fridays Recheck in 6 weeks.  

## 2019-10-05 ENCOUNTER — Other Ambulatory Visit: Payer: Self-pay

## 2019-10-05 DIAGNOSIS — Z9229 Personal history of other drug therapy: Secondary | ICD-10-CM

## 2019-10-05 MED ORDER — WARFARIN SODIUM 2.5 MG PO TABS: ORAL_TABLET | ORAL | 0 refills | Status: DC

## 2019-10-05 NOTE — Telephone Encounter (Signed)
This encounter was created in error - please disregard.

## 2019-10-25 NOTE — Telephone Encounter (Signed)
Attempted phone call

## 2019-10-31 ENCOUNTER — Ambulatory Visit: Payer: BLUE CROSS/BLUE SHIELD | Admitting: Cardiology

## 2019-11-07 ENCOUNTER — Other Ambulatory Visit: Payer: Self-pay | Admitting: Cardiology

## 2019-11-07 MED ORDER — SPIRONOLACTONE 25 MG PO TABS: 12.5000 mg | ORAL_TABLET | Freq: Every day | ORAL | 1 refills | Status: DC

## 2019-11-07 NOTE — Telephone Encounter (Signed)
*  STAT* If patient is at the pharmacy, call can be transferred to refill team.   1. Which medications need to be refilled? (please list name of each medication and dose if known)  spironolactone (ALDACTONE) 25 MG tablet  2. Which pharmacy/location (including street and city if local pharmacy) is medication to be sent to?  [id number] - [place], [place] - [address] AT [address]  3. Do they need a 30 day or 90 day supply? 90

## 2019-11-07 NOTE — Telephone Encounter (Signed)
Informed pt her rx for Sp[ironolactone has been sent to [facility].

## 2019-11-15 ENCOUNTER — Other Ambulatory Visit (INDEPENDENT_AMBULATORY_CARE_PROVIDER_SITE_OTHER): Payer: Self-pay

## 2019-11-15 ENCOUNTER — Ambulatory Visit (INDEPENDENT_AMBULATORY_CARE_PROVIDER_SITE_OTHER): Payer: BLUE CROSS/BLUE SHIELD | Admitting: *Deleted

## 2019-11-15 ENCOUNTER — Other Ambulatory Visit: Payer: Self-pay

## 2019-11-15 DIAGNOSIS — Z9229 Personal history of other drug therapy: Secondary | ICD-10-CM

## 2019-11-15 DIAGNOSIS — I63411 Cerebral infarction due to embolism of right middle cerebral artery: Secondary | ICD-10-CM

## 2019-11-15 LAB — POCT INR: INR: 3.7 — AB (ref 2.0–3.0)

## 2019-11-15 NOTE — Patient Instructions (Signed)
Hold warfarin tonight then resume 1 tablet daily except 1/2 tablet on Mondays, Wednesdays and Fridays Have extra greens/salad tonight or tomorrow Recheck in 3 weeks

## 2019-12-06 ENCOUNTER — Other Ambulatory Visit: Payer: Self-pay

## 2019-12-06 ENCOUNTER — Ambulatory Visit (INDEPENDENT_AMBULATORY_CARE_PROVIDER_SITE_OTHER): Payer: Self-pay | Admitting: *Deleted

## 2019-12-06 DIAGNOSIS — I63411 Cerebral infarction due to embolism of right middle cerebral artery: Secondary | ICD-10-CM

## 2019-12-06 DIAGNOSIS — R600 Localized edema: Secondary | ICD-10-CM | POA: Insufficient documentation

## 2019-12-06 DIAGNOSIS — Z9229 Personal history of other drug therapy: Secondary | ICD-10-CM

## 2019-12-06 DIAGNOSIS — R609 Edema, unspecified: Secondary | ICD-10-CM

## 2019-12-06 DIAGNOSIS — E876 Hypokalemia: Secondary | ICD-10-CM

## 2019-12-06 DIAGNOSIS — Z8739 Personal history of other diseases of the musculoskeletal system and connective tissue: Secondary | ICD-10-CM

## 2019-12-06 HISTORY — DX: Edema, unspecified: R60.9

## 2019-12-06 HISTORY — DX: Localized edema: R60.0

## 2019-12-06 HISTORY — DX: Hypokalemia: E87.6

## 2019-12-06 HISTORY — DX: Personal history of other diseases of the musculoskeletal system and connective tissue: Z87.39

## 2019-12-06 LAB — POCT INR: INR: 2.8 (ref 2.0–3.0)

## 2019-12-06 NOTE — Patient Instructions (Signed)
Continue warfarin 1 tablet daily except 1/2 tablet on Mondays, Wednesdays and Fridays Be consistent with greens Recheck in 4 weeks 

## 2019-12-12 ENCOUNTER — Ambulatory Visit (INDEPENDENT_AMBULATORY_CARE_PROVIDER_SITE_OTHER): Payer: BLUE CROSS/BLUE SHIELD | Admitting: Cardiology

## 2019-12-12 ENCOUNTER — Other Ambulatory Visit: Payer: Self-pay

## 2019-12-12 ENCOUNTER — Encounter: Payer: Self-pay | Admitting: Cardiology

## 2019-12-12 VITALS — BP 112/80 | HR 84 | Ht 68.0 in

## 2019-12-12 DIAGNOSIS — I428 Other cardiomyopathies: Secondary | ICD-10-CM | POA: Diagnosis not present

## 2019-12-12 DIAGNOSIS — I6930 Unspecified sequelae of cerebral infarction: Secondary | ICD-10-CM

## 2019-12-12 DIAGNOSIS — I10 Essential (primary) hypertension: Secondary | ICD-10-CM

## 2019-12-12 DIAGNOSIS — E782 Mixed hyperlipidemia: Secondary | ICD-10-CM | POA: Diagnosis not present

## 2019-12-12 DIAGNOSIS — I5042 Chronic combined systolic (congestive) and diastolic (congestive) heart failure: Secondary | ICD-10-CM

## 2019-12-12 HISTORY — DX: Unspecified sequelae of cerebral infarction: I69.30

## 2019-12-12 NOTE — Patient Instructions (Signed)
Medication Instructions:  Your physician recommends that you continue on your current medications as directed. Please refer to the Current Medication list given to you today.  *If you need a refill on your cardiac medications before your next appointment, please call your pharmacy*   Lab Work: BNP AND BMET TODAY  If you have labs (blood work) drawn today and your tests are completely normal, you will receive your results only by: Marland Kitchen MyChart Message (if you have MyChart) OR . A paper copy in the mail If you have any lab test that is abnormal or we need to change your treatment, we will call you to review the results.   Testing/Procedures: none   Follow-Up: At [facility], you and your health needs are our priority.  As part of our continuing mission to provide you with exceptional heart care, we have created designated Provider Care Teams.  These Care Teams include your primary Cardiologist (physician) and Advanced Practice Providers (APPs -  Physician Assistants and Nurse Practitioners) who all work together to provide you with the care you need, when you need it.  We recommend signing up for the patient portal called "MyChart".  Sign up information is provided on this After Visit Summary.  MyChart is used to connect with patients for Virtual Visits (Telemedicine).  Patients are able to view lab/test results, encounter notes, upcoming appointments, etc.  Non-urgent messages can be sent to your provider as well.   To learn more about what you can do with MyChart, go to [email or web address].    Your next appointment:   2 week(s)  The format for your next appointment:   In Person  Provider:   Gypsy Balsam, MD   Other Instructions

## 2019-12-12 NOTE — Progress Notes (Signed)
Cardiology Office Note:    Date:  12/12/2019   ID:  Breanna Bennett, DOB 23-Dec-1955, MRN [id number]  PCP:  Laurel Dimmer, FNP  Cardiologist:  Gypsy Balsam, MD    Referring MD: Selena Batten *   No chief complaint on file. Am doing fine but have swollen legs  History of Present Illness:    Breanna Bennett is a 64 y.o. female with past medical history significant for nonischemic cardiomyopathy with ejection fraction between 20 to 30%.  Also few months ago she presented to the hospital with acute stroke she did have direct thrombectomy done and she recovered quite nicely after that.  She showed up in my office after not seeing me for few months.  She looks quite good.  Swelling of lower extremities there she got few open wounds located in her both shins but those are looking clean.  .  Denies having paroxysmal nocturnal dyspnea, no chest pain tightness squeezing pressure burning chest.  Previously have difficulty putting her on 12 medications.  She was not able to lisinopril.  Also consideration was given to BiDil combination.  However then she disappeared from follow-up.  I am afraid we have to stop kind of start from scratch is.  Past Medical History:  Diagnosis Date  . Chronic systolic CHF (congestive heart failure) (HCC)   . Elevated hemoglobin (HCC)   . GERD (gastroesophageal reflux disease)   . Hyperlipidemia   . Hypertension   . LBBB (left bundle branch block)   . Mild renal insufficiency   . Mitral regurgitation   . NICM (nonischemic cardiomyopathy) (HCC)   . Pre-diabetes   . Tricuspid regurgitation     Past Surgical History:  Procedure Laterality Date  . IR ANGIO VERTEBRAL SEL SUBCLAVIAN INNOMINATE UNI R MOD SED  05/19/2019  . IR CT HEAD LTD  05/19/2019  . IR PERCUTANEOUS ART THROMBECTOMY/INFUSION INTRACRANIAL INC DIAG ANGIO  05/19/2019  . RADIOLOGY WITH ANESTHESIA N/A 05/19/2019   Procedure: IR WITH ANESTHESIA;  Surgeon: Julieanne Cotton, MD;   Location: [facility];  Service: Radiology;  Laterality: N/A;  . RIGHT/LEFT HEART CATH AND CORONARY ANGIOGRAPHY N/A 01/13/2019   Procedure: RIGHT/LEFT HEART CATH AND CORONARY ANGIOGRAPHY;  Surgeon: Runell Gess, MD;  Location: [facility];  Service: Cardiovascular;  Laterality: N/A;  . TUBAL LIGATION      Current Medications: Current Meds  Medication Sig  . albuterol (VENTOLIN HFA) 108 (90 Base) MCG/ACT inhaler Inhale 2 puffs into the lungs every 4 (four) hours as needed for shortness of breath.  . allopurinol (ZYLOPRIM) 100 MG tablet Take 100 mg by mouth 2 (two) times daily.  Marland Kitchen atorvastatin (LIPITOR) 10 MG tablet Take 10 mg by mouth daily.  . carvedilol (COREG) 6.25 MG tablet TAKE 1 TABLET(6.25 MG) BY MOUTH TWICE DAILY WITH A MEAL  . famotidine (PEPCID) 40 MG tablet Take 40 mg by mouth at bedtime.   . fluticasone (FLONASE) 50 MCG/ACT nasal spray Place 2 sprays into both nostrils daily as needed for allergies or rhinitis.   . furosemide (LASIX) 40 MG tablet TAKE 1 TABLET(40 MG) BY MOUTH TWICE DAILY (Patient taking differently: Take 40 mg by mouth daily. )  . glipiZIDE (GLUCOTROL XL) 2.5 MG 24 hr tablet Take 2.5 mg by mouth daily.  Marland Kitchen loratadine (CLARITIN) 10 MG tablet Take 10 mg by mouth daily as needed for allergies or rhinitis.   . magnesium oxide (MAG-OX) 400 MG tablet Take 400 mg by mouth 3 (three) times daily.  Marland Kitchen  montelukast (SINGULAIR) 10 MG tablet Take 10 mg by mouth at bedtime.  . potassium chloride (KLOR-CON) 10 MEQ tablet TAKE 1 TABLET(10 MEQ) BY MOUTH DAILY  . spironolactone (ALDACTONE) 25 MG tablet Take 0.5 tablets (12.5 mg total) by mouth daily.  . Vitamin D, Ergocalciferol, (DRISDOL) 1.25 MG (50000 UT) CAPS capsule Take 50,000 Units by mouth every Monday.   . warfarin (COUMADIN) 2.5 MG tablet Take 1 tablet daily except 1/2 tablet on Mondays, Wednesdays and Fridays or as directed     Allergies:   Lisinopril and Losartan   Social History   Socioeconomic History  .  Marital status: Unknown    Spouse name: Not on file  . Number of children: Not on file  . Years of education: Not on file  . Highest education level: Not on file  Occupational History  . Not on file  Tobacco Use  . Smoking status: Never Smoker  . Smokeless tobacco: Never Used  Substance and Sexual Activity  . Alcohol use: Never  . Drug use: Never  . Sexual activity: Not on file  Other Topics Concern  . Not on file  Social History Narrative  . Not on file   Social Determinants of Health   Financial Resource Strain:   . Difficulty of Paying Living Expenses:   Food Insecurity:   . Worried About Charity fundraiser in the Last Year:   . Arboriculturist in the Last Year:   Transportation Needs:   . Film/video editor (Medical):   Marland Kitchen Lack of Transportation (Non-Medical):   Physical Activity:   . Days of Exercise per Week:   . Minutes of Exercise per Session:   Stress:   . Feeling of Stress :   Social Connections:   . Frequency of Communication with Friends and Family:   . Frequency of Social Gatherings with Friends and Family:   . Attends Religious Services:   . Active Member of Clubs or Organizations:   . Attends Archivist Meetings:   Marland Kitchen Marital Status:      Family History: The patient's family history includes CAD in her mother; Colon cancer in her father; Heart failure in her mother; Hypertension in her mother. ROS:   Please see the history of present illness.    All 14 point review of systems negative except as described per history of present illness  EKGs/Labs/Other Studies Reviewed:      Recent Labs: 02/16/2019: NT-Pro BNP 7,906 05/19/2019: ALT 21; TSH 0.844 05/21/2019: B Natriuretic Peptide 2,659.4 05/30/2019: Hemoglobin 15.9; Platelets 262 06/16/2019: BUN 18; Creatinine, Ser 1.08; Magnesium 2.0; Potassium 4.7; Sodium 141  Recent Lipid Panel    Component Value Date/Time   CHOL 96 05/20/2019 0441   TRIG 90 05/20/2019 0441   HDL 15 (L)  05/20/2019 0441   CHOLHDL 6.4 05/20/2019 0441   VLDL 18 05/20/2019 0441   LDLCALC 63 05/20/2019 0441    Physical Exam:    VS:  BP 112/80   Pulse 84   Ht 5\' 8"  (1.727 m)   SpO2 97%   BMI 35.43 kg/m     Wt Readings from Last 3 Encounters:  07/18/19 233 lb (105.7 kg)  06/16/19 242 lb (109.8 kg)  05/21/19 248 lb 14.4 oz (112.9 kg)     GEN:  Well nourished, well developed in no acute distress HEENT: Normal NECK: No JVD; No carotid bruits LYMPHATICS: No lymphadenopathy CARDIAC: RRR, no murmurs, no rubs, no gallops RESPIRATORY:  Clear to auscultation  without rales, wheezing or rhonchi  ABDOMEN: Soft, non-tender, non-distended MUSCULOSKELETAL:  No edema; No deformity  SKIN: Warm and dry LOWER EXTREMITIES: no swelling NEUROLOGIC:  Alert and oriented x 3 PSYCHIATRIC:  Normal affect   ASSESSMENT:    1. Chronic combined systolic and diastolic congestive heart failure, NYHA class 2 (HCC)   2. Essential hypertension   3. Nonischemic cardiomyopathy [facility]) cardiac catheterization May 2020 showed normal coronaries, ejection fraction 30%   4. Mixed hyperlipidemia   5. Late effect of cerebrovascular accident (CVA)    PLAN:    In order of problems listed above:  1. Chronic systolic with diastolic congestive heart failure.  New York Heart Association class II-III.  I will ask her to have proBNP as well as Chem-7 done today and based on that will decide about diuretic.  Apparently she is taking 40 mg of furosemide every day.  In addition we will try also put her on ACE inhibitor.  Previously she was unable to afford Entresto. 2. Essential hypertension blood pressure controlled but 3. Nonischemic cardiomyopathy with ejection fraction of 20 to 30%.  We will schedule her to have Chem-7 as well as proBNP to decide about therapy. 4. Mixed dyslipidemia: She is on Lipitor 10 which I will continue. 5. History of CVA, status post direct lobectomy.  Recovered quite nicely doing well.  On  Coumadin.   Medication Adjustments/Labs and Tests Ordered: Current medicines are reviewed at length with the patient today.  Concerns regarding medicines are outlined above.  No orders of the defined types were placed in this encounter.  Medication changes: No orders of the defined types were placed in this encounter.   Signed, Georgeanna Lea, MD, [facility] 12/12/2019 3:41 PM    [facility]

## 2019-12-13 LAB — BASIC METABOLIC PANEL
BUN/Creatinine Ratio: 20 (ref 12–28)
BUN: 17 mg/dL (ref 8–27)
CO2: 22 mmol/L (ref 20–29)
Calcium: 9.1 mg/dL (ref 8.7–10.3)
Chloride: 102 mmol/L (ref 96–106)
Creatinine, Ser: 0.83 mg/dL (ref 0.57–1.00)
GFR calc Af Amer: 87 mL/min/{1.73_m2} (ref >59)
GFR calc non Af Amer: 75 mL/min/{1.73_m2} (ref >59)
Glucose: 128 mg/dL — ABNORMAL HIGH (ref 65–99)
Potassium: 3.7 mmol/L (ref 3.5–5.2)
Sodium: 140 mmol/L (ref 134–144)

## 2019-12-13 LAB — PRO B NATRIURETIC PEPTIDE: NT-Pro BNP: 12655 pg/mL — ABNORMAL HIGH (ref 0–287)

## 2019-12-20 ENCOUNTER — Telehealth: Payer: Self-pay | Admitting: Emergency Medicine

## 2019-12-20 NOTE — Telephone Encounter (Signed)
Called patient regarding lab result note from Dr. Bing Matter below. Went to inform her of the medications changes. She is already taking lasix 40 mg daily and potasium 10 meq daily. Will consult with DOD to see how they would like to proceed.            Breanna Lea, MD  12/18/2019 7:04 PM EDT    Still got evidence of quite significant congestive heart failure, start furosemide 40 mg daily, also add 10 mEq of potassium, Chem-7 and proBNP in

## 2019-12-21 NOTE — Telephone Encounter (Signed)
Spoke with the patients daughter and informed her of Dr. Thersa Salt advisement to double her potassium and lasix making that of Potassium daily and 80mg  Lasix daily.   She states that patients PCP recently changed pts Lasix dosage from 20mg  to 40mg  and she does not feel comfortable going up again to 80 mg so quickly.   Will route to BM to advise

## 2019-12-21 NOTE — Telephone Encounter (Signed)
Spoke with the patients grandson who said Breanna Bennett was unavailable at this time. Asked to have the patient return our call to the office as soon as possible. He said he would let her know.

## 2019-12-21 NOTE — Telephone Encounter (Signed)
I would double both her potassium and her Lasix and arrange in 2 to 3 weeks to have a follow-up BMP performed

## 2019-12-22 NOTE — Telephone Encounter (Signed)
I think the best approach here is to reconcile meds in the chart so we have a correct list.  I would increase her furosemide back to 40 mEq today keep her potassium the same and I think she needs to come to the office for a face-to-face meeting with Dr. Bing Matter to talk about medications and heart failure will be sure were on the same page

## 2019-12-22 NOTE — Telephone Encounter (Signed)
Attempted to contact patient. No answer and no voicemail. Will continue efforts.

## 2019-12-30 ENCOUNTER — Ambulatory Visit: Payer: BLUE CROSS/BLUE SHIELD | Admitting: Cardiology

## 2020-01-02 NOTE — Telephone Encounter (Signed)
Attempted to call patient no answer and no voicemail. She no showed for her appt. Last week will continue efforts.

## 2020-01-03 ENCOUNTER — Other Ambulatory Visit: Payer: Self-pay

## 2020-01-03 ENCOUNTER — Ambulatory Visit (INDEPENDENT_AMBULATORY_CARE_PROVIDER_SITE_OTHER): Payer: BLUE CROSS/BLUE SHIELD | Admitting: *Deleted

## 2020-01-03 DIAGNOSIS — Z9229 Personal history of other drug therapy: Secondary | ICD-10-CM | POA: Diagnosis not present

## 2020-01-03 DIAGNOSIS — I639 Cerebral infarction, unspecified: Secondary | ICD-10-CM | POA: Diagnosis not present

## 2020-01-03 LAB — POCT INR: INR: 2.3 (ref 2.0–3.0)

## 2020-01-03 NOTE — Patient Instructions (Signed)
Continue warfarin 1 tablet daily except 1/2 tablet on Mondays, Wednesdays and Fridays Be consistent with greens Recheck in 4 weeks

## 2020-01-09 NOTE — Telephone Encounter (Signed)
Attempted to call patient again; no answer and no voicemail.

## 2020-01-11 NOTE — Telephone Encounter (Signed)
Mailed contact letter to patient to call our office to schedule an appointment to discuss her medication concerns per Dr. Dulce Sellar.

## 2020-01-11 NOTE — Progress Notes (Unsigned)
Will mail patient a contact letter to contact our office for an appointment.

## 2020-02-07 ENCOUNTER — Ambulatory Visit (INDEPENDENT_AMBULATORY_CARE_PROVIDER_SITE_OTHER): Payer: BLUE CROSS/BLUE SHIELD | Admitting: Pharmacist

## 2020-02-07 ENCOUNTER — Other Ambulatory Visit: Payer: Self-pay

## 2020-02-07 DIAGNOSIS — I6601 Occlusion and stenosis of right middle cerebral artery: Secondary | ICD-10-CM

## 2020-02-07 DIAGNOSIS — Z9229 Personal history of other drug therapy: Secondary | ICD-10-CM | POA: Diagnosis not present

## 2020-02-07 DIAGNOSIS — I639 Cerebral infarction, unspecified: Secondary | ICD-10-CM

## 2020-02-07 LAB — POCT INR: INR: 2.9 (ref 2.0–3.0)

## 2020-02-07 NOTE — Patient Instructions (Signed)
Description   Continue warfarin 1 tablet daily except 1/2 tablet on Mondays, Wednesdays and Fridays Be consistent with greens Recheck in 5 weeks

## 2020-03-11 ENCOUNTER — Other Ambulatory Visit: Payer: Self-pay | Admitting: Cardiology

## 2020-03-11 DIAGNOSIS — Z9229 Personal history of other drug therapy: Secondary | ICD-10-CM

## 2020-03-13 ENCOUNTER — Other Ambulatory Visit: Payer: Self-pay

## 2020-03-13 ENCOUNTER — Ambulatory Visit (INDEPENDENT_AMBULATORY_CARE_PROVIDER_SITE_OTHER): Payer: BLUE CROSS/BLUE SHIELD

## 2020-03-13 DIAGNOSIS — Z5181 Encounter for therapeutic drug level monitoring: Secondary | ICD-10-CM | POA: Diagnosis not present

## 2020-03-13 DIAGNOSIS — I639 Cerebral infarction, unspecified: Secondary | ICD-10-CM | POA: Diagnosis not present

## 2020-03-13 DIAGNOSIS — Z9229 Personal history of other drug therapy: Secondary | ICD-10-CM

## 2020-03-13 LAB — POCT INR: INR: 3.1 — AB (ref 2.0–3.0)

## 2020-03-13 NOTE — Patient Instructions (Signed)
Take 1/2 tablet today and then continue warfarin 1 tablet daily except 1/2 tablet on Mondays, Wednesdays and Fridays Be consistent with greens Recheck in 4 weeks

## 2020-04-17 ENCOUNTER — Ambulatory Visit (INDEPENDENT_AMBULATORY_CARE_PROVIDER_SITE_OTHER): Payer: BLUE CROSS/BLUE SHIELD | Admitting: *Deleted

## 2020-04-17 ENCOUNTER — Encounter: Payer: Self-pay | Admitting: Cardiology

## 2020-04-17 ENCOUNTER — Ambulatory Visit (INDEPENDENT_AMBULATORY_CARE_PROVIDER_SITE_OTHER): Payer: BLUE CROSS/BLUE SHIELD | Admitting: Cardiology

## 2020-04-17 ENCOUNTER — Other Ambulatory Visit: Payer: Self-pay

## 2020-04-17 VITALS — BP 104/68 | HR 83 | Ht 68.0 in | Wt 180.0 lb

## 2020-04-17 DIAGNOSIS — I428 Other cardiomyopathies: Secondary | ICD-10-CM

## 2020-04-17 DIAGNOSIS — I639 Cerebral infarction, unspecified: Secondary | ICD-10-CM

## 2020-04-17 DIAGNOSIS — Z9229 Personal history of other drug therapy: Secondary | ICD-10-CM

## 2020-04-17 DIAGNOSIS — I340 Nonrheumatic mitral (valve) insufficiency: Secondary | ICD-10-CM | POA: Diagnosis not present

## 2020-04-17 DIAGNOSIS — Z8673 Personal history of transient ischemic attack (TIA), and cerebral infarction without residual deficits: Secondary | ICD-10-CM

## 2020-04-17 DIAGNOSIS — I6601 Occlusion and stenosis of right middle cerebral artery: Secondary | ICD-10-CM

## 2020-04-17 LAB — POCT INR: INR: 3.2 — AB (ref 2.0–3.0)

## 2020-04-17 MED ORDER — WARFARIN SODIUM 2.5 MG PO TABS: ORAL_TABLET | ORAL | 0 refills | Status: DC

## 2020-04-17 NOTE — Patient Instructions (Signed)
Take 1/2 tablet today then decrease dose to 1/2 tablet daily except 1 tablet on Sundays, Tuesdays and Thursdays Be consistent with greens Recheck in 4 weeks

## 2020-04-17 NOTE — Patient Instructions (Signed)
Medication Instructions:  Your physician recommends that you continue on your current medications as directed. Please refer to the Current Medication list given to you today.  *If you need a refill on your cardiac medications before your next appointment, please call your pharmacy*   Lab Work: Cmp, Tsh, Pro Bnp- Today   If you have labs (blood work) drawn today and your tests are completely normal, you will receive your results only by: Marland Kitchen MyChart Message (if you have MyChart) OR . A paper copy in the mail If you have any lab test that is abnormal or we need to change your treatment, we will call you to review the results.   Testing/Procedures: Your physician has requested that you have an echocardiogram. Echocardiography is a painless test that uses sound waves to create images of your heart. It provides your doctor with information about the size and shape of your heart and how well your heart's chambers and valves are working. This procedure takes approximately one hour. There are no restrictions for this procedure.     Follow-Up: At [facility], you and your health needs are our priority.  As part of our continuing mission to provide you with exceptional heart care, we have created designated Provider Care Teams.  These Care Teams include your primary Cardiologist (physician) and Advanced Practice Providers (APPs -  Physician Assistants and Nurse Practitioners) who all work together to provide you with the care you need, when you need it.  We recommend signing up for the patient portal called "MyChart".  Sign up information is provided on this After Visit Summary.  MyChart is used to connect with patients for Virtual Visits (Telemedicine).  Patients are able to view lab/test results, encounter notes, upcoming appointments, etc.  Non-urgent messages can be sent to your provider as well.   To learn more about what you can do with MyChart, go to [email or web address].    Your next  appointment:   2 month(s)  The format for your next appointment:   In Person  Provider:   Gypsy Balsam, MD   Other Instructions None

## 2020-04-17 NOTE — Progress Notes (Signed)
Cardiology Office Note:    Date:  04/17/2020   ID:  Breanna Bennett, DOB 1956-02-01, MRN [id number]  PCP:  Laurel Dimmer, FNP  Cardiologist:  Gypsy Balsam, MD    Referring MD: Selena Batten *   No chief complaint on file. I am doing fine I am happy to see you  History of Present Illness:    Breanna Bennett is a 64 y.o. female with complex past medical history that includes cardiomyopathy which is nonischemic, in 2020 she had cardiac catheterization which showed normal coronaries, ejection fraction between 20 and 30%, she also history of stroke that required direct thrombectomy and she started getting much better after that.  She is coming back to see me after a few months of not seeing me.  I am not sure exactly why she did not follow-up.  But overall seems to be doing well.  She lost significant amount of weight she is very cheerful and happy to be able to talk to me the biggest complaint of swelling of lower extremities with some open venous ulcers.  Denies have any chest pain tightness squeezing pressure burning chest she said she started doing things around her house.  She does have some fatigue and tiredness but otherwise seems to be doing well.  Past Medical History:  Diagnosis Date  . Acute CVA (cerebrovascular accident) (HCC) 05/30/2019  . Acute respiratory failure (HCC)   . Acute systolic CHF (congestive heart failure) (HCC) 01/12/2019  . Chronic systolic CHF (congestive heart failure) (HCC)   . Class 2 severe obesity due to excess calories with serious comorbidity and body mass index (BMI) of 38.0 to 38.9 in adult (HCC) 10/19/2018  . Cough due to bronchospasm 02/16/2019  . Elevated hemoglobin (HCC)   . Elevated uric acid in blood 06/06/2019  . Essential hypertension 01/12/2019  . Gastroesophageal reflux disease 10/19/2018  . GERD (gastroesophageal reflux disease)   . History of Coumadin therapy 06/16/2019  . History of CVA (cerebrovascular accident)  05/30/2019  . History of gout 12/06/2019  . History of hemiplegia 05/30/2019  . Hospital discharge follow-up 06/24/2019  . Hyperlipidemia   . Hypertension   . Hypokalemia 12/06/2019  . Impaired mobility and ADLs 05/30/2019  . Late effect of cerebrovascular accident (CVA) 12/12/2019  . LBBB (left bundle branch block)   . Left ventricular ejection fraction less than 20% 05/30/2019  . Middle cerebral artery embolism, 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  . Mild renal insufficiency   . Mitral regurgitation   . NICM (nonischemic cardiomyopathy) (HCC)   . Perennial allergic rhinitis 02/16/2019  . Peripheral edema 12/06/2019  . Pre-diabetes   . Stroke (cerebrum) (HCC) - R MCA infarct s/p mechanical thrombectomy 05/19/2019  . Systolic CHF, acute (HCC) 02/09/2019  . Tricuspid regurgitation   . Type 2 diabetes mellitus with hyperglycemia, without long-term current use of insulin (HCC) 05/30/2019  . Type 2 diabetes mellitus, uncontrolled (HCC) 05/27/2019    Past Surgical History:  Procedure Laterality Date  . IR ANGIO VERTEBRAL SEL SUBCLAVIAN INNOMINATE UNI R MOD SED  05/19/2019  . IR CT HEAD LTD  05/19/2019  . IR PERCUTANEOUS ART THROMBECTOMY/INFUSION INTRACRANIAL INC DIAG ANGIO  05/19/2019  . RADIOLOGY WITH ANESTHESIA N/A 05/19/2019   Procedure: IR WITH ANESTHESIA;  Surgeon: Julieanne Cotton, MD;  Location: [facility] OR;  Service: Radiology;  Laterality: N/A;  . RIGHT/LEFT HEART CATH AND CORONARY ANGIOGRAPHY N/A 01/13/2019   Procedure: RIGHT/LEFT HEART CATH AND CORONARY ANGIOGRAPHY;  Surgeon: Runell Gess, MD;  Location: [facility];  Service: Cardiovascular;  Laterality: N/A;  . TUBAL LIGATION      Current Medications: Current Meds  Medication Sig  . albuterol (VENTOLIN HFA) 108 (90 Base) MCG/ACT inhaler Inhale 2 puffs into the lungs every 4 (four) hours as needed for shortness of breath.  . allopurinol (ZYLOPRIM) 100 MG tablet Take 100 mg by mouth 2 (two) times daily.  Marland Kitchen atorvastatin (LIPITOR) 10 MG tablet Take  10 mg by mouth daily.  . carvedilol (COREG) 6.25 MG tablet TAKE 1 TABLET(6.25 MG) BY MOUTH TWICE DAILY WITH A MEAL  . famotidine (PEPCID) 40 MG tablet Take 40 mg by mouth at bedtime.   . fluticasone (FLONASE) 50 MCG/ACT nasal spray Place 2 sprays into both nostrils daily as needed for allergies or rhinitis.   . furosemide (LASIX) 40 MG tablet Take 40 mg by mouth daily.  Marland Kitchen glipiZIDE (GLUCOTROL XL) 2.5 MG 24 hr tablet Take 2.5 mg by mouth daily.  Marland Kitchen loratadine (CLARITIN) 10 MG tablet Take 10 mg by mouth daily as needed for allergies or rhinitis.   Marland Kitchen montelukast (SINGULAIR) 10 MG tablet Take 10 mg by mouth at bedtime.  . potassium chloride (KLOR-CON) 10 MEQ tablet TAKE 1 TABLET(10 MEQ) BY MOUTH DAILY  . spironolactone (ALDACTONE) 25 MG tablet Take 0.5 tablets (12.5 mg total) by mouth daily.  . Vitamin D, Ergocalciferol, (DRISDOL) 1.25 MG (50000 UT) CAPS capsule Take 50,000 Units by mouth every Monday.   . warfarin (COUMADIN) 2.5 MG tablet TAKE 1 TABLET BY MOUTH DAILY EXCEPT 1/2 TABLET ON MONDAY, WEDNESDAY, FRIDAY OR AS DIRECTED  . [DISCONTINUED] furosemide (LASIX) 40 MG tablet TAKE 1 TABLET(40 MG) BY MOUTH TWICE DAILY (Patient taking differently: 40 mg daily. )     Allergies:   Lisinopril and Losartan   Social History   Socioeconomic History  . Marital status: Unknown    Spouse name: Not on file  . Number of children: Not on file  . Years of education: Not on file  . Highest education level: Not on file  Occupational History  . Not on file  Tobacco Use  . Smoking status: Never Smoker  . Smokeless tobacco: Never Used  Vaping Use  . Vaping Use: Never used  Substance and Sexual Activity  . Alcohol use: Never  . Drug use: Never  . Sexual activity: Not on file  Other Topics Concern  . Not on file  Social History Narrative  . Not on file   Social Determinants of Health   Financial Resource Strain:   . Difficulty of Paying Living Expenses: Not on file  Food Insecurity:   . Worried  About Programme researcher, broadcasting/film/video in the Last Year: Not on file  . Ran Out of Food in the Last Year: Not on file  Transportation Needs:   . Lack of Transportation (Medical): Not on file  . Lack of Transportation (Non-Medical): Not on file  Physical Activity:   . Days of Exercise per Week: Not on file  . Minutes of Exercise per Session: Not on file  Stress:   . Feeling of Stress : Not on file  Social Connections:   . Frequency of Communication with Friends and Family: Not on file  . Frequency of Social Gatherings with Friends and Family: Not on file  . Attends Religious Services: Not on file  . Active Member of Clubs or Organizations: Not on file  . Attends Banker Meetings: Not on file  . Marital Status: Not on file  Family History: The patient's family history includes CAD in her mother; Colon cancer in her father; Heart failure in her mother; Hypertension in her mother. ROS:   Please see the history of present illness.    All 14 point review of systems negative except as described per history of present illness  EKGs/Labs/Other Studies Reviewed:      Recent Labs: 05/19/2019: ALT 21; TSH 0.844 05/21/2019: B Natriuretic Peptide 2,659.4 05/30/2019: Hemoglobin 15.9; Platelets 262 06/16/2019: Magnesium 2.0 12/12/2019: BUN 17; Creatinine, Ser 0.83; NT-Pro BNP 12,655; Potassium 3.7; Sodium 140  Recent Lipid Panel    Component Value Date/Time   CHOL 96 05/20/2019 0441   TRIG 90 05/20/2019 0441   HDL 15 (L) 05/20/2019 0441   CHOLHDL 6.4 05/20/2019 0441   VLDL 18 05/20/2019 0441   LDLCALC 63 05/20/2019 0441    Physical Exam:    VS:  BP 104/68   Pulse 83   Ht 5\' 8"  (1.727 m)   Wt 180 lb (81.6 kg)   SpO2 96%   BMI 27.37 kg/m     Wt Readings from Last 3 Encounters:  04/17/20 180 lb (81.6 kg)  07/18/19 233 lb (105.7 kg)  06/16/19 242 lb (109.8 kg)     GEN:  Well nourished, well developed in no acute distress HEENT: Normal NECK: No JVD; No carotid  bruits LYMPHATICS: No lymphadenopathy CARDIAC: RRR, no murmurs, no rubs, no gallops RESPIRATORY:  Clear to auscultation without rales, wheezing or rhonchi  ABDOMEN: Soft, non-tender, non-distended MUSCULOSKELETAL:  No edema; No deformity  SKIN: Warm and dry LOWER EXTREMITIES: no swelling NEUROLOGIC:  Alert and oriented x 3 PSYCHIATRIC:  Normal affect   ASSESSMENT:    1. Nonischemic cardiomyopathy [facility]) cardiac catheterization May 2020 showed normal coronaries, ejection fraction 30%   2. Nonrheumatic mitral valve regurgitation   3. History of CVA (cerebrovascular accident)   4. Middle cerebral artery embolism, right    PLAN:    In order of problems listed above:  1. Nonischemic cardiomyopathy ejection fraction 20 to 30%.  He is she is on appropriate tolerated medications.  I will ask you to have Chem-7 done today as well as liver function test and protein.  I am worried about hypoalbuminemia.  She looks much skinnier than she did before.  She said she is very well have a good appetite.  Of course the issue of potential ICD still present.  I will repeat her echocardiogram to recheck left ventricle ejection fraction. 2. Mitral regurgitation which is nonrheumatic again echocardiogram will be repeated to check it. 3. History of CVA with thrombectomy.  She is doing well from that point review recovering. 4. Low blood pressure which is out of the limiting factor in terms of putting her on appropriate medications.  We will continue monitoring to look for any opportunity to add some additional medications.   Medication Adjustments/Labs and Tests Ordered: Current medicines are reviewed at length with the patient today.  Concerns regarding medicines are outlined above.  No orders of the defined types were placed in this encounter.  Medication changes: No orders of the defined types were placed in this encounter.   Signed, June 2020, MD, [facility] 04/17/2020 4:19 PM    [facility]

## 2020-04-18 LAB — TSH: TSH: 3.32 u[IU]/mL (ref 0.450–4.500)

## 2020-04-18 LAB — COMPREHENSIVE METABOLIC PANEL
ALT: 7 IU/L (ref 0–32)
AST: 13 IU/L (ref 0–40)
Albumin/Globulin Ratio: 0.8 — ABNORMAL LOW (ref 1.2–2.2)
Albumin: 3.3 g/dL — ABNORMAL LOW (ref 3.8–4.8)
Alkaline Phosphatase: 153 IU/L — ABNORMAL HIGH (ref 48–121)
BUN/Creatinine Ratio: 30 — ABNORMAL HIGH (ref 12–28)
BUN: 20 mg/dL (ref 8–27)
Bilirubin Total: 2.6 mg/dL — ABNORMAL HIGH (ref 0.0–1.2)
CO2: 20 mmol/L (ref 20–29)
Calcium: 9.2 mg/dL (ref 8.7–10.3)
Chloride: 100 mmol/L (ref 96–106)
Creatinine, Ser: 0.67 mg/dL (ref 0.57–1.00)
GFR calc Af Amer: 107 mL/min/{1.73_m2} (ref >59)
GFR calc non Af Amer: 93 mL/min/{1.73_m2} (ref >59)
Globulin, Total: 4.2 g/dL (ref 1.5–4.5)
Glucose: 116 mg/dL — ABNORMAL HIGH (ref 65–99)
Potassium: 4 mmol/L (ref 3.5–5.2)
Sodium: 136 mmol/L (ref 134–144)
Total Protein: 7.5 g/dL (ref 6.0–8.5)

## 2020-04-18 LAB — PRO B NATRIURETIC PEPTIDE: NT-Pro BNP: 10984 pg/mL — ABNORMAL HIGH (ref 0–287)

## 2020-04-20 ENCOUNTER — Telehealth: Payer: Self-pay | Admitting: Emergency Medicine

## 2020-04-20 MED ORDER — LEVOTHYROXINE SODIUM 50 MCG PO TABS: 50.0000 ug | ORAL_TABLET | Freq: Every day | ORAL | 1 refills | Status: DC

## 2020-04-20 NOTE — Telephone Encounter (Signed)
Breanna Bennett informed her of her lab results and that Dr. Bing Matter wants to start synthroid 50 mcg daily she verbally understands. No further questions.

## 2020-05-14 ENCOUNTER — Ambulatory Visit (INDEPENDENT_AMBULATORY_CARE_PROVIDER_SITE_OTHER): Payer: BLUE CROSS/BLUE SHIELD

## 2020-05-14 ENCOUNTER — Other Ambulatory Visit: Payer: Self-pay

## 2020-05-14 DIAGNOSIS — I428 Other cardiomyopathies: Secondary | ICD-10-CM | POA: Diagnosis not present

## 2020-05-14 LAB — ECHOCARDIOGRAM COMPLETE
Area-P 1/2: 7.16 cm2
Calc EF: 9.1 %
MV M vel: 4.29 m/s
MV Peak grad: 73.6 mmHg
Radius: 0.7 cm
S' Lateral: 6 cm
Single Plane A2C EF: 12.9 %
Single Plane A4C EF: 9 %

## 2020-05-14 NOTE — Progress Notes (Signed)
Complete echocardiogram performed.  Jimmy Emmelina Mcloughlin RDCS, RVT  

## 2020-05-15 ENCOUNTER — Ambulatory Visit (INDEPENDENT_AMBULATORY_CARE_PROVIDER_SITE_OTHER): Payer: BLUE CROSS/BLUE SHIELD

## 2020-05-15 DIAGNOSIS — I639 Cerebral infarction, unspecified: Secondary | ICD-10-CM | POA: Diagnosis not present

## 2020-05-15 DIAGNOSIS — Z5181 Encounter for therapeutic drug level monitoring: Secondary | ICD-10-CM

## 2020-05-15 DIAGNOSIS — Z9229 Personal history of other drug therapy: Secondary | ICD-10-CM

## 2020-05-15 LAB — POCT INR: INR: 3.7 — AB (ref 2.0–3.0)

## 2020-05-15 NOTE — Patient Instructions (Signed)
Hold today and then continue taking 1/2 tablet daily except 1 tablet on Sundays, Tuesdays and Thursdays Be consistent with greens Recheck in 4 weeks

## 2020-05-29 ENCOUNTER — Other Ambulatory Visit: Payer: Self-pay | Admitting: Cardiology

## 2020-05-29 MED ORDER — SPIRONOLACTONE 25 MG PO TABS
12.5000 mg | ORAL_TABLET | Freq: Every day | ORAL | 1 refills | Status: DC
Start: 2020-05-29 — End: 2020-10-30

## 2020-05-29 NOTE — Telephone Encounter (Signed)
*  STAT* If patient is at the pharmacy, call can be transferred to refill team.   1. Which medications need to be refilled? (please list name of each medication and dose if known) spironolactone (ALDACTONE) 25 MG tablet  2. Which pharmacy/location (including street and city if local pharmacy) is medication to be sent to? [id number] - [place], [place] - [address]  3. Do they need a 30 day or 90 day supply? 90 day

## 2020-05-29 NOTE — Telephone Encounter (Signed)
Refill sent in per request.  

## 2020-06-08 ENCOUNTER — Telehealth: Payer: Self-pay | Admitting: Cardiology

## 2020-06-08 NOTE — Telephone Encounter (Signed)
Patient called stating her insurance will not cover her medications anymore, she is in the donut hole. She wants to know what can be done for her.

## 2020-06-11 MED ORDER — CARVEDILOL 6.25 MG PO TABS
ORAL_TABLET | ORAL | 1 refills | Status: DC
Start: 2020-06-11 — End: 2020-10-30

## 2020-06-11 NOTE — Telephone Encounter (Signed)
Pt is returning call.  

## 2020-06-11 NOTE — Telephone Encounter (Signed)
Left message for patient to return call.

## 2020-06-11 NOTE — Telephone Encounter (Signed)
Pt called to report that she was in the "donut hole" and her meds combined are very expensive for her... she goes to [facility]... we spoke about [facility] and about the $4-15 dollar list at [facility] which many of her meds fall under... but she says she will try [facility] first before switching pharmacies and will let us know if she needs any further assistance.

## 2020-06-12 ENCOUNTER — Ambulatory Visit (INDEPENDENT_AMBULATORY_CARE_PROVIDER_SITE_OTHER): Payer: Self-pay

## 2020-06-12 ENCOUNTER — Other Ambulatory Visit: Payer: Self-pay

## 2020-06-12 DIAGNOSIS — Z9229 Personal history of other drug therapy: Secondary | ICD-10-CM

## 2020-06-12 DIAGNOSIS — Z5181 Encounter for therapeutic drug level monitoring: Secondary | ICD-10-CM

## 2020-06-12 DIAGNOSIS — I639 Cerebral infarction, unspecified: Secondary | ICD-10-CM

## 2020-06-12 LAB — POCT INR: INR: 3.2 — AB (ref 2.0–3.0)

## 2020-06-12 NOTE — Patient Instructions (Signed)
continue taking 1/2 tablet daily except 1 tablet on Sundays, Tuesdays and Thursdays Be consistent with greens and eat more the next 3 days Recheck in 4 weeks

## 2020-07-03 DIAGNOSIS — N289 Disorder of kidney and ureter, unspecified: Secondary | ICD-10-CM | POA: Insufficient documentation

## 2020-07-03 DIAGNOSIS — I428 Other cardiomyopathies: Secondary | ICD-10-CM | POA: Insufficient documentation

## 2020-07-03 DIAGNOSIS — K219 Gastro-esophageal reflux disease without esophagitis: Secondary | ICD-10-CM | POA: Insufficient documentation

## 2020-07-03 DIAGNOSIS — I447 Left bundle-branch block, unspecified: Secondary | ICD-10-CM | POA: Insufficient documentation

## 2020-07-03 DIAGNOSIS — I10 Essential (primary) hypertension: Secondary | ICD-10-CM | POA: Insufficient documentation

## 2020-07-03 DIAGNOSIS — D582 Other hemoglobinopathies: Secondary | ICD-10-CM | POA: Insufficient documentation

## 2020-07-03 DIAGNOSIS — I5022 Chronic systolic (congestive) heart failure: Secondary | ICD-10-CM | POA: Insufficient documentation

## 2020-07-03 DIAGNOSIS — I110 Hypertensive heart disease with heart failure: Secondary | ICD-10-CM | POA: Insufficient documentation

## 2020-07-03 DIAGNOSIS — R7303 Prediabetes: Secondary | ICD-10-CM | POA: Insufficient documentation

## 2020-07-03 DIAGNOSIS — I071 Rheumatic tricuspid insufficiency: Secondary | ICD-10-CM | POA: Insufficient documentation

## 2020-07-04 ENCOUNTER — Ambulatory Visit (INDEPENDENT_AMBULATORY_CARE_PROVIDER_SITE_OTHER): Payer: Self-pay | Admitting: Cardiology

## 2020-07-04 ENCOUNTER — Other Ambulatory Visit: Payer: Self-pay

## 2020-07-04 ENCOUNTER — Encounter: Payer: Self-pay | Admitting: Cardiology

## 2020-07-04 VITALS — BP 92/80 | HR 82 | Ht 68.0 in | Wt 160.0 lb

## 2020-07-04 DIAGNOSIS — E782 Mixed hyperlipidemia: Secondary | ICD-10-CM

## 2020-07-04 DIAGNOSIS — I447 Left bundle-branch block, unspecified: Secondary | ICD-10-CM

## 2020-07-04 DIAGNOSIS — I5022 Chronic systolic (congestive) heart failure: Secondary | ICD-10-CM

## 2020-07-04 DIAGNOSIS — I519 Heart disease, unspecified: Secondary | ICD-10-CM

## 2020-07-04 DIAGNOSIS — I340 Nonrheumatic mitral (valve) insufficiency: Secondary | ICD-10-CM

## 2020-07-04 DIAGNOSIS — N289 Disorder of kidney and ureter, unspecified: Secondary | ICD-10-CM

## 2020-07-04 DIAGNOSIS — I428 Other cardiomyopathies: Secondary | ICD-10-CM

## 2020-07-04 DIAGNOSIS — I5021 Acute systolic (congestive) heart failure: Secondary | ICD-10-CM

## 2020-07-04 DIAGNOSIS — I63411 Cerebral infarction due to embolism of right middle cerebral artery: Secondary | ICD-10-CM

## 2020-07-04 HISTORY — DX: Heart disease, unspecified: I51.9

## 2020-07-04 NOTE — Progress Notes (Signed)
Cardiology Office Note:    Date:  07/04/2020   ID:  Breanna Bennett, DOB 08/27/1955, MRN [id number]  PCP:  Laurel Dimmer, FNP  Cardiologist:  Gypsy Balsam, MD    Referring MD: Selena Batten *   Chief Complaint  Patient presents with  . Follow-up  I am doing fine  History of Present Illness:    Breanna Bennett is a 64 y.o. female with nonischemic cardiomyopathy severely diminished left ventricle ejection fraction less than 20%.  She also does have left bundle branch block, history of CVA which required mechanical thrombectomy, dyslipidemia, essential hypertension.  Comes today to also follow-up she is clearly deteriorating.  She is losing weight she is seems to be happy and cheerful and while talking to me but cannot do much.  Her daughter is with her.  She tells me that she barely can walk around but try to move some.  Denies have any chest pain tightness squeezing pressure burning chest.  Past Medical History:  Diagnosis Date  . Acute CVA (cerebrovascular accident) (HCC) 05/30/2019  . Acute respiratory failure (HCC)   . Acute systolic CHF (congestive heart failure) (HCC) 01/12/2019  . Chronic systolic CHF (congestive heart failure) (HCC)   . Class 2 severe obesity due to excess calories with serious comorbidity and body mass index (BMI) of 38.0 to 38.9 in adult (HCC) 10/19/2018  . Cough due to bronchospasm 02/16/2019  . Elevated hemoglobin (HCC)   . Elevated uric acid in blood 06/06/2019  . Essential hypertension 01/12/2019  . Gastroesophageal reflux disease 10/19/2018  . GERD (gastroesophageal reflux disease)   . History of Coumadin therapy 06/16/2019  . History of CVA (cerebrovascular accident) 05/30/2019  . History of gout 12/06/2019  . History of hemiplegia 05/30/2019  . Hospital discharge follow-up 06/24/2019  . Hyperlipidemia   . Hypertension   . Hypokalemia 12/06/2019  . Impaired mobility and ADLs 05/30/2019  . Late effect of cerebrovascular accident  (CVA) 12/12/2019  . LBBB (left bundle branch block)   . Left ventricular ejection fraction less than 20% 05/30/2019  . Middle cerebral artery embolism, 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  . Mild renal insufficiency   . Mitral regurgitation   . NICM (nonischemic cardiomyopathy) (HCC)   . Perennial allergic rhinitis 02/16/2019  . Peripheral edema 12/06/2019  . Pre-diabetes   . Stroke (cerebrum) (HCC) - R MCA infarct s/p mechanical thrombectomy 05/19/2019  . Systolic CHF, acute (HCC) 02/09/2019  . Tricuspid regurgitation   . Type 2 diabetes mellitus with hyperglycemia, without long-term current use of insulin (HCC) 05/30/2019  . Type 2 diabetes mellitus, uncontrolled (HCC) 05/27/2019    Past Surgical History:  Procedure Laterality Date  . IR ANGIO VERTEBRAL SEL SUBCLAVIAN INNOMINATE UNI R MOD SED  05/19/2019  . IR CT HEAD LTD  05/19/2019  . IR PERCUTANEOUS ART THROMBECTOMY/INFUSION INTRACRANIAL INC DIAG ANGIO  05/19/2019  . RADIOLOGY WITH ANESTHESIA N/A 05/19/2019   Procedure: IR WITH ANESTHESIA;  Surgeon: Julieanne Cotton, MD;  Location: [facility] OR;  Service: Radiology;  Laterality: N/A;  . RIGHT/LEFT HEART CATH AND CORONARY ANGIOGRAPHY N/A 01/13/2019   Procedure: RIGHT/LEFT HEART CATH AND CORONARY ANGIOGRAPHY;  Surgeon: Runell Gess, MD;  Location: [facility] INVASIVE CV LAB;  Service: Cardiovascular;  Laterality: N/A;  . TUBAL LIGATION      Current Medications: Current Meds  Medication Sig  . albuterol (VENTOLIN HFA) 108 (90 Base) MCG/ACT inhaler Inhale 2 puffs into the lungs every 4 (four) hours as needed for shortness of breath.  . allopurinol (ZYLOPRIM) 100  MG tablet Take 100 mg by mouth 2 (two) times daily.  Marland Kitchen atorvastatin (LIPITOR) 10 MG tablet Take 10 mg by mouth daily.  . carvedilol (COREG) 6.25 MG tablet TAKE 1 TABLET(6.25 MG) BY MOUTH TWICE DAILY WITH A MEAL  . famotidine (PEPCID) 40 MG tablet Take 40 mg by mouth at bedtime.   . fluticasone (FLONASE) 50 MCG/ACT nasal spray Place 2 sprays into both nostrils  daily as needed for allergies or rhinitis.   . furosemide (LASIX) 40 MG tablet Take 40 mg by mouth daily.  Marland Kitchen levothyroxine (SYNTHROID) 50 MCG tablet Take 1 tablet (50 mcg total) by mouth daily before breakfast.  . loratadine (CLARITIN) 10 MG tablet Take 10 mg by mouth daily as needed for allergies or rhinitis.   Marland Kitchen montelukast (SINGULAIR) 10 MG tablet Take 10 mg by mouth at bedtime.  . potassium chloride (KLOR-CON) 10 MEQ tablet TAKE 1 TABLET(10 MEQ) BY MOUTH DAILY  . spironolactone (ALDACTONE) 25 MG tablet Take 0.5 tablets (12.5 mg total) by mouth daily.  . Vitamin D, Ergocalciferol, (DRISDOL) 1.25 MG (50000 UT) CAPS capsule Take 50,000 Units by mouth every Monday.   . warfarin (COUMADIN) 2.5 MG tablet TAKE 1 TABLET BY MOUTH DAILY EXCEPT 1/2 TABLET ON MONDAY, WEDNESDAY, FRIDAY OR AS DIRECTED     Allergies:   Lisinopril and Losartan   Social History   Socioeconomic History  . Marital status: Unknown    Spouse name: Not on file  . Number of children: Not on file  . Years of education: Not on file  . Highest education level: Not on file  Occupational History  . Not on file  Tobacco Use  . Smoking status: Never Smoker  . Smokeless tobacco: Never Used  Vaping Use  . Vaping Use: Never used  Substance and Sexual Activity  . Alcohol use: Never  . Drug use: Never  . Sexual activity: Not on file  Other Topics Concern  . Not on file  Social History Narrative  . Not on file   Social Determinants of Health   Financial Resource Strain:   . Difficulty of Paying Living Expenses: Not on file  Food Insecurity:   . Worried About Programme researcher, broadcasting/film/video in the Last Year: Not on file  . Ran Out of Food in the Last Year: Not on file  Transportation Needs:   . Lack of Transportation (Medical): Not on file  . Lack of Transportation (Non-Medical): Not on file  Physical Activity:   . Days of Exercise per Week: Not on file  . Minutes of Exercise per Session: Not on file  Stress:   . Feeling of  Stress : Not on file  Social Connections:   . Frequency of Communication with Friends and Family: Not on file  . Frequency of Social Gatherings with Friends and Family: Not on file  . Attends Religious Services: Not on file  . Active Member of Clubs or Organizations: Not on file  . Attends Banker Meetings: Not on file  . Marital Status: Not on file     Family History: The patient's family history includes CAD in her mother; Colon cancer in her father; Heart failure in her mother; Hypertension in her mother. ROS:   Please see the history of present illness.    All 14 point review of systems negative except as described per history of present illness  EKGs/Labs/Other Studies Reviewed:      Recent Labs: 04/17/2020: ALT 7; BUN 20; Creatinine, Ser 0.67;  NT-Pro BNP 10,984; Potassium 4.0; Sodium 136; TSH 3.320  Recent Lipid Panel    Component Value Date/Time   CHOL 96 05/20/2019 0441   TRIG 90 05/20/2019 0441   HDL 15 (L) 05/20/2019 0441   CHOLHDL 6.4 05/20/2019 0441   VLDL 18 05/20/2019 0441   LDLCALC 63 05/20/2019 0441    Physical Exam:    VS:  BP 92/80 (BP Location: Right Arm, Patient Position: Sitting, Cuff Size: Normal)   Pulse 82   Ht 5\' 8"  (1.727 m)   Wt 160 lb (72.6 kg) Comment: verbal  SpO2 98%   BMI 24.33 kg/m     Wt Readings from Last 3 Encounters:  07/04/20 160 lb (72.6 kg)  04/17/20 180 lb (81.6 kg)  07/18/19 233 lb (105.7 kg)     GEN:  Well nourished, well developed in no acute distress HEENT: Normal NECK: JVD elevated both sides LYMPHATICS: No lymphadenopathy CARDIAC: RRR, no murmurs, no rubs, no gallops RESPIRATORY:  Clear to auscultation without rales, wheezing or rhonchi  ABDOMEN: Soft, non-tender, non-distended MUSCULOSKELETAL:  No edema; No deformity  SKIN: Warm and dry LOWER EXTREMITIES: 1+ swelling with some wounds NEUROLOGIC:  Alert and oriented x 3 PSYCHIATRIC:  Normal affect   ASSESSMENT:    1. Acute systolic CHF  (congestive heart failure) (HCC)   2. Mild renal insufficiency   3. Nonischemic cardiomyopathy [facility]) cardiac catheterization May 2020 showed normal coronaries, ejection fraction 30%   4. LBBB (left bundle branch block)   5. Chronic systolic CHF (congestive heart failure) (HCC)   6. Cardiac cachexia   7. Mixed hyperlipidemia   8. Nonrheumatic mitral valve regurgitation   9. Cerebrovascular accident (CVA) due to embolism of right middle cerebral artery (HCC)    PLAN:    In order of problems listed above:  1. Congestive heart failure.  End-stage, New York Heart Association class III/IV, severely reduced left ventricle ejection fraction less than 20%.  Bigger obstacle I have is to using all correct medications because of her low blood pressure blood pressure today 92/80.  She is not on Entresto ARB ACE inhibitor because of hypotension.  She is only on small dose of the blocker.  I did EKG today which showed left bundle branch block and I talked to her about potentially having ICD implanted with BiV pacing she clearly tell me she is not interested in any intervention she think she is doing fine and she does not want any aggressive measures being taken. 2. Left bundle branch block, noted.  Could benefit from BiV pacing but does not want to do it and I think honestly she is pretty close to the point that be not much will be able to do. Cardiac cachexia progressive.  She prefers conservative approach. 4.  Mitral regurgitation secondary to cardiomyopathy and cardiomegaly again patient does not want any aggressive measures   Medication Adjustments/Labs and Tests Ordered: Current medicines are reviewed at length with the patient today.  Concerns regarding medicines are outlined above.  Orders Placed This Encounter  Procedures  . Basic Metabolic Panel (BMET)  . Pro b natriuretic peptide  . EKG 12-Lead   Medication changes: No orders of the defined types were placed in this  encounter.   Signed, June 2020, MD, [facility] 07/04/2020 4:59 PM    [facility]

## 2020-07-04 NOTE — Patient Instructions (Signed)
Medication Instructions:  Your physician recommends that you continue on your current medications as directed. Please refer to the Current Medication list given to you today.  *If you need a refill on your cardiac medications before your next appointment, please call your pharmacy*   Lab Work: BMP  Pro BNP  If you have labs (blood work) drawn today and your tests are completely normal, you will receive your results only by: Marland Kitchen MyChart Message (if you have MyChart) OR . A paper copy in the mail If you have any lab test that is abnormal or we need to change your treatment, we will call you to review the results.   Testing/Procedures:   Follow-Up: At [facility], you and your health needs are our priority.  As part of our continuing mission to provide you with exceptional heart care, we have created designated Provider Care Teams.  These Care Teams include your primary Cardiologist (physician) and Advanced Practice Providers (APPs -  Physician Assistants and Nurse Practitioners) who all work together to provide you with the care you need, when you need it.  We recommend signing up for the patient portal called "MyChart".  Sign up information is provided on this After Visit Summary.  MyChart is used to connect with patients for Virtual Visits (Telemedicine).  Patients are able to view lab/test results, encounter notes, upcoming appointments, etc.  Non-urgent messages can be sent to your provider as well.   To learn more about what you can do with MyChart, go to [email or web address].    Your next appointment:   3 month(s)  The format for your next appointment:   In Person  Provider:   Gypsy Balsam, MD   Other Instructions

## 2020-07-05 LAB — BASIC METABOLIC PANEL
BUN/Creatinine Ratio: 22 (ref 12–28)
BUN: 18 mg/dL (ref 8–27)
CO2: 23 mmol/L (ref 20–29)
Calcium: 8.7 mg/dL (ref 8.7–10.3)
Chloride: 101 mmol/L (ref 96–106)
Creatinine, Ser: 0.81 mg/dL (ref 0.57–1.00)
GFR calc Af Amer: 89 mL/min/{1.73_m2} (ref >59)
GFR calc non Af Amer: 77 mL/min/{1.73_m2} (ref >59)
Glucose: 105 mg/dL — ABNORMAL HIGH (ref 65–99)
Potassium: 4 mmol/L (ref 3.5–5.2)
Sodium: 139 mmol/L (ref 134–144)

## 2020-07-05 LAB — PRO B NATRIURETIC PEPTIDE: NT-Pro BNP: 7165 pg/mL — ABNORMAL HIGH (ref 0–287)

## 2020-07-10 ENCOUNTER — Ambulatory Visit (INDEPENDENT_AMBULATORY_CARE_PROVIDER_SITE_OTHER): Payer: Self-pay

## 2020-07-10 ENCOUNTER — Telehealth: Payer: Self-pay | Admitting: Cardiology

## 2020-07-10 ENCOUNTER — Other Ambulatory Visit: Payer: Self-pay

## 2020-07-10 DIAGNOSIS — I639 Cerebral infarction, unspecified: Secondary | ICD-10-CM

## 2020-07-10 DIAGNOSIS — Z5181 Encounter for therapeutic drug level monitoring: Secondary | ICD-10-CM

## 2020-07-10 DIAGNOSIS — Z9229 Personal history of other drug therapy: Secondary | ICD-10-CM

## 2020-07-10 LAB — POCT INR: INR: 2.6 (ref 2.0–3.0)

## 2020-07-10 NOTE — Patient Instructions (Signed)
continue taking 1/2 tablet daily except 1 tablet on Sundays, Tuesdays and Thursdays Be consistent with greens Recheck in 4 weeks  

## 2020-07-10 NOTE — Telephone Encounter (Signed)
New Message  Pts daughter Patsy Lager is returning call to [facility] for lab results  Call transferred to [facility]

## 2020-08-07 ENCOUNTER — Ambulatory Visit (INDEPENDENT_AMBULATORY_CARE_PROVIDER_SITE_OTHER): Payer: Self-pay

## 2020-08-07 ENCOUNTER — Other Ambulatory Visit: Payer: Self-pay

## 2020-08-07 DIAGNOSIS — Z9229 Personal history of other drug therapy: Secondary | ICD-10-CM

## 2020-08-07 DIAGNOSIS — Z5181 Encounter for therapeutic drug level monitoring: Secondary | ICD-10-CM

## 2020-08-07 DIAGNOSIS — I639 Cerebral infarction, unspecified: Secondary | ICD-10-CM

## 2020-08-07 LAB — POCT INR: INR: 3.8 — AB (ref 2.0–3.0)

## 2020-08-07 NOTE — Patient Instructions (Signed)
Hold today and then continue taking 1/2 tablet daily except 1 tablet on Sundays, Tuesdays and Thursdays Be consistent with greens Recheck in 4 weeks

## 2020-08-08 ENCOUNTER — Ambulatory Visit (INDEPENDENT_AMBULATORY_CARE_PROVIDER_SITE_OTHER): Payer: 59 | Admitting: Nurse Practitioner

## 2020-08-08 ENCOUNTER — Encounter: Payer: Self-pay | Admitting: Nurse Practitioner

## 2020-08-08 VITALS — BP 108/64 | HR 82 | Temp 97.0°F | Ht 67.0 in | Wt 204.0 lb

## 2020-08-08 DIAGNOSIS — Z742 Need for assistance at home and no other household member able to render care: Secondary | ICD-10-CM

## 2020-08-08 DIAGNOSIS — I679 Cerebrovascular disease, unspecified: Secondary | ICD-10-CM

## 2020-08-08 DIAGNOSIS — Z0000 Encounter for general adult medical examination without abnormal findings: Secondary | ICD-10-CM | POA: Diagnosis not present

## 2020-08-08 DIAGNOSIS — G8194 Hemiplegia, unspecified affecting left nondominant side: Secondary | ICD-10-CM

## 2020-08-08 DIAGNOSIS — I428 Other cardiomyopathies: Secondary | ICD-10-CM

## 2020-08-08 DIAGNOSIS — E1121 Type 2 diabetes mellitus with diabetic nephropathy: Secondary | ICD-10-CM | POA: Diagnosis not present

## 2020-08-08 DIAGNOSIS — Z7901 Long term (current) use of anticoagulants: Secondary | ICD-10-CM

## 2020-08-08 DIAGNOSIS — I6930 Unspecified sequelae of cerebral infarction: Secondary | ICD-10-CM

## 2020-08-08 DIAGNOSIS — E0500 Thyrotoxicosis with diffuse goiter without thyrotoxic crisis or storm: Secondary | ICD-10-CM

## 2020-08-08 DIAGNOSIS — I5022 Chronic systolic (congestive) heart failure: Secondary | ICD-10-CM

## 2020-08-08 DIAGNOSIS — Z789 Other specified health status: Secondary | ICD-10-CM

## 2020-08-08 DIAGNOSIS — R601 Generalized edema: Secondary | ICD-10-CM

## 2020-08-08 DIAGNOSIS — Z7409 Other reduced mobility: Secondary | ICD-10-CM

## 2020-08-08 DIAGNOSIS — L89224 Pressure ulcer of left hip, stage 4: Secondary | ICD-10-CM

## 2020-08-08 NOTE — Patient Instructions (Addendum)
We will call you with lab results and wound care referral Continue home medications Monitor weight daily, report any weight gain more than 2 pounds  Continue follow-up with cardiologist [facility] as much as possible Recommend COVID-19 vaccine   Heart Failure, Diagnosis  Heart failure means that your heart is not able to pump blood in the right way. This makes it hard for your body to work well. Heart failure is usually a long-term (chronic) condition. You must take good care of yourself and follow your treatment plan from your doctor. What are the causes? This condition may be caused by:  High blood pressure.  Build up of cholesterol and fat in the arteries.  Heart attack. This injures the heart muscle.  Heart valves that do not open and close properly.  Damage of the heart muscle. This is also called cardiomyopathy.  Lung disease.  Abnormal heart rhythms. What increases the risk? The risk of heart failure goes up as a person ages. This condition is also more likely to develop in people who:  Are overweight.  Are female.  Smoke or chew tobacco.  Abuse alcohol or illegal drugs.  Have taken medicines that can damage the heart.  Have diabetes.  Have abnormal heart rhythms.  Have thyroid problems.  Have low blood counts (anemia). What are the signs or symptoms? Symptoms of this condition include:  Shortness of breath.  Coughing.  Swelling of the feet, ankles, legs, or belly.  Losing weight for no reason.  Trouble breathing.  Waking from sleep because of the need to sit up and get more air.  Rapid heartbeat.  Being very tired.  Feeling dizzy, or feeling like you may pass out (faint).  Having no desire to eat.  Feeling like you may vomit (nauseous).  Peeing (urinating) more at night.  Feeling confused. How is this treated?     This condition may be treated with:  Medicines. These can be given to treat blood pressure and to make the heart  muscles stronger.  Changes in your daily life. These may include eating a healthy diet, staying at a healthy body weight, quitting tobacco and illegal drug use, or doing exercises.  Surgery. Surgery can be done to open blocked valves, or to put devices in the heart, such as pacemakers.  A donor heart (heart transplant). You will receive a healthy heart from a donor. Follow these instructions at home:  Treat other conditions as told by your doctor. These may include high blood pressure, diabetes, thyroid disease, or abnormal heart rhythms.  Learn as much as you can about heart failure.  Get support as you need it.  Keep all follow-up visits as told by your doctor. This is important. Summary  Heart failure means that your heart is not able to pump blood in the right way.  This condition is caused by high blood pressure, heart attack, or damage of the heart muscle.  Symptoms of this condition include shortness of breath and swelling of the feet, ankles, legs, or belly. You may also feel very tired or feel like you may vomit.  You may be treated with medicines, surgery, or changes in your daily life.  Treat other health conditions as told by your doctor. This information is not intended to replace advice given to you by your health care provider. Make sure you discuss any questions you have with your health care provider. Document Revised: 10/29/2018 Document Reviewed: 10/29/2018 Elsevier Patient Education  2020 [employer] Heart Failure Eating  Plan Heart failure, also called congestive heart failure, occurs when your heart does not pump blood well enough to meet your body's needs for oxygen-rich blood. Heart failure is a long-term (chronic) condition. Living with heart failure can be challenging. However, following your health care provider's instructions about a healthy lifestyle and working with a diet and nutrition specialist (dietitian) to choose the right foods may help to improve  your symptoms. What are tips for following this plan? Reading food labels  Check food labels for the amount of sodium per serving. Choose foods that have less than 140 mg (milligrams) of sodium in each serving.  Check food labels for the number of calories per serving. This is important if you need to limit your daily calorie intake to lose weight.  Check food labels for the serving size. If you eat more than one serving, you will be eating more sodium and calories than what is listed on the label.  Look for foods that are labeled as "sodium-free," "very low sodium," or "low sodium." ? Foods labeled as "reduced sodium" or "lightly salted" may still have more sodium than what is recommended for you. Cooking  Avoid adding salt when cooking. Ask your health care provider or dietitian before using salt substitutes.  Season food with salt-free seasonings, spices, or herbs. Check the label of seasoning mixes to make sure they do not contain salt.  Cook with heart-healthy oils, such as olive, canola, soybean, or sunflower oil.  Do not fry foods. Cook foods using low-fat methods, such as baking, boiling, grilling, and broiling.  Limit unhealthy fats when cooking by: ? Removing the skin from poultry, such as chicken. ? Removing all visible fats from meats. ? Skimming the fat off from stews, soups, and gravies before serving them. Meal planning   Limit your intake of: ? Processed, canned, or pre-packaged foods. ? Foods that are high in trans fat, such as fried foods. ? Sweets, desserts, sugary drinks, and other foods with added sugar. ? Full-fat dairy products, such as whole milk.  Eat a balanced diet that includes: ? 4-5 servings of fruit each day and 4-5 servings of vegetables each day. At each meal, try to fill half of your plate with fruits and vegetables. ? Up to 6-8 servings of whole grains each day. ? Up to 2 servings of lean meat, poultry, or fish each day. One serving of meat is  equal to 3 oz. This is about the same size as a deck of cards. ? 2 servings of low-fat dairy each day. ? Heart-healthy fats. Healthy fats called omega-3 fatty acids are found in foods such as flaxseed and cold-water fish like sardines, salmon, and mackerel.  Aim to eat 25-35 g (grams) of fiber a day. Foods that are high in fiber include apples, broccoli, carrots, beans, peas, and whole grains.  Do not add salt or condiments that contain salt (such as soy sauce) to foods before eating.  When eating at a restaurant, ask that your food be prepared with less salt or no salt, if possible.  Try to eat 2 or more vegetarian meals each week.  Eat more home-cooked food and eat less restaurant, buffet, and fast food. General information  Do not eat more than 2,300 mg of salt (sodium) a day. The amount of sodium that is recommended for you may be lower, depending on your condition.  Maintain a healthy body weight as directed. Ask your health care provider what a healthy weight is for you. ?  Check your weight every day. ? Work with your health care provider and dietitian to make a plan that is right for you to lose weight or maintain your current weight.  Limit how much fluid you drink. Ask your health care provider or dietitian how much fluid you can have each day.  Limit or avoid alcohol as told by your health care provider or dietitian. Recommended foods The items listed may not be a complete list. Talk with your dietitian about what dietary choices are best for you. Fruits All fresh, frozen, and canned fruits. Dried fruits, such as raisins, prunes, and cranberries. Vegetables All fresh vegetables. Vegetables that are frozen without sauce or added salt. Low-sodium or sodium-free canned vegetables. Grains Bread with less than 80 mg of sodium per slice. Whole-wheat pasta, quinoa, and brown rice. Oats and oatmeal. Barley. Millet. Grits and cream of wheat. Whole-grain and whole-wheat cold  cereal. Meats and other protein foods Lean cuts of meat. Skinless chicken and [place]. Fish with high omega-3 fatty acids, such as salmon, sardines, and other cold-water fishes. Eggs. Dried beans, peas, and edamame. Unsalted nuts and nut butters. Dairy Low-fat or nonfat (skim) milk and dried milk. Rice milk, soy milk, and almond milk. Low-fat or nonfat yogurt. Small amounts of reduced-sodium block cheese. Low-sodium cottage cheese. Fats and oils Olive, canola, soybean, flaxseed, or sunflower oil. Avocado. Sweets and desserts Apple sauce. Granola bars. Sugar-free pudding and gelatin. Frozen fruit bars. Seasoning and other foods Fresh and dried herbs. Lemon or lime juice. Vinegar. Low-sodium ketchup. Salt-free marinades, salad dressings, sauces, and seasonings. The items listed above may not be a complete list of foods and beverages you can eat. Contact a dietitian for more information. Foods to avoid The items listed may not be a complete list. Talk with your dietitian about what dietary choices are best for you. Fruits Fruits that are dried with sodium-containing preservatives. Vegetables Canned vegetables. Frozen vegetables with sauce or seasonings. Creamed vegetables. [place] fries. Onion rings. Pickled vegetables and sauerkraut. Grains Bread with more than 80 mg of sodium per slice. Hot or cold cereal with more than 140 mg sodium per serving. Salted pretzels and crackers. Pre-packaged breadcrumbs. Bagels, croissants, and biscuits. Meats and other protein foods Ribs and chicken wings. Bacon, ham, pepperoni, bologna, salami, and packaged luncheon meats. Hot dogs, bratwurst, and sausage. Canned meat. Smoked meat and fish. Salted nuts and seeds. Dairy Whole milk, half-and-half, and cream. Buttermilk. Processed cheese, cheese spreads, and cheese curds. Regular cottage cheese. Feta cheese. Shredded cheese. String cheese. Fats and oils Butter, lard, shortening, ghee, and bacon fat. Canned and  packaged gravies. Seasoning and other foods Onion salt, garlic salt, table salt, and sea salt. Marinades. Regular salad dressings. Relishes, pickles, and olives. Meat flavorings and tenderizers, and bouillon cubes. Horseradish, ketchup, and mustard. Worcestershire sauce. Teriyaki sauce, soy sauce (including reduced sodium). Hot sauce and Tabasco sauce. Steak sauce, fish sauce, oyster sauce, and cocktail sauce. Taco seasonings. Barbecue sauce. Tartar sauce. The items listed above may not be a complete list of foods and beverages you should avoid. Contact a dietitian for more information. Summary  A heart failure eating plan includes changes that limit your intake of sodium and unhealthy fat, and it may help you lose weight or maintain a healthy weight. Your health care provider may also recommend limiting how much fluid you drink.  Most people with heart failure should eat no more than 2,300 mg of salt (sodium) a day. The amount of sodium that is recommended  for you may be lower, depending on your condition.  Contact your health care provider or dietitian before making any major changes to your diet. This information is not intended to replace advice given to you by your health care provider. Make sure you discuss any questions you have with your health care provider. Document Revised: 10/07/2018 Document Reviewed: 12/26/2016 Elsevier Patient Education  2020 [employer].  Advance Directive  Advance directives are legal documents that let you make choices ahead of time about your health care and medical treatment in case you become unable to communicate for yourself. Advance directives are a way for you to make known your wishes to family, friends, and health care providers. This can let others know about your end-of-life care if you become unable to communicate. Discussing and writing advance directives should happen over time rather than all at once. Advance directives can be changed depending on  your situation and what you want, even after you have signed the advance directives. There are different types of advance directives, such as:  Medical power of attorney.  Living will.  Do not resuscitate (DNR) or do not attempt resuscitation (DNAR) order. Health care proxy and medical power of attorney A health care proxy is also called a health care agent. This is a person who is appointed to make medical decisions for you in cases where you are unable to make the decisions yourself. Generally, people choose someone they know well and trust to represent their preferences. Make sure to ask this person for an agreement to act as your proxy. A proxy may have to exercise judgment in the event of a medical decision for which your wishes are not known. A medical power of attorney is a legal document that names your health care proxy. Depending on the laws in your state, after the document is written, it may also need to be:  Signed.  Notarized.  Dated.  Copied.  Witnessed.  Incorporated into your medical record. You may also want to appoint someone to manage your money in a situation in which you are unable to do so. This is called a durable power of attorney for finances. It is a separate legal document from the durable power of attorney for health care. You may choose the same person or someone different from your health care proxy to act as your agent in money matters. If you do not appoint a proxy, or if there is a concern that the proxy is not acting in your best interests, a court may appoint a guardian to act on your behalf. Living will A living will is a set of instructions that state your wishes about medical care when you cannot express them yourself. Health care providers should keep a copy of your living will in your medical record. You may want to give a copy to family members or friends. To alert caregivers in case of an emergency, you can place a card in your wallet to let them  know that you have a living will and where they can find it. A living will is used if you become:  Terminally ill.  Disabled.  Unable to communicate or make decisions. Items to consider in your living will include:  To use or not to use life-support equipment, such as dialysis machines and breathing machines (ventilators).  A DNR or DNAR order. This tells health care providers not to use cardiopulmonary resuscitation (CPR) if breathing or heartbeat stops.  To use or not to use tube  feeding.  To be given or not to be given food and fluids.  Comfort (palliative) care when the goal becomes comfort rather than a cure.  Donation of organs and tissues. A living will does not give instructions for distributing your money and property if you should pass away. DNR or DNAR A DNR or DNAR order is a request not to have CPR in the event that your heart stops beating or you stop breathing. If a DNR or DNAR order has not been made and shared, a health care provider will try to help any patient whose heart has stopped or who has stopped breathing. If you plan to have surgery, talk with your health care provider about how your DNR or DNAR order will be followed if problems occur. What if I do not have an advance directive? If you do not have an advance directive, some states assign family decision makers to act on your behalf based on how closely you are related to them. Each state has its own laws about advance directives. You may want to check with your health care provider, attorney, or state representative about the laws in your state. Summary  Advance directives are the legal documents that allow you to make choices ahead of time about your health care and medical treatment in case you become unable to tell others about your care.  The process of discussing and writing advance directives should happen over time. You can change the advance directives, even after you have signed them.  Advance  directives include DNR or DNAR orders, living wills, and designating an agent as your medical power of attorney. This information is not intended to replace advice given to you by your health care provider. Make sure you discuss any questions you have with your health care provider. Document Revised: 03/10/2019 Document Reviewed: 03/10/2019 Elsevier Patient Education  2020 [employer].

## 2020-08-08 NOTE — Progress Notes (Signed)
New Patient Office Visit  Subjective:  Patient ID: Breanna Bennett, female    DOB: Oct 18, 1955  Age: 64 y.o. MRN: [id number]  CC:  Chief Complaint  Patient presents with  . Establish Care       . Hospitalization Follow-up    HPI Breanna Bennett presents for edema in her lower legs. Was seen at [facility] for acute exacerbation of CHF 11/25 through 11/29. Pt has severe nonischemic cardiomopathy with EF < 20%. She sees Dr. Dewayne Shorter, who saw her last on 07/04/2020. Her BNP was 10000+ and she was diuresed with lasix. Patient is Lasix 20 mg and and takes her second dose at lunchtime if her systolic blood pressures above 90. She is intolerant to ACE inhibitors and ARBs which caused cough. On review of Dr. Vanetta Shawl note it appears he is recommended a defibrillator however the patient has refused. She is unable to be  started on Entresto due to her low blood pressure. Patient is on spironolactone 25 mg half a pill daily. Pt reports not adding any salt to her diet and elevating her legs as much as possible.   The patient is concerned about her wounds and has had trouble getting home health care due to her insurance. She recently changed to [facility] and thinks that this will cover home health care. She has large chronic venous ulcers on her legs which per the patient and her daughter have been present for one year but have been worsening recently. She is agreeable to going to the wound care center.  Patient has diabetes which is well controlled. Her last A1c was done at [facility] was 5.9. Per her daughter she used to be on glipizide however this was discontinued. She is requesting a freestyle libre however she does not qualify.  Patient has a history of a embolic stroke on 05/18/20 of her middle cerebral artery which has resulted in left hemiparesis.She underwent intracranial thrombectomy while hospitalized. She went in inpatient rehab after discharge from hospital. She spends most of her time in a wheelchair. She has  help at home. She lives with her husband, children, and grandchildren. Patient is currently on Coumadin which is managed by Dr. Bing Matter. INR on 08/07/20 was 3.8 per chart review. Pt was instructed to hold Coumadin yesterday and resume 1.25 mg today.  Hypothyroidism: Patient has significant proptosis of her eyes.. Currently on levothyroxine 50 mcg daily  Hyperlipidemia: Currently on Lipitor 10 mg once daily. Last available lipid panel from 05/20/19 hospitalization revealed TC 96, Trig 90, HDL 15, LDL 63. She has not fasted today.   Past Medical History:  Diagnosis Date  . Acute CVA (cerebrovascular accident) (HCC) 05/30/2019  . Acute respiratory failure (HCC)   . Acute systolic CHF (congestive heart failure) (HCC) 01/12/2019  . Chronic systolic CHF (congestive heart failure) (HCC)   .  Class 2 severe obesity due to excess calories with serious comorbidity and body mass index (BMI) of 38.0 to 38.9 in adult (HCC) 10/19/2018  . Cough due to bronchospasm 02/16/2019  . Elevated hemoglobin (HCC)   . Elevated uric acid in blood 06/06/2019  . Essential hypertension 01/12/2019  . Gastroesophageal reflux disease 10/19/2018  . GERD (gastroesophageal reflux disease)   . History of Coumadin therapy 06/16/2019  . History of CVA (cerebrovascular accident) 05/30/2019  . History of gout 12/06/2019  . History of hemiplegia 05/30/2019  . Hospital discharge follow-up 06/24/2019  . Hyperlipidemia   . Hypertension   . Hypokalemia 12/06/2019  . Impaired mobility  and ADLs 05/30/2019  . Late effect of cerebrovascular accident (CVA) 12/12/2019  . LBBB (left bundle branch block)   . Left ventricular ejection fraction less than 20% 05/30/2019  . Middle cerebral artery embolism, 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  . Mild renal insufficiency   . Mitral regurgitation   . NICM (nonischemic cardiomyopathy) (HCC)   . Perennial allergic rhinitis 02/16/2019  . Peripheral edema 12/06/2019  . Pre-diabetes   . Stroke (cerebrum) (HCC) - R MCA infarct s/p mechanical thrombectomy 05/19/2019  . Systolic CHF, acute (HCC) 02/09/2019  . Tricuspid regurgitation   . Type 2 diabetes mellitus with hyperglycemia, without long-term current use of insulin (HCC) 05/30/2019  . Type 2 diabetes mellitus, uncontrolled (HCC) 05/27/2019    Past Surgical History:  Procedure Laterality Date  . IR ANGIO VERTEBRAL SEL SUBCLAVIAN INNOMINATE UNI R MOD SED  05/19/2019  . IR CT HEAD LTD  05/19/2019  . IR PERCUTANEOUS ART THROMBECTOMY/INFUSION INTRACRANIAL INC DIAG ANGIO  05/19/2019  . RADIOLOGY WITH ANESTHESIA N/A 05/19/2019   Procedure: IR WITH ANESTHESIA;  Surgeon: Julieanne Cotton, MD;  Location: [facility] OR;  Service: Radiology;  Laterality: N/A;  . RIGHT/LEFT HEART CATH AND CORONARY ANGIOGRAPHY N/A 01/13/2019   Procedure: RIGHT/LEFT HEART CATH AND CORONARY  ANGIOGRAPHY;  Surgeon: Runell Gess, MD;  Location: [facility] INVASIVE CV LAB;  Service: Cardiovascular;  Laterality: N/A;  . TUBAL LIGATION      Family History  Problem Relation Age of Onset  . CAD Mother   . Heart failure Mother   . Hypertension Mother   . Colon cancer Father   . Cancer Brother     Social History   Socioeconomic History  . Marital status: Unknown    Spouse name: Not on file  . Number of children: Not on file  . Years of education: Not on file  . Highest education level: Not on file  Occupational History  . Not on file  Tobacco Use  . Smoking status: Never Smoker  . Smokeless tobacco: Never Used  Vaping Use  . Vaping Use: Never used  Substance and Sexual Activity  . Alcohol use: Never  . Drug use: Never  . Sexual activity: Not on file  Other Topics Concern  . Not on file  Social History Narrative  . Not on file   Social Determinants of Health   Financial Resource Strain: Not on file  Food Insecurity: Not on file  Transportation Needs: Not on file  Physical Activity: Not on file  Stress: Not on file  Social Connections: Not on file  Intimate Partner Violence: Not on file    ROS Review of Systems  Constitutional: Negative for chills, fatigue and fever.  HENT: Positive for rhinorrhea. Negative for congestion, ear pain and sore throat.   Respiratory: Positive for cough. Negative for shortness of breath.   Cardiovascular: Positive for leg swelling. Negative for chest pain.  Gastrointestinal: Negative for abdominal pain, constipation, diarrhea, nausea and vomiting.  Genitourinary: Negative for dysuria and urgency.  Musculoskeletal: Negative for back pain and myalgias.  Allergic/Immunologic: Positive for environmental allergies.  Neurological: Negative for dizziness, weakness, light-headedness and headaches.  Psychiatric/Behavioral: Negative for dysphoric mood. The patient is not nervous/anxious.     Objective:   Today's Vitals: BP 108/64    Pulse 82   Temp (!) 97 F (36.1 C)   Ht 5\' 7"  (1.702 m)   Wt 204 lb (92.5 kg)   SpO2 100%   BMI 31.95 kg/m   Physical Exam Vitals reviewed.  Constitutional:  Appearance: Normal appearance.  HENT:     Head: Normocephalic.     Mouth/Throat:     Mouth: Mucous membranes are dry.  Eyes:     Comments: Bilateral proptosis noted  Cardiovascular:     Rate and Rhythm: Normal rate and regular rhythm.     Comments: Distant heart sounds noted. Bilateral pedal pulses difficult to palpate Pulmonary:     Effort: Pulmonary effort is normal.     Breath sounds: Rales present.  Abdominal:     General: Bowel sounds are normal.     Palpations: Abdomen is soft.  Musculoskeletal:     Right lower leg: Edema present.     Left lower leg: Edema present.     Comments: Wheelchair bound. Pt able to weight bear and briefly x 2 assists  Skin:    Capillary Refill: Capillary refill takes more than 3 seconds.     Findings: Erythema (bilateral lower extremities) present.     Comments: Bilateral lower extremities:Skin dry, shriveled, and erythematous. Anasarca weeping edema, moderate yellow serous fluid from bilateral heels and wounds listed below. Lt anterior thigh 3 x 2 cm open wound 2 cm deep Lt shin 2 x 3.5 cm Rt shin Superior 2 x 1 cm Rt shin mid 3 x 3 cm open wound 1 cm deep Rt shin inferior 3 x 2 cm open wound 0.5 cm deep    Neurological:     General: No focal deficit present.     Mental Status: She is alert and oriented to person, place, and time.     Motor: Weakness present.  Psychiatric:        Mood and Affect: Mood normal.        Behavior: Behavior normal.     Assessment & Plan:   1. Physical exam, routine  2. Nonischemic cardiomyopathy [facility]) cardiac catheterization May 2020 showed normal coronaries, ejection fraction < 20% -Continue medications -Keep follow-up appointment with cardiology  3. Chronic systolic CHF (congestive heart failure) (HCC) - CBC with Differential - Brain  natriuretic peptide - Comprehensive metabolic panel -Daily weights  4. Diabetic glomerulopathy (HCC) - Hemoglobin A1c  5. Pressure injury of left thigh, stage 4 (HCC) - AMB referral to wound care center  6. Graves disease - TSH  7. Hemiparesis of left nondominant side due to cerebrovascular disease (HCC)  8. Current use of long term anticoagulation -Follow up with coagulation clinic as scheduled  9. History of CVA with residual deficit -Continue Coumadin as directed  10. Impaired mobility and ADLs -Home health referral   11. Anasarca Continue Lasix 20 mg BID       We will call you with lab results and wound care referral Continue home medications Monitor weight daily, report any weight gain more than 2 pounds  Continue follow-up with cardiologist Elevate legs as much as possible Recommend COVID-19 vaccine   Outpatient Encounter Medications as of 08/08/2020  Medication Sig  . albuterol (VENTOLIN HFA) 108 (90 Base) MCG/ACT inhaler Inhale 2 puffs into the lungs every 4 (four) hours as needed for shortness of breath.  . allopurinol (ZYLOPRIM) 100 MG tablet Take 100 mg by mouth 2 (two) times daily.  Marland Kitchen atorvastatin (LIPITOR) 10 MG tablet Take 10 mg by mouth daily.  . carvedilol (COREG) 6.25 MG tablet TAKE 1 TABLET(6.25 MG) BY MOUTH TWICE DAILY WITH A MEAL  . famotidine (PEPCID) 40 MG tablet Take 40 mg by mouth at bedtime.   . fluticasone (FLONASE) 50 MCG/ACT nasal spray Place 2 sprays  into both nostrils daily as needed for allergies or rhinitis.   . furosemide (LASIX) 40 MG tablet Take 40 mg by mouth daily.  Marland Kitchen levothyroxine (SYNTHROID) 50 MCG tablet Take 1 tablet (50 mcg total) by mouth daily before breakfast.  . loratadine (CLARITIN) 10 MG tablet Take 10 mg by mouth daily as needed for allergies or rhinitis.   Marland Kitchen montelukast (SINGULAIR) 10 MG tablet Take 10 mg by mouth at bedtime.  . potassium chloride (KLOR-CON) 10 MEQ tablet TAKE 1 TABLET(10 MEQ) BY MOUTH DAILY  .  spironolactone (ALDACTONE) 25 MG tablet Take 0.5 tablets (12.5 mg total) by mouth daily.  . Vitamin D, Ergocalciferol, (DRISDOL) 1.25 MG (50000 UT) CAPS capsule Take 50,000 Units by mouth every Monday.   . warfarin (COUMADIN) 2.5 MG tablet TAKE 1 TABLET BY MOUTH DAILY EXCEPT 1/2 TABLET ON MONDAY, WEDNESDAY, FRIDAY OR AS DIRECTED   No facility-administered encounter medications on file as of 08/08/2020.    Follow-up: 4-weeks with Dr Sedalia Muta  Flonnie Hailstone, DNP

## 2020-08-09 LAB — CBC WITH DIFFERENTIAL/PLATELET
Basophils Absolute: 0.1 10*3/uL (ref 0.0–0.2)
Basos: 1 %
EOS (ABSOLUTE): 0.2 10*3/uL (ref 0.0–0.4)
Eos: 3 %
Hematocrit: 38.5 % (ref 34.0–46.6)
Hemoglobin: 12.5 g/dL (ref 11.1–15.9)
Immature Grans (Abs): 0.1 10*3/uL (ref 0.0–0.1)
Immature Granulocytes: 1 %
Lymphocytes Absolute: 1.4 10*3/uL (ref 0.7–3.1)
Lymphs: 23 %
MCH: 27.6 pg (ref 26.6–33.0)
MCHC: 32.5 g/dL (ref 31.5–35.7)
MCV: 85 fL (ref 79–97)
Monocytes Absolute: 0.5 10*3/uL (ref 0.1–0.9)
Monocytes: 9 %
Neutrophils Absolute: 3.7 10*3/uL (ref 1.4–7.0)
Neutrophils: 63 %
Platelets: 316 10*3/uL (ref 150–450)
RBC: 4.53 x10E6/uL (ref 3.77–5.28)
RDW: 14.4 % (ref 11.7–15.4)
WBC: 5.9 10*3/uL (ref 3.4–10.8)

## 2020-08-09 LAB — COMPREHENSIVE METABOLIC PANEL
ALT: 6 IU/L (ref 0–32)
AST: 9 IU/L (ref 0–40)
Albumin/Globulin Ratio: 0.6 — ABNORMAL LOW (ref 1.2–2.2)
Albumin: 2.7 g/dL — ABNORMAL LOW (ref 3.8–4.8)
Alkaline Phosphatase: 172 IU/L — ABNORMAL HIGH (ref 44–121)
BUN/Creatinine Ratio: 24 (ref 12–28)
BUN: 21 mg/dL (ref 8–27)
Bilirubin Total: 1.4 mg/dL — ABNORMAL HIGH (ref 0.0–1.2)
CO2: 24 mmol/L (ref 20–29)
Calcium: 8.4 mg/dL — ABNORMAL LOW (ref 8.7–10.3)
Chloride: 105 mmol/L (ref 96–106)
Creatinine, Ser: 0.87 mg/dL (ref 0.57–1.00)
GFR calc Af Amer: 81 mL/min/{1.73_m2} (ref >59)
GFR calc non Af Amer: 71 mL/min/{1.73_m2} (ref >59)
Globulin, Total: 4.6 g/dL — ABNORMAL HIGH (ref 1.5–4.5)
Glucose: 96 mg/dL (ref 65–99)
Potassium: 4.5 mmol/L (ref 3.5–5.2)
Sodium: 140 mmol/L (ref 134–144)
Total Protein: 7.3 g/dL (ref 6.0–8.5)

## 2020-08-09 LAB — HEMOGLOBIN A1C
Est. average glucose Bld gHb Est-mCnc: 151 mg/dL
Hgb A1c MFr Bld: 6.9 % — ABNORMAL HIGH (ref 4.8–5.6)

## 2020-08-09 LAB — BRAIN NATRIURETIC PEPTIDE: BNP: 2850.2 pg/mL — ABNORMAL HIGH (ref 0.0–100.0)

## 2020-08-09 LAB — TSH: TSH: 2.96 u[IU]/mL (ref 0.450–4.500)

## 2020-08-20 ENCOUNTER — Other Ambulatory Visit: Payer: Self-pay | Admitting: Cardiology

## 2020-08-20 DIAGNOSIS — Z9229 Personal history of other drug therapy: Secondary | ICD-10-CM

## 2020-09-04 ENCOUNTER — Ambulatory Visit (INDEPENDENT_AMBULATORY_CARE_PROVIDER_SITE_OTHER): Payer: 59

## 2020-09-04 ENCOUNTER — Other Ambulatory Visit: Payer: Self-pay

## 2020-09-04 DIAGNOSIS — Z5181 Encounter for therapeutic drug level monitoring: Secondary | ICD-10-CM

## 2020-09-04 DIAGNOSIS — I639 Cerebral infarction, unspecified: Secondary | ICD-10-CM

## 2020-09-04 DIAGNOSIS — Z9229 Personal history of other drug therapy: Secondary | ICD-10-CM | POA: Diagnosis not present

## 2020-09-04 LAB — POCT INR: INR: 3.1 — AB (ref 2.0–3.0)

## 2020-09-04 NOTE — Patient Instructions (Signed)
continue taking 1/2 tablet daily except 1 tablet on Sundays, Tuesdays and Thursdays Be consistent with greens Recheck in 4 weeks

## 2020-09-10 ENCOUNTER — Ambulatory Visit: Payer: 59 | Admitting: Family Medicine

## 2020-09-12 ENCOUNTER — Other Ambulatory Visit: Payer: Self-pay

## 2020-09-12 ENCOUNTER — Ambulatory Visit (INDEPENDENT_AMBULATORY_CARE_PROVIDER_SITE_OTHER): Payer: 59 | Admitting: Family Medicine

## 2020-09-12 VITALS — BP 110/70 | HR 72 | Temp 96.8°F | Resp 18 | Ht 67.0 in | Wt 200.0 lb

## 2020-09-12 DIAGNOSIS — I679 Cerebrovascular disease, unspecified: Secondary | ICD-10-CM | POA: Diagnosis not present

## 2020-09-12 DIAGNOSIS — I428 Other cardiomyopathies: Secondary | ICD-10-CM

## 2020-09-12 DIAGNOSIS — G8194 Hemiplegia, unspecified affecting left nondominant side: Secondary | ICD-10-CM

## 2020-09-12 DIAGNOSIS — E1121 Type 2 diabetes mellitus with diabetic nephropathy: Secondary | ICD-10-CM

## 2020-09-12 DIAGNOSIS — I5022 Chronic systolic (congestive) heart failure: Secondary | ICD-10-CM

## 2020-09-12 DIAGNOSIS — E782 Mixed hyperlipidemia: Secondary | ICD-10-CM

## 2020-09-12 DIAGNOSIS — L98492 Non-pressure chronic ulcer of skin of other sites with fat layer exposed: Secondary | ICD-10-CM

## 2020-09-12 MED ORDER — FLUTICASONE PROPIONATE 50 MCG/ACT NA SUSP: 2.0000 | Freq: Every day | NASAL | 3 refills | Status: DC | PRN

## 2020-09-12 MED ORDER — ALBUTEROL SULFATE HFA 108 (90 BASE) MCG/ACT IN AERS: 2.0000 | INHALATION_SPRAY | RESPIRATORY_TRACT | 3 refills | Status: DC | PRN

## 2020-09-12 MED ORDER — LEVOTHYROXINE SODIUM 50 MCG PO TABS: 50.0000 ug | ORAL_TABLET | Freq: Every day | ORAL | 1 refills | Status: DC

## 2020-09-12 NOTE — Progress Notes (Signed)
Subjective:  Patient ID: Breanna Bennett, female    DOB: [birth year]  Age: 65 y.o. MRN: [id number]  Chief Complaint  Patient presents with  . Diabetes  . Hyperlipidemia    HPI  Breanna Bennett presents for follow-up after establishing 1 month ago.  Patient has significant systolic and stage 1 diastolic congestive heart failure with lower extremity edema.  This has improved.  She also had ulcerations of her lower extremities which are also improving.  She was supposed to go to wound care and [place] at [facility] however there was some confusion and they have not done this yet.  Patient has severe nonischemic cardiomyopathy with ejection fraction less than 20% and refuses to have defibrillator placed.  She is currently on spironolactone and Lasix.  She has instructions from Dr. Bing Matter on how to manage her Lasix based on her blood pressure as she has quite low normal blood pressure.  Diabetes is well controlled.  A1c was 5.9 when last checked within the last 3 months.  She is currently on no medications for her diabetes.  Patient has left hemiparesis secondary to an embolic stroke she had in September 2021.  She underwent an intracranial thrombectomy at that time and was in inpatient rehab.  She is currently on Coumadin which is also managed by Dr. Bing Matter.  Patient is currently on Coumadin which is managed by Dr. Bing Matter.   Hyperlipidemia.  Currently on Lipitor 10 mg once daily.  She is due for lipid panel today.  Her last cholesterol panel was well controlled in September 2020.  At least this is the only when I am able to locate.  Hypothyroidism: Currently on levothyroxine 50 mcg daily  Current Outpatient Medications on File Prior to Visit  Medication Sig Dispense Refill  . allopurinol (ZYLOPRIM) 100 MG tablet Take 100 mg by mouth 2 (two) times daily.    Marland Kitchen atorvastatin (LIPITOR) 10 MG tablet Take 10 mg by mouth daily.    . carvedilol (COREG) 6.25 MG tablet TAKE 1  TABLET(6.25 MG) BY MOUTH TWICE DAILY WITH A MEAL 180 tablet 1  . famotidine (PEPCID) 40 MG tablet Take 40 mg by mouth at bedtime.     . furosemide (LASIX) 40 MG tablet Take 40 mg by mouth daily.    Marland Kitchen loratadine (CLARITIN) 10 MG tablet Take 10 mg by mouth daily as needed for allergies or rhinitis.     Marland Kitchen montelukast (SINGULAIR) 10 MG tablet Take 10 mg by mouth at bedtime.    . potassium chloride (KLOR-CON) 10 MEQ tablet TAKE 1 TABLET(10 MEQ) BY MOUTH DAILY 90 tablet 1  . spironolactone (ALDACTONE) 25 MG tablet Take 0.5 tablets (12.5 mg total) by mouth daily. 45 tablet 1  . Vitamin D, Ergocalciferol, (DRISDOL) 1.25 MG (50000 UT) CAPS capsule Take 50,000 Units by mouth every Monday.     . warfarin (COUMADIN) 2.5 MG tablet TAKE 1 TABLET BY MOUTH DAILY EXCEPT 1/2 TABLET ON MONDAY, WEDNESDAY, FRIDAY OR AS DIRECTED 100 tablet 0   No current facility-administered medications on file prior to visit.   Past Medical History:  Diagnosis Date  . Acute CVA (cerebrovascular accident) (HCC) 05/30/2019  . Acute respiratory failure (HCC)   . Acute systolic CHF (congestive heart failure) (HCC) 01/12/2019  . Chronic systolic CHF (congestive heart failure) (HCC)   . Class 2 severe obesity due to excess calories with serious comorbidity and body mass index (BMI) of 38.0 to 38.9 in adult (HCC) 10/19/2018  . Cough due to bronchospasm  02/16/2019  . Elevated hemoglobin (HCC)   . Elevated uric acid in blood 06/06/2019  . Essential hypertension 01/12/2019  . Gastroesophageal reflux disease 10/19/2018  . GERD (gastroesophageal reflux disease)   . History of Coumadin therapy 06/16/2019  . History of CVA (cerebrovascular accident) 05/30/2019  . History of gout 12/06/2019  . History of hemiplegia 05/30/2019  . Hospital discharge follow-up 06/24/2019  . Hyperlipidemia   . Hypertension   . Hypokalemia 12/06/2019  . Impaired mobility and ADLs 05/30/2019  . Late effect of cerebrovascular accident (CVA) 12/12/2019  . LBBB (left  bundle branch block)   . Left ventricular ejection fraction less than 20% 05/30/2019  . Middle cerebral artery embolism, 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  . Mild renal insufficiency   . Mitral regurgitation   . NICM (nonischemic cardiomyopathy) (HCC)   . Perennial allergic rhinitis 02/16/2019  . Peripheral edema 12/06/2019  . Pre-diabetes   . Stroke (cerebrum) (HCC) - R MCA infarct s/p mechanical thrombectomy 05/19/2019  . Systolic CHF, acute (HCC) 02/09/2019  . Tricuspid regurgitation   . Type 2 diabetes mellitus with hyperglycemia, without long-term current use of insulin (HCC) 05/30/2019  . Type 2 diabetes mellitus, uncontrolled (HCC) 05/27/2019   Past Surgical History:  Procedure Laterality Date  . IR ANGIO VERTEBRAL SEL SUBCLAVIAN INNOMINATE UNI R MOD SED  05/19/2019  . IR CT HEAD LTD  05/19/2019  . IR PERCUTANEOUS ART THROMBECTOMY/INFUSION INTRACRANIAL INC DIAG ANGIO  05/19/2019  . RADIOLOGY WITH ANESTHESIA N/A 05/19/2019   Procedure: IR WITH ANESTHESIA;  Surgeon: Julieanne Cotton, MD;  Location: [facility] OR;  Service: Radiology;  Laterality: N/A;  . RIGHT/LEFT HEART CATH AND CORONARY ANGIOGRAPHY N/A 01/13/2019   Procedure: RIGHT/LEFT HEART CATH AND CORONARY ANGIOGRAPHY;  Surgeon: Runell Gess, MD;  Location: [facility] INVASIVE CV LAB;  Service: Cardiovascular;  Laterality: N/A;  . TUBAL LIGATION      Family History  Problem Relation Age of Onset  . CAD Mother   . Heart failure Mother   . Hypertension Mother   . Colon cancer Father   . Cancer Brother    Social History   Socioeconomic History  . Marital status: Unknown    Spouse name: Not on file  . Number of children: Not on file  . Years of education: Not on file  . Highest education level: Not on file  Occupational History  . Not on file  Tobacco Use  . Smoking status: Never Smoker  . Smokeless tobacco: Never Used  Vaping Use  . Vaping Use: Never used  Substance and Sexual Activity  . Alcohol use: Never  . Drug use: Never  . Sexual activity:  Not on file  Other Topics Concern  . Not on file  Social History Narrative  . Not on file   Social Determinants of Health   Financial Resource Strain: Not on file  Food Insecurity: Not on file  Transportation Needs: Not on file  Physical Activity: Not on file  Stress: Not on file  Social Connections: Not on file    Review of Systems  Constitutional: Negative for chills, fatigue and fever.  HENT: Positive for congestion and rhinorrhea. Negative for sore throat.   Respiratory: Positive for cough. Negative for shortness of breath.   Cardiovascular: Negative for chest pain and palpitations.  Gastrointestinal: Negative for abdominal pain, constipation, diarrhea, nausea and vomiting.  Endocrine: Positive for polydipsia and polyphagia.  Genitourinary: Negative for dysuria and urgency.  Musculoskeletal: Negative for back pain and myalgias.  Neurological: Positive for weakness.  Negative for dizziness, light-headedness and headaches.  Psychiatric/Behavioral: Negative for dysphoric mood. The patient is not nervous/anxious.      Objective:  BP 110/70   Pulse 72   Temp (!) 96.8 F (36 C)   Resp 18   Ht 5\' 7"  (1.702 m)   Wt 200 lb (90.7 kg)   BMI 31.32 kg/m   BP/Weight 09/12/2020 08/08/2020 07/04/2020  Systolic BP 110 108 92  Diastolic BP 70 64 80  Wt. (Lbs) 200 204 160  BMI 31.32 31.95 24.33    Physical Exam Vitals reviewed.  Constitutional:      Appearance: Normal appearance.  Cardiovascular:     Rate and Rhythm: Normal rate and regular rhythm.     Pulses: Normal pulses.     Heart sounds: Normal heart sounds.  Pulmonary:     Effort: Pulmonary effort is normal. No respiratory distress.     Breath sounds: Normal breath sounds.  Abdominal:     General: Abdomen is flat. Bowel sounds are normal.     Palpations: Abdomen is soft.     Tenderness: There is no abdominal tenderness.  Skin:    Findings: Lesion present.  Neurological:     Mental Status: She is alert and  oriented to person, place, and time.  Psychiatric:        Mood and Affect: Mood normal.        Behavior: Behavior normal.   Bilateral lower extremities: Dry skin, stasis dermatitis present bilaterally.  Edema improved.  No weeping.  Patient has 3 wounds on her right shin and 2 on left leg and one on anterior medial thigh and one on left anterior shin.  All appear to be healing and decreased in size from previous visit.  I did not measure today.  The measurements from her first visit 4 weeks ago are as stated below. Lt anterior thigh 3 x 2 cm open wound 2 cm deep Lt shin 2 x 3.5 cm Rt shin Superior 2 x 1 cm Rt shin mid 3 x 3 cm open wound 1 cm deep Rt shin inferior 3 x 2 cm open wound 0.5 cm deep   Lab Results  Component Value Date   WBC 5.9 08/08/2020   HGB 12.5 08/08/2020   HCT 38.5 08/08/2020   PLT 316 08/08/2020   GLUCOSE 96 08/08/2020   CHOL 68 (L) 09/12/2020   TRIG 52 09/12/2020   HDL 17 (L) 09/12/2020   LDLCALC 38 09/12/2020   ALT 6 08/08/2020   AST 9 08/08/2020   NA 140 08/08/2020   K 4.5 08/08/2020   CL 105 08/08/2020   CREATININE 0.87 08/08/2020   BUN 21 08/08/2020   CO2 24 08/08/2020   TSH 2.960 08/08/2020   INR 3.1 (A) 09/04/2020   HGBA1C 6.9 (H) 08/08/2020      Assessment & Plan:   1. Nonischemic cardiomyopathy (HCC) The current medical regimen is effective;  continue present plan and medications. Management per specialist, Dr. 08/10/2020.  2. Diabetic glomerulopathy (HCC) Diabetes well controlled.  - Ambulatory referral to Home Health  3. Chronic systolic CHF (congestive heart failure) (HCC) The current medical regimen is effective;  continue present plan and medications. Management per specialist, Dr. Dewayne Shorter. - Ambulatory referral to Home Health  4. Hemiparesis of left nondominant side due to cerebrovascular disease (HCC) Per family pt walks. Recommend evaluation by PT through home health care.  - Ambulatory referral to Home Health  5. Stage 3  skin ulcer with fat layer exposed (  HCC) x FIVE.  Nonstick gauze with antibacterial ointment applied and covered with curlex and wrapped with coban x 4. Upper left leg: nonstick gauze and antibiotic ointment.  Requesting honeymed, but I cannot seem to order this. I will request home health care use.  - Ambulatory referral to Home Health: ordered wound care through home health care.   6. Mixed hyperlipidemia Continue atorvastatin.  HDL very low. Recommend work on increasing activity with PT.  - Lipid panel    Meds ordered this encounter  Medications  . albuterol (VENTOLIN HFA) 108 (90 Base) MCG/ACT inhaler    Sig: Inhale 2 puffs into the lungs every 4 (four) hours as needed for shortness of breath.    Dispense:  1 each    Refill:  3  . levothyroxine (SYNTHROID) 50 MCG tablet    Sig: Take 1 tablet (50 mcg total) by mouth daily before breakfast.    Dispense:  90 tablet    Refill:  1  . fluticasone (FLONASE) 50 MCG/ACT nasal spray    Sig: Place 2 sprays into both nostrils daily as needed for allergies or rhinitis.    Dispense:  18.2 mL    Refill:  3    Orders Placed This Encounter  Procedures  . Lipid panel  . Cardiovascular Risk Assessment  . Ambulatory referral to Home Health     Follow-up: Return in about 3 months (around 12/11/2020) for fasting.  An After Visit Summary was printed and given to the patient.  Blane Ohara, MD [facility] [phone]

## 2020-09-13 LAB — LIPID PANEL
Chol/HDL Ratio: 4 ratio (ref 0.0–4.4)
Cholesterol, Total: 68 mg/dL — ABNORMAL LOW (ref 100–199)
HDL: 17 mg/dL — ABNORMAL LOW (ref >39)
LDL Chol Calc (NIH): 38 mg/dL (ref 0–99)
Triglycerides: 52 mg/dL (ref 0–149)
VLDL Cholesterol Cal: 13 mg/dL (ref 5–40)

## 2020-09-13 LAB — CARDIOVASCULAR RISK ASSESSMENT

## 2020-09-16 ENCOUNTER — Encounter: Payer: Self-pay | Admitting: Family Medicine

## 2020-09-25 ENCOUNTER — Telehealth: Payer: Self-pay

## 2020-09-25 NOTE — Telephone Encounter (Signed)
Breanna Bennett was called about her Home Health referral.  Dr. Sedalia Muta advised that she follow thru with her referral to wound care since she cannot afford Home Health.  She has an appointment on Feb. 10 @ 12:45 at the [facility].

## 2020-09-25 NOTE — Telephone Encounter (Signed)
Kim from [facility] called to report that [place] declined the referral since she has not met her out of pocket $1500.00 deductible.  Once she meets the deductible cost she is going to be required to pay 20% and the patient reports that she is unable to pay this amount.

## 2020-09-26 ENCOUNTER — Telehealth: Payer: Self-pay

## 2020-09-26 NOTE — Telephone Encounter (Signed)
Dr Cox/Breanna Bennett pt called asking for a referral to [facility] per her insurance phone [phone]. I didn't see any notes on this, this ok to order?

## 2020-09-26 NOTE — Telephone Encounter (Signed)
Please check with patient why she wishes to be referred. The only thing I see is she has a history of stroke and hemiparesis. This is fine to refer her for this, but if there is something else, let me know. Thanks. Dr. Sedalia Muta

## 2020-09-27 ENCOUNTER — Telehealth: Payer: Self-pay

## 2020-09-27 DIAGNOSIS — Z8669 Personal history of other diseases of the nervous system and sense organs: Secondary | ICD-10-CM

## 2020-09-27 NOTE — Telephone Encounter (Signed)
Neurology referral entered, no new issues.

## 2020-09-27 NOTE — Telephone Encounter (Signed)
error 

## 2020-10-02 ENCOUNTER — Other Ambulatory Visit: Payer: Self-pay

## 2020-10-02 ENCOUNTER — Ambulatory Visit (INDEPENDENT_AMBULATORY_CARE_PROVIDER_SITE_OTHER): Payer: 59

## 2020-10-02 DIAGNOSIS — Z9229 Personal history of other drug therapy: Secondary | ICD-10-CM

## 2020-10-02 DIAGNOSIS — Z5181 Encounter for therapeutic drug level monitoring: Secondary | ICD-10-CM

## 2020-10-02 DIAGNOSIS — I639 Cerebral infarction, unspecified: Secondary | ICD-10-CM | POA: Diagnosis not present

## 2020-10-02 LAB — POCT INR: INR: 2.6 (ref 2.0–3.0)

## 2020-10-02 NOTE — Patient Instructions (Signed)
continue taking 1/2 tablet daily except 1 tablet on Sundays, Tuesdays and Thursdays Be consistent with greens Recheck in 6 weeks

## 2020-10-04 ENCOUNTER — Ambulatory Visit: Payer: 59 | Admitting: Physician Assistant

## 2020-10-08 ENCOUNTER — Ambulatory Visit: Payer: 59 | Admitting: Physician Assistant

## 2020-10-10 ENCOUNTER — Encounter: Payer: 59 | Attending: Internal Medicine | Admitting: Internal Medicine

## 2020-10-10 ENCOUNTER — Other Ambulatory Visit: Payer: Self-pay

## 2020-10-11 ENCOUNTER — Encounter: Payer: Self-pay | Admitting: Cardiology

## 2020-10-11 ENCOUNTER — Ambulatory Visit (INDEPENDENT_AMBULATORY_CARE_PROVIDER_SITE_OTHER): Payer: 59 | Admitting: Cardiology

## 2020-10-11 VITALS — BP 88/58 | HR 82 | Ht 68.0 in | Wt 183.0 lb

## 2020-10-11 DIAGNOSIS — I340 Nonrheumatic mitral (valve) insufficiency: Secondary | ICD-10-CM | POA: Diagnosis not present

## 2020-10-11 DIAGNOSIS — I447 Left bundle-branch block, unspecified: Secondary | ICD-10-CM | POA: Diagnosis not present

## 2020-10-11 DIAGNOSIS — T148XXA Other injury of unspecified body region, initial encounter: Secondary | ICD-10-CM | POA: Insufficient documentation

## 2020-10-11 DIAGNOSIS — I5022 Chronic systolic (congestive) heart failure: Secondary | ICD-10-CM | POA: Diagnosis not present

## 2020-10-11 DIAGNOSIS — I63411 Cerebral infarction due to embolism of right middle cerebral artery: Secondary | ICD-10-CM

## 2020-10-11 DIAGNOSIS — I10 Essential (primary) hypertension: Secondary | ICD-10-CM | POA: Diagnosis not present

## 2020-10-11 DIAGNOSIS — S81809A Unspecified open wound, unspecified lower leg, initial encounter: Secondary | ICD-10-CM

## 2020-10-11 NOTE — Progress Notes (Signed)
Breanna Bennett, Breanna Bennett ([id number]) Visit Report for 10/10/2020 Chief Complaint Document Details Patient Name: AHNIYAH, GIANCOLA Date of Service: 10/10/2020 2:00 PM Medical Record Number: [id number] Patient Account Number: [id number] Date of Birth/Sex: January 26, 1956 (65 y.o. F) Treating RN: Huel Coventry Primary Care Provider: Blane Ohara Other Clinician: Referring Provider: Blane Ohara Treating Provider/Extender: Altamese [place] in Treatment: 0 Information Obtained from: Patient Chief Complaint 10/10/2020; patient is here with wounds on her bilateral lower legs Electronic Signature(s) Signed: 10/11/2020 12:09:37 PM By: Baltazar Najjar MD Entered By: Baltazar Najjar on 10/10/2020 15:43:14 Breanna Bennett ([id number]) -------------------------------------------------------------------------------- Debridement Details Patient Name: Breanna Bennett Date of Service: 10/10/2020 2:00 PM Medical Record Number: [id number] Patient Account Number: [id number] Date of Birth/Sex: 02-01-1956 (64 y.o. F) Treating RN: Huel Coventry Primary Care Provider: COX, KIRSTEN Other Clinician: Referring Provider: COX, KIRSTEN Treating Provider/Extender: Maxwell Caul Weeks in Treatment: 0 Debridement Performed for Wound #1 Right,Proximal Lower Leg Assessment: Performed By: Physician Maxwell Caul, MD Debridement Type: Chemical/Enzymatic/Mechanical Agent Used: santyl and gauze Severity of Tissue Pre Debridement: Fat layer exposed Level of Consciousness (Pre- Awake and Alert procedure): Pre-procedure Verification/Time Out Yes - 14:30 Taken: Instrument: Other : saline and gauze Bleeding: Minimum Hemostasis Achieved: Pressure Response to Treatment: Procedure was tolerated well Level of Consciousness (Post- Awake and Alert procedure): Post Debridement Measurements of Total Wound Length: (cm) 0.4 Width: (cm) 0.5 Depth: (cm) 0.3 Volume: (cm) 0.047 Character of Wound/Ulcer Post Debridement:  Stable Severity of Tissue Post Debridement: Fat layer exposed Post Procedure Diagnosis Same as Pre-procedure Electronic Signature(s) Signed: 10/10/2020 8:13:18 PM By: Elliot Gurney, BSN, RN, CWS, Kim RN, BSN Signed: 10/11/2020 12:09:37 PM By: Baltazar Najjar MD Entered By: Elliot Gurney, BSN, RN, CWS, Kim on 10/10/2020 20:13:18 Breanna Bennett ([id number]) -------------------------------------------------------------------------------- Debridement Details Patient Name: Breanna Bennett Date of Service: 10/10/2020 2:00 PM Medical Record Number: [id number] Patient Account Number: [id number] Date of Birth/Sex: Jul 07, 1956 (64 y.o. F) Treating RN: Huel Coventry Primary Care Provider: COX, KIRSTEN Other Clinician: Referring Provider: COX, KIRSTEN Treating Provider/Extender: Maxwell Caul Weeks in Treatment: 0 Debridement Performed for Wound #2 Right Lower Leg Assessment: Performed By: Physician Maxwell Caul, MD Debridement Type: Chemical/Enzymatic/Mechanical Agent Used: santyl and gauze Severity of Tissue Pre Debridement: Fat layer exposed Level of Consciousness (Pre- Awake and Alert procedure): Pre-procedure Verification/Time Out Yes - 14:30 Taken: Instrument: Other : saline and gauze Bleeding: Minimum Hemostasis Achieved: Pressure Response to Treatment: Procedure was tolerated well Level of Consciousness (Post- Awake and Alert procedure): Post Debridement Measurements of Total Wound Length: (cm) 2.9 Width: (cm) 3.1 Depth: (cm) 0.6 Volume: (cm) 4.236 Character of Wound/Ulcer Post Debridement: Stable Severity of Tissue Post Debridement: Fat layer exposed Post Procedure Diagnosis Same as Pre-procedure Electronic Signature(s) Signed: 10/10/2020 8:18:18 PM By: Elliot Gurney, BSN, RN, CWS, Kim RN, BSN Signed: 10/11/2020 12:09:37 PM By: Baltazar Najjar MD Entered By: Elliot Gurney, BSN, RN, CWS, Kim on 10/10/2020 20:18:18 Breanna Bennett  ([id number]) -------------------------------------------------------------------------------- Debridement Details Patient Name: Breanna Bennett Date of Service: 10/10/2020 2:00 PM Medical Record Number: [id number] Patient Account Number: [id number] Date of Birth/Sex: 1956/07/25 (64 y.o. F) Treating RN: Huel Coventry Primary Care Provider: COX, KIRSTEN Other Clinician: Referring Provider: COX, KIRSTEN Treating Provider/Extender: Maxwell Caul Weeks in Treatment: 0 Debridement Performed for Wound #3 Right,Distal Lower Leg Assessment: Performed By: Physician Maxwell Caul, MD Debridement Type: Chemical/Enzymatic/Mechanical Agent Used: santyl and gauze Severity of Tissue Pre Debridement: Fat layer exposed Level of Consciousness (Pre- Awake and Alert procedure): Pre-procedure Verification/Time Out Yes - 14:30 Taken: Instrument: Other : saline and gauze  Bleeding: Minimum Hemostasis Achieved: Pressure Response to Treatment: Procedure was tolerated well Level of Consciousness (Post- Awake and Alert procedure): Post Debridement Measurements of Total Wound Length: (cm) 2.8 Width: (cm) 2.6 Depth: (cm) 0.4 Volume: (cm) 2.287 Character of Wound/Ulcer Post Debridement: Stable Severity of Tissue Post Debridement: Fat layer exposed Post Procedure Diagnosis Same as Pre-procedure Electronic Signature(s) Signed: 10/10/2020 8:19:02 PM By: Elliot Gurney, BSN, RN, CWS, Kim RN, BSN Signed: 10/11/2020 12:09:37 PM By: Baltazar Najjar MD Entered By: Elliot Gurney, BSN, RN, CWS, Kim on 10/10/2020 20:19:01 Breanna Bennett ([id number]) -------------------------------------------------------------------------------- Debridement Details Patient Name: Breanna Bennett Date of Service: 10/10/2020 2:00 PM Medical Record Number: [id number] Patient Account Number: [id number] Date of Birth/Sex: 1956-02-16 (64 y.o. F) Treating RN: Huel Coventry Primary Care Provider: COX, KIRSTEN Other Clinician: Referring Provider:  COX, KIRSTEN Treating Provider/Extender: Maxwell Caul Weeks in Treatment: 0 Debridement Performed for Wound #4 Left Lower Leg Assessment: Performed By: Physician Maxwell Caul, MD Debridement Type: Chemical/Enzymatic/Mechanical Agent Used: santyl and gauze Severity of Tissue Pre Debridement: Fat layer exposed Level of Consciousness (Pre- Awake and Alert procedure): Pre-procedure Verification/Time Out Yes - 14:30 Taken: Instrument: Other : saline and gauze Bleeding: Minimum Hemostasis Achieved: Pressure Response to Treatment: Procedure was tolerated well Level of Consciousness (Post- Awake and Alert procedure): Post Debridement Measurements of Total Wound Length: (cm) 4 Width: (cm) 2.4 Depth: (cm) 0.3 Volume: (cm) 2.262 Character of Wound/Ulcer Post Debridement: Stable Severity of Tissue Post Debridement: Fat layer exposed Post Procedure Diagnosis Same as Pre-procedure Electronic Signature(s) Signed: 10/10/2020 8:19:43 PM By: Elliot Gurney, BSN, RN, CWS, Kim RN, BSN Signed: 10/11/2020 12:09:37 PM By: Baltazar Najjar MD Entered By: Elliot Gurney, BSN, RN, CWS, Kim on 10/10/2020 20:19:43 Breanna Bennett ([id number]) -------------------------------------------------------------------------------- Debridement Details Patient Name: Breanna Bennett Date of Service: 10/10/2020 2:00 PM Medical Record Number: [id number] Patient Account Number: [id number] Date of Birth/Sex: 04-01-1956 (64 y.o. F) Treating RN: Huel Coventry Primary Care Provider: COX, KIRSTEN Other Clinician: Referring Provider: COX, KIRSTEN Treating Provider/Extender: Maxwell Caul Weeks in Treatment: 0 Debridement Performed for Wound #6 Left,Medial Lower Leg Assessment: Performed By: Physician Maxwell Caul, MD Debridement Type: Chemical/Enzymatic/Mechanical Agent Used: santyl and gauze Severity of Tissue Pre Debridement: Fat layer exposed Level of Consciousness (Pre- Awake and  Alert procedure): Pre-procedure Verification/Time Out Yes - 14:30 Taken: Instrument: Other : saline and gauze Bleeding: Minimum Hemostasis Achieved: Pressure Response to Treatment: Procedure was tolerated well Level of Consciousness (Post- Awake and Alert procedure): Post Debridement Measurements of Total Wound Length: (cm) 0.5 Width: (cm) 0.5 Depth: (cm) 0.3 Volume: (cm) 0.059 Character of Wound/Ulcer Post Debridement: Stable Severity of Tissue Post Debridement: Fat layer exposed Post Procedure Diagnosis Same as Pre-procedure Electronic Signature(s) Signed: 10/10/2020 8:21:38 PM By: Elliot Gurney, BSN, RN, CWS, Kim RN, BSN Signed: 10/11/2020 12:09:37 PM By: Baltazar Najjar MD Entered By: Elliot Gurney, BSN, RN, CWS, Kim on 10/10/2020 20:21:38 Breanna Bennett ([id number]) -------------------------------------------------------------------------------- Debridement Details Patient Name: Breanna Bennett Date of Service: 10/10/2020 2:00 PM Medical Record Number: [id number] Patient Account Number: [id number] Date of Birth/Sex: April 14, 1956 (64 y.o. F) Treating RN: Huel Coventry Primary Care Provider: COX, KIRSTEN Other Clinician: Referring Provider: COX, KIRSTEN Treating Provider/Extender: Maxwell Caul Weeks in Treatment: 0 Debridement Performed for Wound #7 Left,Posterior Lower Leg Assessment: Performed By: Physician Maxwell Caul, MD Debridement Type: Chemical/Enzymatic/Mechanical Agent Used: santyl and gauze Severity of Tissue Pre Debridement: Fat layer exposed Level of Consciousness (Pre- Awake and Alert procedure): Pre-procedure Verification/Time Out Yes - 14:30 Taken: Instrument: Other : saline and gauze Bleeding: Minimum Hemostasis Achieved:  Pressure Response to Treatment: Procedure was tolerated well Level of Consciousness (Post- Awake and Alert procedure): Post Debridement Measurements of Total Wound Length: (cm) 2.2 Width: (cm) 0.5 Depth: (cm) 0.3 Volume: (cm)  0.259 Character of Wound/Ulcer Post Debridement: Stable Severity of Tissue Post Debridement: Fat layer exposed Post Procedure Diagnosis Same as Pre-procedure Electronic Signature(s) Signed: 10/10/2020 8:26:43 PM By: Elliot Gurney, BSN, RN, CWS, Kim RN, BSN Signed: 10/11/2020 12:09:37 PM By: Baltazar Najjar MD Entered By: Elliot Gurney, BSN, RN, CWS, Kim on 10/10/2020 20:26:43 Breanna Bennett ([id number]) -------------------------------------------------------------------------------- HPI Details Patient Name: Breanna Bennett Date of Service: 10/10/2020 2:00 PM Medical Record Number: [id number] Patient Account Number: [id number] Date of Birth/Sex: 1955/12/21 (64 y.o. F) Treating RN: Huel Coventry Primary Care Provider: Blane Ohara Other Clinician: Referring Provider: COX, KIRSTEN Treating Provider/Extender: Maxwell Caul Weeks in Treatment: 0 History of Present Illness HPI Description: ADMISSION 10/10/2020 This is a pleasant woman from [facility]. She is here accompanied by her daughter for review of wounds on her bilateral lower legs. Seems like these are chronic and have been present for the most part for about a year. She had compression stockings at one point but I am not sure that she is actually been wearing them. She has about 4 open areas on the left and more substantially for open areas on the right. She is a diabetic but is not on any current treatment. Her most other substantive problem is a severe cardiomyopathy with an ejection fraction less than 20%. Her daughter tells me that she runs a generally low blood pressure and she only takes 10 mg of Lasix with an adjustment if her blood pressure is over a certain value giving it additional 10 mg. It does not seem like she has gained weight recently she says he is at 160 pounds and for her this is actually a reduction. They have been using Medihoney and Neosporin to this area. No recent compression wrap use. There are 4 open areas on  the left and 4 open areas on the right. ABIs in our clinic were 1.35 on the right and 1.22 on the left Electronic Signature(s) Signed: 10/11/2020 12:09:37 PM By: Baltazar Najjar MD Entered By: Baltazar Najjar on 10/10/2020 15:46:06 Breanna Bennett ([id number]) -------------------------------------------------------------------------------- Physical Exam Details Patient Name: Breanna Bennett Date of Service: 10/10/2020 2:00 PM Medical Record Number: [id number] Patient Account Number: [id number] Date of Birth/Sex: [birth year] (64 y.o. F) Treating RN: Huel Coventry Primary Care Provider: COX, KIRSTEN Other Clinician: Referring Provider: COX, KIRSTEN Treating Provider/Extender: Maxwell Caul Weeks in Treatment: 0 Constitutional Patient is hypotensive.. Pulse regular and within target range for patient.Marland Kitchen Respirations regular, non-labored and within target range.. Temperature is normal and within the target range for the patient.Marland Kitchen appears in no distress. Respiratory Respiratory effort is easy and symmetric bilaterally. Rate is normal at rest and on room air.. Slightly reduced air entry at the right lower lobe which may represent a pleural effusion. Cardiovascular Heart sounds are almost an audible. No murmurs jugular venous pressure is easily seen with a tall V wave. Pedal pulses are palpable. Marked pitting edema in her bilateral lower legs that extends easily into the thighs. Integumentary (Hair, Skin) Some skin changes of chronic venous insufficiency. Psychiatric No evidence of depression, anxiety, or agitation. Calm, cooperative, and communicative. Appropriate interactions and affect.. Notes Wound exam; the patient has 1 large area on the left anterior lower leg and 2 in the right but there are smaller areas. There is slough in these wounds but there is  also weeping edema fluid coming out of them therefore no debridement. No evidence of surrounding infection Electronic  Signature(s) Signed: 10/11/2020 12:09:37 PM By: Baltazar Najjarobson, Michael MD Entered By: Baltazar Najjarobson, Michael on 10/10/2020 15:48:50 Breanna CruiseASSIDY, Breanna Bennett ([id number]) -------------------------------------------------------------------------------- Physician Orders Details Patient Name: Breanna CruiseASSIDY, Breanna Bennett Date of Service: 10/10/2020 2:00 PM Medical Record Number: [id number] Patient Account Number: [id number] Date of Birth/Sex: [birth year] (64 y.o. F) Treating RN: Huel CoventryWoody, Kim Primary Care Provider: COX, KIRSTEN Other Clinician: Referring Provider: COX, KIRSTEN Treating Provider/Extender: Altamese CarolinaOBSON, MICHAEL G Weeks in Treatment: 0 Verbal / Phone Orders: No Diagnosis Coding ICD-10 Coding Code Description E11.622 Type 2 diabetes mellitus with other skin ulcer I87.323 Chronic venous hypertension (idiopathic) with inflammation of bilateral lower extremity L97.828 Non-pressure chronic ulcer of other part of left lower leg with other specified severity L97.818 Non-pressure chronic ulcer of other part of right lower leg with other specified severity I50.22 Chronic systolic (congestive) heart failure Follow-up Appointments o Return Appointment in 1 week. Bathing/ Shower/ Hygiene o May shower; gently cleanse wound with antibacterial soap, rinse and pat dry prior to dressing wounds Edema Control - Lymphedema / Segmental Compressive Device / Other Bilateral Lower Extremities o Elevate, Exercise Daily and Avoid Standing for Long Periods of Time. o Elevate legs to the level of the heart and pump ankles as often as possible o Elevate leg(s) parallel to the floor when sitting. Wound Treatment Wound #1 - Lower Leg Wound Laterality: Right, Proximal Primary Dressing: Silvercel Small 2x2 (in/in) (DME) (Generic) 1 x Per Day/30 Days Discharge Instructions: Apply Silvercel Small 2x2 (in/in) as instructed Secondary Dressing: Kerlix 4.5 x 4.1 (in/yd) (DME) (Generic) 1 x Per Day/30 Days Discharge Instructions: Apply Kerlix 4.5  x 4.1 (in/yd) as instructed Secondary Dressing: Xtrasorb Medium 4x5 (in/in) (DME) (Generic) 1 x Per Day/30 Days Discharge Instructions: Apply to wound as directed. Do not cut. Secured With: 227M Medipore H Soft Cloth Surgical Tape, 2x2 (in/yd) (DME) (Generic) 1 x Per Day/30 Days Secured With: Coban Cohesive Bandage 4x5 (yds) Stretched (DME) (Generic) 1 x Per Day/30 Days Discharge Instructions: Apply Coban as directed. Wound #2 - Lower Leg Wound Laterality: Right Primary Dressing: Silvercel Small 2x2 (in/in) (DME) (Generic) 1 x Per Day/30 Days Discharge Instructions: Apply Silvercel Small 2x2 (in/in) as instructed Secondary Dressing: Kerlix 4.5 x 4.1 (in/yd) (DME) (Generic) 1 x Per Day/30 Days Discharge Instructions: Apply Kerlix 4.5 x 4.1 (in/yd) as instructed Secondary Dressing: Xtrasorb Medium 4x5 (in/in) (DME) (Generic) 1 x Per Day/30 Days Discharge Instructions: Apply to wound as directed. Do not cut. Secured With: 227M Medipore H Soft Cloth Surgical Tape, 2x2 (in/yd) (DME) (Generic) 1 x Per Day/30 Days Secured With: Coban Cohesive Bandage 4x5 (yds) Stretched (DME) (Generic) 1 x Per Day/30 Days Discharge Instructions: Apply Coban as directed. Wound #3 - Lower Leg Wound Laterality: Right, Distal Breanna Bennett, Breanna Bennett ([id number]) Primary Dressing: Silvercel Small 2x2 (in/in) (DME) (Generic) 1 x Per Day/30 Days Discharge Instructions: Apply Silvercel Small 2x2 (in/in) as instructed Secondary Dressing: Kerlix 4.5 x 4.1 (in/yd) (DME) (Generic) 1 x Per Day/30 Days Discharge Instructions: Apply Kerlix 4.5 x 4.1 (in/yd) as instructed Secondary Dressing: Xtrasorb Medium 4x5 (in/in) (DME) (Generic) 1 x Per Day/30 Days Discharge Instructions: Apply to wound as directed. Do not cut. Secured With: 227M Medipore H Soft Cloth Surgical Tape, 2x2 (in/yd) (DME) (Generic) 1 x Per Day/30 Days Secured With: Coban Cohesive Bandage 4x5 (yds) Stretched (DME) (Generic) 1 x Per Day/30 Days Discharge Instructions: Apply  Coban as directed. Wound #4 - Lower Leg Wound Laterality: Left  Primary Dressing: Silvercel Small 2x2 (in/in) (DME) (Generic) 1 x Per Day/30 Days Discharge Instructions: Apply Silvercel Small 2x2 (in/in) as instructed Secondary Dressing: Kerlix 4.5 x 4.1 (in/yd) (DME) (Generic) 1 x Per Day/30 Days Discharge Instructions: Apply Kerlix 4.5 x 4.1 (in/yd) as instructed Secondary Dressing: Xtrasorb Medium 4x5 (in/in) (DME) (Generic) 1 x Per Day/30 Days Discharge Instructions: Apply to wound as directed. Do not cut. Secured With: 61M Medipore H Soft Cloth Surgical Tape, 2x2 (in/yd) (DME) (Generic) 1 x Per Day/30 Days Secured With: Coban Cohesive Bandage 4x5 (yds) Stretched (DME) (Generic) 1 x Per Day/30 Days Discharge Instructions: Apply Coban as directed. Wound #5 - Lower Leg Wound Laterality: Right, Medial Primary Dressing: Silvercel Small 2x2 (in/in) (DME) (Generic) 1 x Per Day/30 Days Discharge Instructions: Apply Silvercel Small 2x2 (in/in) as instructed Secondary Dressing: Kerlix 4.5 x 4.1 (in/yd) (DME) (Generic) 1 x Per Day/30 Days Discharge Instructions: Apply Kerlix 4.5 x 4.1 (in/yd) as instructed Secondary Dressing: Xtrasorb Medium 4x5 (in/in) 1 x Per Day/30 Days Discharge Instructions: Apply to wound as directed. Do not cut. Secured With: 61M Medipore H Soft Cloth Surgical Tape, 2x2 (in/yd) (DME) (Generic) 1 x Per Day/30 Days Secured With: Coban Cohesive Bandage 4x5 (yds) Stretched (DME) (Generic) 1 x Per Day/30 Days Discharge Instructions: Apply Coban as directed. Wound #6 - Lower Leg Wound Laterality: Left, Medial Primary Dressing: Silvercel Small 2x2 (in/in) (DME) (Generic) 1 x Per Day/30 Days Discharge Instructions: Apply Silvercel Small 2x2 (in/in) as instructed Secondary Dressing: Kerlix 4.5 x 4.1 (in/yd) (DME) (Generic) 1 x Per Day/30 Days Discharge Instructions: Apply Kerlix 4.5 x 4.1 (in/yd) as instructed Secondary Dressing: Xtrasorb Medium 4x5 (in/in) (DME) (Generic) 1 x Per  Day/30 Days Discharge Instructions: Apply to wound as directed. Do not cut. Secured With: 61M Medipore H Soft Cloth Surgical Tape, 2x2 (in/yd) (DME) (Generic) 1 x Per Day/30 Days Secured With: Coban Cohesive Bandage 4x5 (yds) Stretched (DME) (Generic) 1 x Per Day/30 Days Discharge Instructions: Apply Coban as directed. Wound #7 - Lower Leg Wound Laterality: Left, Posterior Primary Dressing: Silvercel Small 2x2 (in/in) (DME) (Generic) 1 x Per Day/30 Days Discharge Instructions: Apply Silvercel Small 2x2 (in/in) as instructed Secondary Dressing: Kerlix 4.5 x 4.1 (in/yd) (DME) (Generic) 1 x Per Day/30 Days Discharge Instructions: Apply Kerlix 4.5 x 4.1 (in/yd) as instructed Breanna Bennett, Breanna Bennett ([id number]) Secondary Dressing: Xtrasorb Medium 4x5 (in/in) (DME) (Generic) 1 x Per Day/30 Days Discharge Instructions: Apply to wound as directed. Do not cut. Secured With: 61M Medipore H Soft Cloth Surgical Tape, 2x2 (in/yd) (DME) (Generic) 1 x Per Day/30 Days Secured With: Coban Cohesive Bandage 4x5 (yds) Stretched (DME) (Generic) 1 x Per Day/30 Days Discharge Instructions: Apply Coban as directed. Electronic Signature(s) Signed: 10/10/2020 9:26:32 PM By: Elliot Gurney, BSN, RN, CWS, Kim RN, BSN Signed: 10/11/2020 12:09:37 PM By: Baltazar Najjar MD Previous Signature: 10/10/2020 8:49:50 PM Version By: Elliot Gurney BSN, RN, CWS, Kim RN, BSN Previous Signature: 10/10/2020 8:33:47 PM Version By: Elliot Gurney BSN, RN, CWS, Kim RN, BSN Entered By: Elliot Gurney, BSN, RN, CWS, Kim on 10/10/2020 20:56:52 Breanna Bennett ([id number]) -------------------------------------------------------------------------------- Problem List Details Patient Name: Breanna Bennett Date of Service: 10/10/2020 2:00 PM Medical Record Number: [id number] Patient Account Number: [id number] Date of Birth/Sex: 03-24-1956 (64 y.o. F) Treating RN: Huel Coventry Primary Care Provider: Blane Ohara Other Clinician: Referring Provider: COX, KIRSTEN Treating  Provider/Extender: Maxwell Caul Weeks in Treatment: 0 Active Problems ICD-10 Encounter Code Description Active Date MDM Diagnosis E11.622 Type 2 diabetes mellitus with other skin ulcer 10/10/2020 No Yes  R60.454 Chronic venous hypertension (idiopathic) with inflammation of bilateral 10/10/2020 No Yes lower extremity L97.828 Non-pressure chronic ulcer of other part of left lower leg with other 10/10/2020 No Yes specified severity L97.818 Non-pressure chronic ulcer of other part of right lower leg with other 10/10/2020 No Yes specified severity I50.22 Chronic systolic (congestive) heart failure 10/10/2020 No Yes Inactive Problems Resolved Problems Electronic Signature(s) Signed: 10/11/2020 12:09:37 PM By: Baltazar Najjar MD Entered By: Baltazar Najjar on 10/10/2020 15:39:06 Breanna Bennett ([id number]) -------------------------------------------------------------------------------- Progress Note Details Patient Name: Breanna Bennett Date of Service: 10/10/2020 2:00 PM Medical Record Number: [id number] Patient Account Number: [id number] Date of Birth/Sex: 1955/10/23 (64 y.o. F) Treating RN: Huel Coventry Primary Care Provider: Blane Ohara Other Clinician: Referring Provider: COX, KIRSTEN Treating Provider/Extender: Altamese [place] in Treatment: 0 Subjective Chief Complaint Information obtained from Patient 10/10/2020; patient is here with wounds on her bilateral lower legs History of Present Illness (HPI) ADMISSION 10/10/2020 This is a pleasant woman from [facility]. She is here accompanied by her daughter for review of wounds on her bilateral lower legs. Seems like these are chronic and have been present for the most part for about a year. She had compression stockings at one point but I am not sure that she is actually been wearing them. She has about 4 open areas on the left and more substantially for open areas on the right. She is a diabetic but is not on any  current treatment. Her most other substantive problem is a severe cardiomyopathy with an ejection fraction less than 20%. Her daughter tells me that she runs a generally low blood pressure and she only takes 10 mg of Lasix with an adjustment if her blood pressure is over a certain value giving it additional 10 mg. It does not seem like she has gained weight recently she says he is at 160 pounds and for her this is actually a reduction. They have been using Medihoney and Neosporin to this area. No recent compression wrap use. There are 4 open areas on the left and 4 open areas on the right. ABIs in our clinic were 1.35 on the right and 1.22 on the left Patient History Information obtained from Patient. Allergies No Known Drug Allergies Social History Never smoker, Marital Status - Married, Alcohol Use - Never, Drug Use - No History, Caffeine Use - Moderate. Medical History Eyes Denies history of Cataracts, Glaucoma, Optic Neuritis Ear/Nose/Mouth/Throat Denies history of Chronic sinus problems/congestion, Middle ear problems Hematologic/Lymphatic Denies history of Anemia, Hemophilia, Human Immunodeficiency Virus, Lymphedema, Sickle Cell Disease Respiratory Denies history of Aspiration, Asthma, Chronic Obstructive Pulmonary Disease (COPD), Pneumothorax, Sleep Apnea, Tuberculosis Cardiovascular Patient has history of Congestive Heart Failure, Hypertension Denies history of Angina, Arrhythmia, Coronary Artery Disease, Deep Vein Thrombosis, Hypotension, Myocardial Infarction, Peripheral Arterial Disease, Peripheral Venous Disease, Phlebitis, Vasculitis Gastrointestinal Denies history of Cirrhosis , Colitis, Crohn s, Hepatitis A, Hepatitis B, Hepatitis C Endocrine Patient has history of Type II Diabetes Genitourinary Denies history of End Stage Renal Disease Immunological Denies history of Lupus Erythematosus, Raynaud s, Scleroderma Integumentary (Skin) Denies history of History of Burn,  History of pressure wounds Musculoskeletal Denies history of Gout, Rheumatoid Arthritis, Osteoarthritis, Osteomyelitis Neurologic Denies history of Dementia, Neuropathy, Quadriplegia, Paraplegia, Seizure Disorder Oncologic Denies history of Received Chemotherapy, Received Radiation Psychiatric Denies history of Anorexia/bulimia, Confinement Anxiety Blood sugar is not tested. Review of Systems (ROS) Ing, Jenell ([id number]) Constitutional Symptoms (General Health) Denies complaints or symptoms of Fatigue, Fever, Chills, Marked Weight Change. Eyes  Complains or has symptoms of Vision Changes. Denies complaints or symptoms of Dry Eyes, Glasses / Contacts. Ear/Nose/Mouth/Throat Denies complaints or symptoms of Difficult clearing ears, Sinusitis. Hematologic/Lymphatic Denies complaints or symptoms of Bleeding / Clotting Disorders, Human Immunodeficiency Virus. Respiratory Denies complaints or symptoms of Chronic or frequent coughs, Shortness of Breath. Cardiovascular Denies complaints or symptoms of Chest pain, LE edema. Gastrointestinal Denies complaints or symptoms of Frequent diarrhea, Nausea, Vomiting. Endocrine Denies complaints or symptoms of Hepatitis, Thyroid disease, Polydypsia (Excessive Thirst). Genitourinary Denies complaints or symptoms of Kidney failure/ Dialysis, Incontinence/dribbling. Immunological Denies complaints or symptoms of Hives, Itching. Integumentary (Skin) Complains or has symptoms of Wounds, Swelling. Denies complaints or symptoms of Bleeding or bruising tendency, Breakdown. Musculoskeletal Denies complaints or symptoms of Muscle Pain, Muscle Weakness. Neurologic Denies complaints or symptoms of Numbness/parasthesias, Focal/Weakness. Psychiatric Denies complaints or symptoms of Anxiety, Claustrophobia. Objective Constitutional Patient is hypotensive.. Pulse regular and within target range for patient.Marland Kitchen Respirations regular, non-labored and within  target range.. Temperature is normal and within the target range for the patient.Marland Kitchen appears in no distress. Vitals Time Taken: 2:13 PM, Height: 68 in, Source: Stated, Weight: 160 lbs, Source: Stated, BMI: 24.3, Temperature: 98.2 F, Pulse: 77 bpm, Respiratory Rate: 18 breaths/min, Blood Pressure: 98/80 mmHg. Respiratory Respiratory effort is easy and symmetric bilaterally. Rate is normal at rest and on room air.. Slightly reduced air entry at the right lower lobe which may represent a pleural effusion. Cardiovascular Heart sounds are almost an audible. No murmurs jugular venous pressure is easily seen with a tall V wave. Pedal pulses are palpable. Marked pitting edema in her bilateral lower legs that extends easily into the thighs. Psychiatric No evidence of depression, anxiety, or agitation. Calm, cooperative, and communicative. Appropriate interactions and affect.. General Notes: Wound exam; the patient has 1 large area on the left anterior lower leg and 2 in the right but there are smaller areas. There is slough in these wounds but there is also weeping edema fluid coming out of them therefore no debridement. No evidence of surrounding infection Integumentary (Hair, Skin) Some skin changes of chronic venous insufficiency. Wound #1 status is Open. Original cause of wound was Gradually Appeared. The wound is located on the Right,Proximal Lower Leg. The wound measures 0.4cm length x 0.5cm width x 0.2cm depth; 0.157cm^2 area and 0.031cm^3 volume. There is no tunneling or undermining noted. There is a medium amount of serosanguineous drainage noted. There is medium (34-66%) red, pink granulation within the wound bed. There is a medium (34-66%) amount of necrotic tissue within the wound bed including Adherent Slough. Wound #2 status is Open. Original cause of wound was Gradually Appeared. The wound is located on the Right Lower Leg. The wound measures 2.9cm length x 3.1cm width x 0.6cm depth;  7.061cm^2 area and 4.236cm^3 volume. There is Fat Layer (Subcutaneous Tissue) exposed. There is no tunneling or undermining noted. There is a medium amount of serosanguineous drainage noted. There is small (1-33%) pink, pale granulation within the wound bed. There is a large (67-100%) amount of necrotic tissue within the wound bed including Adherent Slough. Wound #3 status is Open. Original cause of wound was Gradually Appeared. The wound is located on the Right,Distal Lower Leg. The wound Breanna Bennett, Breanna Bennett ([id number]) measures 2.8cm length x 2.6cm width x 0.4cm depth; 5.718cm^2 area and 2.287cm^3 volume. There is Fat Layer (Subcutaneous Tissue) exposed. There is no tunneling or undermining noted. There is a medium amount of serosanguineous drainage noted. There is small (1-33%) pink, pale granulation  within the wound bed. There is a large (67-100%) amount of necrotic tissue within the wound bed including Eschar and Adherent Slough. Wound #4 status is Open. Original cause of wound was Gradually Appeared. The wound is located on the Left Lower Leg. The wound measures 4cm length x 2.4cm width x 0.3cm depth; 7.54cm^2 area and 2.262cm^3 volume. There is Fat Layer (Subcutaneous Tissue) exposed. There is no tunneling or undermining noted. There is a medium amount of serosanguineous drainage noted. There is small (1-33%) pink, pale granulation within the wound bed. There is a large (67-100%) amount of necrotic tissue within the wound bed including Adherent Slough. Wound #5 status is Open. Original cause of wound was Gradually Appeared. The wound is located on the Right,Medial Lower Leg. The wound measures 5.5cm length x 3.5cm width x 0.2cm depth; 15.119cm^2 area and 3.024cm^3 volume. There is Fat Layer (Subcutaneous Tissue) exposed. There is no tunneling or undermining noted. There is a large amount of serous drainage noted. The wound margin is flat and intact. There is medium (34-66%) red granulation within  the wound bed. There is a medium (34-66%) amount of necrotic tissue within the wound bed including Adherent Slough. Wound #6 status is Open. Original cause of wound was Gradually Appeared. The wound is located on the Left,Medial Lower Leg. The wound measures 0.5cm length x 0.5cm width x 0.3cm depth; 0.196cm^2 area and 0.059cm^3 volume. There is Fat Layer (Subcutaneous Tissue) exposed. There is a medium amount of serous drainage noted. There is medium (34-66%) pink granulation within the wound bed. There is a medium (34-66%) amount of necrotic tissue within the wound bed including Adherent Slough. Wound #7 status is Open. Original cause of wound was Gradually Appeared. The wound is located on the Left,Posterior Lower Leg. The wound measures 2.2cm length x 0.5cm width x 0.3cm depth; 0.864cm^2 area and 0.259cm^3 volume. There is Fat Layer (Subcutaneous Tissue) exposed. There is no tunneling or undermining noted. There is a large amount of serous drainage noted. There is medium (34-66%) pink granulation within the wound bed. There is a medium (34-66%) amount of necrotic tissue within the wound bed including Adherent Slough. 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 This patient is absolutely full of edema this includes her bilateral lower legs up into her upper thighs, coccyx. She has elevated jugular venous pressure at probably 80 degrees. 2. She is listed as having a severe nonischemic cardiomyopathy and is on anticoagulants. She is on surprisingly little she does take Lasix and spironolactone. 3. Paradoxically she actually has an appointment  with her cardiologist tomorrow I am really hopeful that they can gently increase her diuretic to get some diuresis here otherwise with this amount of edema in her legs I am not sure were going to be able to close this. 4. Because of the weeping edema fluid silver alginate Xtrasorb ABDs under kerlix Coban. I did not think that more aggressive compression was currently indicated because the fluid in her legs might push things into heart failure. 5. The presence of sustained hypotension all because of severe cardiomyopathy might suggest a end-stage condition. I am hopeful that there may be something more that can be done. I have left this to cardiology  Electronic Signature(s) Signed: 10/11/2020 12:09:37 PM By: Baltazar Najjar MD Entered By: Baltazar Najjar on 10/10/2020 15:50:58 Breanna Bennett ([id number]) -------------------------------------------------------------------------------- ROS/PFSH Details Patient Name: Breanna Bennett Date of Service: 10/10/2020 2:00 PM Medical Record Number: [id number] Patient Account Number: [id number] Date of Birth/Sex: May 02, 1956 (64 y.o. F) Treating RN: Yevonne Pax Primary Care Provider: COX, KIRSTEN Other Clinician: Referring Provider: COX, KIRSTEN Treating Provider/Extender: Maxwell Caul Weeks in Treatment: 0 Information Obtained From Patient Constitutional Symptoms (General Health) Complaints and Symptoms: Negative for: Fatigue; Fever; Chills; Marked Weight Change Eyes Complaints and Symptoms: Positive for: Vision Changes Negative for: Dry Eyes; Glasses / Contacts Medical History: Negative for: Cataracts; Glaucoma; Optic Neuritis Ear/Nose/Mouth/Throat Complaints and Symptoms: Negative for: Difficult clearing ears; Sinusitis Medical History: Negative for: Chronic sinus problems/congestion; Middle ear problems Hematologic/Lymphatic Complaints and Symptoms: Negative for: Bleeding / Clotting Disorders; Human Immunodeficiency Virus Medical  History: Negative for: Anemia; Hemophilia; Human Immunodeficiency Virus; Lymphedema; Sickle Cell Disease Respiratory Complaints and Symptoms: Negative for: Chronic or frequent coughs; Shortness of Breath Medical History: Negative for: Aspiration; Asthma; Chronic Obstructive Pulmonary Disease (COPD); Pneumothorax; Sleep Apnea; Tuberculosis Cardiovascular Complaints and Symptoms: Negative for: Chest pain; LE edema Medical History: Positive for: Congestive Heart Failure; Hypertension Negative for: Angina; Arrhythmia; Coronary Artery Disease; Deep Vein Thrombosis; Hypotension; Myocardial Infarction; Peripheral Arterial Disease; Peripheral Venous Disease; Phlebitis; Vasculitis Gastrointestinal Complaints and Symptoms: Negative for: Frequent diarrhea; Nausea; Vomiting Medical History: Negative for: Cirrhosis ; Colitis; Crohnos; Hepatitis A; Hepatitis B; Hepatitis C Endocrine Breanna Bennett, Breanna Bennett ([id number]) Complaints and Symptoms: Negative for: Hepatitis; Thyroid disease; Polydypsia (Excessive Thirst) Medical History: Positive for: Type II Diabetes Time with diabetes: 10 months Blood sugar tested every day: No Genitourinary Complaints and Symptoms: Negative for: Kidney failure/ Dialysis; Incontinence/dribbling Medical History: Negative for: End Stage Renal Disease Immunological Complaints and Symptoms: Negative for: Hives; Itching Medical History: Negative for: Lupus Erythematosus; Raynaudos; Scleroderma Integumentary (Skin) Complaints and Symptoms: Positive for: Wounds; Swelling Negative for: Bleeding or bruising tendency; Breakdown Medical History: Negative for: History of Burn; History of pressure wounds Musculoskeletal Complaints and Symptoms: Negative for: Muscle Pain; Muscle Weakness Medical History: Negative for: Gout; Rheumatoid Arthritis; Osteoarthritis; Osteomyelitis Neurologic Complaints and Symptoms: Negative for: Numbness/parasthesias; Focal/Weakness Medical  History: Negative for: Dementia; Neuropathy; Quadriplegia; Paraplegia; Seizure Disorder Psychiatric Complaints and Symptoms: Negative for: Anxiety; Claustrophobia Medical History: Negative for: Anorexia/bulimia; Confinement Anxiety Oncologic Medical History: Negative for: Received Chemotherapy; Received Radiation Immunizations Implantable Devices None Family and Social History Breanna Bennett, Breanna Bennett ([id number]) Never smoker; Marital Status - Married; Alcohol Use: Never; Drug Use: No History; Caffeine Use: Moderate; Financial Concerns: No; Food, Clothing or Shelter Needs: No; Support System Lacking: No; Transportation Concerns: No Electronic Signature(s) Signed: 10/11/2020 9:40:15 AM By: Yevonne Pax RN Signed: 10/11/2020 12:09:37 PM By: Baltazar Najjar MD Entered By: Yevonne Pax on 10/10/2020 14:42:18 Breanna Bennett ([id number]) -------------------------------------------------------------------------------- SuperBill Details Patient Name: Breanna Bennett Date of Service: 10/10/2020 Medical Record Number: [id number] Patient Account Number: [id number] Date of Birth/Sex: [birth year] (64 y.o. F) Treating RN: Huel Coventry Primary Care Provider: COX, KIRSTEN Other Clinician: Referring Provider: COX, KIRSTEN Treating Provider/Extender: Maxwell Caul Weeks in Treatment: 0 Diagnosis Coding ICD-10 Codes Code Description E11.622 Type 2 diabetes mellitus with other skin ulcer I87.323 Chronic venous hypertension (idiopathic) with inflammation of bilateral lower extremity L97.828 Non-pressure chronic ulcer of other part of left lower leg with other specified severity L97.818 Non-pressure chronic ulcer of other part of right lower leg with other specified severity I50.22 Chronic systolic (congestive) heart failure Physician Procedures CPT4 Code: [id number] Description: 99204 - WC  PHYS LEVEL 4 - NEW PT Modifier: Quantity: 1 CPT4 Code: Description: ICD-10 Diagnosis Description L97.828  Non-pressure chronic ulcer of other part of left lower leg with other speci L97.818 Non-pressure chronic ulcer of other part of right lower leg with other spec I87.323 Chronic venous hypertension  (idiopathic) with inflammation of bilateral low E11.622 Type 2 diabetes mellitus with other skin ulcer Modifier: fied severity ified severity er extremity Quantity: Electronic Signature(s) Signed: 10/11/2020 12:09:37 PM By: Baltazar Najjar MD Entered By: Baltazar Najjar on 10/10/2020 15:51:53

## 2020-10-11 NOTE — Progress Notes (Signed)
Cardiology Office Note:    Date:  10/11/2020   ID:  Breanna Bennett, DOB 12-16-1955, MRN [id number]  PCP:  Blane Ohara, MD  Cardiologist:  Gypsy Balsam, MD    Referring MD: Selena Batten *   Chief Complaint  Patient presents with  . Follow-up  And doing better  History of Present Illness:    Breanna Bennett is a 65 y.o. female with complex past medical history which include cardiomyopathy with ejection fraction 20 to 25%, normal cardiac catheterization few years ago, history of CVA, left bundle branch block, dyslipidemia, essential hypertension.  She refused ICD or any advanced therapies for her cardiomyopathy.  She is being in the hospital couple weeks ago because of fluid overload as well as multiple wounds on her legs that were draining.  She was discharged home after that comes today to my office for follow-up overall she says she is doing well swelling is still there she does see 1 doctor and she is very happy with the service she is receiving there overall: She is very happy and cheerful.  Past Medical History:  Diagnosis Date  . Acute CVA (cerebrovascular accident) (HCC) 05/30/2019  . Acute respiratory failure (HCC)   . Acute systolic CHF (congestive heart failure) (HCC) 01/12/2019  . Cardiac cachexia 07/04/2020  . Chronic systolic CHF (congestive heart failure) (HCC)   . Class 2 severe obesity due to excess calories with serious comorbidity and body mass index (BMI) of 38.0 to 38.9 in adult (HCC) 10/19/2018  . Cough due to bronchospasm 02/16/2019  . Elevated hemoglobin (HCC)   . Elevated uric acid in blood 06/06/2019  . Essential hypertension 01/12/2019  . Gastroesophageal reflux disease 10/19/2018  . GERD (gastroesophageal reflux disease)   . History of Coumadin therapy 06/16/2019  . History of CVA (cerebrovascular accident) 05/30/2019  . History of gout 12/06/2019  . History of hemiplegia 05/30/2019  . Hospital discharge follow-up 06/24/2019  . Hyperlipidemia   .  Hypertension   . Hypokalemia 12/06/2019  . Impaired mobility and ADLs 05/30/2019  . Late effect of cerebrovascular accident (CVA) 12/12/2019  . LBBB (left bundle branch block)   . Left ventricular ejection fraction less than 20% 05/30/2019  . Middle cerebral artery embolism, 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  . Mild renal insufficiency   . Mitral regurgitation   . NICM (nonischemic cardiomyopathy) (HCC)   . Nonischemic cardiomyopathy [facility]) cardiac catheterization May 2020 showed normal coronaries, ejection fraction 30% 02/09/2019  . Open wound 1 year ago   Bilateral legs  . Perennial allergic rhinitis 02/16/2019  . Peripheral edema 12/06/2019  . Pre-diabetes   . Stroke (cerebrum) (HCC) - R MCA infarct s/p mechanical thrombectomy 05/19/2019  . Systolic CHF, acute (HCC) 02/09/2019  . Tricuspid regurgitation   . Type 2 diabetes mellitus with hyperglycemia, without long-term current use of insulin (HCC) 05/30/2019  . Type 2 diabetes mellitus, uncontrolled (HCC) 05/27/2019    Past Surgical History:  Procedure Laterality Date  . IR ANGIO VERTEBRAL SEL SUBCLAVIAN INNOMINATE UNI R MOD SED  05/19/2019  . IR CT HEAD LTD  05/19/2019  . IR PERCUTANEOUS ART THROMBECTOMY/INFUSION INTRACRANIAL INC DIAG ANGIO  05/19/2019  . RADIOLOGY WITH ANESTHESIA N/A 05/19/2019   Procedure: IR WITH ANESTHESIA;  Surgeon: Julieanne Cotton, MD;  Location: [facility] OR;  Service: Radiology;  Laterality: N/A;  . RIGHT/LEFT HEART CATH AND CORONARY ANGIOGRAPHY N/A 01/13/2019   Procedure: RIGHT/LEFT HEART CATH AND CORONARY ANGIOGRAPHY;  Surgeon: Runell Gess, MD;  Location: [facility] INVASIVE CV LAB;  Service: Cardiovascular;  Laterality: N/A;  . TUBAL LIGATION      Current Medications: Current Meds  Medication Sig  . albuterol (VENTOLIN HFA) 108 (90 Base) MCG/ACT inhaler Inhale 2 puffs into the lungs every 4 (four) hours as needed for shortness of breath.  . allopurinol (ZYLOPRIM) 100 MG tablet Take 100 mg by mouth 2 (two) times daily.  Marland Kitchen atorvastatin  (LIPITOR) 10 MG tablet Take 10 mg by mouth daily.  . carvedilol (COREG) 6.25 MG tablet TAKE 1 TABLET(6.25 MG) BY MOUTH TWICE DAILY WITH A MEAL  . famotidine (PEPCID) 40 MG tablet Take 40 mg by mouth at bedtime.   . fluticasone (FLONASE) 50 MCG/ACT nasal spray Place 2 sprays into both nostrils daily as needed for allergies or rhinitis.  . furosemide (LASIX) 40 MG tablet Take 40 mg by mouth daily.  Marland Kitchen levothyroxine (SYNTHROID) 50 MCG tablet Take 1 tablet (50 mcg total) by mouth daily before breakfast.  . loratadine (CLARITIN) 10 MG tablet Take 10 mg by mouth daily as needed for allergies or rhinitis.   Marland Kitchen montelukast (SINGULAIR) 10 MG tablet Take 10 mg by mouth at bedtime.  . potassium chloride (KLOR-CON) 10 MEQ tablet TAKE 1 TABLET(10 MEQ) BY MOUTH DAILY  . spironolactone (ALDACTONE) 25 MG tablet Take 0.5 tablets (12.5 mg total) by mouth daily.  . Vitamin D, Ergocalciferol, (DRISDOL) 1.25 MG (50000 UT) CAPS capsule Take 50,000 Units by mouth every Monday.   . warfarin (COUMADIN) 2.5 MG tablet TAKE 1 TABLET BY MOUTH DAILY EXCEPT 1/2 TABLET ON MONDAY, WEDNESDAY, FRIDAY OR AS DIRECTED     Allergies:   Lisinopril and Losartan   Social History   Socioeconomic History  . Marital status: Unknown    Spouse name: Not on file  . Number of children: Not on file  . Years of education: Not on file  . Highest education level: Not on file  Occupational History  . Not on file  Tobacco Use  . Smoking status: Never Smoker  . Smokeless tobacco: Never Used  Vaping Use  . Vaping Use: Never used  Substance and Sexual Activity  . Alcohol use: Never  . Drug use: Never  . Sexual activity: Not on file  Other Topics Concern  . Not on file  Social History Narrative  . Not on file   Social Determinants of Health   Financial Resource Strain: Not on file  Food Insecurity: Not on file  Transportation Needs: Not on file  Physical Activity: Not on file  Stress: Not on file  Social Connections: Not on file      Family History: The patient's family history includes CAD in her mother; Cancer in her brother; Colon cancer in her father; Heart failure in her mother; Hypertension in her mother. ROS:   Please see the history of present illness.    All 14 point review of systems negative except as described per history of present illness  EKGs/Labs/Other Studies Reviewed:      Recent Labs: 07/04/2020: NT-Pro BNP 7,165 08/08/2020: ALT 6; BNP 2,850.2; BUN 21; Creatinine, Ser 0.87; Hemoglobin 12.5; Platelets 316; Potassium 4.5; Sodium 140; TSH 2.960  Recent Lipid Panel    Component Value Date/Time   CHOL 68 (L) 09/12/2020 1347   TRIG 52 09/12/2020 1347   HDL 17 (L) 09/12/2020 1347   CHOLHDL 4.0 09/12/2020 1347   CHOLHDL 6.4 05/20/2019 0441   VLDL 18 05/20/2019 0441   LDLCALC 38 09/12/2020 1347    Physical Exam:    VS:  BP Marland Kitchen)  88/58 (BP Location: Left Arm, Patient Position: Sitting)   Pulse 82   Ht 5\' 8"  (1.727 m)   Wt 183 lb (83 kg)   SpO2 97%   BMI 27.83 kg/m     Wt Readings from Last 3 Encounters:  10/11/20 183 lb (83 kg)  09/12/20 200 lb (90.7 kg)  08/08/20 204 lb (92.5 kg)     GEN:  Well nourished, well developed in no acute distress HEENT: Normal NECK: No JVD; No carotid bruits LYMPHATICS: No lymphadenopathy CARDIAC: RRR, no murmurs, no rubs, no gallops RESPIRATORY:  Clear to auscultation without rales, wheezing or rhonchi  ABDOMEN: Soft, non-tender, non-distended MUSCULOSKELETAL:  No edema; No deformity  SKIN: Warm and dry LOWER EXTREMITIES: 2+ swelling, skin is very tense and thick.  Few months with the dressing NEUROLOGIC:  Alert and oriented x 3 PSYCHIATRIC:  Normal affect   ASSESSMENT:    1. Chronic systolic CHF (congestive heart failure) (HCC)   2. Open wound   3. Essential hypertension   4. LBBB (left bundle branch block)   5. Nonrheumatic mitral valve regurgitation   6. Cerebrovascular accident (CVA) due to embolism of right middle cerebral artery (HCC)     PLAN:    In order of problems listed above:  1. Chronic systolic congestive heart failure the biggest difficulty is her blood pressure being low I have no room to put her on any more medications.  I will check her kidney function today. 2. Open wound that being followed by wound clinic. 3. Essential hypertension we have opposite problem today with blood pressure being low. 4. Left bundle branch which is chronic.  She could benefit from ICD with BiV pacing but she refused any aggressive intervention. 5. Nonrheumatic mitral valve regurgitation which is secondary to her cardiomyopathy.  Medical management. 6. History of CVA that she required thrombectomy for it.  Doing well stable from that point review. 7. Overall he is still looks quite bad but overall better than I seen her last time.  We will check Chem-7 today.  I did review record from hospitalization from November of last year   Medication Adjustments/Labs and Tests Ordered: Current medicines are reviewed at length with the patient today.  Concerns regarding medicines are outlined above.  No orders of the defined types were placed in this encounter.  Medication changes: No orders of the defined types were placed in this encounter.   Signed, December, MD, [facility] 10/11/2020 4:24 PM    [facility]

## 2020-10-11 NOTE — Addendum Note (Signed)
Addended by: Hazle Quant on: 10/11/2020 04:31 PM   Modules accepted: Orders

## 2020-10-11 NOTE — Progress Notes (Addendum)
BOSTYN, KUNKLER ([id number]) Visit Report for 10/10/2020 Allergy List Details Patient Name: Breanna Bennett, Breanna Bennett Date of Service: 10/10/2020 2:00 PM Medical Record Number: [id number] Patient Account Number: [id number] Date of Birth/Sex: [birth year] (65 y.o. F) Treating RN: Huel Coventry Primary Care Luva Metzger: [facility] Other Clinician: Referring Nivia Gervase: [facility] Treating Elgar Scoggins/Extender: Maxwell Caul Weeks in Treatment: 0 Allergies Active Allergies No Known Drug Allergies Type: Allergen Allergy Notes Electronic Signature(s) Signed: 10/10/2020 9:26:32 PM By: Elliot Gurney, BSN, RN, CWS, Kim RN, BSN Entered By: Elliot Gurney, BSN, RN, CWS, Kim on 10/10/2020 15:03:10 Breanna Bennett ([id number]) -------------------------------------------------------------------------------- Arrival Information Details Patient Name: Breanna Bennett Date of Service: 10/10/2020 2:00 PM Medical Record Number: [id number] Patient Account Number: [id number] Date of Birth/Sex: 30-Apr-1956 (65 y.o. F) Treating RN: Yevonne Pax Primary Care Palyn Scrima: [facility] KIRSTEN Other Clinician: Referring Kaely Hollan: [facility]EN Treating Katesha Eichel/Extender: Altamese [place] in Treatment: 0 Visit Information Patient Arrived: Wheel Chair Arrival Time: 14:02 Accompanied By: daughter Transfer Assistance: None Patient Identification Verified: Yes Secondary Verification Process Completed: Yes Patient Requires Transmission-Based Precautions: No Patient Has Alerts: No Electronic Signature(s) Signed: 10/11/2020 9:40:15 AM By: Yevonne Pax RN Entered By: Yevonne Pax on 10/10/2020 14:11:22 Breanna Bennett ([id number]) -------------------------------------------------------------------------------- Clinic Level of Care Assessment Details Patient Name: Breanna Bennett Date of Service: 10/10/2020 2:00 PM Medical Record Number: [id number] Patient Account Number: [id number] Date of Birth/Sex: 09-07-1955 (65 y.o. F) Treating RN: Huel Coventry Primary Care Deidra Spease: [facility] Other Clinician: Referring Keeshawn Fakhouri: [facility] Treating Irianna Gilday/Extender: Altamese [place] in Treatment: 0 Clinic Level of Care Assessment Items TOOL 1 Quantity Score []  - Use when EandM and Procedure is performed on INITIAL visit 0 ASSESSMENTS - Nursing Assessment / Reassessment X - General Physical Exam (combine w/ comprehensive assessment (listed just below) when performed on new 1 20 pt. evals) X- 1 25 Comprehensive Assessment (HX, ROS, Risk Assessments, Wounds Hx, etc.) ASSESSMENTS - Wound and Skin Assessment / Reassessment []  - Dermatologic / Skin Assessment (not related to wound area) 0 ASSESSMENTS - Ostomy and/or Continence Assessment and Care []  - Incontinence Assessment and Management 0 []  - 0 Ostomy Care Assessment and Management (repouching, etc.) PROCESS - Coordination of Care X - Simple Patient / Family Education for ongoing care 1 15 []  - 0 Complex (extensive) Patient / Family Education for ongoing care X- 1 10 Staff obtains Chiropractor, Records, Test Results / Process Orders []  - 0 Staff telephones HHA, Nursing Homes / Clarify orders / etc []  - 0 Routine Transfer to another Facility (non-emergent condition) []  - 0 Routine Hospital Admission (non-emergent condition) X- 1 15 New Admissions / Manufacturing engineer / Ordering NPWT, Apligraf, etc. []  - 0 Emergency Hospital Admission (emergent condition) PROCESS - Special Needs []  - Pediatric / Minor Patient Management 0 []  - 0 Isolation Patient Management []  - 0 Hearing / Language / Visual special needs []  - 0 Assessment of Community assistance (transportation, D/C planning, etc.) []  - 0 Additional assistance / Altered mentation []  - 0 Support Surface(s) Assessment (bed, cushion, seat, etc.) INTERVENTIONS - Miscellaneous []  - External ear exam 0 []  - 0 Patient Transfer (multiple staff / Nurse, adult / Similar devices) []  - 0 Simple Staple / Suture removal  (25 or less) []  - 0 Complex Staple / Suture removal (26 or more) []  - 0 Hypo/Hyperglycemic Management (do not check if billed separately) X- 1 15 Ankle / Brachial Index (ABI) - do not check if billed separately Has the patient been seen at the hospital within the last three years:  Yes Total Score: 100 Level Of Care: New/Established - Level 3 [name], [name] ([id number]) Electronic Signature(s) Signed: 10/10/2020 9:26:32 PM By: Elliot Gurney, BSN, RN, CWS, Kim RN, BSN Entered By: Elliot Gurney, BSN, RN, CWS, Kim on 10/10/2020 20:57:37 Breanna Bennett ([id number]) -------------------------------------------------------------------------------- Lower Extremity Assessment Details Patient Name: Breanna Bennett Date of Service: 10/10/2020 2:00 PM Medical Record Number: [id number] Patient Account Number: [id number] Date of Birth/Sex: 08-Jun-1956 (65 y.o. F) Treating RN: Yevonne Pax Primary Care Cathlyn Tersigni: [facility] Other Clinician: Referring Jobany Montellano: [facility]EN Treating Zuleyka Kloc/Extender: Maxwell Caul Weeks in Treatment: 0 Edema Assessment Assessed: [Left: No] [Right: No] [Left: Edema] [Right: :] Calf Left: Right: Point of Measurement: 40 cm From Medial Instep 41 cm 41 cm Ankle Left: Right: Point of Measurement: 10 cm From Medial Instep 21 cm 23 cm Knee To Floor Left: Right: From Medial Instep 48 cm 48 cm Vascular Assessment Pulses: Dorsalis Pedis Palpable: [Left:Yes] [Right:Yes] Blood Pressure: Brachial: [Left:98] [Right:98] Ankle: [Left:Dorsalis Pedis: 120 1.22] [Right:Dorsalis Pedis: 132 1.35] Electronic Signature(s) Signed: 10/11/2020 9:40:15 AM By: Yevonne Pax RN Entered By: Yevonne Pax on 10/10/2020 14:57:06 Breanna Bennett ([id number]) -------------------------------------------------------------------------------- Multi Wound Chart Details Patient Name: Breanna Bennett Date of Service: 10/10/2020 2:00 PM Medical Record Number: [id number] Patient Account Number:  [id number] Date of Birth/Sex: Oct 26, 1955 (65 y.o. 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 Extremity Extremity Comorbid History: Congestive Heart Failure, Congestive Heart Failure, Congestive Heart Failure, Hypertension, Type II Diabetes Hypertension, Type II Diabetes Hypertension, Type II Diabetes Date Acquired: 08/26/2019 08/26/2019 08/26/2019 Weeks of Treatment: 0 0 0 Wound Status: Open Open Open Measurements L x W x D (cm) 0.4x0.5x0.2 2.9x3.1x0.6 2.8x2.6x0.4 Area (cm) : 0.157 7.061 5.718 Volume (cm) : 0.031 4.236 2.287 % Reduction in Area: N/A N/A N/A % Reduction in Volume: N/A N/A N/A Classification: Grade 2 Grade 2 Grade 2 Exudate Amount: Medium Medium Medium Exudate Type: Serosanguineous Serosanguineous Serosanguineous Exudate Color: red, brown red, brown red, brown Wound Margin: N/A N/A N/A Granulation Amount: Medium (34-66%) Small (1-33%) Small (1-33%) Granulation Quality: Red, Pink Pink, Pale Pink, Pale Necrotic Amount: Medium (34-66%) Large (67-100%) Large (67-100%) Necrotic Tissue: Adherent Slough Adherent Slough Eschar, Adherent Slough Exposed Structures: Fascia: No Fat Layer (Subcutaneous Tissue): Fat Layer (Subcutaneous Tissue): Fat Layer (Subcutaneous Tissue): Yes Yes No Fascia: No Fascia: No Tendon: No Tendon: No Tendon: No Muscle: No Muscle: No Muscle: No Joint:  No Joint: No Joint: No Bone: No Bone: No Bone: No Epithelialization: None None None Wound Number: 4 5 6  Photos: No Photos No Photos Kegg, Kingslee ( ) Wound Location: Left Lower Leg Right, Medial Lower Leg Left, Medial Lower Leg Wounding Event: Gradually Appeared Gradually Appeared Gradually Appeared 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 Extremity Extremity Comorbid History: Congestive Heart Failure, Congestive Heart Failure, Congestive Heart Failure, Hypertension, Type II Diabetes Hypertension, Type II Diabetes Hypertension, Type II Diabetes Date Acquired: 08/26/2019 02/07/2020 02/07/2020 Weeks of Treatment: 0 0 0 Wound Status: Open Open Open Measurements L x W x D (cm) 4x2.4x0.3 5.5x3.5x0.2 0.5x0.5x0.3  Area (cm) : 7.54 15.119 0.196 Volume (cm) : 2.262 3.024 0.059 % Reduction in Area: N/A 0.00% 0.00% % Reduction in Volume: N/A 0.00% 0.00% Classification: Grade 2 Grade 2 Grade 2 Exudate Amount: Medium Large Medium Exudate Type: Serosanguineous Serous Serous Exudate Color: red, brown amber amber Wound Margin: N/A Flat and Intact N/A Granulation Amount: Small (1-33%) Medium (34-66%) Medium (34-66%) Granulation Quality: Pink, Pale Red Pink Necrotic Amount: Large (67-100%) Medium (34-66%) Medium (34-66%) Necrotic Tissue: Adherent Slough Adherent [employer] Exposed Structures: Fat Layer (Subcutaneous Tissue): Fat Layer (Subcutaneous Tissue): Fat Layer (Subcutaneous Tissue): Yes Yes Yes Fascia: No Fascia: No Fascia: No Tendon: No Tendon: No Tendon: No Muscle: No Muscle: No Muscle: No Joint: No Joint: No Joint: No Bone: No Bone: No Bone: No Epithelialization: None None N/A Wound Number: 7 N/A N/A Photos: No Photos N/A N/A Wound Location: Left, Posterior Lower Leg N/A N/A Wounding Event: Gradually Appeared N/A N/A Primary Etiology: Diabetic Wound/Ulcer of  the Lower N/A N/A Extremity Comorbid History: Congestive Heart Failure, N/A N/A Hypertension, Type II Diabetes Date Acquired: 02/07/2020 N/A N/A Weeks of Treatment: 0 N/A N/A Wound Status: Open N/A N/A Measurements L x W x D (cm) 2.2x0.5x0.3 N/A N/A Area (cm) : 0.864 N/A N/A Volume (cm) : 0.259 N/A N/A % Reduction in Area: 0.00% N/A N/A % Reduction in Volume: 0.00% N/A N/A Classification: Grade 2 N/A N/A Exudate Amount: Large N/A N/A Exudate Type: Serous N/A N/A Exudate Color: amber N/A N/A Wound Margin: N/A N/A N/A Granulation Amount: Medium (34-66%) N/A N/A Granulation Quality: Pink N/A N/A Necrotic Amount: Medium (34-66%) N/A N/A Necrotic Tissue: Adherent Slough N/A N/A Exposed Structures: Fat Layer (Subcutaneous Tissue): N/A N/A Yes Fascia: No Tendon: No Muscle: No Joint: No Bone: No Epithelialization: None N/A N/A Treatment Notes Electronic Signature(s) Signed: 10/11/2020 12:09:37 PM By: Baltazar Najjar MD Capron, [name] ([id number]) Entered By: Baltazar Najjar on 10/10/2020 15:40:09 Breanna Bennett ([id number]) -------------------------------------------------------------------------------- Multi-Disciplinary Care Plan Details Patient Name: Breanna Bennett Date of Service: 10/10/2020 2:00 PM Medical Record Number: [id number] Patient Account Number: [id number] Date of Birth/Sex: 01-Feb-1956 (65 y.o. F) Treating RN: Huel Coventry Primary Care Brinda Focht: Blane Ohara Other Clinician: Referring Glendola Friedhoff: Blane Ohara Treating Fey Coghill/Extender: Altamese [place] in Treatment: 0 Active Inactive Electronic Signature(s) Signed: 11/02/2020 12:00:03 PM By: Elliot Gurney, BSN, RN, CWS, Kim RN, BSN Previous Signature: 10/10/2020 9:26:32 PM Version By: Elliot Gurney BSN, RN, CWS, Kim RN, BSN Entered By: Elliot Gurney, BSN, RN, CWS, Kim on 11/02/2020 12:00:02 Breanna Bennett ([id number]) -------------------------------------------------------------------------------- Pain Assessment  Details Patient Name: Breanna Bennett Date of Service: 10/10/2020 2:00 PM Medical Record Number: [id number] Patient Account Number: [id number] Date of Birth/Sex: 1955-12-24 (65 y.o. F) Treating RN: Yevonne Pax Primary Care Solveig Fangman: [facility] K[facility]EN Other Clinician: Referring Trent Gabler: [facility] KIRSTEN Treating Cerys Winget/Extender: Maxwell Caul Weeks in Treatment: 0 Active Problems Location of Pain Severity and Description of Pain Patient Has Paino Yes Site Locations With Dressing Change: Yes Duration of the Pain. Constant / Intermittento Intermittent How Long Does it Lasto Hours: Minutes: 10 Rate the pain. Current Pain Level: 6 Worst Pain Level: 8 Least Pain Level: 0 Tolerable Pain Level: 5 Character of Pain Describe the Pain: Burning Pain Management and Medication Current Pain Management: Medication: No Cold Application: No Rest: Yes Massage: No Activity: No T.E.N.S.: No Heat Application: No Leg drop or elevation: No Is the Current Pain Management Adequate: Inadequate How does your wound impact your activities of daily livingo Sleep: Yes Bathing: No Appetite: Yes Relationship With Others: No Bladder  Continence: No Emotions: No Bowel Continence: No Work: No Toileting: No Drive: No Dressing: No Hobbies: No Electronic Signature(s) Signed: 10/11/2020 9:40:15 AM By: Yevonne Pax RN Entered By: Yevonne Pax on 10/10/2020 14:13:25 Breanna Bennett ([id number]) -------------------------------------------------------------------------------- Patient/Caregiver Education Details Patient Name: Breanna Bennett Date of Service: 10/10/2020 2:00 PM Medical Record Number: [id number] Patient Account Number: [id number] Date of Birth/Gender: Jan 17, 1956 (65 y.o. F) Treating RN: Huel Coventry Primary Care Physician: Blane Ohara Other Clinician: Referring Physician: [facility], KIRSTEN Treating Physician/Extender: Altamese [place] in Treatment: 0 Education Assessment Education  Provided To: Patient Education Topics Provided Elevated Blood Sugar/ Impact on Healing: Handouts: Elevated Blood Sugars: How Do They Affect Wound Healing Methods: Demonstration, Explain/Verbal Responses: State content correctly [facility]: Handouts: [facility] Methods: Demonstration, Explain/Verbal Responses: State content correctly Wound Debridement: Handouts: Wound Debridement Methods: Demonstration, Explain/Verbal Responses: State content correctly Wound/Skin Impairment: Handouts: Caring for Your Ulcer Methods: Demonstration, Explain/Verbal Responses: State content correctly Electronic Signature(s) Signed: 10/10/2020 9:26:32 PM By: Elliot Gurney, BSN, RN, CWS, Kim RN, BSN Entered By: Elliot Gurney, BSN, RN, CWS, Kim on 10/10/2020 20:58:53 Breanna Bennett ([id number]) -------------------------------------------------------------------------------- Wound Assessment Details Patient Name: Breanna Bennett Date of Service: 10/10/2020 2:00 PM Medical Record Number: [id number] Patient Account Number: [id number] Date of Birth/Sex: November 15, 1955 (65 y.o. F) Treating RN: Yevonne Pax Primary Care Lanore Renderos: [facility] KIRSTEN Other Clinician: Referring Caylyn Tedeschi: [facility], KIRSTEN Treating Jorge Amparo/Extender: Maxwell Caul Weeks in Treatment: 0 Wound Status Wound Number: 1 Primary Etiology: Diabetic Wound/Ulcer of the Lower Extremity Wound Location: Right, Proximal Lower Leg Wound Status: Open Wounding Event: Gradually Appeared Comorbid Congestive Heart Failure, Hypertension, Type II History: Diabetes Date Acquired: 08/26/2019 Weeks Of Treatment: 0 Clustered Wound: No Photos Wound Measurements Length: (cm) 0.4 Width: (cm) 0.5 Depth: (cm) 0.2 Area: (cm) 0.157 Volume: (cm) 0.031 % Reduction in Area: 0% % Reduction in Volume: 0% Epithelialization: None Tunneling: No Undermining: No Wound Description Classification: Grade 2 Exudate Amount: Large Exudate  Type: Serous Exudate Color: amber Foul Odor After Cleansing: No Slough/Fibrino Yes Wound Bed Granulation Amount: Medium (34-66%) Exposed Structure Granulation Quality: Red, Pink Fascia Exposed: No Necrotic Amount: Medium (34-66%) Fat Layer (Subcutaneous Tissue) Exposed: No Necrotic Quality: Adherent Slough Tendon Exposed: No Muscle Exposed: No Joint Exposed: No Bone Exposed: No Electronic Signature(s) Signed: 10/10/2020 8:34:25 PM By: Elliot Gurney, BSN, RN, CWS, Kim RN, BSN Signed: 10/11/2020 9:40:15 AM By: Yevonne Pax RN Entered By: Elliot Gurney, BSN, RN, CWS, Kim on 10/10/2020 20:34:25 Breanna Bennett ([id number]) -------------------------------------------------------------------------------- Wound Assessment Details Patient Name: Breanna Bennett Date of Service: 10/10/2020 2:00 PM Medical Record Number: [id number] Patient Account Number: [id number] Date of Birth/Sex: [birth year] (65 y.o. F) Treating RN: Yevonne Pax Primary Care Katye Valek: [facility], KIRSTEN Other Clinician: Referring Nevaeha Finerty: [facility], KIRSTEN Treating Goldia Ligman/Extender: Maxwell Caul Weeks in Treatment: 0 Wound Status Wound Number: 2 Primary Etiology: Diabetic Wound/Ulcer of the Lower Extremity Wound Location: Right Lower Leg Wound Status: Open Wounding Event: Gradually Appeared Comorbid Congestive Heart Failure, Hypertension, Type II History: Diabetes Date Acquired: 08/26/2019 Weeks Of Treatment: 0 Clustered Wound: No Photos Wound Measurements Length: (cm) 2.9 Width: (cm) 3.1 Depth: (cm) 0.6 Area: (cm) 7.061 Volume: (cm) 4.236 % Reduction in Area: 0% % Reduction in Volume: 0% Epithelialization: None Tunneling: No Undermining: No Wound Description Classification: Grade 2 Exudate Amount: Large Exudate Type: Serous Exudate Color: amber Foul Odor After Cleansing: No Slough/Fibrino Yes Wound Bed Granulation Amount: Small (1-33%) Exposed Structure Granulation Quality: Pink, Pale Fascia Exposed: No Necrotic  Amount: Large (67-100%) Fat Layer (  Subcutaneous Tissue) Exposed: Yes Necrotic Quality: Adherent Slough Tendon Exposed: No Muscle Exposed: No Joint Exposed: No Bone Exposed: No Electronic Signature(s) Signed: 10/10/2020 8:34:57 PM By: Elliot Gurney, BSN, RN, CWS, Kim RN, BSN Signed: 10/11/2020 9:40:15 AM By: Yevonne Pax RN Entered By: Elliot Gurney, BSN, RN, CWS, Kim on 10/10/2020 20:34:57 Breanna Bennett ([id number]) -------------------------------------------------------------------------------- Wound Assessment Details Patient Name: Breanna Bennett Date of Service: 10/10/2020 2:00 PM Medical Record Number: [id number] Patient Account Number: [id number] Date of Birth/Sex: Jul 28, 1956 (65 y.o. F) Treating RN: Yevonne Pax Primary Care Hser Belanger: [facility], KIRSTEN Other Clinician: Referring Juline Sanderford: [facility], KIRSTEN Treating Tsugio Elison/Extender: Maxwell Caul Weeks in Treatment: 0 Wound Status Wound Number: 3 Primary Etiology: Diabetic Wound/Ulcer of the Lower Extremity Wound Location: Right, Distal Lower Leg Wound Status: Open Wounding Event: Gradually Appeared Comorbid Congestive Heart Failure, Hypertension, Type II History: Diabetes Date Acquired: 08/26/2019 Weeks Of Treatment: 0 Clustered Wound: No Photos Wound Measurements Length: (cm) 2.8 Width: (cm) 2.6 Depth: (cm) 0.4 Area: (cm) 5.718 Volume: (cm) 2.287 % Reduction in Area: 0% % Reduction in Volume: 0% Epithelialization: None Tunneling: No Undermining: No Wound Description Classification: Grade 2 Exudate Amount: Large Exudate Type: Serous Exudate Color: amber Foul Odor After Cleansing: No Slough/Fibrino Yes Wound Bed Granulation Amount: Small (1-33%) Exposed Structure Granulation Quality: Pink, Pale Fascia Exposed: No Necrotic Amount: Large (67-100%) Fat Layer (Subcutaneous Tissue) Exposed: Yes Necrotic Quality: Eschar, Adherent Slough Tendon Exposed: No Muscle Exposed: No Joint Exposed: No Bone Exposed: No Electronic  Signature(s) Signed: 10/10/2020 8:35:22 PM By: Elliot Gurney, BSN, RN, CWS, Kim RN, BSN Signed: 10/11/2020 9:40:15 AM By: Yevonne Pax RN Entered By: Elliot Gurney, BSN, RN, CWS, Kim on 10/10/2020 20:35:22 Breanna Bennett ([id number]) -------------------------------------------------------------------------------- Wound Assessment Details Patient Name: Breanna Bennett Date of Service: 10/10/2020 2:00 PM Medical Record Number: [id number] Patient Account Number: [id number] Date of Birth/Sex: Dec 31, 1955 (65 y.o. F) Treating RN: Yevonne Pax Primary Care Jacory Kamel: [facility], KIRSTEN Other Clinician: Referring Brenly Trawick: [facility], KIRSTEN Treating Dezmond Downie/Extender: Maxwell Caul Weeks in Treatment: 0 Wound Status Wound Number: 4 Primary Etiology: Diabetic Wound/Ulcer of the Lower Extremity Wound Location: Left Lower Leg Wound Status: Open Wounding Event: Gradually Appeared Comorbid Congestive Heart Failure, Hypertension, Type II History: Diabetes Date Acquired: 08/26/2019 Weeks Of Treatment: 0 Clustered Wound: No Photos Wound Measurements Length: (cm) 4 Width: (cm) 2.4 Depth: (cm) 0.3 Area: (cm) 7.54 Volume: (cm) 2.262 % Reduction in Area: 0% % Reduction in Volume: 0% Epithelialization: None Tunneling: No Undermining: No Wound Description Classification: Grade 2 Exudate Amount: Large Exudate Type: Serous Exudate Color: amber Foul Odor After Cleansing: No Slough/Fibrino Yes Wound Bed Granulation Amount: Small (1-33%) Exposed Structure Granulation Quality: Pink, Pale Fascia Exposed: No Necrotic Amount: Large (67-100%) Fat Layer (Subcutaneous Tissue) Exposed: Yes Necrotic Quality: Adherent Slough Tendon Exposed: No Muscle Exposed: No Joint Exposed: No Bone Exposed: No Electronic Signature(s) Signed: 10/10/2020 8:35:47 PM By: Elliot Gurney, BSN, RN, CWS, Kim RN, BSN Signed: 10/11/2020 9:40:15 AM By: Yevonne Pax RN Entered By: Elliot Gurney, BSN, RN, CWS, Kim on 10/10/2020 20:35:47 Breanna Bennett  ([id number]) -------------------------------------------------------------------------------- Wound Assessment Details Patient Name: Breanna Bennett Date of Service: 10/10/2020 2:00 PM Medical Record Number: [id number] Patient Account Number: [id number] Date of Birth/Sex: 1955/12/21 (65 y.o. F) Treating RN: Huel Coventry Primary Care Denzil Bristol: [facility], KIRSTEN Other Clinician: Referring Arlyce Circle: [facility], KIRSTEN Treating Omari Koslosky/Extender: Maxwell Caul Weeks in Treatment: 0 Wound Status Wound Number: 5 Primary Etiology: Diabetic Wound/Ulcer of the Lower Extremity Wound Location: Right, Medial Lower Leg Wound Status: Open Wounding Event: Gradually Appeared Comorbid Congestive Heart Failure, Hypertension, Type  II History: Diabetes Date Acquired: 02/07/2020 Weeks Of Treatment: 0 Clustered Wound: No Wound Measurements Length: (cm) 5.5 Width: (cm) 3.5 Depth: (cm) 0.2 Area: (cm) 15.119 Volume: (cm) 3.024 % Reduction in Area: 0% % Reduction in Volume: 0% Epithelialization: None Tunneling: No Undermining: No Wound Description Classification: Grade 2 Wound Margin: Flat and Intact Exudate Amount: Large Exudate Type: Serous Exudate Color: amber Slough/Fibrino Yes Wound Bed Granulation Amount: Medium (34-66%) Exposed Structure Granulation Quality: Red Fascia Exposed: No Necrotic Amount: Medium (34-66%) Fat Layer (Subcutaneous Tissue) Exposed: Yes Necrotic Quality: Adherent Slough Tendon Exposed: No Muscle Exposed: No Joint Exposed: No Bone Exposed: No Electronic Signature(s) Signed: 10/10/2020 9:26:32 PM By: Elliot Gurney, BSN, RN, CWS, Kim RN, BSN Entered By: Elliot Gurney, BSN, RN, CWS, Kim on 10/10/2020 15:33:55 Breanna Bennett ([id number]) -------------------------------------------------------------------------------- Wound Assessment Details Patient Name: Breanna Bennett Date of Service: 10/10/2020 2:00 PM Medical Record Number: [id number] Patient Account Number: [id number] Date of  Birth/Sex: 02-02-1956 (65 y.o. F) Treating RN: Huel Coventry Primary Care Kylena Mole: [facility], KIRSTEN Other Clinician: Referring Josep Luviano: [facility], KIRSTEN Treating Nicholous Girgenti/Extender: Maxwell Caul Weeks in Treatment: 0 Wound Status Wound Number: 6 Primary Etiology: Diabetic Wound/Ulcer of the Lower Extremity Wound Location: Left, Medial Lower Leg Wound Status: Open Wounding Event: Gradually Appeared Comorbid Congestive Heart Failure, Hypertension, Type II History: Diabetes Date Acquired: 02/07/2020 Weeks Of Treatment: 0 Clustered Wound: No Wound Measurements Length: (cm) 0.5 Width: (cm) 0.5 Depth: (cm) 0.3 Area: (cm) 0.196 Volume: (cm) 0.059 % Reduction in Area: 0% % Reduction in Volume: 0% Wound Description Classification: Grade 2 Exudate Amount: Medium Exudate Type: Serous Exudate Color: amber Foul Odor After Cleansing: No Slough/Fibrino Yes Wound Bed Granulation Amount: Medium (34-66%) Exposed Structure Granulation Quality: Pink Fascia Exposed: No Necrotic Amount: Medium (34-66%) Fat Layer (Subcutaneous Tissue) Exposed: Yes Necrotic Quality: Adherent Slough Tendon Exposed: No Muscle Exposed: No Joint Exposed: No Bone Exposed: No Electronic Signature(s) Signed: 10/10/2020 9:26:32 PM By: Elliot Gurney, BSN, RN, CWS, Kim RN, BSN Entered By: Elliot Gurney, BSN, RN, CWS, Kim on 10/10/2020 15:34:54 Breanna Bennett ([id number]) -------------------------------------------------------------------------------- Wound Assessment Details Patient Name: Breanna Bennett Date of Service: 10/10/2020 2:00 PM Medical Record Number: [id number] Patient Account Number: [id number] Date of Birth/Sex: 1956-03-14 (65 y.o. F) Treating RN: Huel Coventry Primary Care Juliocesar Blasius: COX, KIRSTEN Other Clinician: Referring Zeriyah Wain: COX, KIRSTEN Treating Myrta Mercer/Extender: Maxwell Caul Weeks in Treatment: 0 Wound Status Wound Number: 7 Primary Etiology: Diabetic Wound/Ulcer of the Lower Extremity Wound Location:  Left, Posterior Lower Leg Wound Status: Open Wounding Event: Gradually Appeared Comorbid Congestive Heart Failure, Hypertension, Type II History: Diabetes Date Acquired: 02/07/2020 Weeks Of Treatment: 0 Clustered Wound: No Wound Measurements Length: (cm) 2.2 Width: (cm) 0.5 Depth: (cm) 0.3 Area: (cm) 0.864 Volume: (cm) 0.259 % Reduction in Area: 0% % Reduction in Volume: 0% Epithelialization: None Tunneling: No Undermining: No Wound Description Classification: Grade 2 Exudate Amount: Large Exudate Type: Serous Exudate Color: amber Wound Bed Granulation Amount: Medium (34-66%) Exposed Structure Granulation Quality: Pink Fascia Exposed: No Necrotic Amount: Medium (34-66%) Fat Layer (Subcutaneous Tissue) Exposed: Yes Necrotic Quality: Adherent Slough Tendon Exposed: No Muscle Exposed: No Joint Exposed: No Bone Exposed: No Electronic Signature(s) Signed: 10/10/2020 9:26:32 PM By: Elliot Gurney, BSN, RN, CWS, Kim RN, BSN Entered By: Elliot Gurney, BSN, RN, CWS, Kim on 10/10/2020 15:35:27 Breanna Bennett ([id number]) -------------------------------------------------------------------------------- Vitals Details Patient Name: Breanna Bennett Date of Service: 10/10/2020 2:00 PM Medical Record Number: [id number] Patient Account Number: [id number] Date of Birth/Sex: 1956/06/23 (65 y.o. F) Treating RN: Yevonne Pax Primary Care Ajahnae Rathgeber: Blane Ohara Other Clinician:  Referring Linzie Boursiquot: COX, KIRSTEN Treating Horald Birky/Extender: Maxwell CaulOBSON, MICHAEL G Weeks in Treatment: 0 Vital Signs Time Taken: 14:13 Temperature (F): 98.2 Height (in): 68 Pulse (bpm): 77 Source: Stated Respiratory Rate (breaths/min): 18 Weight (lbs): 160 Blood Pressure (mmHg): 98/80 Source: Stated Reference Range: 80 - 120 mg / dl Body Mass Index (BMI): 24.3 Electronic Signature(s) Signed: 10/11/2020 9:40:15 AM By: Yevonne PaxEpps, Carrie RN Entered By: Yevonne PaxEpps, Carrie on 10/10/2020 14:14:13

## 2020-10-11 NOTE — Patient Instructions (Signed)

## 2020-10-11 NOTE — Progress Notes (Signed)
EMMORY, SOLIVAN ([id number]) Visit Report for 10/10/2020 Abuse/Suicide Risk Screen Details Patient Name: Breanna Bennett, Breanna Bennett Date of Service: 10/10/2020 2:00 PM Medical Record Number: [id number] Patient Account Number: [id number] Date of Birth/Sex: [birth year] (65 y.o. F) Treating RN: Yevonne Pax Primary Care Ashlin Kreps: COX, KIRSTEN Other Clinician: Referring Alessandro Griep: COX, KIRSTEN Treating Avyon Herendeen/Extender: Maxwell Caul Weeks in Treatment: 0 Abuse/Suicide Risk Screen Items Answer ABUSE RISK SCREEN: Has anyone close to you tried to hurt or harm you recentlyo No Do you feel uncomfortable with anyone in your familyo No Has anyone forced you do things that you didnot want to doo No Electronic Signature(s) Signed: 10/11/2020 9:40:15 AM By: Yevonne Pax RN Entered By: Yevonne Pax on 10/10/2020 14:42:28 Breanna Bennett ([id number]) -------------------------------------------------------------------------------- Activities of Daily Living Details Patient Name: Breanna Bennett Date of Service: 10/10/2020 2:00 PM Medical Record Number: [id number] Patient Account Number: [id number] Date of Birth/Sex: 1956/01/13 (64 y.o. F) Treating RN: Yevonne Pax Primary Care Toluwanimi Radebaugh: COX, KIRSTEN Other Clinician: Referring Mally Gavina: COX, KIRSTEN Treating Valon Glasscock/Extender: Maxwell Caul Weeks in Treatment: 0 Activities of Daily Living Items Answer Activities of Daily Living (Please select one for each item) Drive Automobile Not Able Take Medications Need Assistance Use Telephone Completely Able Care for Appearance Need Assistance Use Toilet Need Assistance Bath / Shower Need Assistance Dress Self Need Assistance Feed Self Need Assistance Walk Completely Able Get In / Out Bed Need Assistance Housework Need Assistance Prepare Meals Not Able Handle Money Need Assistance Shop for Self Need Assistance Electronic Signature(s) Signed: 10/11/2020 9:40:15 AM By: Yevonne Pax RN Entered By: Yevonne Pax on 10/10/2020 14:43:27 Breanna Bennett ([id number]) -------------------------------------------------------------------------------- Education Screening Details Patient Name: Breanna Bennett Date of Service: 10/10/2020 2:00 PM Medical Record Number: [id number] Patient Account Number: [id number] Date of Birth/Sex: 1955-12-29 (64 y.o. F) Treating RN: Yevonne Pax Primary Care Loranzo Desha: COX, KIRSTEN Other Clinician: Referring Kassi Esteve: COX, KIRSTEN Treating Neils Siracusa/Extender: Altamese [place] in Treatment: 0 Learning Preferences/Education Level/Primary Language Learning Preference: Explanation Highest Education Level: Grade School Preferred Language: English Cognitive Barrier Language Barrier: No Translator Needed: No Memory Deficit: No Emotional Barrier: No Cultural/Religious Beliefs Affecting Medical Care: No Physical Barrier Impaired Vision: No Impaired Hearing: No Decreased Hand dexterity: No Knowledge/Comprehension Knowledge Level: Medium Comprehension Level: High Ability to understand written instructions: High Ability to understand verbal instructions: High Motivation Anxiety Level: Anxious Cooperation: Cooperative Education Importance: Acknowledges Need Interest in Health Problems: Asks Questions Perception: Coherent Willingness to Engage in Self-Management Medium Activities: Readiness to Engage in Self-Management Medium Activities: Electronic Signature(s) Signed: 10/11/2020 9:40:15 AM By: Yevonne Pax RN Entered By: Yevonne Pax on 10/10/2020 14:44:37 Breanna Bennett ([id number]) -------------------------------------------------------------------------------- Fall Risk Assessment Details Patient Name: Breanna Bennett Date of Service: 10/10/2020 2:00 PM Medical Record Number: [id number] Patient Account Number: [id number] Date of Birth/Sex: 1955-12-06 (64 y.o. F) Treating RN: Yevonne Pax Primary Care Devere Brem: COX, KIRSTEN Other  Clinician: Referring Durrel Mcnee: COX, KIRSTEN Treating Kaheem Halleck/Extender: Altamese [place] in Treatment: 0 Fall Risk Assessment Items Have you had 2 or more falls in the last 12 monthso 0 No Have you had any fall that resulted in injury in the last 12 monthso 0 No FALLS RISK SCREEN History of falling - immediate or within 3 months 0 No Secondary diagnosis (Do you have 2 or more medical diagnoseso) 0 No Ambulatory aid None/bed rest/wheelchair/nurse 0 No Crutches/cane/walker 0 No Furniture 0 No Intravenous therapy Access/Saline/Heparin Lock 0 No Gait/Transferring Normal/ bed rest/ wheelchair 0 No Weak (short steps with or without shuffle, stooped but able to lift head  while walking, may 0 No seek support from furniture) Impaired (short steps with shuffle, may have difficulty arising from chair, head down, impaired 0 No balance) Mental Status Oriented to own ability 0 No Electronic Signature(s) Signed: 10/11/2020 9:40:15 AM By: Yevonne Pax RN Entered By: Yevonne Pax on 10/10/2020 14:45:01 Breanna Bennett ([id number]) -------------------------------------------------------------------------------- Foot Assessment Details Patient Name: Breanna Bennett Date of Service: 10/10/2020 2:00 PM Medical Record Number: [id number] Patient Account Number: [id number] Date of Birth/Sex: 1956-02-02 (64 y.o. F) Treating RN: Yevonne Pax Primary Care Evelyne Makepeace: COX, KIRSTEN Other Clinician: Referring Monico Sudduth: COX, KIRSTEN Treating Chonita Gadea/Extender: Maxwell Caul Weeks in Treatment: 0 Foot Assessment Items Site Locations + = Sensation present, - = Sensation absent, C = Callus, U = Ulcer R = Redness, W = Warmth, M = Maceration, PU = Pre-ulcerative lesion F = Fissure, S = Swelling, D = Dryness Assessment Right: Left: Other Deformity: No No Prior Foot Ulcer: No No Prior Amputation: No No Charcot Joint: No No Ambulatory Status: Ambulatory With Help Assistance Device:  Wheelchair Gait: Steady Electronic Signature(s) Signed: 10/11/2020 9:40:15 AM By: Yevonne Pax RN Entered By: Yevonne Pax on 10/10/2020 14:48:21 Breanna Bennett ([id number]) -------------------------------------------------------------------------------- Nutrition Risk Screening Details Patient Name: Breanna Bennett Date of Service: 10/10/2020 2:00 PM Medical Record Number: [id number] Patient Account Number: [id number] Date of Birth/Sex: August 13, 1956 (64 y.o. F) Treating RN: Yevonne Pax Primary Care Buzz Axel: COX, KIRSTEN Other Clinician: Referring Niv Darley: COX, KIRSTEN Treating Emon Lance/Extender: Maxwell Caul Weeks in Treatment: 0 Height (in): 68 Weight (lbs): 160 Body Mass Index (BMI): 24.3 Nutrition Risk Screening Items Score Screening NUTRITION RISK SCREEN: I have an illness or condition that made me change the kind and/or amount of food I eat 0 No I eat fewer than two meals per day 0 No I eat few fruits and vegetables, or milk products 0 No I have three or more drinks of beer, liquor or wine almost every day 0 No I have tooth or mouth problems that make it hard for me to eat 0 No I don't always have enough money to buy the food I need 0 No I eat alone most of the time 0 No I take three or more different prescribed or over-the-counter drugs a day 1 Yes Without wanting to, I have lost or gained 10 pounds in the last six months 0 No I am not always physically able to shop, cook and/or feed myself 2 Yes Nutrition Protocols Good Risk Protocol Moderate Risk Protocol 0 Provide education on nutrition High Risk Proctocol Risk Level: Moderate Risk Score: 3 Electronic Signature(s) Signed: 10/11/2020 9:40:15 AM By: Yevonne Pax RN Entered By: Yevonne Pax on 10/10/2020 14:45:31

## 2020-10-12 LAB — BASIC METABOLIC PANEL
BUN/Creatinine Ratio: 18 (ref 12–28)
BUN: 17 mg/dL (ref 8–27)
CO2: 20 mmol/L (ref 20–29)
Calcium: 8.5 mg/dL — ABNORMAL LOW (ref 8.7–10.3)
Chloride: 102 mmol/L (ref 96–106)
Creatinine, Ser: 0.93 mg/dL (ref 0.57–1.00)
GFR calc Af Amer: 75 mL/min/{1.73_m2} (ref >59)
GFR calc non Af Amer: 65 mL/min/{1.73_m2} (ref >59)
Glucose: 99 mg/dL (ref 65–99)
Potassium: 5 mmol/L (ref 3.5–5.2)
Sodium: 135 mmol/L (ref 134–144)

## 2020-10-17 ENCOUNTER — Ambulatory Visit: Payer: 59 | Admitting: Internal Medicine

## 2020-10-24 ENCOUNTER — Ambulatory Visit: Payer: 59 | Admitting: Internal Medicine

## 2020-10-30 ENCOUNTER — Other Ambulatory Visit: Payer: Self-pay | Admitting: Cardiology

## 2020-10-30 ENCOUNTER — Telehealth: Payer: Self-pay | Admitting: Cardiology

## 2020-10-30 MED ORDER — CARVEDILOL 6.25 MG PO TABS: 6.2500 mg | ORAL_TABLET | Freq: Two times a day (BID) | ORAL | 3 refills | Status: DC

## 2020-10-30 NOTE — Telephone Encounter (Signed)
Refill sent in per request.  

## 2020-10-30 NOTE — Telephone Encounter (Signed)
*  STAT* If patient is at the pharmacy, call can be transferred to refill team.   1. Which medications need to be refilled? (please list name of each medication and dose if known)  carvedilol (COREG) 6.25 MG tablet  2. Which pharmacy/location (including street and city if local pharmacy) is medication to be sent to? [id number] - [place], [place] - [address]  3. Do they need a 30 day or 90 day supply? 180

## 2020-11-07 ENCOUNTER — Telehealth: Payer: Self-pay

## 2020-11-07 ENCOUNTER — Encounter: Payer: Self-pay | Admitting: Family Medicine

## 2020-11-07 NOTE — Telephone Encounter (Signed)
      I reached out to patient today in regards to health maintenance items due.  Unfortunately the phone was busy and I was unable to leave a message.  Letter sent.  Creola Corn, LPN 83/33/83 2:91 PM

## 2020-11-12 ENCOUNTER — Other Ambulatory Visit: Payer: Self-pay

## 2020-11-12 ENCOUNTER — Encounter: Payer: Self-pay | Admitting: Cardiology

## 2020-11-12 MED ORDER — LEVOTHYROXINE SODIUM 50 MCG PO TABS: 50.0000 ug | ORAL_TABLET | Freq: Every day | ORAL | 1 refills | Status: DC

## 2020-11-12 NOTE — Telephone Encounter (Signed)
error 

## 2020-11-13 ENCOUNTER — Other Ambulatory Visit: Payer: Self-pay

## 2020-11-13 ENCOUNTER — Ambulatory Visit (INDEPENDENT_AMBULATORY_CARE_PROVIDER_SITE_OTHER): Payer: 59 | Admitting: Pharmacist

## 2020-11-13 DIAGNOSIS — I639 Cerebral infarction, unspecified: Secondary | ICD-10-CM

## 2020-11-13 DIAGNOSIS — Z9229 Personal history of other drug therapy: Secondary | ICD-10-CM | POA: Diagnosis not present

## 2020-11-13 DIAGNOSIS — I6601 Occlusion and stenosis of right middle cerebral artery: Secondary | ICD-10-CM | POA: Diagnosis not present

## 2020-11-13 LAB — POCT INR: INR: 2 (ref 2.0–3.0)

## 2020-11-13 NOTE — Patient Instructions (Addendum)
Description   Continue taking 1/2 tablet daily except 1 tablet on Sundays, Tuesdays and Thursdays Be consistent with greens Recheck in 6 weeks

## 2020-11-20 ENCOUNTER — Telehealth: Payer: Self-pay

## 2020-11-20 NOTE — Telephone Encounter (Signed)
Attempted to call daughter. No answer, left generic VM to return call to clinic.   Lorita Officer, CCMA 11/20/20 2:01 PM

## 2020-11-20 NOTE — Telephone Encounter (Signed)
Recommend pt's daughter call cardiology about bp as my understanding from the patient is that cardiology is aware and manages her bp. If unsuccessful, please call us back. kc

## 2020-11-20 NOTE — Telephone Encounter (Signed)
Pt daughter transferred to triage. Daughter states she was making an appointment and mentioned BP as been low. BP has been 80s/58. States pt hates doctors appointments and hospitals so she will lie about symptoms. Daughter states she has not complained of dizziness or chest pain.   Daughter checks BP daily and keeps log.  Pt saw cardiologist 10/11/20, BP was low at that visit.   Due to asymptomatic and bp same at cardiologist appointment made appointment for next available with Dr. Sedalia Muta. 3/31. Daughter will accompany and bring BP log.   Lorita Officer, [place] 11/20/20 12:05 PM

## 2020-11-21 NOTE — Telephone Encounter (Signed)
Attempted to call daughter, no answer. Left VM for return call to clinic.   Terrill Mohr 11/21/20 3:57 PM

## 2020-11-21 NOTE — Progress Notes (Signed)
Cancelled.  

## 2020-11-22 ENCOUNTER — Ambulatory Visit (INDEPENDENT_AMBULATORY_CARE_PROVIDER_SITE_OTHER): Payer: 59 | Admitting: Family Medicine

## 2020-11-22 DIAGNOSIS — I428 Other cardiomyopathies: Secondary | ICD-10-CM

## 2020-11-22 NOTE — Telephone Encounter (Signed)
Daughter called. Daughter states cardiologist does not manage BP including BP medications. Daughter wants Dr. Sedalia Muta to manage BP.   Daughter rescheduled appointment from today to 4/6.   FYI.  Lorita Officer, CCMA 11/22/20 11:41 AM

## 2020-11-26 ENCOUNTER — Encounter: Payer: 59 | Attending: Physician Assistant | Admitting: Physician Assistant

## 2020-11-26 ENCOUNTER — Other Ambulatory Visit: Payer: Self-pay

## 2020-11-26 DIAGNOSIS — L97818 Non-pressure chronic ulcer of other part of right lower leg with other specified severity: Secondary | ICD-10-CM | POA: Insufficient documentation

## 2020-11-26 DIAGNOSIS — E11622 Type 2 diabetes mellitus with other skin ulcer: Secondary | ICD-10-CM | POA: Diagnosis not present

## 2020-11-26 DIAGNOSIS — I87323 Chronic venous hypertension (idiopathic) with inflammation of bilateral lower extremity: Secondary | ICD-10-CM | POA: Insufficient documentation

## 2020-11-26 DIAGNOSIS — L97909 Non-pressure chronic ulcer of unspecified part of unspecified lower leg with unspecified severity: Secondary | ICD-10-CM | POA: Diagnosis present

## 2020-11-26 DIAGNOSIS — I429 Cardiomyopathy, unspecified: Secondary | ICD-10-CM | POA: Diagnosis not present

## 2020-11-26 DIAGNOSIS — L98492 Non-pressure chronic ulcer of skin of other sites with fat layer exposed: Secondary | ICD-10-CM | POA: Insufficient documentation

## 2020-11-26 DIAGNOSIS — I5022 Chronic systolic (congestive) heart failure: Secondary | ICD-10-CM | POA: Diagnosis not present

## 2020-11-26 DIAGNOSIS — L97828 Non-pressure chronic ulcer of other part of left lower leg with other specified severity: Secondary | ICD-10-CM | POA: Diagnosis not present

## 2020-11-26 DIAGNOSIS — L02211 Cutaneous abscess of abdominal wall: Secondary | ICD-10-CM | POA: Diagnosis not present

## 2020-11-26 NOTE — Progress Notes (Addendum)
BRYNNLEE, CUMPIAN ([id number]) Visit Report for 11/26/2020 Chief Complaint Document Details Patient Name: Breanna Bennett, Breanna Bennett Date of Service: 11/26/2020 2:15 PM Medical Record Number: [id number] Patient Account Number: [id number] Date of Birth/Sex: 17-May-1956 (65 y.o. F) Treating RN: Yevonne Pax Primary Care Provider: Blane Ohara Other Clinician: Lolita Cram Referring Provider: Blane Ohara Treating Provider/Extender: Rowan Blase in Treatment: 6 Information Obtained from: Patient Chief Complaint 10/10/2020; patient is here with wounds on her bilateral lower legs Electronic Signature(s) Signed: 11/26/2020 2:21:45 PM By: Lenda Kelp PA-C Entered By: Lenda Kelp on 11/26/2020 14:21:45 Breanna Bennett ([id number]) -------------------------------------------------------------------------------- HPI Details Patient Name: Breanna Bennett Date of Service: 11/26/2020 2:15 PM Medical Record Number: [id number] Patient Account Number: [id number] Date of Birth/Sex: 1956/03/07 (64 y.o. F) Treating RN: Yevonne Pax Primary Care Provider: Blane Ohara Other Clinician: Lolita Cram Referring Provider: Blane Ohara Treating Provider/Extender: Rowan Blase in Treatment: 6 History of Present Illness HPI Description: ADMISSION 10/10/2020 This is a pleasant woman from [facility]. She is here accompanied by her daughter for review of wounds on her bilateral lower legs. Seems like these are chronic and have been present for the most part for about a year. She had compression stockings at one point but I am not sure that she is actually been wearing them. She has about 4 open areas on the left and more substantially for open areas on the right. She is a diabetic but is not on any current treatment. Her most other substantive problem is a severe cardiomyopathy with an ejection fraction less than 20%. Her daughter tells me that she runs a generally low blood pressure and she  only takes 10 mg of Lasix with an adjustment if her blood pressure is over a certain value giving it additional 10 mg. It does not seem like she has gained weight recently she says he is at 160 pounds and for her this is actually a reduction. They have been using Medihoney and Neosporin to this area. No recent compression wrap use. There are 4 open areas on the left and 4 open areas on the right. ABIs in our clinic were 1.35 on the right and 1.22 on the left 11/26/2020 on evaluation today patient appears to be doing poorly in regard to her lower extremities. With that being said the biggest issue currently seems to be really that she is having issues with extreme edema of the lower extremities and Dr. Leanord Hawking was concerned about the fact that to be honest the patient really needs to be seen by cardiology to see if there is anything they can do to help diurese her more as he was concerned about doing too strong of a compression. For that reason we have been doing Kerlix and Coban only for now patient has not seen her cardiologist we have not seen her since February 16 of this year. Unfortunately her wounds appear to be a bit worse than they were previous. Electronic Signature(s) Signed: 11/26/2020 2:58:14 PM By: Lenda Kelp PA-C Entered By: Lenda Kelp on 11/26/2020 14:58:14 NAYLEA, WIGINGTON ([id number]) -------------------------------------------------------------------------------- Physical Exam Details Patient Name: Breanna Bennett Date of Service: 11/26/2020 2:15 PM Medical Record Number: [id number] Patient Account Number: [id number] Date of Birth/Sex: 1956-04-10 (64 y.o. F) Treating RN: Yevonne Pax Primary Care Provider: Blane Ohara Other Clinician: Lolita Cram Referring Provider: Blane Ohara Treating Provider/Extender: Rowan Blase in Treatment: 6 Constitutional Well-nourished and well-hydrated in no acute distress. Respiratory normal breathing without  difficulty. Psychiatric  this patient is able to make decisions and demonstrates good insight into disease process. Alert and Oriented x 3. pleasant and cooperative. Notes Upon inspection patient's wound bed actually showed signs of good granulation epithelization at this point. There does not appear to be any signs of active infection which is great news and overall I am pleased in that regard. With that being said I do think that the patient needs to see cardiology sooner rather than later. I discussed that with her today. Also think she needs to be elevating her legs more and sounds like she is mainly sent in a rocking chair. Electronic Signature(s) Signed: 11/26/2020 2:59:04 PM By: Lenda Kelp PA-C Entered By: Lenda Kelp on 11/26/2020 14:59:03 Breanna Bennett ([id number]) -------------------------------------------------------------------------------- Physician Orders Details Patient Name: Breanna Bennett Date of Service: 11/26/2020 2:15 PM Medical Record Number: [id number] Patient Account Number: [id number] Date of Birth/Sex: [birth year] (64 y.o. F) Treating RN: Yevonne Pax Primary Care Provider: Blane Ohara Other Clinician: Lolita Cram Referring Provider: Blane Ohara Treating Provider/Extender: Rowan Blase in Treatment: 6 Verbal / Phone Orders: No Diagnosis Coding ICD-10 Coding Code Description E11.622 Type 2 diabetes mellitus with other skin ulcer I87.323 Chronic venous hypertension (idiopathic) with inflammation of bilateral lower extremity L97.828 Non-pressure chronic ulcer of other part of left lower leg with other specified severity L97.818 Non-pressure chronic ulcer of other part of right lower leg with other specified severity I50.22 Chronic systolic (congestive) heart failure Follow-up Appointments o Return Appointment in 1 week. Bathing/ Shower/ Hygiene o May shower; gently cleanse wound with antibacterial soap, rinse and pat dry prior to  dressing wounds Edema Control - Lymphedema / Segmental Compressive Device / Other Bilateral Lower Extremities o Elevate, Exercise Daily and Avoid Standing for Long Periods of Time. o Elevate legs to the level of the heart and pump ankles as often as possible o Elevate leg(s) parallel to the floor when sitting. Wound Treatment Wound #2 - Lower Leg Wound Laterality: Right Primary Dressing: Silvercel Small 2x2 (in/in) (Generic) 1 x Per Day/30 Days Discharge Instructions: Apply Silvercel Small 2x2 (in/in) as instructed Secondary Dressing: Kerlix 4.5 x 4.1 (in/yd) (Generic) 1 x Per Day/30 Days Discharge Instructions: Apply Kerlix 4.5 x 4.1 (in/yd) as instructed Secondary Dressing: Xtrasorb Medium 4x5 (in/in) (Generic) 1 x Per Day/30 Days Discharge Instructions: Apply to wound as directed. Do not cut. Secured With: 28M Medipore H Soft Cloth Surgical Tape, 2x2 (in/yd) (Generic) 1 x Per Day/30 Days Secured With: Coban Cohesive Bandage 4x5 (yds) Stretched (Generic) 1 x Per Day/30 Days Discharge Instructions: Apply Coban as directed. Wound #3 - Lower Leg Wound Laterality: Right, Distal Primary Dressing: Silvercel Small 2x2 (in/in) (Generic) 1 x Per Day/30 Days Discharge Instructions: Apply Silvercel Small 2x2 (in/in) as instructed Secondary Dressing: Kerlix 4.5 x 4.1 (in/yd) (Generic) 1 x Per Day/30 Days Discharge Instructions: Apply Kerlix 4.5 x 4.1 (in/yd) as instructed Secondary Dressing: Xtrasorb Medium 4x5 (in/in) (Generic) 1 x Per Day/30 Days Discharge Instructions: Apply to wound as directed. Do not cut. Secured With: 28M Medipore H Soft Cloth Surgical Tape, 2x2 (in/yd) (Generic) 1 x Per Day/30 Days Secured With: Coban Cohesive Bandage 4x5 (yds) Stretched (Generic) 1 x Per Day/30 Days Discharge Instructions: Apply Coban as directed. Wound #4 - Lower Leg Wound Laterality: Left Mccorkle, Ellina ([id number]) Primary Dressing: Silvercel Small 2x2 (in/in) (Generic) 1 x Per Day/30  Days Discharge Instructions: Apply Silvercel Small 2x2 (in/in) as instructed Secondary Dressing: Kerlix 4.5 x 4.1 (in/yd) (Generic) 1 x Per Day/30  Days Discharge Instructions: Apply Kerlix 4.5 x 4.1 (in/yd) as instructed Secondary Dressing: Xtrasorb Medium 4x5 (in/in) (Generic) 1 x Per Day/30 Days Discharge Instructions: Apply to wound as directed. Do not cut. Secured With: 1M Medipore H Soft Cloth Surgical Tape, 2x2 (in/yd) (Generic) 1 x Per Day/30 Days Secured With: Coban Cohesive Bandage 4x5 (yds) Stretched (Generic) 1 x Per Day/30 Days Discharge Instructions: Apply Coban as directed. Wound #5 - Lower Leg Wound Laterality: Right, Medial Primary Dressing: Silvercel Small 2x2 (in/in) (Generic) 1 x Per Day/30 Days Discharge Instructions: Apply Silvercel Small 2x2 (in/in) as instructed Secondary Dressing: Kerlix 4.5 x 4.1 (in/yd) (Generic) 1 x Per Day/30 Days Discharge Instructions: Apply Kerlix 4.5 x 4.1 (in/yd) as instructed Secondary Dressing: Xtrasorb Medium 4x5 (in/in) 1 x Per Day/30 Days Discharge Instructions: Apply to wound as directed. Do not cut. Secured With: 1M Medipore H Soft Cloth Surgical Tape, 2x2 (in/yd) (Generic) 1 x Per Day/30 Days Secured With: Coban Cohesive Bandage 4x5 (yds) Stretched (Generic) 1 x Per Day/30 Days Discharge Instructions: Apply Coban as directed. Wound #6 - Lower Leg Wound Laterality: Left, Medial Primary Dressing: Silvercel Small 2x2 (in/in) (Generic) 1 x Per Day/30 Days Discharge Instructions: Apply Silvercel Small 2x2 (in/in) as instructed Secondary Dressing: Kerlix 4.5 x 4.1 (in/yd) (Generic) 1 x Per Day/30 Days Discharge Instructions: Apply Kerlix 4.5 x 4.1 (in/yd) as instructed Secondary Dressing: Xtrasorb Medium 4x5 (in/in) (Generic) 1 x Per Day/30 Days Discharge Instructions: Apply to wound as directed. Do not cut. Secured With: 1M Medipore H Soft Cloth Surgical Tape, 2x2 (in/yd) (Generic) 1 x Per Day/30 Days Secured With: Coban Cohesive Bandage  4x5 (yds) Stretched (Generic) 1 x Per Day/30 Days Discharge Instructions: Apply Coban as directed. Wound #7 - Lower Leg Wound Laterality: Left, Posterior Primary Dressing: Silvercel Small 2x2 (in/in) (Generic) 1 x Per Day/30 Days Discharge Instructions: Apply Silvercel Small 2x2 (in/in) as instructed Secondary Dressing: Kerlix 4.5 x 4.1 (in/yd) (Generic) 1 x Per Day/30 Days Discharge Instructions: Apply Kerlix 4.5 x 4.1 (in/yd) as instructed Secondary Dressing: Xtrasorb Medium 4x5 (in/in) (Generic) 1 x Per Day/30 Days Discharge Instructions: Apply to wound as directed. Do not cut. Secured With: 1M Medipore H Soft Cloth Surgical Tape, 2x2 (in/yd) (Generic) 1 x Per Day/30 Days Secured With: Coban Cohesive Bandage 4x5 (yds) Stretched (Generic) 1 x Per Day/30 Days Discharge Instructions: Apply Coban as directed. Electronic Signature(s) Signed: 11/26/2020 5:03:26 PM By: Lenda Kelp PA-C Signed: 11/30/2020 8:10:37 AM By: Yevonne Pax RN Entered By: Yevonne Pax on 11/26/2020 14:55:14 COSANDRA, PLOUFFE ([id number]) ANIYHA, TATE ([id number]) -------------------------------------------------------------------------------- Problem List Details Patient Name: Breanna Bennett Date of Service: 11/26/2020 2:15 PM Medical Record Number: [id number] Patient Account Number: [id number] Date of Birth/Sex: 1955-12-29 (64 y.o. F) Treating RN: Yevonne Pax Primary Care Provider: Blane Ohara Other Clinician: Lolita Cram Referring Provider: Blane Ohara Treating Provider/Extender: Rowan Blase in Treatment: 6 Active Problems ICD-10 Encounter Code Description Active Date MDM Diagnosis E11.622 Type 2 diabetes mellitus with other skin ulcer 10/10/2020 No Yes I87.323 Chronic venous hypertension (idiopathic) with inflammation of bilateral 10/10/2020 No Yes lower extremity L97.828 Non-pressure chronic ulcer of other part of left lower leg with other 10/10/2020 No Yes specified severity L97.818  Non-pressure chronic ulcer of other part of right lower leg with other 10/10/2020 No Yes specified severity I50.22 Chronic systolic (congestive) heart failure 10/10/2020 No Yes Inactive Problems Resolved Problems Electronic Signature(s) Signed: 11/26/2020 2:21:37 PM By: Lenda Kelp PA-C Entered By: Lenda Kelp on 11/26/2020 14:21:37 Pring,  Adaleah ([id number]) -------------------------------------------------------------------------------- Progress Note Details Patient Name: Breanna CruiseCASSIDY, Wilfred Date of Service: 11/26/2020 2:15 PM Medical Record Number: [id number] Patient Account Number: [id number] Date of Birth/Sex: 05/23/1956 (65 y.o. F) Treating RN: Yevonne PaxEpps, Carrie Primary Care Provider: Blane Oharaox , Kirsten Other Clinician: Lolita CramBurnette, Kyara Referring Provider: Blane Oharaox , Kirsten Treating Provider/Extender: Rowan BlaseStone, Daisha Filosa Weeks in Treatment: 6 Subjective Chief Complaint Information obtained from Patient 10/10/2020; patient is here with wounds on her bilateral lower legs History of Present Illness (HPI) ADMISSION 10/10/2020 This is a pleasant woman from [facility] . She is here accompanied by her daughter for review of wounds on her bilateral lower legs. Seems like these are chronic and have been present for the most part for about a year. She had compression stockings at one point but I am not sure that she is actually been wearing them. She has about 4 open areas on the left and more substantially for open areas on the right. She is a diabetic but is not on any current treatment. Her most other substantive problem is a severe cardiomyopathy with an ejection fraction less than 20%. Her daughter tells me that she runs a generally low blood pressure and she only takes 10 mg of Lasix with an adjustment if her blood pressure is over a certain value giving it additional 10 mg. It does not seem like she has gained weight recently she says he is at 160 pounds and for her this is actually a  reduction. They have been using Medihoney and Neosporin to this area. No recent compression wrap use. There are 4 open areas on the left and 4 open areas on the right. ABIs in our clinic were 1.35 on the right and 1.22 on the left 11/26/2020 on evaluation today patient appears to be doing poorly in regard to her lower extremities. With that being said the biggest issue currently seems to be really that she is having issues with extreme edema of the lower extremities and Dr. Leanord Hawkingobson was concerned about the fact that to be honest the patient really needs to be seen by cardiology to see if there is anything they can do to help diurese her more as he was concerned about doing too strong of a compression. For that reason we have been doing Kerlix and Coban only for now patient has not seen her cardiologist we have not seen her since February 16 of this year. Unfortunately her wounds appear to be a bit worse than they were previous. Objective Constitutional Well-nourished and well-hydrated in no acute distress. Vitals Time Taken: 2:03 PM, Height: 68 in, Weight: 160 lbs, BMI: 24.3, Temperature: 97.6 F, Pulse: 81 bpm, Respiratory Rate: 18 breaths/min, Blood Pressure: 109/77 mmHg. Respiratory normal breathing without difficulty. Psychiatric this patient is able to make decisions and demonstrates good insight into disease process. Alert and Oriented x 3. pleasant and cooperative. General Notes: Upon inspection patient's wound bed actually showed signs of good granulation epithelization at this point. There does not appear to be any signs of active infection which is great news and overall I am pleased in that regard. With that being said I do think that the patient needs to see cardiology sooner rather than later. I discussed that with her today. Also think she needs to be elevating her legs more and sounds like she is mainly sent in a rocking chair. Integumentary (Hair, Skin) Wound #1 status is Open.  Original cause of wound was Gradually Appeared. The date acquired was: 08/26/2019. The wound has been in  treatment 6 weeks. The wound is located on the Right,Proximal Lower Leg. The wound measures 0cm length x 0cm width x 0cm depth; 0cm^2 area and 0cm^3 volume. There is no tunneling or undermining noted. There is a none present amount of drainage noted. There is no granulation within the wound bed. There is no necrotic tissue within the wound bed. Wound #2 status is Open. Original cause of wound was Gradually Appeared. The date acquired was: 08/26/2019. The wound has been in treatment 6 weeks. The wound is located on the Right Lower Leg. The wound measures 6.52cm length x 5cm width x 0.2cm depth; 25.604cm^2 area and 5.121cm^3 volume. There is Fat Layer (Subcutaneous Tissue) exposed. There is no tunneling or undermining noted. There is a large amount of Grenfell, Kaytlynne ([id number]) serous drainage noted. There is small (1-33%) red, pink granulation within the wound bed. There is a large (67-100%) amount of necrotic tissue within the wound bed including Adherent Slough. Wound #3 status is Open. Original cause of wound was Gradually Appeared. The date acquired was: 08/26/2019. The wound has been in treatment 6 weeks. The wound is located on the Right,Distal Lower Leg. The wound measures 2.3cm length x 1.9cm width x 0.3cm depth; 3.432cm^2 area and 1.03cm^3 volume. There is Fat Layer (Subcutaneous Tissue) exposed. There is no tunneling or undermining noted. There is a large amount of serous drainage noted. There is small (1-33%) red, pink granulation within the wound bed. There is a large (67-100%) amount of necrotic tissue within the wound bed including Eschar and Adherent Slough. Wound #4 status is Open. Original cause of wound was Gradually Appeared. The date acquired was: 08/26/2019. The wound has been in treatment 6 weeks. The wound is located on the Left Lower Leg. The wound measures 5cm length x 7.5cm  width x 0.3cm depth; 29.452cm^2 area and 8.836cm^3 volume. There is Fat Layer (Subcutaneous Tissue) exposed. There is no tunneling or undermining noted. There is a large amount of serous drainage noted. There is small (1-33%) pink, pale granulation within the wound bed. There is a large (67-100%) amount of necrotic tissue within the wound bed including Adherent Slough. Wound #5 status is Open. Original cause of wound was Gradually Appeared. The date acquired was: 02/07/2020. The wound has been in treatment 6 weeks. The wound is located on the Right,Medial Lower Leg. The wound measures 7.5cm length x 4cm width x 0.1cm depth; 23.562cm^2 area and 2.356cm^3 volume. There is Fat Layer (Subcutaneous Tissue) exposed. There is no tunneling or undermining noted. There is a large amount of serous drainage noted. The wound margin is flat and intact. There is medium (34-66%) red granulation within the wound bed. There is a medium (34-66%) amount of necrotic tissue within the wound bed including Adherent Slough. Wound #6 status is Open. Original cause of wound was Gradually Appeared. The date acquired was: 02/07/2020. The wound has been in treatment 6 weeks. The wound is located on the Left,Medial Lower Leg. The wound measures 1cm length x 0.7cm width x 0.1cm depth; 0.55cm^2 area and 0.055cm^3 volume. There is Fat Layer (Subcutaneous Tissue) exposed. There is no tunneling or undermining noted. There is a medium amount of serous drainage noted. There is medium (34-66%) red, pink granulation within the wound bed. There is a medium (34-66%) amount of necrotic tissue within the wound bed including Adherent Slough. Wound #7 status is Open. Original cause of wound was Gradually Appeared. The date acquired was: 02/07/2020. The wound has been in treatment 6 weeks. The wound  is located on the Left,Posterior Lower Leg. The wound measures 1cm length x 0.5cm width x 0.1cm depth; 0.393cm^2 area and 0.039cm^3 volume. There is Fat  Layer (Subcutaneous Tissue) exposed. There is no tunneling or undermining noted. There is a small amount of serous drainage noted. There is medium (34-66%) red, pink granulation within the wound bed. There is a medium (34-66%) amount of necrotic tissue within the wound bed including Adherent Slough. Assessment Active Problems ICD-10 Type 2 diabetes mellitus with other skin ulcer Chronic venous hypertension (idiopathic) with inflammation of bilateral lower extremity Non-pressure chronic ulcer of other part of left lower leg with other specified severity Non-pressure chronic ulcer of other part of right lower leg with other specified severity Chronic systolic (congestive) heart failure Plan Follow-up Appointments: Return Appointment in 1 week. Bathing/ Shower/ Hygiene: May shower; gently cleanse wound with antibacterial soap, rinse and pat dry prior to dressing wounds Edema Control - Lymphedema / Segmental Compressive Device / Other: Elevate, Exercise Daily and Avoid Standing for Long Periods of Time. Elevate legs to the level of the heart and pump ankles as often as possible Elevate leg(s) parallel to the floor when sitting. WOUND #2: - Lower Leg Wound Laterality: Right Primary Dressing: Silvercel Small 2x2 (in/in) (Generic) 1 x Per Day/30 Days Discharge Instructions: Apply Silvercel Small 2x2 (in/in) as instructed Secondary Dressing: Kerlix 4.5 x 4.1 (in/yd) (Generic) 1 x Per Day/30 Days Discharge Instructions: Apply Kerlix 4.5 x 4.1 (in/yd) as instructed Secondary Dressing: Xtrasorb Medium 4x5 (in/in) (Generic) 1 x Per Day/30 Days Discharge Instructions: Apply to wound as directed. Do not cut. Secured With: 90M Medipore H Soft Cloth Surgical Tape, 2x2 (in/yd) (Generic) 1 x Per Day/30 Days Secured With: Coban Cohesive Bandage 4x5 (yds) Stretched (Generic) 1 x Per Day/30 Days Discharge Instructions: Apply Coban as directed. WOUND #3: - Lower Leg Wound Laterality: Right, Distal Primary  Dressing: Silvercel Small 2x2 (in/in) (Generic) 1 x Per Day/30 Days Discharge Instructions: Apply Silvercel Small 2x2 (in/in) as instructed Secondary Dressing: Kerlix 4.5 x 4.1 (in/yd) (Generic) 1 x Per Day/30 Days Discharge Instructions: Apply Kerlix 4.5 x 4.1 (in/yd) as instructed Alonge, Dawson ([id number]) Secondary Dressing: Anola Gurney Medium 4x5 (in/in) (Generic) 1 x Per Day/30 Days Discharge Instructions: Apply to wound as directed. Do not cut. Secured With: 90M Medipore H Soft Cloth Surgical Tape, 2x2 (in/yd) (Generic) 1 x Per Day/30 Days Secured With: Coban Cohesive Bandage 4x5 (yds) Stretched (Generic) 1 x Per Day/30 Days Discharge Instructions: Apply Coban as directed. WOUND #4: - Lower Leg Wound Laterality: Left Primary Dressing: Silvercel Small 2x2 (in/in) (Generic) 1 x Per Day/30 Days Discharge Instructions: Apply Silvercel Small 2x2 (in/in) as instructed Secondary Dressing: Kerlix 4.5 x 4.1 (in/yd) (Generic) 1 x Per Day/30 Days Discharge Instructions: Apply Kerlix 4.5 x 4.1 (in/yd) as instructed Secondary Dressing: Xtrasorb Medium 4x5 (in/in) (Generic) 1 x Per Day/30 Days Discharge Instructions: Apply to wound as directed. Do not cut. Secured With: 90M Medipore H Soft Cloth Surgical Tape, 2x2 (in/yd) (Generic) 1 x Per Day/30 Days Secured With: Coban Cohesive Bandage 4x5 (yds) Stretched (Generic) 1 x Per Day/30 Days Discharge Instructions: Apply Coban as directed. WOUND #5: - Lower Leg Wound Laterality: Right, Medial Primary Dressing: Silvercel Small 2x2 (in/in) (Generic) 1 x Per Day/30 Days Discharge Instructions: Apply Silvercel Small 2x2 (in/in) as instructed Secondary Dressing: Kerlix 4.5 x 4.1 (in/yd) (Generic) 1 x Per Day/30 Days Discharge Instructions: Apply Kerlix 4.5 x 4.1 (in/yd) as instructed Secondary Dressing: Xtrasorb Medium 4x5 (in/in) 1 x Per  Day/30 Days Discharge Instructions: Apply to wound as directed. Do not cut. Secured With: 57M Medipore H Soft Cloth  Surgical Tape, 2x2 (in/yd) (Generic) 1 x Per Day/30 Days Secured With: Coban Cohesive Bandage 4x5 (yds) Stretched (Generic) 1 x Per Day/30 Days Discharge Instructions: Apply Coban as directed. WOUND #6: - Lower Leg Wound Laterality: Left, Medial Primary Dressing: Silvercel Small 2x2 (in/in) (Generic) 1 x Per Day/30 Days Discharge Instructions: Apply Silvercel Small 2x2 (in/in) as instructed Secondary Dressing: Kerlix 4.5 x 4.1 (in/yd) (Generic) 1 x Per Day/30 Days Discharge Instructions: Apply Kerlix 4.5 x 4.1 (in/yd) as instructed Secondary Dressing: Xtrasorb Medium 4x5 (in/in) (Generic) 1 x Per Day/30 Days Discharge Instructions: Apply to wound as directed. Do not cut. Secured With: 57M Medipore H Soft Cloth Surgical Tape, 2x2 (in/yd) (Generic) 1 x Per Day/30 Days Secured With: Coban Cohesive Bandage 4x5 (yds) Stretched (Generic) 1 x Per Day/30 Days Discharge Instructions: Apply Coban as directed. WOUND #7: - Lower Leg Wound Laterality: Left, Posterior Primary Dressing: Silvercel Small 2x2 (in/in) (Generic) 1 x Per Day/30 Days Discharge Instructions: Apply Silvercel Small 2x2 (in/in) as instructed Secondary Dressing: Kerlix 4.5 x 4.1 (in/yd) (Generic) 1 x Per Day/30 Days Discharge Instructions: Apply Kerlix 4.5 x 4.1 (in/yd) as instructed Secondary Dressing: Xtrasorb Medium 4x5 (in/in) (Generic) 1 x Per Day/30 Days Discharge Instructions: Apply to wound as directed. Do not cut. Secured With: 57M Medipore H Soft Cloth Surgical Tape, 2x2 (in/yd) (Generic) 1 x Per Day/30 Days Secured With: Coban Cohesive Bandage 4x5 (yds) Stretched (Generic) 1 x Per Day/30 Days Discharge Instructions: Apply Coban as directed. 1. Would recommend currently that we going continue with the wound care measures as before and the patient is in agreement with plan that includes the use of the silver alginate dressing followed by XtraSorb to the legs. 2. I am also can recommend that we continue with the Curlex and  Coban wrap which I think is a good way to go to help with some of the edema control here. 3. I am also going to suggest that we have her going and continue to elevate her legs much as possible to try to keep edema under good control. Obviously the more she can elevate the better off she will be. 4. She also needs to see her cardiologist ASAP to discuss any increases in diuresis that could be obtained through the use of oral diuretic medication. Obviously I think we could can pressure stronger with compression wraps here in the clinic but Dr. Jannetta Quint concern was that of making her worse from the standpoint of congestive heart failure. We will see patient back for reevaluation in 1 week here in the clinic. If anything worsens or changes patient will contact our office for additional recommendations. Electronic Signature(s) Signed: 11/26/2020 3:00:07 PM By: Lenda Kelp PA-C Entered By: Lenda Kelp on 11/26/2020 15:00:07 Breanna Bennett ([id number]) -------------------------------------------------------------------------------- SuperBill Details Patient Name: Breanna Bennett Date of Service: 11/26/2020 Medical Record Number: [id number] Patient Account Number: [id number] Date of Birth/Sex: 1956/03/13 (64 y.o. F) Treating RN: Yevonne Pax Primary Care Provider: Blane Ohara Other Clinician: Lolita Cram Referring Provider: Blane Ohara Treating Provider/Extender: Rowan Blase in Treatment: 6 Diagnosis Coding ICD-10 Codes Code Description [phone] Type 2 diabetes mellitus with other skin ulcer I87.323 Chronic venous hypertension (idiopathic) with inflammation of bilateral lower extremity L97.828 Non-pressure chronic ulcer of other part of left lower leg with other specified severity L97.818 Non-pressure chronic ulcer of other part of right lower leg with  other specified severity I50.22 Chronic systolic (congestive) heart failure Facility Procedures CPT4 Code:  16109604 Description: [phone] - WOUND CARE VISIT-LEV 5 EST PT Modifier: Quantity: 1 Physician Procedures CPT4 Code: 1191478 Description: 99213 - WC PHYS LEVEL 3 - EST PT Modifier: Quantity: 1 CPT4 Code: Description: ICD-10 Diagnosis Description E11.622 Type 2 diabetes mellitus with other skin ulcer I87.323 Chronic venous hypertension (idiopathic) with inflammation of bilateral low L97.828 Non-pressure chronic ulcer of other part of left lower leg with  other speci L97.818 Non-pressure chronic ulcer of other part of right lower leg with other spec Modifier: er extremity fied severity ified severity Quantity: Electronic Signature(s) Signed: 11/26/2020 3:00:22 PM By: Lenda Kelp PA-C Entered By: Lenda Kelp on 11/26/2020 15:00:21

## 2020-11-26 NOTE — Telephone Encounter (Signed)
This patient is fairly new to me and her daughter is concerned about her bp being so low. It has been low and I was told by the patient at her first visit that cardiology (you) adjusted her bp medicines. Daughter wants me to make adjustments. Would pt benefit from midodrine?  I can make any adjustments you suggest, but with her bp being so low I prefer your input.  Thanks, [name]

## 2020-11-27 ENCOUNTER — Telehealth: Payer: Self-pay | Admitting: Emergency Medicine

## 2020-11-27 NOTE — Telephone Encounter (Signed)
Called patient per Dr. Bing Matter request. She reports she feels fine no dizziness. No weakness. I advised her to let us know if blood pressure continues to drop or she starts to feel worse but for now just monitor per Dr. Bing Matter.

## 2020-11-28 ENCOUNTER — Ambulatory Visit: Payer: 59 | Admitting: Legal Medicine

## 2020-11-28 ENCOUNTER — Ambulatory Visit: Payer: 59 | Admitting: Family Medicine

## 2020-11-28 ENCOUNTER — Ambulatory Visit: Payer: 59 | Admitting: Nurse Practitioner

## 2020-11-29 ENCOUNTER — Other Ambulatory Visit: Payer: Self-pay

## 2020-11-30 ENCOUNTER — Encounter: Payer: Self-pay | Admitting: Cardiology

## 2020-11-30 ENCOUNTER — Other Ambulatory Visit: Payer: Self-pay

## 2020-11-30 ENCOUNTER — Ambulatory Visit (INDEPENDENT_AMBULATORY_CARE_PROVIDER_SITE_OTHER): Payer: 59 | Admitting: Cardiology

## 2020-11-30 VITALS — BP 96/62 | HR 48 | Ht 68.0 in | Wt 165.0 lb

## 2020-11-30 DIAGNOSIS — I5022 Chronic systolic (congestive) heart failure: Secondary | ICD-10-CM | POA: Diagnosis not present

## 2020-11-30 DIAGNOSIS — I428 Other cardiomyopathies: Secondary | ICD-10-CM

## 2020-11-30 DIAGNOSIS — S81809D Unspecified open wound, unspecified lower leg, subsequent encounter: Secondary | ICD-10-CM | POA: Diagnosis not present

## 2020-11-30 DIAGNOSIS — T148XXA Other injury of unspecified body region, initial encounter: Secondary | ICD-10-CM

## 2020-11-30 DIAGNOSIS — I519 Heart disease, unspecified: Secondary | ICD-10-CM | POA: Diagnosis not present

## 2020-11-30 MED ORDER — CARVEDILOL 3.125 MG PO TABS
3.1250 mg | ORAL_TABLET | Freq: Two times a day (BID) | ORAL | 1 refills | Status: DC
Start: 2020-11-30 — End: 2021-01-23

## 2020-11-30 MED ORDER — FUROSEMIDE 20 MG PO TABS: 20.0000 mg | ORAL_TABLET | Freq: Two times a day (BID) | ORAL | 1 refills | Status: DC

## 2020-11-30 NOTE — Patient Instructions (Signed)
Medication Instructions:  Your physician has recommended you make the following change in your medication:   DECREASE: Carvedilol to 3.125 mg twice daily   INCREASE: Lasix to 20 mg twice daily *If you need a refill on your cardiac medications before your next appointment, please call your pharmacy*   Lab Work: Your physician recommends that you return for lab work today: bmp   If you have labs (blood work) drawn today and your tests are completely normal, you will receive your results only by: Marland Kitchen MyChart Message (if you have MyChart) OR . A paper copy in the mail If you have any lab test that is abnormal or we need to change your treatment, we will call you to review the results.   Testing/Procedures: None   Follow-Up: At [facility], you and your health needs are our priority.  As part of our continuing mission to provide you with exceptional heart care, we have created designated Provider Care Teams.  These Care Teams include your primary Cardiologist (physician) and Advanced Practice Providers (APPs -  Physician Assistants and Nurse Practitioners) who all work together to provide you with the care you need, when you need it.  We recommend signing up for the patient portal called "MyChart".  Sign up information is provided on this After Visit Summary.  MyChart is used to connect with patients for Virtual Visits (Telemedicine).  Patients are able to view lab/test results, encounter notes, upcoming appointments, etc.  Non-urgent messages can be sent to your provider as well.   To learn more about what you can do with MyChart, go to [email or web address].    Your next appointment:   3 week(s)  The format for your next appointment:   In Person  Provider:   Gypsy Balsam, MD   Other Instructions

## 2020-11-30 NOTE — Progress Notes (Signed)
Cardiology Office Note:    Date:  11/30/2020   ID:  Breanna Bennett, DOB [birth year], MRN [id number]  PCP:  Blane Ohara, MD  Cardiologist:  Gypsy Balsam, MD    Referring MD: Blane Ohara, MD   Chief Complaint  Patient presents with  . Leg Swelling    History of Present Illness:    Breanna Bennett is a 65 y.o. female with past medical history significant for cardiomyopathy which is nonischemic ejection fraction 20 to 25%, history of CVA, left bundle branch block, dyslipidemia, essential hypertension, she does have open wounds on her legs.  She was sent back to me with consideration for fluid management.  It is a very difficult situation.  Luckily her kidney function is normal but her blood pressure is very low.  She is only on 20 mg of furosemide with significant swelling of lower extremities.  On top of that she does have open wound in both legs and those do not heal unless we get the swelling under control.  The biggest limiting factor here is her blood pressure.  I think we clearly reaching the point of palliative care should be considered.  Past Medical History:  Diagnosis Date  . Acute CVA (cerebrovascular accident) (HCC) 05/30/2019  . Acute respiratory failure (HCC)   . Acute systolic CHF (congestive heart failure) (HCC) 01/12/2019  . Cardiac cachexia 07/04/2020  . Chronic systolic CHF (congestive heart failure) (HCC)   . Class 2 severe obesity due to excess calories with serious comorbidity and body mass index (BMI) of 38.0 to 38.9 in adult (HCC) 10/19/2018  . Cough due to bronchospasm 02/16/2019  . Elevated hemoglobin (HCC)   . Elevated uric acid in blood 06/06/2019  . Essential hypertension 01/12/2019  . Gastroesophageal reflux disease 10/19/2018  . GERD (gastroesophageal reflux disease)   . History of Coumadin therapy 06/16/2019  . History of CVA (cerebrovascular accident) 05/30/2019  . History of gout 12/06/2019  . History of hemiplegia 05/30/2019  . Hospital discharge  follow-up 06/24/2019  . Hyperlipidemia   . Hypertension   . Hypokalemia 12/06/2019  . Impaired mobility and ADLs 05/30/2019  . Late effect of cerebrovascular accident (CVA) 12/12/2019  . LBBB (left bundle branch block)   . Left ventricular ejection fraction less than 20% 05/30/2019  . Middle cerebral artery embolism, 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  . Mild renal insufficiency   . Mitral regurgitation   . NICM (nonischemic cardiomyopathy) (HCC)   . Nonischemic cardiomyopathy [facility]) cardiac catheterization May 2020 showed normal coronaries, ejection fraction 30% 02/09/2019  . Open wound 1 year ago   Bilateral legs  . Perennial allergic rhinitis 02/16/2019  . Peripheral edema 12/06/2019  . Pre-diabetes   . Stroke (cerebrum) (HCC) - R MCA infarct s/p mechanical thrombectomy 05/19/2019  . Systolic CHF, acute (HCC) 02/09/2019  . Tricuspid regurgitation   . Type 2 diabetes mellitus with hyperglycemia, without long-term current use of insulin (HCC) 05/30/2019  . Type 2 diabetes mellitus, uncontrolled (HCC) 05/27/2019    Past Surgical History:  Procedure Laterality Date  . IR ANGIO VERTEBRAL SEL SUBCLAVIAN INNOMINATE UNI R MOD SED  05/19/2019  . IR CT HEAD LTD  05/19/2019  . IR PERCUTANEOUS ART THROMBECTOMY/INFUSION INTRACRANIAL INC DIAG ANGIO  05/19/2019  . RADIOLOGY WITH ANESTHESIA N/A 05/19/2019   Procedure: IR WITH ANESTHESIA;  Surgeon: Julieanne Cotton, MD;  Location: [facility] OR;  Service: Radiology;  Laterality: N/A;  . RIGHT/LEFT HEART CATH AND CORONARY ANGIOGRAPHY N/A 01/13/2019   Procedure: RIGHT/LEFT HEART CATH AND CORONARY ANGIOGRAPHY;  Surgeon: Runell Gess, MD;  Location: [facility] INVASIVE CV LAB;  Service: Cardiovascular;  Laterality: N/A;  . TUBAL LIGATION      Current Medications: Current Meds  Medication Sig  . allopurinol (ZYLOPRIM) 100 MG tablet Take 100 mg by mouth 2 (two) times daily.  Marland Kitchen atorvastatin (LIPITOR) 10 MG tablet Take 10 mg by mouth daily.  . carvedilol (COREG) 6.25 MG tablet Take 1 tablet  (6.25 mg total) by mouth 2 (two) times daily with a meal.  . famotidine (PEPCID) 40 MG tablet Take 40 mg by mouth at bedtime.   . fluticasone (FLONASE) 50 MCG/ACT nasal spray Place 2 sprays into both nostrils daily as needed for allergies or rhinitis.  . furosemide (LASIX) 40 MG tablet Take 40 mg by mouth daily.  Marland Kitchen levothyroxine (SYNTHROID) 50 MCG tablet Take 1 tablet (50 mcg total) by mouth daily before breakfast.  . loratadine (CLARITIN) 10 MG tablet Take 10 mg by mouth daily as needed for allergies or rhinitis.   Marland Kitchen montelukast (SINGULAIR) 10 MG tablet Take 10 mg by mouth at bedtime.  . potassium chloride (KLOR-CON) 10 MEQ tablet TAKE 1 TABLET(10 MEQ) BY MOUTH DAILY (Patient taking differently: Take 10 mEq by mouth once.)  . spironolactone (ALDACTONE) 25 MG tablet TAKE 1/2 TABLET(12.5 MG) BY MOUTH DAILY (Patient taking differently: Take 12.5 mg by mouth once.)  . Vitamin D, Ergocalciferol, (DRISDOL) 1.25 MG (50000 UT) CAPS capsule Take 50,000 Units by mouth every Monday.   . warfarin (COUMADIN) 2.5 MG tablet TAKE 1 TABLET BY MOUTH DAILY EXCEPT 1/2 TABLET ON MONDAY, WEDNESDAY, FRIDAY OR AS DIRECTED (Patient taking differently: Take 2.5 mg by mouth 3 (three) times a week. TAKE 1 TABLET BY MOUTH DAILY EXCEPT 1/2 TABLET ON MONDAY, WEDNESDAY, FRIDAY OR AS DIRECTED)     Allergies:   Lisinopril and Losartan   Social History   Socioeconomic History  . Marital status: Unknown    Spouse name: Not on file  . Number of children: Not on file  . Years of education: Not on file  . Highest education level: Not on file  Occupational History  . Not on file  Tobacco Use  . Smoking status: Never Smoker  . Smokeless tobacco: Never Used  Vaping Use  . Vaping Use: Never used  Substance and Sexual Activity  . Alcohol use: Never  . Drug use: Never  . Sexual activity: Not on file  Other Topics Concern  . Not on file  Social History Narrative  . Not on file   Social Determinants of Health   Financial  Resource Strain: Not on file  Food Insecurity: Not on file  Transportation Needs: Not on file  Physical Activity: Not on file  Stress: Not on file  Social Connections: Not on file     Family History: The patient's family history includes CAD in her mother; Cancer in her brother; Colon cancer in her father; Heart failure in her mother; Hypertension in her mother. ROS:   Please see the history of present illness.    All 14 point review of systems negative except as described per history of present illness  EKGs/Labs/Other Studies Reviewed:      Recent Labs: 07/04/2020: NT-Pro BNP 7,165 08/08/2020: ALT 6; BNP 2,850.2; Hemoglobin 12.5; Platelets 316; TSH 2.960 10/11/2020: BUN 17; Creatinine, Ser 0.93; Potassium 5.0; Sodium 135  Recent Lipid Panel    Component Value Date/Time   CHOL 68 (L) 09/12/2020 1347   TRIG 52 09/12/2020 1347   HDL 17 (  L) 09/12/2020 1347   CHOLHDL 4.0 09/12/2020 1347   CHOLHDL 6.4 05/20/2019 0441   VLDL 18 05/20/2019 0441   LDLCALC 38 09/12/2020 1347    Physical Exam:    VS:  BP 96/62 (BP Location: Right Arm, Patient Position: Sitting)   Pulse (!) 48   Ht 5\' 8"  (1.727 m)   Wt 165 lb (74.8 kg)   SpO2 94%   BMI 25.09 kg/m     Wt Readings from Last 3 Encounters:  11/30/20 165 lb (74.8 kg)  10/11/20 183 lb (83 kg)  09/12/20 200 lb (90.7 kg)     GEN:  Well nourished, well developed in no acute distress HEENT: Normal NECK: No JVD; No carotid bruits LYMPHATICS: No lymphadenopathy CARDIAC: RRR, holosystolic murmur grade 2/6 best heard left border of sternum, no rubs, no gallops RESPIRATORY:  Clear to auscultation without rales, wheezing or rhonchi  ABDOMEN: Soft, non-tender, non-distended MUSCULOSKELETAL:  No edema; No deformity  SKIN: Warm and dry LOWER EXTREMITIES: 2+ swelling with some wound being dressed NEUROLOGIC:  Alert and oriented x 3 PSYCHIATRIC:  Normal affect   ASSESSMENT:    1. Nonischemic cardiomyopathy [facility]) cardiac  catheterization May 2020 showed normal coronaries, ejection fraction 30%   2. Chronic systolic CHF (congestive heart failure) (HCC)   3. Open wound   4. Cardiac cachexia    PLAN:    In order of problems listed above:  1. Nonischemic cardiomyopathy what I will do today I will increase dose of furosemide she takes only 20 mg we will increase it to 20 mg twice daily, to compensate for anticipated blood pressure blood drop I will cut down her carvedilol to only 3.125 mg twice daily again with talking about quality of life rather than quantity.  I will check Chem-7 today I will see her in my office very quickly within next few weeks.  Overall however her prognosis is very poor and I think we need to consider palliative care. 2. Open wound on her legs does will not heal unless we get swelling under control and that is why I am trying to do. 3. Cardiac cachexia: Quite obvious, prognosis very poor.  Not sure if she realized that.  I tried to explain to them that things are not looking good.  She is refusing ICD/BiV pacing.  She been refusing forever for this procedure.  Overall her [place] [employer] is class III/IV   Medication Adjustments/Labs and Tests Ordered: Current medicines are reviewed at length with the patient today.  Concerns regarding medicines are outlined above.  No orders of the defined types were placed in this encounter.  Medication changes: No orders of the defined types were placed in this encounter.   Signed, June 2020, MD, [facility] 11/30/2020 3:34 PM    [facility]

## 2020-11-30 NOTE — Progress Notes (Signed)
Breanna CruiseCASSIDY, Sharmon ([id number]) Visit Report for 11/26/2020 Arrival Information Details Patient Name: Breanna CruiseCASSIDY, Talea Date of Service: 11/26/2020 2:15 PM Medical Record Number: [id number] Patient Account Number: [id number] Date of Birth/Sex: 1956/07/16 (65 y.o. F) Treating RN: Yevonne PaxEpps, Carrie Primary Care Sorcha Rotunno: Blane Oharaox , Kirsten Other Clinician: Lolita CramBurnette, Kyara Referring Shontez Sermon: Blane Oharaox , Kirsten Treating Elana Jian/Extender: Rowan BlaseStone, Hoyt Weeks in Treatment: 6 Visit Information History Since Last Visit All ordered tests and consults were completed: No Patient Arrived: Wheel Chair Added or deleted any medications: No Arrival Time: 13:58 Had a fall or experienced change in No Accompanied By: daughter activities of daily living that may affect Transfer Assistance: None risk of falls: Patient Identification Verified: Yes Hospitalized since last visit: No Secondary Verification Process Completed: Yes Pain Present Now: No Patient Requires Transmission-Based Precautions: No Patient Has Alerts: No Electronic Signature(s) Signed: 11/26/2020 4:17:01 PM By: Lolita CramBurnette, Kyara Entered By: Lolita CramBurnette, Kyara on 11/26/2020 14:02:57 Breanna CruiseASSIDY, Judi ([id number]) -------------------------------------------------------------------------------- Clinic Level of Care Assessment Details Patient Name: Breanna CruiseASSIDY, Khaleesi Date of Service: 11/26/2020 2:15 PM Medical Record Number: [id number] Patient Account Number: [id number] Date of Birth/Sex: 1956/07/16 (64 y.o. F) Treating RN: Yevonne PaxEpps, Carrie Primary Care Caniya Tagle: Blane Oharaox , Kirsten Other Clinician: Lolita CramBurnette, Kyara Referring Tycen Dockter: Blane Oharaox , Kirsten Treating Ziona Wickens/Extender: Rowan BlaseStone, Hoyt Weeks in Treatment: 6 Clinic Level of Care Assessment Items TOOL 4 Quantity Score X - Use when only an EandM is performed on FOLLOW-UP visit 1 0 ASSESSMENTS - Nursing Assessment / Reassessment X - Reassessment of Co-morbidities (includes updates in patient status) 1 10 X- 1 5 Reassessment of  Adherence to Treatment Plan ASSESSMENTS - Wound and Skin Assessment / Reassessment []  - Simple Wound Assessment / Reassessment - one wound 0 X- 6 5 Complex Wound Assessment / Reassessment - multiple wounds []  - 0 Dermatologic / Skin Assessment (not related to wound area) ASSESSMENTS - Focused Assessment []  - Circumferential Edema Measurements - multi extremities 0 []  - 0 Nutritional Assessment / Counseling / Intervention []  - 0 Lower Extremity Assessment (monofilament, tuning fork, pulses) []  - 0 Peripheral Arterial Disease Assessment (using hand held doppler) ASSESSMENTS - Ostomy and/or Continence Assessment and Care []  - Incontinence Assessment and Management 0 []  - 0 Ostomy Care Assessment and Management (repouching, etc.) PROCESS - Coordination of Care []  - Simple Patient / Family Education for ongoing care 0 X- 1 20 Complex (extensive) Patient / Family Education for ongoing care []  - 0 Staff obtains ChiropractorConsents, Records, Test Results / Process Orders []  - 0 Staff telephones HHA, Nursing Homes / Clarify orders / etc []  - 0 Routine Transfer to another Facility (non-emergent condition) []  - 0 Routine Hospital Admission (non-emergent condition) []  - 0 New Admissions / Manufacturing engineernsurance Authorizations / Ordering NPWT, Apligraf, etc. []  - 0 Emergency Hospital Admission (emergent condition) X- 1 10 Simple Discharge Coordination []  - 0 Complex (extensive) Discharge Coordination PROCESS - Special Needs []  - Pediatric / Minor Patient Management 0 []  - 0 Isolation Patient Management []  - 0 Hearing / Language / Visual special needs []  - 0 Assessment of Community assistance (transportation, D/C planning, etc.) []  - 0 Additional assistance / Altered mentation []  - 0 Support Surface(s) Assessment (bed, cushion, seat, etc.) INTERVENTIONS - Wound Cleansing / Measurement Essner, Brittiany ([id number]) []  - 0 Simple Wound Cleansing - one wound X- 7 5 Complex Wound Cleansing - multiple  wounds X- 1 5 Wound Imaging (photographs - any number of wounds) []  - 0 Wound Tracing (instead of photographs) []  - 0 Simple Wound Measurement - one wound X- 7  5 Complex Wound Measurement - multiple wounds INTERVENTIONS - Wound Dressings X - Small Wound Dressing one or multiple wounds 4 10 X- 2 15 Medium Wound Dressing one or multiple wounds []  - 0 Large Wound Dressing one or multiple wounds X- 1 5 Application of Medications - topical []  - 0 Application of Medications - injection INTERVENTIONS - Miscellaneous []  - External ear exam 0 []  - 0 Specimen Collection (cultures, biopsies, blood, body fluids, etc.) []  - 0 Specimen(s) / Culture(s) sent or taken to Lab for analysis []  - 0 Patient Transfer (multiple staff / Nurse, adult / Similar devices) []  - 0 Simple Staple / Suture removal (25 or less) []  - 0 Complex Staple / Suture removal (26 or more) []  - 0 Hypo / Hyperglycemic Management (close monitor of Blood Glucose) []  - 0 Ankle / Brachial Index (ABI) - do not check if billed separately X- 1 5 Vital Signs Has the patient been seen at the hospital within the last three years: Yes Total Score: 230 Level Of Care: New/Established - Level 5 Electronic Signature(s) Signed: 11/30/2020 8:10:37 AM By: Yevonne Pax RN Entered By: Yevonne Pax on 11/26/2020 14:56:18 Breanna Bennett ([id number]) -------------------------------------------------------------------------------- Encounter Discharge Information Details Patient Name: Breanna Bennett Date of Service: 11/26/2020 2:15 PM Medical Record Number: [id number] Patient Account Number: [id number] Date of Birth/Sex: Jan 05, 1956 (64 y.o. F) Treating RN: Rogers Blocker Primary Care Dorsey Charette: Blane Ohara Other Clinician: Lolita Cram Referring Alegandra Sommers: Blane Ohara Treating Majel Giel/Extender: Rowan Blase in Treatment: 6 Encounter Discharge Information Items Discharge Condition: Stable Ambulatory Status:  Wheelchair Discharge Destination: Home Transportation: Private Auto Accompanied By: daughter Schedule Follow-up Appointment: Yes Clinical Summary of Care: Electronic Signature(s) Signed: 11/26/2020 4:55:51 PM By: Phillis Haggis, Dondra Prader RN Entered By: Phillis Haggis, Dondra Prader on 11/26/2020 15:09:33 Breanna Bennett ([id number]) -------------------------------------------------------------------------------- Lower Extremity Assessment Details Patient Name: Breanna Bennett Date of Service: 11/26/2020 2:15 PM Medical Record Number: [id number] Patient Account Number: [id number] Date of Birth/Sex: 13-Apr-1956 (64 y.o. F) Treating RN: Yevonne Pax Primary Care Baraa Tubbs: Blane Ohara Other Clinician: Lolita Cram Referring Jamian Andujo: Blane Ohara Treating Kynlei Piontek/Extender: Allen Derry Weeks in Treatment: 6 Edema Assessment Assessed: [Left: Yes] [Right: Yes] Edema: [Left: Yes] [Right: Yes] Calf Left: Right: Point of Measurement: 40 cm From Medial Instep 39 cm 41 cm Ankle Left: Right: Point of Measurement: 10 cm From Medial Instep 23.5 cm 23.2 cm Vascular Assessment Pulses: Dorsalis Pedis Palpable: [Left:Yes] [Right:Yes] Electronic Signature(s) Signed: 11/26/2020 4:17:01 PM By: Lolita Cram Signed: 11/30/2020 8:10:37 AM By: Yevonne Pax RN Entered By: Lolita Cram on 11/26/2020 14:26:40 Breanna Bennett ([id number]) -------------------------------------------------------------------------------- Multi Wound Chart Details Patient Name: Breanna Bennett Date of Service: 11/26/2020 2:15 PM Medical Record Number: [id number] Patient Account Number: [id number] Date of Birth/Sex: 08-Nov-1955 (64 y.o. 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 Extremity Extremity Comorbid History: Congestive Heart Failure, Congestive Heart Failure, Congestive Heart Failure, Hypertension, Type II Diabetes Hypertension, Type II Diabetes Hypertension, Type II Diabetes Date Acquired: 08/26/2019 08/26/2019 08/26/2019 Weeks of Treatment: 6 6 6  Wound Status: Open  Open Open Measurements L x W x D (cm) 0.1x0.1x0.1 6.52x5x0.2 2.3x1.9x0.3 Area (cm) : 0.008 25.604 3.432 Volume (cm) : 0.001 5.121 1.03 % Reduction in Area: 94.90% -262.60% 40.00% % Reduction in Volume: 96.80% -20.90% 55.00% Classification: Grade 2 Grade 2 Grade 2 Exudate Amount: None Present Large Large Exudate Type: N/A Serous Serous Exudate Color: N/A amber amber Wound Margin: N/A N/A N/A Granulation Amount: None Present (0%) Small (1-33%) Small (1-33%) Granulation Quality: N/A Red, Pink Red, Pink Necrotic Amount: None Present (0%) Large (67-100%) Large (67-100%) Necrotic Tissue: N/A Adherent Slough Eschar, Adherent Slough Exposed Structures: Fascia: No Fat Layer (Subcutaneous Tissue): Fat Layer (Subcutaneous Tissue): Fat Layer (Subcutaneous Tissue): Yes Yes No Fascia: No Fascia: No Tendon: No Tendon: No Tendon: No Muscle: No Muscle: No Muscle: No Joint: No Joint: No Joint: No Bone: No Bone: No Bone: No Epithelialization: None None None Louthan, Raiana ([id number]) Wound Number: 4 5 6  Photos: Wound Location: Left Lower Leg Right, Medial Lower Leg Left, Medial Lower Leg Wounding Event: Gradually Appeared Gradually Appeared Gradually Appeared Primary Etiology: Diabetic Wound/Ulcer of the Lower Diabetic  Wound/Ulcer of the Lower Diabetic Wound/Ulcer of the Lower Extremity Extremity Extremity Comorbid History: Congestive Heart Failure, Congestive Heart Failure, Congestive Heart Failure, Hypertension, Type II Diabetes Hypertension, Type II Diabetes Hypertension, Type II Diabetes Date Acquired: 08/26/2019 02/07/2020 02/07/2020 Weeks of Treatment: 6 6 6  Wound Status: Open Open Open Measurements L x W x D (cm) 5x7.5x0.3 7.5x4x0.1 1x0.7x0.1 Area (cm) : 29.452 23.562 0.55 Volume (cm) : 8.836 2.356 0.055 % Reduction in Area: -290.60% -55.80% -180.60% % Reduction in Volume: -290.60% 22.10% 6.80% Classification: Grade 2 Grade 2 Grade 2 Exudate Amount: Large Large Medium Exudate Type: Serous Serous Serous Exudate Color: amber amber amber Wound Margin: N/A Flat and Intact N/A Granulation Amount: Small (1-33%) Medium (34-66%) Medium (34-66%) Granulation Quality: Pink, Pale Red Red, Pink Necrotic Amount: Large (67-100%) Medium (34-66%) Medium (34-66%) Necrotic Tissue: Adherent Slough Adherent 02/09/2020 Exposed Structures: Fat Layer (Subcutaneous Tissue): Fat Layer (Subcutaneous Tissue): Fat Layer (Subcutaneous Tissue): Yes Yes Yes Fascia: No Fascia: No Fascia: No Tendon: No Tendon: No Tendon: No Muscle: No Muscle: No Muscle: No Joint: No Joint: No Joint: No Bone: No Bone: No Bone: No Epithelialization: None Large (67-100%) None Wound Number: 7 N/A N/A Photos: N/A N/A Wound Location: Left, Posterior Lower Leg N/A N/A Wounding Event: Gradually Appeared N/A N/A Primary Etiology: Diabetic Wound/Ulcer of the Lower N/A N/A Extremity Comorbid History: Congestive Heart Failure, N/A N/A Hypertension, Type II Diabetes Date Acquired: 02/07/2020 N/A N/A Weeks of Treatment: 6 N/A N/A Wound Status: Open N/A N/A Measurements L x W x D (cm) 1x0.5x0.1 N/A N/A Area (cm) : 0.393 N/A N/A Volume (cm) : 0.039 N/A N/A % Reduction in Area: 54.50% N/A N/A % Reduction in Volume: 84.90%  N/A N/A Classification: Grade 2 N/A N/A Exudate Amount: Small N/A N/A Exudate Type: Serous N/A N/A Exudate Color: amber N/A N/A Wound Margin: N/A N/A N/A Keng, Willo ([employer]) Granulation Amount: Medium (34-66%) N/A N/A Granulation Quality: Red, Pink N/A N/A Necrotic Amount: Medium (34-66%) N/A N/A Necrotic Tissue: Adherent Slough N/A N/A Exposed Structures: Fat Layer (Subcutaneous Tissue): N/A N/A Yes Fascia: No Tendon: No Muscle: No Joint: No Bone: No Epithelialization: None N/A N/A Treatment Notes Electronic Signature(s) Signed: 11/30/2020 8:10:37 AM By: [id number] RN Entered By: 01/30/2021 on 11/26/2020 14:52:32 Yevonne Pax (01/26/2021) -------------------------------------------------------------------------------- Multi-Disciplinary Care Plan Details Patient Name: Breanna Bennett Date of Service: 11/26/2020 2:15 PM Medical Record Number: Breanna Bennett  Patient Account Number: [id number] Date of Birth/Sex: 1956/04/27 (65 y.o. F) Treating RN: Yevonne Pax Primary Care Ariza Evans: Blane Ohara Other Clinician: Lolita Cram Referring Lynda Capistran: Blane Ohara Treating Suhaila Troiano/Extender: Rowan Blase in Treatment: 6 Active Inactive Electronic Signature(s) Signed: 11/30/2020 8:10:37 AM By: Yevonne Pax RN Entered By: Yevonne Pax on 11/26/2020 14:52:15 Breanna Bennett ([id number]) -------------------------------------------------------------------------------- Pain Assessment Details Patient Name: Breanna Bennett Date of Service: 11/26/2020 2:15 PM Medical Record Number: [id number] Patient Account Number: [id number] Date of Birth/Sex: 07-30-1956 (64 y.o. F) Treating RN: Yevonne Pax Primary Care Trentin Knappenberger: Blane Ohara Other Clinician: Lolita Cram Referring Arizona Sorn: Blane Ohara Treating Abelino Tippin/Extender: Rowan Blase in Treatment: 6 Active Problems Location of Pain Severity and Description of Pain Patient Has Paino Yes Site Locations Rate  the pain. Current Pain Level: 6 Pain Management and Medication Current Pain Management: Electronic Signature(s) Signed: 11/26/2020 4:17:01 PM By: Lolita Cram Signed: 11/30/2020 8:10:37 AM By: Yevonne Pax RN Entered By: Lolita Cram on 11/26/2020 14:04:07 Breanna Bennett ([id number]) -------------------------------------------------------------------------------- Patient/Caregiver Education Details Patient Name: Breanna Bennett Date of Service: 11/26/2020 2:15 PM Medical Record Number: [id number] Patient Account Number: [id number] Date of Birth/Gender: 03/30/1956 (64 y.o. F) Treating RN: Yevonne Pax Primary Care Physician: Blane Ohara Other Clinician: Lolita Cram Referring Physician: Blane Ohara Treating Physician/Extender: Rowan Blase in Treatment: 6 Education Assessment Education Provided To: Patient Education Topics Provided Wound/Skin Impairment: Methods: Explain/Verbal Responses: State content correctly Electronic Signature(s) Signed: 11/30/2020 8:10:37 AM By: Yevonne Pax RN Entered By: Yevonne Pax on 11/26/2020 14:56:37 Breanna Bennett ([id number]) -------------------------------------------------------------------------------- Wound Assessment Details Patient Name: Breanna Bennett Date of Service: 11/26/2020 2:15 PM Medical Record Number: [id number] Patient Account Number: [id number] Date of Birth/Sex: Jan 23, 1956 (64 y.o. F) Treating RN: Yevonne Pax Primary Care Amaria Mundorf: Blane Ohara Other Clinician: Lolita Cram Referring Tricia Pledger: Blane Ohara Treating Maxxwell Edgett/Extender: Rowan Blase in Treatment: 6 Wound Status Wound Number: 1 Primary Etiology: Diabetic Wound/Ulcer of the Lower Extremity Wound Location: Right, Proximal Lower Leg Wound Status: Open Wounding Event: Gradually Appeared Comorbid Congestive Heart Failure, Hypertension, Type II History: Diabetes Date Acquired: 08/26/2019 Weeks Of Treatment: 6 Clustered Wound:  No Photos Wound Measurements Length: (cm) 0 Width: (cm) 0 Depth: (cm) 0 Area: (cm) 0 Volume: (cm) 0 % Reduction in Area: 100% % Reduction in Volume: 100% Epithelialization: None Tunneling: No Undermining: No Wound Description Classification: Grade 2 Exudate Amount: None Present Foul Odor After Cleansing: No Slough/Fibrino No Wound Bed Granulation Amount: None Present (0%) Exposed Structure Necrotic Amount: None Present (0%) Fascia Exposed: No Fat Layer (Subcutaneous Tissue) Exposed: No Tendon Exposed: No Muscle Exposed: No Joint Exposed: No Bone Exposed: No Electronic Signature(s) Signed: 11/30/2020 8:10:37 AM By: Yevonne Pax RN Entered By: Yevonne Pax on 11/26/2020 14:54:05 Breanna Bennett ([id number]) -------------------------------------------------------------------------------- Wound Assessment Details Patient Name: Breanna Bennett Date of Service: 11/26/2020 2:15 PM Medical Record Number: [id number] Patient Account Number: [id number] Date of Birth/Sex: June 14, 1956 (64 y.o. F) Treating RN: Yevonne Pax Primary Care Astria Jordahl: Blane Ohara Other Clinician: Lolita Cram Referring Knute Mazzuca: Blane Ohara Treating Cloma Rahrig/Extender: Rowan Blase in Treatment: 6 Wound Status Wound Number: 2 Primary Etiology: Diabetic Wound/Ulcer of the Lower Extremity Wound Location: Right Lower Leg Wound Status: Open Wounding Event: Gradually Appeared Comorbid Congestive Heart Failure, Hypertension, Type II History: Diabetes Date Acquired: 08/26/2019 Weeks Of Treatment: 6 Clustered Wound: No Photos Wound Measurements Length: (cm) 6.52 % Reduc Width: (cm) 5 % Reduc Depth: (cm) 0.2 Epithel Area: (cm) 25.604 Tunnel Volume: (cm) 5.121 Underm tion  in Area: -262.6% tion in Volume: -20.9% ialization: None ing: No ining: No Wound Description Classification: Grade 2 Foul O Exudate Amount: Large Slough Exudate Type: Serous Exudate Color: amber dor After Cleansing:  No /Fibrino Yes Wound Bed Granulation Amount: Small (1-33%) Exposed Structure Granulation Quality: Red, Pink Fascia Exposed: No Necrotic Amount: Large (67-100%) Fat Layer (Subcutaneous Tissue) Exposed: Yes Necrotic Quality: Adherent Slough Tendon Exposed: No Muscle Exposed: No Joint Exposed: No Bone Exposed: No Treatment Notes Wound #2 (Lower Leg) Wound Laterality: Right Cleanser Peri-Wound Care Topical Primary Dressing Nichelson, Rhema ([id number]) Silvercel Small 2x2 (in/in) Discharge Instruction: Apply Silvercel Small 2x2 (in/in) as instructed Secondary Dressing Kerlix 4.5 x 4.1 (in/yd) Discharge Instruction: Apply Kerlix 4.5 x 4.1 (in/yd) as instructed Xtrasorb Medium 4x5 (in/in) Discharge Instruction: Apply to wound as directed. Do not cut. Secured With 69M Medipore H Soft Cloth Surgical Tape, 2x2 (in/yd) Coban Cohesive Bandage 4x5 (yds) Stretched Discharge Instruction: Apply Coban as directed. Compression Wrap Compression Stockings Add-Ons Electronic Signature(s) Signed: 11/26/2020 4:17:01 PM By: Lolita Cram Signed: 11/30/2020 8:10:37 AM By: Yevonne Pax RN Entered By: Lolita Cram on 11/26/2020 14:31:52 Breanna Bennett ([id number]) -------------------------------------------------------------------------------- Wound Assessment Details Patient Name: Breanna Bennett Date of Service: 11/26/2020 2:15 PM Medical Record Number: [id number] Patient Account Number: [id number] Date of Birth/Sex: [birth year] (64 y.o. F) Treating RN: Yevonne Pax Primary Care Deone Omahoney: Blane Ohara Other Clinician: Lolita Cram Referring Ahtziry Saathoff: Blane Ohara Treating Oluwatobiloba Martin/Extender: Rowan Blase in Treatment: 6 Wound Status Wound Number: 3 Primary Etiology: Diabetic Wound/Ulcer of the Lower Extremity Wound Location: Right, Distal Lower Leg Wound Status: Open Wounding Event: Gradually Appeared Comorbid Congestive Heart Failure, Hypertension, Type II History:  Diabetes Date Acquired: 08/26/2019 Weeks Of Treatment: 6 Clustered Wound: No Photos Wound Measurements Length: (cm) 2.3 % Red Width: (cm) 1.9 % Red Depth: (cm) 0.3 Epith Area: (cm) 3.432 Tunn Volume: (cm) 1.03 Unde uction in Area: 40% uction in Volume: 55% elialization: None eling: No rmining: No Wound Description Classification: Grade 2 Foul Exudate Amount: Large Slou Exudate Type: Serous Exudate Color: amber Odor After Cleansing: No gh/Fibrino Yes Wound Bed Granulation Amount: Small (1-33%) Exposed Structure Granulation Quality: Red, Pink Fascia Exposed: No Necrotic Amount: Large (67-100%) Fat Layer (Subcutaneous Tissue) Exposed: Yes Necrotic Quality: Eschar, Adherent Slough Tendon Exposed: No Muscle Exposed: No Joint Exposed: No Bone Exposed: No Treatment Notes Wound #3 (Lower Leg) Wound Laterality: Right, Distal Cleanser Peri-Wound Care Topical Primary Dressing Free, Shar ([id number]) Silvercel Small 2x2 (in/in) Discharge Instruction: Apply Silvercel Small 2x2 (in/in) as instructed Secondary Dressing Kerlix 4.5 x 4.1 (in/yd) Discharge Instruction: Apply Kerlix 4.5 x 4.1 (in/yd) as instructed Xtrasorb Medium 4x5 (in/in) Discharge Instruction: Apply to wound as directed. Do not cut. Secured With 69M Medipore H Soft Cloth Surgical Tape, 2x2 (in/yd) Coban Cohesive Bandage 4x5 (yds) Stretched Discharge Instruction: Apply Coban as directed. Compression Wrap Compression Stockings Add-Ons Electronic Signature(s) Signed: 11/26/2020 4:17:01 PM By: Lolita Cram Signed: 11/30/2020 8:10:37 AM By: Yevonne Pax RN Entered By: Lolita Cram on 11/26/2020 14:32:35 Breanna Bennett ([id number]) -------------------------------------------------------------------------------- Wound Assessment Details Patient Name: Breanna Bennett Date of Service: 11/26/2020 2:15 PM Medical Record Number: [id number] Patient Account Number: [id number] Date of Birth/Sex: Oct 09, 1955 (64  y.o. F) Treating RN: Yevonne Pax Primary Care Sudais Banghart: Blane Ohara Other Clinician: Lolita Cram Referring Caroline Longie: Blane Ohara Treating Jasilyn Holderman/Extender: Rowan Blase in Treatment: 6 Wound Status Wound Number: 4 Primary Etiology: Diabetic Wound/Ulcer of the Lower Extremity Wound Location: Left Lower Leg Wound Status: Open Wounding Event: Gradually Appeared  Comorbid Congestive Heart Failure, Hypertension, Type II History: Diabetes Date Acquired: 08/26/2019 Weeks Of Treatment: 6 Clustered Wound: No Photos Wound Measurements Length: (cm) 5 % Reduc Width: (cm) 7.5 % Reduc Depth: (cm) 0.3 Epithel Area: (cm) 29.452 Tunnel Volume: (cm) 8.836 Underm tion in Area: -290.6% tion in Volume: -290.6% ialization: None ing: No ining: No Wound Description Classification: Grade 2 Foul O Exudate Amount: Large Slough Exudate Type: Serous Exudate Color: amber dor After Cleansing: No /Fibrino Yes Wound Bed Granulation Amount: Small (1-33%) Exposed Structure Granulation Quality: Pink, Pale Fascia Exposed: No Necrotic Amount: Large (67-100%) Fat Layer (Subcutaneous Tissue) Exposed: Yes Necrotic Quality: Adherent Slough Tendon Exposed: No Muscle Exposed: No Joint Exposed: No Bone Exposed: No Treatment Notes Wound #4 (Lower Leg) Wound Laterality: Left Cleanser Peri-Wound Care Topical Primary Dressing Doescher, Hero ([id number]) Silvercel Small 2x2 (in/in) Discharge Instruction: Apply Silvercel Small 2x2 (in/in) as instructed Secondary Dressing Kerlix 4.5 x 4.1 (in/yd) Discharge Instruction: Apply Kerlix 4.5 x 4.1 (in/yd) as instructed Xtrasorb Medium 4x5 (in/in) Discharge Instruction: Apply to wound as directed. Do not cut. Secured With 47M Medipore H Soft Cloth Surgical Tape, 2x2 (in/yd) Coban Cohesive Bandage 4x5 (yds) Stretched Discharge Instruction: Apply Coban as directed. Compression Wrap Compression Stockings Add-Ons Electronic Signature(s) Signed:  11/26/2020 4:17:01 PM By: Lolita Cram Signed: 11/30/2020 8:10:37 AM By: Yevonne Pax RN Entered By: Lolita Cram on 11/26/2020 14:28:58 Breanna Bennett ([id number]) -------------------------------------------------------------------------------- Wound Assessment Details Patient Name: Breanna Bennett Date of Service: 11/26/2020 2:15 PM Medical Record Number: [id number] Patient Account Number: [id number] Date of Birth/Sex: 11-28-1955 (64 y.o. F) Treating RN: Yevonne Pax Primary Care Chenee Munns: Blane Ohara Other Clinician: Lolita Cram Referring Amber Williard: Blane Ohara Treating Rayshard Schirtzinger/Extender: Rowan Blase in Treatment: 6 Wound Status Wound Number: 5 Primary Etiology: Diabetic Wound/Ulcer of the Lower Extremity Wound Location: Right, Medial Lower Leg Wound Status: Open Wounding Event: Gradually Appeared Comorbid Congestive Heart Failure, Hypertension, Type II History: Diabetes Date Acquired: 02/07/2020 Weeks Of Treatment: 6 Clustered Wound: No Photos Wound Measurements Length: (cm) 7.5 % Redu Width: (cm) 4 % Redu Depth: (cm) 0.1 Epithe Area: (cm) 23.562 Tunne Volume: (cm) 2.356 Under ction in Area: -55.8% ction in Volume: 22.1% lialization: Large (67-100%) ling: No mining: No Wound Description Classification: Grade 2 Foul Wound Margin: Flat and Intact Sloug Exudate Amount: Large Exudate Type: Serous Exudate Color: amber Odor After Cleansing: No h/Fibrino Yes Wound Bed Granulation Amount: Medium (34-66%) Exposed Structure Granulation Quality: Red Fascia Exposed: No Necrotic Amount: Medium (34-66%) Fat Layer (Subcutaneous Tissue) Exposed: Yes Necrotic Quality: Adherent Slough Tendon Exposed: No Muscle Exposed: No Joint Exposed: No Bone Exposed: No Treatment Notes Wound #5 (Lower Leg) Wound Laterality: Right, Medial Cleanser Peri-Wound Care Topical Bortle, Zeah ([id number]) Primary Dressing Silvercel Small 2x2 (in/in) Discharge Instruction:  Apply Silvercel Small 2x2 (in/in) as instructed Secondary Dressing Kerlix 4.5 x 4.1 (in/yd) Discharge Instruction: Apply Kerlix 4.5 x 4.1 (in/yd) as instructed Xtrasorb Medium 4x5 (in/in) Discharge Instruction: Apply to wound as directed. Do not cut. Secured With 47M Medipore H Soft Cloth Surgical Tape, 2x2 (in/yd) Coban Cohesive Bandage 4x5 (yds) Stretched Discharge Instruction: Apply Coban as directed. Compression Wrap Compression Stockings Add-Ons Electronic Signature(s) Signed: 11/26/2020 4:17:01 PM By: Lolita Cram Signed: 11/30/2020 8:10:37 AM By: Yevonne Pax RN Entered By: Lolita Cram on 11/26/2020 14:30:20 Breanna Bennett ([id number]) -------------------------------------------------------------------------------- Wound Assessment Details Patient Name: Breanna Bennett Date of Service: 11/26/2020 2:15 PM Medical Record Number: [id number] Patient Account Number: [id number] Date of Birth/Sex: 12-Jun-1956 (64 y.o. F) Treating RN: Hansel Feinstein  Primary Care Vickki Igou: Blane Ohara Other Clinician: Lolita Cram Referring Sharvi Mooneyhan: Blane Ohara Treating Oreste Majeed/Extender: Rowan Blase in Treatment: 6 Wound Status Wound Number: 6 Primary Etiology: Diabetic Wound/Ulcer of the Lower Extremity Wound Location: Left, Medial Lower Leg Wound Status: Open Wounding Event: Gradually Appeared Comorbid Congestive Heart Failure, Hypertension, Type II History: Diabetes Date Acquired: 02/07/2020 Weeks Of Treatment: 6 Clustered Wound: No Photos Wound Measurements Length: (cm) 1 % Redu Width: (cm) 0.7 % Redu Depth: (cm) 0.1 Epithe Area: (cm) 0.55 Tunne Volume: (cm) 0.055 Under ction in Area: -180.6% ction in Volume: 6.8% lialization: None ling: No mining: No Wound Description Classification: Grade 2 Foul Exudate Amount: Medium Sloug Exudate Type: Serous Exudate Color: amber Odor After Cleansing: No h/Fibrino Yes Wound Bed Granulation Amount: Medium (34-66%) Exposed  Structure Granulation Quality: Red, Pink Fascia Exposed: No Necrotic Amount: Medium (34-66%) Fat Layer (Subcutaneous Tissue) Exposed: Yes Necrotic Quality: Adherent Slough Tendon Exposed: No Muscle Exposed: No Joint Exposed: No Bone Exposed: No Treatment Notes Wound #6 (Lower Leg) Wound Laterality: Left, Medial Cleanser Peri-Wound Care Topical Primary Dressing Cajuste, Stepheny ([id number]) Silvercel Small 2x2 (in/in) Discharge Instruction: Apply Silvercel Small 2x2 (in/in) as instructed Secondary Dressing Kerlix 4.5 x 4.1 (in/yd) Discharge Instruction: Apply Kerlix 4.5 x 4.1 (in/yd) as instructed Xtrasorb Medium 4x5 (in/in) Discharge Instruction: Apply to wound as directed. Do not cut. Secured With 69M Medipore H Soft Cloth Surgical Tape, 2x2 (in/yd) Coban Cohesive Bandage 4x5 (yds) Stretched Discharge Instruction: Apply Coban as directed. Compression Wrap Compression Stockings Add-Ons Electronic Signature(s) Signed: 11/27/2020 10:57:38 AM By: Hansel Feinstein Entered By: Hansel Feinstein on 11/26/2020 14:40:23 Breanna Bennett ([id number]) -------------------------------------------------------------------------------- Wound Assessment Details Patient Name: Breanna Bennett Date of Service: 11/26/2020 2:15 PM Medical Record Number: [id number] Patient Account Number: [id number] Date of Birth/Sex: 07-06-1956 (64 y.o. F) Treating RN: Yevonne Pax Primary Care Taaj Hurlbut: Blane Ohara Other Clinician: Lolita Cram Referring Juwana Thoreson: Blane Ohara Treating Nechemia Chiappetta/Extender: Rowan Blase in Treatment: 6 Wound Status Wound Number: 7 Primary Etiology: Diabetic Wound/Ulcer of the Lower Extremity Wound Location: Left, Posterior Lower Leg Wound Status: Open Wounding Event: Gradually Appeared Comorbid Congestive Heart Failure, Hypertension, Type II History: Diabetes Date Acquired: 02/07/2020 Weeks Of Treatment: 6 Clustered Wound: No Photos Wound Measurements Length: (cm) 1 %  R Width: (cm) 0.5 % R Depth: (cm) 0.1 Epi Area: (cm) 0.393 Tu Volume: (cm) 0.039 Un eduction in Area: 54.5% eduction in Volume: 84.9% thelialization: None nneling: No dermining: No Wound Description Classification: Grade 2 Exudate Amount: Small Exudate Type: Serous Exudate Color: amber Wound Bed Granulation Amount: Medium (34-66%) Exposed Structure Granulation Quality: Red, Pink Fascia Exposed: No Necrotic Amount: Medium (34-66%) Fat Layer (Subcutaneous Tissue) Exposed: Yes Necrotic Quality: Adherent Slough Tendon Exposed: No Muscle Exposed: No Joint Exposed: No Bone Exposed: No Treatment Notes Wound #7 (Lower Leg) Wound Laterality: Left, Posterior Cleanser Peri-Wound Care Topical Primary Dressing Vanvleck, Lennette ([id number]) Silvercel Small 2x2 (in/in) Discharge Instruction: Apply Silvercel Small 2x2 (in/in) as instructed Secondary Dressing Kerlix 4.5 x 4.1 (in/yd) Discharge Instruction: Apply Kerlix 4.5 x 4.1 (in/yd) as instructed Xtrasorb Medium 4x5 (in/in) Discharge Instruction: Apply to wound as directed. Do not cut. Secured With 69M Medipore H Soft Cloth Surgical Tape, 2x2 (in/yd) Coban Cohesive Bandage 4x5 (yds) Stretched Discharge Instruction: Apply Coban as directed. Compression Wrap Compression Stockings Add-Ons Electronic Signature(s) Signed: 11/26/2020 4:17:01 PM By: Lolita Cram Signed: 11/30/2020 8:10:37 AM By: Yevonne Pax RN Entered By: Lolita Cram on 11/26/2020 14:31:02 Breanna Bennett ([id number]) -------------------------------------------------------------------------------- Vitals Details Patient Name:  Breanna Bennett Date of Service: 11/26/2020 2:15 PM Medical Record Number: [id number] Patient Account Number: [id number] Date of Birth/Sex: April 01, 1956 (64 y.o. F) Treating RN: Yevonne Pax Primary Care Hilario Robarts: Blane Ohara Other Clinician: Lolita Cram Referring Cookie Pore: Blane Ohara Treating Chaniah Cisse/Extender: Rowan Blase in Treatment: 6 Vital Signs Time Taken: 14:03 Temperature (F): 97.6 Height (in): 68 Pulse (bpm): 81 Weight (lbs): 160 Respiratory Rate (breaths/min): 18 Body Mass Index (BMI): 24.3 Blood Pressure (mmHg): 109/77 Reference Range: 80 - 120 mg / dl Electronic Signature(s) Signed: 11/26/2020 4:17:01 PM By: Lolita Cram Entered By: Lolita Cram on 11/26/2020 14:04:00

## 2020-12-01 LAB — BASIC METABOLIC PANEL
BUN/Creatinine Ratio: 26 (ref 12–28)
BUN: 20 mg/dL (ref 8–27)
CO2: 20 mmol/L (ref 20–29)
Calcium: 8.6 mg/dL — ABNORMAL LOW (ref 8.7–10.3)
Chloride: 99 mmol/L (ref 96–106)
Creatinine, Ser: 0.76 mg/dL (ref 0.57–1.00)
Glucose: 134 mg/dL — ABNORMAL HIGH (ref 65–99)
Potassium: 4 mmol/L (ref 3.5–5.2)
Sodium: 135 mmol/L (ref 134–144)
eGFR: 87 mL/min/{1.73_m2} (ref >59)

## 2020-12-03 ENCOUNTER — Encounter: Payer: 59 | Admitting: Physician Assistant

## 2020-12-03 ENCOUNTER — Other Ambulatory Visit: Payer: Self-pay

## 2020-12-03 DIAGNOSIS — E11622 Type 2 diabetes mellitus with other skin ulcer: Secondary | ICD-10-CM | POA: Diagnosis not present

## 2020-12-03 NOTE — Progress Notes (Addendum)
[name], BENTSEN ([id number]) Visit Report for 12/03/2020 Chief Complaint Document Details Patient Name: Breanna Bennett, Breanna Bennett Date of Service: 12/03/2020 3:30 PM Medical Record Number: [id number] Patient Account Number: [id number] Date of Birth/Sex: 1956-04-20 (65 y.o. F) Treating RN: Yevonne Pax Primary Care Provider: Blane Ohara Other Clinician: Lolita Cram Referring Provider: Blane Ohara Treating Provider/Extender: Rowan Blase in Treatment: 7 Information Obtained from: Patient Chief Complaint 10/10/2020; patient is here with wounds on her bilateral lower legs Electronic Signature(s) Signed: 12/03/2020 3:41:07 PM By: Lenda Kelp PA-C Entered By: Lenda Kelp on 12/03/2020 15:41:07 Breanna Bennett ([id number]) -------------------------------------------------------------------------------- HPI Details Patient Name: Breanna Bennett Date of Service: 12/03/2020 3:30 PM Medical Record Number: [id number] Patient Account Number: [id number] Date of Birth/Sex: 1956-03-12 (64 y.o. F) Treating RN: Yevonne Pax Primary Care Provider: Blane Ohara Other Clinician: Lolita Cram Referring Provider: Blane Ohara Treating Provider/Extender: Rowan Blase in Treatment: 7 History of Present Illness HPI Description: ADMISSION 10/10/2020 This is a pleasant woman from [facility]. She is here accompanied by her daughter for review of wounds on her bilateral lower legs. Seems like these are chronic and have been present for the most part for about a year. She had compression stockings at one point but I am not sure that she is actually been wearing them. She has about 4 open areas on the left and more substantially for open areas on the right. She is a diabetic but is not on any current treatment. Her most other substantive problem is a severe cardiomyopathy with an ejection fraction less than 20%. Her daughter tells me that she runs a generally low blood pressure and  she only takes 10 mg of Lasix with an adjustment if her blood pressure is over a certain value giving it additional 10 mg. It does not seem like she has gained weight recently she says he is at 160 pounds and for her this is actually a reduction. They have been using Medihoney and Neosporin to this area. No recent compression wrap use. There are 4 open areas on the left and 4 open areas on the right. ABIs in our clinic were 1.35 on the right and 1.22 on the left 11/26/2020 on evaluation today patient appears to be doing poorly in regard to her lower extremities. With that being said the biggest issue currently seems to be really that she is having issues with extreme edema of the lower extremities and Dr. Leanord Hawking was concerned about the fact that to be honest the patient really needs to be seen by cardiology to see if there is anything they can do to help diurese her more as he was concerned about doing too strong of a compression. For that reason we have been doing Kerlix and Coban only for now patient has not seen her cardiologist we have not seen her since February 16 of this year. Unfortunately her wounds appear to be a bit worse than they were previous. 12/03/20 upon evaluation today patient appears to be doing well with regard to her wounds. She has been tolerating the dressing changes without complication. Fortunately there does not appear to be any signs of infection. She still has a tremendous amount of edema but this is being managed by her cardiologist in fact he did put her on some medications yesterday. This included increasing her furosemide to 20 mg 2 times a day. He is also lowering her blood pressure medication to compensate. Obviously this is a very fine balance with this patient.  It was actually mentioned in the note that the patient may need to consider palliative care. Obviously were trying to avoid making anything worse while still healing her wounds. Electronic  Signature(s) Signed: 12/03/2020 4:32:39 PM By: Lenda KelpStone III, Iara Monds PA-C Entered By: Lenda KelpStone III, Faust Thorington on 12/03/2020 16:32:38 Breanna Bennett, Breanna Bennett ([id number]) -------------------------------------------------------------------------------- Physical Exam Details Patient Name: Breanna Bennett, Breanna Bennett Date of Service: 12/03/2020 3:30 PM Medical Record Number: [id number] Patient Account Number: [id number] Date of Birth/Sex: 1955/10/31 (64 y.o. F) Treating RN: Yevonne PaxEpps, Carrie Primary Care Provider: Blane Oharaox , Kirsten Other Clinician: Lolita CramBurnette, Kyara Referring Provider: Blane Oharaox , Kirsten Treating Provider/Extender: Rowan BlaseStone, Debrah Granderson Weeks in Treatment: 7 Constitutional Well-nourished and well-hydrated in no acute distress. Respiratory normal breathing without difficulty. Psychiatric this patient is able to make decisions and demonstrates good insight into disease process. Alert and Oriented x 3. pleasant and cooperative. Notes Upon inspection patient's wound bed x-ray showed signs of good granulation epithelization at this point. There does not appear to be any signs of infection which is great news and overall very pleased with where things stand. No fevers, chills, nausea, vomiting, or diarrhea. She does still have quite a bit of edema in her lower extremities but I am hoping the medication change will make a difference here. Electronic Signature(s) Signed: 12/03/2020 4:33:01 PM By: Lenda KelpStone III, Shontell Prosser PA-C Entered By: Lenda KelpStone III, Oaklen Thiam on 12/03/2020 16:33:00 Breanna Bennett, Breanna Bennett ([id number]) -------------------------------------------------------------------------------- Physician Orders Details Patient Name: Breanna Bennett, Breanna Bennett Date of Service: 12/03/2020 3:30 PM Medical Record Number: [id number] Patient Account Number: [id number] Date of Birth/Sex: 1955/10/31 (64 y.o. F) Treating RN: Yevonne PaxEpps, Carrie Primary Care Provider: Blane Oharaox , Kirsten Other Clinician: Lolita CramBurnette, Kyara Referring Provider: Blane Oharaox , Kirsten Treating Provider/Extender: Rowan BlaseStone,  Kimbery Harwood Weeks in Treatment: 7 Verbal / Phone Orders: No Diagnosis Coding ICD-10 Coding Code Description E11.622 Type 2 diabetes mellitus with other skin ulcer I87.323 Chronic venous hypertension (idiopathic) with inflammation of bilateral lower extremity L97.828 Non-pressure chronic ulcer of other part of left lower leg with other specified severity L97.818 Non-pressure chronic ulcer of other part of right lower leg with other specified severity I50.22 Chronic systolic (congestive) heart failure Follow-up Appointments o Return Appointment in 1 week. Bathing/ Shower/ Hygiene o May shower; gently cleanse wound with antibacterial soap, rinse and pat dry prior to dressing wounds Edema Control - Lymphedema / Segmental Compressive Device / Other Bilateral Lower Extremities o Elevate, Exercise Daily and Avoid Standing for Long Periods of Time. o Elevate legs to the level of the heart and pump ankles as often as possible o Elevate leg(s) parallel to the floor when sitting. Wound Treatment Wound #2 - Lower Leg Wound Laterality: Right Primary Dressing: Silvercel Small 2x2 (in/in) (Generic) 1 x Per Day/30 Days Discharge Instructions: Apply Silvercel Small 2x2 (in/in) as instructed Secondary Dressing: Kerlix 4.5 x 4.1 (in/yd) (Generic) 1 x Per Day/30 Days Discharge Instructions: Apply Kerlix 4.5 x 4.1 (in/yd) as instructed Secondary Dressing: Xtrasorb Medium 4x5 (in/in) (Generic) 1 x Per Day/30 Days Discharge Instructions: Apply to wound as directed. Do not cut. Secured With: 58M Medipore H Soft Cloth Surgical Tape, 2x2 (in/yd) (Generic) 1 x Per Day/30 Days Secured With: Coban Cohesive Bandage 4x5 (yds) Stretched (Generic) 1 x Per Day/30 Days Discharge Instructions: Apply Coban as directed. Wound #3 - Lower Leg Wound Laterality: Right, Distal Primary Dressing: Silvercel Small 2x2 (in/in) (Generic) 1 x Per Day/30 Days Discharge Instructions: Apply Silvercel Small 2x2 (in/in) as  instructed Secondary Dressing: Kerlix 4.5 x 4.1 (in/yd) (Generic) 1 x Per Day/30 Days Discharge Instructions: Apply from just  behind toes to patella knotch lightly Secondary Dressing: Xtrasorb Medium 4x5 (in/in) (Generic) 1 x Per Day/30 Days Discharge Instructions: Apply to wound as directed. Do not cut. Secured With: 55M Medipore H Soft Cloth Surgical Tape, 2x2 (in/yd) (Generic) 1 x Per Day/30 Days Secured With: Coban Cohesive Bandage 4x5 (yds) Stretched (Generic) 1 x Per Day/30 Days Discharge Instructions: Apply from just behind toes to patella knotch lightly Wound #4 - Lower Leg Wound Laterality: Left Breanna Bennett, Breanna Bennett ([id number]) Primary Dressing: Silvercel Small 2x2 (in/in) (Generic) 1 x Per Day/30 Days Discharge Instructions: Apply Silvercel Small 2x2 (in/in) as instructed Secondary Dressing: Kerlix 4.5 x 4.1 (in/yd) (Generic) 1 x Per Day/30 Days Discharge Instructions: Apply from just behind toes to patella knotch lightly Secondary Dressing: Xtrasorb Medium 4x5 (in/in) (Generic) 1 x Per Day/30 Days Discharge Instructions: Apply to wound as directed. Do not cut. Secured With: 55M Medipore H Soft Cloth Surgical Tape, 2x2 (in/yd) (Generic) 1 x Per Day/30 Days Secured With: Coban Cohesive Bandage 4x5 (yds) Stretched (Generic) 1 x Per Day/30 Days Discharge Instructions: Apply from just behind toes to patella knotch lightly Wound #5 - Lower Leg Wound Laterality: Right, Medial Primary Dressing: Silvercel Small 2x2 (in/in) (Generic) 1 x Per Day/30 Days Discharge Instructions: Apply Silvercel Small 2x2 (in/in) as instructed Secondary Dressing: Kerlix 4.5 x 4.1 (in/yd) (Generic) 1 x Per Day/30 Days Discharge Instructions: Apply from just behind toes to patella knotch lightly Secondary Dressing: Xtrasorb Medium 4x5 (in/in) 1 x Per Day/30 Days Discharge Instructions: Apply to wound as directed. Do not cut. Secured With: 55M Medipore H Soft Cloth Surgical Tape, 2x2 (in/yd) (Generic) 1 x Per Day/30  Days Secured With: Coban Cohesive Bandage 4x5 (yds) Stretched (Generic) 1 x Per Day/30 Days Discharge Instructions: Apply from just behind toes to patella knotch lightly Wound #6 - Lower Leg Wound Laterality: Left, Medial Primary Dressing: Silvercel Small 2x2 (in/in) (Generic) 1 x Per Day/30 Days Discharge Instructions: Apply Silvercel Small 2x2 (in/in) as instructed Secondary Dressing: Kerlix 4.5 x 4.1 (in/yd) (Generic) 1 x Per Day/30 Days Discharge Instructions: Apply from just behind toes to patella knotch lightly Secondary Dressing: Xtrasorb Medium 4x5 (in/in) (Generic) 1 x Per Day/30 Days Discharge Instructions: Apply to wound as directed. Do not cut. Secured With: 55M Medipore H Soft Cloth Surgical Tape, 2x2 (in/yd) (Generic) 1 x Per Day/30 Days Secured With: Coban Cohesive Bandage 4x5 (yds) Stretched (Generic) 1 x Per Day/30 Days Discharge Instructions: Apply from just behind toes to patella knotch lightly Wound #7 - Lower Leg Wound Laterality: Left, Posterior Primary Dressing: Silvercel Small 2x2 (in/in) (Generic) 1 x Per Day/30 Days Discharge Instructions: Apply Silvercel Small 2x2 (in/in) as instructed Secondary Dressing: Kerlix 4.5 x 4.1 (in/yd) (Generic) 1 x Per Day/30 Days Discharge Instructions: Apply from just behind toes to patella knotch lightly Secondary Dressing: Xtrasorb Medium 4x5 (in/in) (Generic) 1 x Per Day/30 Days Discharge Instructions: Apply to wound as directed. Do not cut. Secured With: 55M Medipore H Soft Cloth Surgical Tape, 2x2 (in/yd) (Generic) 1 x Per Day/30 Days Secured With: Coban Cohesive Bandage 4x5 (yds) Stretched (Generic) 1 x Per Day/30 Days Discharge Instructions: Apply from just behind toes to patella knotch lightly Electronic Signature(s) Signed: 12/03/2020 4:31:50 PM By: Lenda Kelp PA-C Signed: 12/03/2020 5:13:28 PM By: Yevonne Pax RN Entered By: Yevonne Pax on 12/03/2020 16:24:11 Breanna Bennett, Breanna Bennett ([id number]) Breanna Bennett, Breanna Bennett  ([id number]) -------------------------------------------------------------------------------- Problem List Details Patient Name: Breanna Bennett Date of Service: 12/03/2020 3:30 PM Medical Record Number: [id number] Patient Account Number: [id number] Date  of Birth/Sex: Aug 14, 1956 (64 y.o. F) Treating RN: Yevonne Pax Primary Care Provider: Blane Ohara Other Clinician: Lolita Cram Referring Provider: Blane Ohara Treating Provider/Extender: Rowan Blase in Treatment: 7 Active Problems ICD-10 Encounter Code Description Active Date MDM Diagnosis E11.622 Type 2 diabetes mellitus with other skin ulcer 10/10/2020 No Yes I87.323 Chronic venous hypertension (idiopathic) with inflammation of bilateral 10/10/2020 No Yes lower extremity L97.828 Non-pressure chronic ulcer of other part of left lower leg with other 10/10/2020 No Yes specified severity L97.818 Non-pressure chronic ulcer of other part of right lower leg with other 10/10/2020 No Yes specified severity I50.22 Chronic systolic (congestive) heart failure 10/10/2020 No Yes Inactive Problems Resolved Problems Electronic Signature(s) Signed: 12/03/2020 3:41:02 PM By: Lenda Kelp PA-C Entered By: Lenda Kelp on 12/03/2020 15:41:02 Breanna Bennett ([id number]) -------------------------------------------------------------------------------- Progress Note Details Patient Name: Breanna Bennett Date of Service: 12/03/2020 3:30 PM Medical Record Number: [id number] Patient Account Number: [id number] Date of Birth/Sex: 01/17/1956 (64 y.o. F) Treating RN: Yevonne Pax Primary Care Provider: Blane Ohara Other Clinician: Lolita Cram Referring Provider: Blane Ohara Treating Provider/Extender: Rowan Blase in Treatment: 7 Subjective Chief Complaint Information obtained from Patient 10/10/2020; patient is here with wounds on her bilateral lower legs History of Present Illness (HPI) ADMISSION 10/10/2020 This is a  pleasant woman from [facility]. She is here accompanied by her daughter for review of wounds on her bilateral lower legs. Seems like these are chronic and have been present for the most part for about a year. She had compression stockings at one point but I am not sure that she is actually been wearing them. She has about 4 open areas on the left and more substantially for open areas on the right. She is a diabetic but is not on any current treatment. Her most other substantive problem is a severe cardiomyopathy with an ejection fraction less than 20%. Her daughter tells me that she runs a generally low blood pressure and she only takes 10 mg of Lasix with an adjustment if her blood pressure is over a certain value giving it additional 10 mg. It does not seem like she has gained weight recently she says he is at 160 pounds and for her this is actually a reduction. They have been using Medihoney and Neosporin to this area. No recent compression wrap use. There are 4 open areas on the left and 4 open areas on the right. ABIs in our clinic were 1.35 on the right and 1.22 on the left 11/26/2020 on evaluation today patient appears to be doing poorly in regard to her lower extremities. With that being said the biggest issue currently seems to be really that she is having issues with extreme edema of the lower extremities and Dr. Leanord Hawking was concerned about the fact that to be honest the patient really needs to be seen by cardiology to see if there is anything they can do to help diurese her more as he was concerned about doing too strong of a compression. For that reason we have been doing Kerlix and Coban only for now patient has not seen her cardiologist we have not seen her since February 16 of this year. Unfortunately her wounds appear to be a bit worse than they were previous. 12/03/20 upon evaluation today patient appears to be doing well with regard to her wounds. She has been tolerating the  dressing changes without complication. Fortunately there does not appear to be any signs of infection. She  still has a tremendous amount of edema but this is being managed by her cardiologist in fact he did put her on some medications yesterday. This included increasing her furosemide to 20 mg 2 times a day. He is also lowering her blood pressure medication to compensate. Obviously this is a very fine balance with this patient. It was actually mentioned in the note that the patient may need to consider palliative care. Obviously were trying to avoid making anything worse while still healing her wounds. Objective Constitutional Well-nourished and well-hydrated in no acute distress. Vitals Time Taken: 3:31 PM, Height: 68 in, Weight: 160 lbs, BMI: 24.3, Temperature: 98.1 F, Pulse: 82 bpm, Respiratory Rate: 16 breaths/min, Blood Pressure: 83/59 mmHg. Respiratory normal breathing without difficulty. Psychiatric this patient is able to make decisions and demonstrates good insight into disease process. Alert and Oriented x 3. pleasant and cooperative. General Notes: Upon inspection patient's wound bed x-ray showed signs of good granulation epithelization at this point. There does not appear to be any signs of infection which is great news and overall very pleased with where things stand. No fevers, chills, nausea, vomiting, or diarrhea. She does still have quite a bit of edema in her lower extremities but I am hoping the medication change will make a difference here. Integumentary (Hair, Skin) Wound #2 status is Open. Original cause of wound was Gradually Appeared. The date acquired was: 08/26/2019. The wound has been in treatment 7 weeks. The wound is located on the Right Lower Leg. The wound measures 2cm length x 2.3cm width x 0.2cm depth; 3.613cm^2 area and Breanna Bennett, Breanna Bennett ([id number]) 0.723cm^3 volume. There is Fat Layer (Subcutaneous Tissue) exposed. There is a large amount of serous drainage  noted. There is small (1-33%) red, pink granulation within the wound bed. There is a large (67-100%) amount of necrotic tissue within the wound bed. Wound #3 status is Open. Original cause of wound was Gradually Appeared. The date acquired was: 08/26/2019. The wound has been in treatment 7 weeks. The wound is located on the Right,Distal Lower Leg. The wound measures 1cm length x 0.4cm width x 0.1cm depth; 0.314cm^2 area and 0.031cm^3 volume. There is Fat Layer (Subcutaneous Tissue) exposed. There is no tunneling or undermining noted. There is a large amount of serous drainage noted. There is small (1-33%) red, pink granulation within the wound bed. There is a large (67-100%) amount of necrotic tissue within the wound bed including Eschar. Wound #4 status is Open. Original cause of wound was Gradually Appeared. The date acquired was: 08/26/2019. The wound has been in treatment 7 weeks. The wound is located on the Left Lower Leg. The wound measures 4.5cm length x 7.8cm width x 0.2cm depth; 27.567cm^2 area and 5.513cm^3 volume. There is Fat Layer (Subcutaneous Tissue) exposed. There is no tunneling or undermining noted. There is a large amount of serous drainage noted. There is medium (34-66%) pink, pale granulation within the wound bed. There is a medium (34-66%) amount of necrotic tissue within the wound bed including Adherent Slough. Wound #5 status is Open. Original cause of wound was Gradually Appeared. The date acquired was: 02/07/2020. The wound has been in treatment 7 weeks. The wound is located on the Right,Medial Lower Leg. The wound measures 6.2cm length x 5cm width x 0.2cm depth; 24.347cm^2 area and 4.869cm^3 volume. There is Fat Layer (Subcutaneous Tissue) exposed. There is no tunneling or undermining noted. There is a large amount of serous drainage noted. The wound margin is flat and intact. There is medium (  34-66%) red granulation within the wound bed. There is a medium (34-66%) amount of  necrotic tissue within the wound bed including Adherent Slough. Wound #6 status is Open. Original cause of wound was Gradually Appeared. The date acquired was: 02/07/2020. The wound has been in treatment 7 weeks. The wound is located on the Left,Medial Lower Leg. The wound measures 0.5cm length x 0.5cm width x 0.1cm depth; 0.196cm^2 area and 0.02cm^3 volume. There is Fat Layer (Subcutaneous Tissue) exposed. There is no tunneling or undermining noted. There is a medium amount of serous drainage noted. There is medium (34-66%) red, pink granulation within the wound bed. There is a medium (34-66%) amount of necrotic tissue within the wound bed including Adherent Slough. Wound #7 status is Open. Original cause of wound was Gradually Appeared. The date acquired was: 02/07/2020. The wound has been in treatment 7 weeks. The wound is located on the Left,Posterior Lower Leg. The wound measures 0.5cm length x 0.5cm width x 0.1cm depth; 0.196cm^2 area and 0.02cm^3 volume. Assessment Active Problems ICD-10 Type 2 diabetes mellitus with other skin ulcer Chronic venous hypertension (idiopathic) with inflammation of bilateral lower extremity Non-pressure chronic ulcer of other part of left lower leg with other specified severity Non-pressure chronic ulcer of other part of right lower leg with other specified severity Chronic systolic (congestive) heart failure Plan Follow-up Appointments: Return Appointment in 1 week. Bathing/ Shower/ Hygiene: May shower; gently cleanse wound with antibacterial soap, rinse and pat dry prior to dressing wounds Edema Control - Lymphedema / Segmental Compressive Device / Other: Elevate, Exercise Daily and Avoid Standing for Long Periods of Time. Elevate legs to the level of the heart and pump ankles as often as possible Elevate leg(s) parallel to the floor when sitting. WOUND #2: - Lower Leg Wound Laterality: Right Primary Dressing: Silvercel Small 2x2 (in/in) (Generic) 1 x  Per Day/30 Days Discharge Instructions: Apply Silvercel Small 2x2 (in/in) as instructed Secondary Dressing: Kerlix 4.5 x 4.1 (in/yd) (Generic) 1 x Per Day/30 Days Discharge Instructions: Apply Kerlix 4.5 x 4.1 (in/yd) as instructed Secondary Dressing: Xtrasorb Medium 4x5 (in/in) (Generic) 1 x Per Day/30 Days Discharge Instructions: Apply to wound as directed. Do not cut. Secured With: 38M Medipore H Soft Cloth Surgical Tape, 2x2 (in/yd) (Generic) 1 x Per Day/30 Days Secured With: Coban Cohesive Bandage 4x5 (yds) Stretched (Generic) 1 x Per Day/30 Days Discharge Instructions: Apply Coban as directed. WOUND #3: - Lower Leg Wound Laterality: Right, Distal Primary Dressing: Silvercel Small 2x2 (in/in) (Generic) 1 x Per Day/30 Days Discharge Instructions: Apply Silvercel Small 2x2 (in/in) as instructed Secondary Dressing: Kerlix 4.5 x 4.1 (in/yd) (Generic) 1 x Per Day/30 Days Discharge Instructions: Apply from just behind toes to patella knotch lightly Secondary Dressing: Xtrasorb Medium 4x5 (in/in) (Generic) 1 x Per Day/30 Days Discharge Instructions: Apply to wound as directed. Do not cut. Breanna Bennett, Breanna Bennett ([id number]) Secured With: 38M Medipore H Soft Cloth Surgical Tape, 2x2 (in/yd) (Generic) 1 x Per Day/30 Days Secured With: Coban Cohesive Bandage 4x5 (yds) Stretched (Generic) 1 x Per Day/30 Days Discharge Instructions: Apply from just behind toes to patella knotch lightly WOUND #4: - Lower Leg Wound Laterality: Left Primary Dressing: Silvercel Small 2x2 (in/in) (Generic) 1 x Per Day/30 Days Discharge Instructions: Apply Silvercel Small 2x2 (in/in) as instructed Secondary Dressing: Kerlix 4.5 x 4.1 (in/yd) (Generic) 1 x Per Day/30 Days Discharge Instructions: Apply from just behind toes to patella knotch lightly Secondary Dressing: Xtrasorb Medium 4x5 (in/in) (Generic) 1 x Per Day/30 Days Discharge Instructions:  Apply to wound as directed. Do not cut. Secured With: 62M Medipore H Soft Cloth  Surgical Tape, 2x2 (in/yd) (Generic) 1 x Per Day/30 Days Secured With: Coban Cohesive Bandage 4x5 (yds) Stretched (Generic) 1 x Per Day/30 Days Discharge Instructions: Apply from just behind toes to patella knotch lightly WOUND #5: - Lower Leg Wound Laterality: Right, Medial Primary Dressing: Silvercel Small 2x2 (in/in) (Generic) 1 x Per Day/30 Days Discharge Instructions: Apply Silvercel Small 2x2 (in/in) as instructed Secondary Dressing: Kerlix 4.5 x 4.1 (in/yd) (Generic) 1 x Per Day/30 Days Discharge Instructions: Apply from just behind toes to patella knotch lightly Secondary Dressing: Xtrasorb Medium 4x5 (in/in) 1 x Per Day/30 Days Discharge Instructions: Apply to wound as directed. Do not cut. Secured With: 62M Medipore H Soft Cloth Surgical Tape, 2x2 (in/yd) (Generic) 1 x Per Day/30 Days Secured With: Coban Cohesive Bandage 4x5 (yds) Stretched (Generic) 1 x Per Day/30 Days Discharge Instructions: Apply from just behind toes to patella knotch lightly WOUND #6: - Lower Leg Wound Laterality: Left, Medial Primary Dressing: Silvercel Small 2x2 (in/in) (Generic) 1 x Per Day/30 Days Discharge Instructions: Apply Silvercel Small 2x2 (in/in) as instructed Secondary Dressing: Kerlix 4.5 x 4.1 (in/yd) (Generic) 1 x Per Day/30 Days Discharge Instructions: Apply from just behind toes to patella knotch lightly Secondary Dressing: Xtrasorb Medium 4x5 (in/in) (Generic) 1 x Per Day/30 Days Discharge Instructions: Apply to wound as directed. Do not cut. Secured With: 62M Medipore H Soft Cloth Surgical Tape, 2x2 (in/yd) (Generic) 1 x Per Day/30 Days Secured With: Coban Cohesive Bandage 4x5 (yds) Stretched (Generic) 1 x Per Day/30 Days Discharge Instructions: Apply from just behind toes to patella knotch lightly WOUND #7: - Lower Leg Wound Laterality: Left, Posterior Primary Dressing: Silvercel Small 2x2 (in/in) (Generic) 1 x Per Day/30 Days Discharge Instructions: Apply Silvercel Small 2x2 (in/in) as  instructed Secondary Dressing: Kerlix 4.5 x 4.1 (in/yd) (Generic) 1 x Per Day/30 Days Discharge Instructions: Apply from just behind toes to patella knotch lightly Secondary Dressing: Xtrasorb Medium 4x5 (in/in) (Generic) 1 x Per Day/30 Days Discharge Instructions: Apply to wound as directed. Do not cut. Secured With: 62M Medipore H Soft Cloth Surgical Tape, 2x2 (in/yd) (Generic) 1 x Per Day/30 Days Secured With: Coban Cohesive Bandage 4x5 (yds) Stretched (Generic) 1 x Per Day/30 Days Discharge Instructions: Apply from just behind toes to patella knotch lightly 1. I would recommend that we continue with the Curlex and Coban wraps that seems to have done well without doing anything extravagant as far as making anything worse. That is good news. 2. Muscle can recommend patient continue to elevate her legs much as possible to help keep edema under control. 3. I am also can suggest we use XtraSorb over top of the silver cell to catch the excessive drainage. 4. I am also can recommend the patient follow her cardiologist recommendations for edema control I think that still important. We will see patient back for reevaluation in 1 week here in the clinic. If anything worsens or changes patient will contact our office for additional recommendations. Electronic Signature(s) Signed: 12/03/2020 4:33:53 PM By: Lenda Kelp PA-C Entered By: Lenda Kelp on 12/03/2020 16:33:53 Breanna Bennett ([id number]) -------------------------------------------------------------------------------- SuperBill Details Patient Name: Breanna Bennett Date of Service: 12/03/2020 Medical Record Number: [id number] Patient Account Number: [id number] Date of Birth/Sex: Jul 21, 1956 (64 y.o. F) Treating RN: Yevonne Pax Primary Care Provider: Blane Ohara Other Clinician: Lolita Cram Referring Provider: Blane Ohara Treating Provider/Extender: Rowan Blase in Treatment: 7 Diagnosis  Coding ICD-10 Codes Code  Description E11.622 Type 2 diabetes mellitus with other skin ulcer I87.323 Chronic venous hypertension (idiopathic) with inflammation of bilateral lower extremity L97.828 Non-pressure chronic ulcer of other part of left lower leg with other specified severity L97.818 Non-pressure chronic ulcer of other part of right lower leg with other specified severity I50.22 Chronic systolic (congestive) heart failure Facility Procedures CPT4 Code: [id number] Description: [phone] - WOUND CARE VISIT-LEV 5 EST PT Modifier: Quantity: 1 Physician Procedures CPT4 Code: [id number] Description: 99204 - WC PHYS LEVEL 4 - NEW PT Modifier: Quantity: 1 CPT4 Code: Description: ICD-10 Diagnosis Description E11.622 Type 2 diabetes mellitus with other skin ulcer I87.323 Chronic venous hypertension (idiopathic) with inflammation of bilateral low L97.828 Non-pressure chronic ulcer of other part of left lower leg with  other speci L97.818 Non-pressure chronic ulcer of other part of right lower leg with other spec Modifier: er extremity fied severity ified severity Quantity: Electronic Signature(s) Signed: 12/03/2020 4:34:11 PM By: Lenda Kelp PA-C Previous Signature: 12/03/2020 4:31:50 PM Version By: Lenda Kelp PA-C Entered By: Lenda Kelp on 12/03/2020 16:34:10

## 2020-12-03 NOTE — Progress Notes (Signed)
Breanna Bennett, Breanna Bennett ([id number]) Visit Report for 12/03/2020 Arrival Information Details Patient Name: Breanna Bennett, Breanna Bennett Date of Service: 12/03/2020 3:30 PM Medical Record Number: [id number] Patient Account Number: [id number] Date of Birth/Sex: March 27, 1956 (65 y.o. F) Treating RN: Hansel Feinstein Primary Care Patrena Santalucia: Blane Ohara Other Clinician: Lolita Cram Referring Sasan Wilkie: Blane Ohara Treating Rayyan Burley/Extender: Rowan Blase in Treatment: 7 Visit Information History Since Last Visit Added or deleted any medications: Yes Patient Arrived: Wheel Chair Had a fall or experienced change in No Arrival Time: 15:28 activities of daily living that may affect Accompanied By: daughter risk of falls: Transfer Assistance: None Hospitalized since last visit: No Patient Identification Verified: Yes Pain Present Now: Yes Secondary Verification Process Completed: Yes Patient Requires Transmission-Based Precautions: No Patient Has Alerts: No Electronic Signature(s) Signed: 12/03/2020 5:13:28 PM By: Yevonne Pax RN Entered By: Yevonne Pax on 12/03/2020 16:24:24 Breanna Bennett ([id number]) -------------------------------------------------------------------------------- Clinic Level of Care Assessment Details Patient Name: Breanna Bennett Date of Service: 12/03/2020 3:30 PM Medical Record Number: [id number] Patient Account Number: [id number] Date of Birth/Sex: [birth year] (64 y.o. F) Treating RN: Yevonne Pax Primary Care Akasha Melena: Blane Ohara Other Clinician: Lolita Cram Referring Lemarcus Baggerly: Blane Ohara Treating Natilee Gauer/Extender: Rowan Blase in Treatment: 7 Clinic Level of Care Assessment Items TOOL 4 Quantity Score X - Use when only an EandM is performed on FOLLOW-UP visit 1 0 ASSESSMENTS - Nursing Assessment / Reassessment X - Reassessment of Co-morbidities (includes updates in patient status) 1 10 X- 1 5 Reassessment of Adherence to Treatment Plan ASSESSMENTS - Wound  and Skin Assessment / Reassessment []  - Simple Wound Assessment / Reassessment - one wound 0 X- 6 5 Complex Wound Assessment / Reassessment - multiple wounds []  - 0 Dermatologic / Skin Assessment (not related to wound area) ASSESSMENTS - Focused Assessment []  - Circumferential Edema Measurements - multi extremities 0 []  - 0 Nutritional Assessment / Counseling / Intervention []  - 0 Lower Extremity Assessment (monofilament, tuning fork, pulses) []  - 0 Peripheral Arterial Disease Assessment (using hand held doppler) ASSESSMENTS - Ostomy and/or Continence Assessment and Care []  - Incontinence Assessment and Management 0 []  - 0 Ostomy Care Assessment and Management (repouching, etc.) PROCESS - Coordination of Care X - Simple Patient / Family Education for ongoing care 1 15 []  - 0 Complex (extensive) Patient / Family Education for ongoing care []  - 0 Staff obtains , Records, Test Results / Process Orders []  - 0 Staff telephones HHA, Nursing Homes / Clarify orders / etc []  - 0 Routine Transfer to another Facility (non-emergent condition) []  - 0 Routine Hospital Admission (non-emergent condition) []  - 0 New Admissions / / Ordering NPWT, Apligraf, etc. []  - 0 Emergency Hospital Admission (emergent condition) X- 1 10 Simple Discharge Coordination []  - 0 Complex (extensive) Discharge Coordination PROCESS - Special Needs []  - Pediatric / Minor Patient Management 0 []  - 0 Isolation Patient Management []  - 0 Hearing / Language / Visual special needs []  - 0 Assessment of Community assistance (transportation, D/C planning, etc.) []  - 0 Additional assistance / Altered mentation []  - 0 Support Surface(s) Assessment (bed, cushion, seat, etc.) INTERVENTIONS - Wound Cleansing / Measurement Stairs, Amory ( ) []  - 0 Simple Wound Cleansing - one wound X- 6 5 Complex Wound Cleansing - multiple wounds X- 1 5 Wound Imaging (photographs - any  number of wounds) []  - 0 Wound Tracing (instead of photographs) []  - 0 Simple Wound Measurement - one wound X- 6 5 Complex Wound Measurement - multiple  wounds INTERVENTIONS - Wound Dressings []  - Small Wound Dressing one or multiple wounds 0 X- 2 15 Medium Wound Dressing one or multiple wounds []  - 0 Large Wound Dressing one or multiple wounds X- 1 5 Application of Medications - topical []  - 0 Application of Medications - injection INTERVENTIONS - Miscellaneous []  - External ear exam 0 []  - 0 Specimen Collection (cultures, biopsies, blood, body fluids, etc.) []  - 0 Specimen(s) / Culture(s) sent or taken to Lab for analysis []  - 0 Patient Transfer (multiple staff / Michiel Sites Lift / Similar devices) []  - 0 Simple Staple / Suture removal (25 or less) []  - 0 Complex Staple / Suture removal (26 or more) []  - 0 Hypo / Hyperglycemic Management (close monitor of Blood Glucose) []  - 0 Ankle / Brachial Index (ABI) - do not check if billed separately X- 1 5 Vital Signs Has the patient been seen at the hospital within the last three years: Yes Total Score: 175 Level Of Care: New/Established - Level 5 Electronic Signature(s) Signed: 12/03/2020 5:13:28 PM By: Yevonne Pax RN Entered By: Yevonne Pax on 12/03/2020 16:24:59 Breanna Bennett ([id number]) -------------------------------------------------------------------------------- Encounter Discharge Information Details Patient Name: Breanna Bennett Date of Service: 12/03/2020 3:30 PM Medical Record Number: [id number] Patient Account Number: [id number] Date of Birth/Sex: 06-Jun-1956 (64 y.o. F) Treating RN: Rogers Blocker Primary Care Arthelia Callicott: Blane Ohara Other Clinician: Lolita Cram Referring Traveon Louro: Blane Ohara Treating Ashantee Deupree/Extender: Rowan Blase in Treatment: 7 Encounter Discharge Information Items Discharge Condition: Stable Ambulatory Status: Wheelchair Discharge Destination: Home Transportation: Private  Auto Accompanied By: husband Schedule Follow-up Appointment: Yes Clinical Summary of Care: Electronic Signature(s) Signed: 12/03/2020 4:46:12 PM By: Phillis Haggis, Dondra Prader RN Entered By: Phillis Haggis, Dondra Prader on 12/03/2020 16:42:04 Breanna Bennett ([id number]) -------------------------------------------------------------------------------- Lower Extremity Assessment Details Patient Name: Breanna Bennett Date of Service: 12/03/2020 3:30 PM Medical Record Number: [id number] Patient Account Number: [id number] Date of Birth/Sex: June 04, 1956 (64 y.o. F) Treating RN: Hansel Feinstein Primary Care Regina Coppolino: Blane Ohara Other Clinician: Lolita Cram Referring Blane Worthington: Blane Ohara Treating Tria Noguera/Extender: Allen Derry Weeks in Treatment: 7 Edema Assessment Assessed: [Left: Yes] [Right: Yes] Edema: [Left: Yes] [Right: Yes] Calf Left: Right: Point of Measurement: 40 cm From Medial Instep 38 cm 41 cm Ankle Left: Right: Point of Measurement: 10 cm From Medial Instep 22 cm 22 cm Vascular Assessment Pulses: Dorsalis Pedis Palpable: [Left:Yes] [Right:Yes] Electronic Signature(s) Signed: 12/03/2020 4:36:12 PM By: Hansel Feinstein Entered By: Hansel Feinstein on 12/03/2020 15:52:36 Breanna Bennett, Breanna Bennett ([id number]) -------------------------------------------------------------------------------- Multi Wound Chart Details Patient Name: Breanna Bennett Date of Service: 12/03/2020 3:30 PM Medical Record Number: [id number] Patient Account Number: [id number] Date of Birth/Sex: 11/21/1955 (64 y.o. F) Treating RN: Yevonne Pax Primary Care Cephus Tupy: Blane Ohara Other Clinician: Lolita Cram Referring Dayelin Balducci: Blane Ohara Treating Daquana Paddock/Extender: Rowan Blase in Treatment: 7 Vital Signs Height(in): 68 Pulse(bpm): 82 Weight(lbs): 160 Blood Pressure(mmHg): 83/59 Body Mass Index(BMI): 24 Temperature(F): 98.1 Respiratory Rate(breaths/min): 16 Photos: Wound Location: Right Lower Leg  Right, Distal Lower Leg Left Lower Leg Wounding Event: Gradually Appeared Gradually Appeared Gradually Appeared 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 Extremity Extremity Comorbid History: Congestive Heart Failure, Congestive Heart Failure, Congestive Heart Failure, Hypertension, Type II Diabetes Hypertension, Type II Diabetes Hypertension, Type II Diabetes Date Acquired: 08/26/2019 08/26/2019 08/26/2019 Weeks of Treatment: 7 7 7  Wound Status: Open Open Open Measurements L x W x D (cm) 2x2.3x0.2 1x0.4x0.1 4.5x7.8x0.2 Area (cm) : 3.613  0.314 27.567 Volume (cm) : 0.723 0.031 5.513 % Reduction in Area: 48.80% 94.50% -265.60% % Reduction in Volume: 82.90% 98.60% -143.70% Classification: Grade 2 Grade 2 Grade 2 Exudate Amount: Large Large Large Exudate Type: Serous Serous Serous Exudate Color: amber amber amber Wound Margin: N/A N/A N/A Granulation Amount: Small (1-33%) Small (1-33%) Medium (34-66%) Granulation Quality: Red, Pink Red, Pink Pink, Pale Necrotic Amount: Large (67-100%) Large (67-100%) Medium (34-66%) Necrotic Tissue: N/A Eschar Adherent Slough Exposed Structures: Fat Layer (Subcutaneous Tissue): Fat Layer (Subcutaneous Tissue): Fat Layer (Subcutaneous Tissue): Yes Yes Yes Fascia: No Fascia: No Fascia: No Tendon: No Tendon: No Tendon: No Muscle: No Muscle: No Muscle: No Joint: No Joint: No Joint: No Bone: No Bone: No Bone: No Epithelialization: None None None Wound Number: 5 6 7  Photos: No Photos No Photos Breanna Bennett, Breanna Bennett ( ) Wound Location: Right, Medial Lower Leg Left, Medial Lower Leg Left, Posterior Lower Leg Wounding Event: Gradually Appeared Gradually Appeared Gradually Appeared 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 Extremity Extremity Comorbid History: Congestive  Heart Failure, Congestive Heart Failure, N/A Hypertension, Type II Diabetes Hypertension, Type II Diabetes Date Acquired: 02/07/2020 02/07/2020 02/07/2020 Weeks of Treatment: 7 7 7  Wound Status: Open Open Open Measurements L x W x D (cm) 6.2x5x0.2 0.5x0.5x0.1 0.5x0.5x0.1 Area (cm) : 24.347 0.196 0.196 Volume (cm) : 4.869 0.02 0.02 % Reduction in Area: -61.00% 0.00% 77.30% % Reduction in Volume: -61.00% 66.10% 92.30% Classification: Grade 2 Grade 2 Grade 2 Exudate Amount: Large Medium N/A Exudate Type: Serous Serous N/A Exudate Color: amber amber N/A Wound Margin: Flat and Intact N/A N/A Granulation Amount: Medium (34-66%) Medium (34-66%) N/A Granulation Quality: Red Red, Pink N/A Necrotic Amount: Medium (34-66%) Medium (34-66%) N/A Necrotic Tissue: Adherent Slough Adherent Slough N/A Exposed Structures: Fat Layer (Subcutaneous Tissue): Fat Layer (Subcutaneous Tissue): N/A Yes Yes Fascia: No Fascia: No Tendon: No Tendon: No Muscle: No Muscle: No Joint: No Joint: No Bone: No Bone: No Epithelialization: Small (1-33%) None N/A Treatment Notes Electronic Signature(s) Signed: 12/03/2020 5:13:28 PM By: RN Entered By: 02/02/2021 on 12/03/2020 16:13:26 Yevonne Pax (02/02/2021) -------------------------------------------------------------------------------- Multi-Disciplinary Care Plan Details Patient Name: Breanna Bennett Date of Service: 12/03/2020 3:30 PM Medical Record Number: Breanna Bennett Patient Account Number: 02/02/2021 Date of Birth/Sex: 1955-10-22 (64 y.o. F) Treating RN: 02/11/1956 Primary Care Cherokee Clowers: 11-18-1990 Other Clinician: Yevonne Pax Referring Tatyana Biber: Blane Ohara Treating Luverna Degenhart/Extender: Lolita Cram in Treatment: 7 Active Inactive Electronic Signature(s) Signed: 12/03/2020 5:13:28 PM By: Rowan Blase RN Entered By: 02/02/2021 on 12/03/2020 16:13:09 Yevonne Pax  (02/02/2021) -------------------------------------------------------------------------------- Pain Assessment Details Patient Name: Breanna Bennett Date of Service: 12/03/2020 3:30 PM Medical Record Number: Breanna Bennett Patient Account Number: 02/02/2021 Date of Birth/Sex: 04-20-1956 (64 y.o. F) Treating RN: 02/11/1956 Primary Care Juda Lajeunesse: 11-18-1990 Other Clinician: Hansel Feinstein Referring Estaban Main[place]: Blane Ohara Treating Arshad Oberholzer/Extender: Lolita Cram in Treatment: 7 Active Problems Location of Pain Severity and Description of Pain Patient Has Paino Yes Site Locations Rate the pain. Current Pain Level: 8 Pain Management and Medication Current Pain Management: Electronic Signature(s) Signed: 12/03/2020 4:36:12 PM By: Rowan Blase Entered By: 02/02/2021 on 12/03/2020 15:33:09 Hansel Feinstein (02/02/2021) -------------------------------------------------------------------------------- Patient/Caregiver Education Details Patient Name: Breanna Bennett Date of Service: 12/03/2020 3:30 PM Medical Record Number: Breanna Bennett Patient Account Number: 02/02/2021 Date of Birth/Gender: April 03, 1956 (64 y.o. F) Treating RN: 02/11/1956 Primary Care Physician: 11-18-1990 Other Clinician: Yevonne Pax Referring  Physician: Blane Ohara Treating Physician/Extender: Rowan Blase in Treatment: 7 Education Assessment Education Provided To: Patient Education Topics Provided Wound/Skin Impairment: Methods: Explain/Verbal Responses: State content correctly Electronic Signature(s) Signed: 12/03/2020 5:13:28 PM By: Yevonne Pax RN Entered By: Yevonne Pax on 12/03/2020 16:25:17 Breanna Bennett ([id number]) -------------------------------------------------------------------------------- Wound Assessment Details Patient Name: Breanna Bennett Date of Service: 12/03/2020 3:30 PM Medical Record Number: [id number] Patient Account Number: [id number] Date of Birth/Sex: [birth year] (64  y.o. F) Treating RN: Hansel Feinstein Primary Care Alick Lecomte: Blane Ohara Other Clinician: Lolita Cram Referring Allegra Cerniglia: Blane Ohara Treating Tyhesha Dutson/Extender: Rowan Blase in Treatment: 7 Wound Status Wound Number: 2 Primary Etiology: Diabetic Wound/Ulcer of the Lower Extremity Wound Location: Right Lower Leg Wound Status: Open Wounding Event: Gradually Appeared Comorbid Congestive Heart Failure, Hypertension, Type II History: Diabetes Date Acquired: 08/26/2019 Weeks Of Treatment: 7 Clustered Wound: No Photos Wound Measurements Length: (cm) 2 % Redu Width: (cm) 2.3 % Redu Depth: (cm) 0.2 Epithe Area: (cm) 3.613 Volume: (cm) 0.723 ction in Area: 48.8% ction in Volume: 82.9% lialization: None Wound Description Classification: Grade 2 Foul O Exudate Amount: Large Slough Exudate Type: Serous Exudate Color: amber dor After Cleansing: No /Fibrino Yes Wound Bed Granulation Amount: Small (1-33%) Exposed Structure Granulation Quality: Red, Pink Fascia Exposed: No Necrotic Amount: Large (67-100%) Fat Layer (Subcutaneous Tissue) Exposed: Yes Tendon Exposed: No Muscle Exposed: No Joint Exposed: No Bone Exposed: No Treatment Notes Wound #2 (Lower Leg) Wound Laterality: Right Cleanser Peri-Wound Care Topical Primary Dressing Bennett, Breanna ([id number]) Silvercel Small 2x2 (in/in) Discharge Instruction: Apply Silvercel Small 2x2 (in/in) as instructed Secondary Dressing Kerlix 4.5 x 4.1 (in/yd) Discharge Instruction: Apply Kerlix 4.5 x 4.1 (in/yd) as instructed Xtrasorb Medium 4x5 (in/in) Discharge Instruction: Apply to wound as directed. Do not cut. Secured With 64M Medipore H Soft Cloth Surgical Tape, 2x2 (in/yd) Coban Cohesive Bandage 4x5 (yds) Stretched Discharge Instruction: Apply Coban as directed. Compression Wrap Compression Stockings Add-Ons Electronic Signature(s) Signed: 12/03/2020 4:36:12 PM By: Hansel Feinstein Entered By: Hansel Feinstein on 12/03/2020  15:49:07 Breanna Bennett ([id number]) -------------------------------------------------------------------------------- Wound Assessment Details Patient Name: Breanna Bennett Date of Service: 12/03/2020 3:30 PM Medical Record Number: [id number] Patient Account Number: [id number] Date of Birth/Sex: 10-15-1955 (64 y.o. F) Treating RN: Hansel Feinstein Primary Care Lanetta Figuero: Blane Ohara Other Clinician: Lolita Cram Referring Karlo Goeden: Blane Ohara Treating Meline Russaw/Extender: Rowan Blase in Treatment: 7 Wound Status Wound Number: 3 Primary Etiology: Diabetic Wound/Ulcer of the Lower Extremity Wound Location: Right, Distal Lower Leg Wound Status: Open Wounding Event: Gradually Appeared Comorbid Congestive Heart Failure, Hypertension, Type II History: Diabetes Date Acquired: 08/26/2019 Weeks Of Treatment: 7 Clustered Wound: No Photos Wound Measurements Length: (cm) 1 % Re Width: (cm) 0.4 % Re Depth: (cm) 0.1 Epit Area: (cm) 0.314 Tun Volume: (cm) 0.031 Und duction in Area: 94.5% duction in Volume: 98.6% helialization: None neling: No ermining: No Wound Description Classification: Grade 2 Foul Exudate Amount: Large Slou Exudate Type: Serous Exudate Color: amber Odor After Cleansing: No gh/Fibrino Yes Wound Bed Granulation Amount: Small (1-33%) Exposed Structure Granulation Quality: Red, Pink Fascia Exposed: No Necrotic Amount: Large (67-100%) Fat Layer (Subcutaneous Tissue) Exposed: Yes Necrotic Quality: Eschar Tendon Exposed: No Muscle Exposed: No Joint Exposed: No Bone Exposed: No Treatment Notes Wound #3 (Lower Leg) Wound Laterality: Right, Distal Cleanser Peri-Wound Care Topical Primary Dressing Bennett, Breanna ([id number]) Silvercel Small 2x2 (in/in) Discharge Instruction: Apply Silvercel Small 2x2 (in/in) as instructed Secondary Dressing Kerlix 4.5 x 4.1 (in/yd) Discharge Instruction: Apply from just behind  toes to patella knotch lightly Xtrasorb  Medium 4x5 (in/in) Discharge Instruction: Apply to wound as directed. Do not cut. Secured With 8M Medipore H Soft Cloth Surgical Tape, 2x2 (in/yd) Coban Cohesive Bandage 4x5 (yds) Stretched Discharge Instruction: Apply from just behind toes to patella knotch lightly Compression Wrap Compression Stockings Add-Ons Electronic Signature(s) Signed: 12/03/2020 4:36:12 PM By: Hansel Feinstein Entered By: Hansel Feinstein on 12/03/2020 15:49:08 Breanna Bennett ([id number]) -------------------------------------------------------------------------------- Wound Assessment Details Patient Name: Breanna Bennett Date of Service: 12/03/2020 3:30 PM Medical Record Number: [id number] Patient Account Number: [id number] Date of Birth/Sex: 1956/07/22 (64 y.o. F) Treating RN: Hansel Feinstein Primary Care Raniya Golembeski: Blane Ohara Other Clinician: Lolita Cram Referring Maleigha Colvard: Blane Ohara Treating Oscar Hank/Extender: Rowan Blase in Treatment: 7 Wound Status Wound Number: 4 Primary Etiology: Diabetic Wound/Ulcer of the Lower Extremity Wound Location: Left Lower Leg Wound Status: Open Wounding Event: Gradually Appeared Comorbid Congestive Heart Failure, Hypertension, Type II History: Diabetes Date Acquired: 08/26/2019 Weeks Of Treatment: 7 Clustered Wound: No Photos Wound Measurements Length: (cm) 4.5 % Redu Width: (cm) 7.8 % Redu Depth: (cm) 0.2 Epithe Area: (cm) 27.567 Tunne Volume: (cm) 5.513 Under ction in Area: -265.6% ction in Volume: -143.7% lialization: None ling: No mining: No Wound Description Classification: Grade 2 Foul O Exudate Amount: Large Slough Exudate Type: Serous Exudate Color: amber dor After Cleansing: No /Fibrino Yes Wound Bed Granulation Amount: Medium (34-66%) Exposed Structure Granulation Quality: Pink, Pale Fascia Exposed: No Necrotic Amount: Medium (34-66%) Fat Layer (Subcutaneous Tissue) Exposed: Yes Necrotic Quality: Adherent Slough Tendon Exposed:  No Muscle Exposed: No Joint Exposed: No Bone Exposed: No Treatment Notes Wound #4 (Lower Leg) Wound Laterality: Left Cleanser Peri-Wound Care Topical Primary Dressing Bennett, Breanna ([id number]) Silvercel Small 2x2 (in/in) Discharge Instruction: Apply Silvercel Small 2x2 (in/in) as instructed Secondary Dressing Kerlix 4.5 x 4.1 (in/yd) Discharge Instruction: Apply from just behind toes to patella knotch lightly Xtrasorb Medium 4x5 (in/in) Discharge Instruction: Apply to wound as directed. Do not cut. Secured With 8M Medipore H Soft Cloth Surgical Tape, 2x2 (in/yd) Coban Cohesive Bandage 4x5 (yds) Stretched Discharge Instruction: Apply from just behind toes to patella knotch lightly Compression Wrap Compression Stockings Add-Ons Electronic Signature(s) Signed: 12/03/2020 4:36:12 PM By: Hansel Feinstein Entered By: Hansel Feinstein on 12/03/2020 15:49:46 Breanna Bennett ([id number]) -------------------------------------------------------------------------------- Wound Assessment Details Patient Name: Breanna Bennett Date of Service: 12/03/2020 3:30 PM Medical Record Number: [id number] Patient Account Number: [id number] Date of Birth/Sex: 09-02-1955 (64 y.o. F) Treating RN: Hansel Feinstein Primary Care Sadie Hazelett: Blane Ohara Other Clinician: Lolita Cram Referring Jesiel Garate: Blane Ohara Treating Bennett Liburd/Extender: Rowan Blase in Treatment: 7 Wound Status Wound Number: 5 Primary Etiology: Diabetic Wound/Ulcer of the Lower Extremity Wound Location: Right, Medial Lower Leg Wound Status: Open Wounding Event: Gradually Appeared Comorbid Congestive Heart Failure, Hypertension, Type II History: Diabetes Date Acquired: 02/07/2020 Weeks Of Treatment: 7 Clustered Wound: No Photos Wound Measurements Length: (cm) 6.2 % Red Width: (cm) 5 % Red Depth: (cm) 0.2 Epith Area: (cm) 24.347 Tunn Volume: (cm) 4.869 Unde uction in Area: -61% uction in Volume: -61% elialization: Small  (1-33%) eling: No rmining: No Wound Description Classification: Grade 2 Foul Wound Margin: Flat and Intact Sloug Exudate Amount: Large Exudate Type: Serous Exudate Color: amber Odor After Cleansing: No h/Fibrino Yes Wound Bed Granulation Amount: Medium (34-66%) Exposed Structure Granulation Quality: Red Fascia Exposed: No Necrotic Amount: Medium (34-66%) Fat Layer (Subcutaneous Tissue) Exposed: Yes Necrotic Quality: Adherent Slough Tendon Exposed: No Muscle Exposed: No Joint Exposed: No Bone Exposed: No  Treatment Notes Wound #5 (Lower Leg) Wound Laterality: Right, Medial Cleanser Peri-Wound Care Topical Bennett, Breanna ([id number]) Primary Dressing Silvercel Small 2x2 (in/in) Discharge Instruction: Apply Silvercel Small 2x2 (in/in) as instructed Secondary Dressing Kerlix 4.5 x 4.1 (in/yd) Discharge Instruction: Apply from just behind toes to patella knotch lightly Xtrasorb Medium 4x5 (in/in) Discharge Instruction: Apply to wound as directed. Do not cut. Secured With 102M Medipore H Soft Cloth Surgical Tape, 2x2 (in/yd) Coban Cohesive Bandage 4x5 (yds) Stretched Discharge Instruction: Apply from just behind toes to patella knotch lightly Compression Wrap Compression Stockings Add-Ons Electronic Signature(s) Signed: 12/03/2020 4:36:12 PM By: Hansel FeinsteinBishop, Joy Entered By: Hansel FeinsteinBishop, Joy on 12/03/2020 15:46:12 Breanna Bennett, Breanna Bennett ([id number]) -------------------------------------------------------------------------------- Wound Assessment Details Patient Name: Breanna Bennett, Breanna Bennett Date of Service: 12/03/2020 3:30 PM Medical Record Number: [id number] Patient Account Number: [id number] Date of Birth/Sex: May 19, 1956 (64 y.o. F) Treating RN: Hansel FeinsteinBishop, Joy Primary Care Javontae Marlette: Blane Oharaox , Kirsten Other Clinician: Lolita CramBurnette, Kyara Referring Viki Carrera: Blane Oharaox , Kirsten Treating Kianah Harries/Extender: Rowan BlaseStone, Hoyt Weeks in Treatment: 7 Wound Status Wound Number: 6 Primary Etiology: Diabetic Wound/Ulcer of  the Lower Extremity Wound Location: Left, Medial Lower Leg Wound Status: Open Wounding Event: Gradually Appeared Comorbid Congestive Heart Failure, Hypertension, Type II History: Diabetes Date Acquired: 02/07/2020 Weeks Of Treatment: 7 Clustered Wound: No Wound Measurements Length: (cm) 0.5 Width: (cm) 0.5 Depth: (cm) 0.1 Area: (cm) 0.196 Volume: (cm) 0.02 % Reduction in Area: 0% % Reduction in Volume: 66.1% Epithelialization: None Tunneling: No Undermining: No Wound Description Classification: Grade 2 Exudate Amount: Medium Exudate Type: Serous Exudate Color: amber Foul Odor After Cleansing: No Slough/Fibrino Yes Wound Bed Granulation Amount: Medium (34-66%) Exposed Structure Granulation Quality: Red, Pink Fascia Exposed: No Necrotic Amount: Medium (34-66%) Fat Layer (Subcutaneous Tissue) Exposed: Yes Necrotic Quality: Adherent Slough Tendon Exposed: No Muscle Exposed: No Joint Exposed: No Bone Exposed: No Treatment Notes Wound #6 (Lower Leg) Wound Laterality: Left, Medial Cleanser Peri-Wound Care Topical Primary Dressing Silvercel Small 2x2 (in/in) Discharge Instruction: Apply Silvercel Small 2x2 (in/in) as instructed Secondary Dressing Kerlix 4.5 x 4.1 (in/yd) Discharge Instruction: Apply from just behind toes to patella knotch lightly Xtrasorb Medium 4x5 (in/in) Discharge Instruction: Apply to wound as directed. Do not cut. Secured With 102M Medipore H Soft Cloth Surgical Tape, 2x2 (in/yd) Coban Cohesive Bandage 4x5 (yds) Stretched Discharge Instruction: Apply from just behind toes to patella knotch lightly Fronczak, Isabelle ([id number]) Compression Wrap Compression Stockings Add-Ons Electronic Signature(s) Signed: 12/03/2020 4:36:12 PM By: Hansel FeinsteinBishop, Joy Entered By: Hansel FeinsteinBishop, Joy on 12/03/2020 15:50:30 Breanna Bennett, Shaquita ([id number]) -------------------------------------------------------------------------------- Wound Assessment Details Patient Name: Breanna Bennett,  Bedelia Date of Service: 12/03/2020 3:30 PM Medical Record Number: [id number] Patient Account Number: [id number] Date of Birth/Sex: May 19, 1956 (64 y.o. F) Treating RN: Hansel FeinsteinBishop, Joy Primary Care Elmon Shader: Blane Oharaox , Kirsten Other Clinician: Lolita CramBurnette, Kyara Referring Albertina Leise: Blane Oharaox , Kirsten Treating Smrithi Pigford/Extender: Rowan BlaseStone, Hoyt Weeks in Treatment: 7 Wound Status Wound Number: 7 Primary Etiology: Diabetic Wound/Ulcer of the Lower Extremity Wound Location: Left, Posterior Lower Leg Wound Status: Open Wounding Event: Gradually Appeared Date Acquired: 02/07/2020 Weeks Of Treatment: 7 Clustered Wound: No Wound Measurements Length: (cm) 0.5 Width: (cm) 0.5 Depth: (cm) 0.1 Area: (cm) 0.196 Volume: (cm) 0.02 % Reduction in Area: 77.3% % Reduction in Volume: 92.3% Wound Description Classification: Grade 2 Treatment Notes Wound #7 (Lower Leg) Wound Laterality: Left, Posterior Cleanser Peri-Wound Care Topical Primary Dressing Silvercel Small 2x2 (in/in) Discharge Instruction: Apply Silvercel Small 2x2 (in/in) as instructed Secondary Dressing Kerlix 4.5 x 4.1 (in/yd) Discharge Instruction: Apply from just behind toes to patella knotch  lightly Xtrasorb Medium 4x5 (in/in) Discharge Instruction: Apply to wound as directed. Do not cut. Secured With 27M Medipore H Soft Cloth Surgical Tape, 2x2 (in/yd) Coban Cohesive Bandage 4x5 (yds) Stretched Discharge Instruction: Apply from just behind toes to patella knotch lightly Compression Wrap Compression Stockings Add-Ons Electronic Signature(s) Signed: 12/03/2020 4:36:12 PM By: Hansel Feinstein Entered By: Hansel Feinstein on 12/03/2020 15:51:03 Breanna Bennett ([id number]) -------------------------------------------------------------------------------- Vitals Details Patient Name: Breanna Bennett Date of Service: 12/03/2020 3:30 PM Medical Record Number: [id number] Patient Account Number: [id number] Date of Birth/Sex: 1955-10-30 (65 y.o. F) Treating  RN: Hansel Feinstein Primary Care Kanyon Bunn: Blane Ohara Other Clinician: Lolita Cram Referring Trichelle Lehan: Blane Ohara Treating Lakeshia Dohner/Extender: Rowan Blase in Treatment: 7 Vital Signs Time Taken: 15:31 Temperature (F): 98.1 Height (in): 68 Pulse (bpm): 82 Weight (lbs): 160 Respiratory Rate (breaths/min): 16 Body Mass Index (BMI): 24.3 Blood Pressure (mmHg): 83/59 Reference Range: 80 - 120 mg / dl Electronic Signature(s) Signed: 12/03/2020 4:36:12 PM By: Hansel Feinstein Entered ByHansel Feinstein on 12/03/2020 15:33:00

## 2020-12-05 ENCOUNTER — Telehealth: Payer: Self-pay | Admitting: Cardiology

## 2020-12-05 NOTE — Telephone Encounter (Signed)
Patient is returning call to discuss lab results. 

## 2020-12-05 NOTE — Telephone Encounter (Signed)
Left vm for pt to callback 

## 2020-12-06 ENCOUNTER — Other Ambulatory Visit: Payer: Self-pay

## 2020-12-06 ENCOUNTER — Telehealth: Payer: Self-pay | Admitting: Cardiology

## 2020-12-06 MED ORDER — LORATADINE 10 MG PO TABS: 10.0000 mg | ORAL_TABLET | Freq: Every day | ORAL | 0 refills | Status: DC | PRN

## 2020-12-06 NOTE — Telephone Encounter (Signed)
Spoke with patient regarding results and recommendation.  Patient verbalizes understanding and is agreeable to plan of care. Advised patient to call back with any issues or concerns.  

## 2020-12-06 NOTE — Telephone Encounter (Signed)
Follow Up:     Pt would like her lab results from 11-30-20.

## 2020-12-10 ENCOUNTER — Ambulatory Visit: Payer: 59 | Admitting: Physician Assistant

## 2020-12-11 ENCOUNTER — Encounter: Payer: 59 | Admitting: Physician Assistant

## 2020-12-11 ENCOUNTER — Other Ambulatory Visit
Admission: RE | Admit: 2020-12-11 | Discharge: 2020-12-11 | Disposition: A | Discharge status: Home / Self Care | Payer: 59 | Source: Ambulatory Visit | Attending: Physician Assistant | Admitting: Physician Assistant

## 2020-12-11 ENCOUNTER — Other Ambulatory Visit: Payer: Self-pay

## 2020-12-11 DIAGNOSIS — L089 Local infection of the skin and subcutaneous tissue, unspecified: Secondary | ICD-10-CM | POA: Insufficient documentation

## 2020-12-11 DIAGNOSIS — E11622 Type 2 diabetes mellitus with other skin ulcer: Secondary | ICD-10-CM | POA: Diagnosis not present

## 2020-12-11 NOTE — Progress Notes (Signed)
Breanna, Bennett ([id number]) Visit Report for 12/11/2020 Arrival Information Details Patient Name: Breanna, Bennett Date of Service: 12/11/2020 1:45 PM Medical Record Number: [id number] Patient Account Number: [id number] Date of Birth/Sex: November 22, 1955 (65 y.o. F) Treating RN: Hansel Feinstein Primary Care Julianne Chamberlin: Blane Ohara Other Clinician: Lolita Cram Referring Hadyn Azer: Blane Ohara Treating Linday Rhodes/Extender: Rowan Blase in Treatment: 8 Visit Information History Since Last Visit Added or deleted any medications: No Patient Arrived: Wheel Chair Had a fall or experienced change in No Arrival Time: 13:46 activities of daily living that may affect Accompanied By: daughter risk of falls: Transfer Assistance: EasyPivot Patient Hospitalized since last visit: No Lift Has Dressing in Place as Prescribed: Yes Patient Identification Verified: Yes Pain Present Now: Yes Secondary Verification Process Completed: Yes Patient Requires Transmission-Based No Precautions: Patient Has Alerts: No Electronic Signature(s) Signed: 12/11/2020 4:18:00 PM By: Hansel Feinstein Entered By: Hansel Feinstein on 12/11/2020 13:47:26 [name], [name] ([id number]) -------------------------------------------------------------------------------- Clinic Level of Care Assessment Details Patient Name: Breanna Bennett Date of Service: 12/11/2020 1:45 PM Medical Record Number: [id number] Patient Account Number: [id number] Date of Birth/Sex: 1956/05/11 (64 y.o. F) Treating RN: Rogers Blocker Primary Care Keyna Blizard: Blane Ohara Other Clinician: Lolita Cram Referring Ozzie Knobel: Blane Ohara Treating Larisha Vencill/Extender: Rowan Blase in Treatment: 8 Clinic Level of Care Assessment Items TOOL 4 Quantity Score X - Use when only an EandM is performed on FOLLOW-UP visit 1 0 ASSESSMENTS - Nursing Assessment / Reassessment X - Reassessment of Co-morbidities (includes updates in patient status) 1 10 X- 1  5 Reassessment of Adherence to Treatment Plan ASSESSMENTS - Wound and Skin Assessment / Reassessment []  - Simple Wound Assessment / Reassessment - one wound 0 X- 7 5 Complex Wound Assessment / Reassessment - multiple wounds []  - 0 Dermatologic / Skin Assessment (not related to wound area) ASSESSMENTS - Focused Assessment []  - Circumferential Edema Measurements - multi extremities 0 []  - 0 Nutritional Assessment / Counseling / Intervention []  - 0 Lower Extremity Assessment (monofilament, tuning fork, pulses) []  - 0 Peripheral Arterial Disease Assessment (using hand held doppler) ASSESSMENTS - Ostomy and/or Continence Assessment and Care []  - Incontinence Assessment and Management 0 []  - 0 Ostomy Care Assessment and Management (repouching, etc.) PROCESS - Coordination of Care X - Simple Patient / Family Education for ongoing care 1 15 []  - 0 Complex (extensive) Patient / Family Education for ongoing care []  - 0 Staff obtains Chiropractor, Records, Test Results / Process Orders []  - 0 Staff telephones HHA, Nursing Homes / Clarify orders / etc []  - 0 Routine Transfer to another Facility (non-emergent condition) []  - 0 Routine Hospital Admission (non-emergent condition) []  - 0 New Admissions / Manufacturing engineer / Ordering NPWT, Apligraf, etc. []  - 0 Emergency Hospital Admission (emergent condition) X- 1 10 Simple Discharge Coordination []  - 0 Complex (extensive) Discharge Coordination PROCESS - Special Needs []  - Pediatric / Minor Patient Management 0 []  - 0 Isolation Patient Management []  - 0 Hearing / Language / Visual special needs []  - 0 Assessment of Community assistance (transportation, D/C planning, etc.) []  - 0 Additional assistance / Altered mentation []  - 0 Support Surface(s) Assessment (bed, cushion, seat, etc.) INTERVENTIONS - Wound Cleansing / Measurement [name], [name] ([id number]) []  - 0 Simple Wound Cleansing - one wound X- 7 5 Complex Wound  Cleansing - multiple wounds X- 1 5 Wound Imaging (photographs - any number of wounds) []  - 0 Wound Tracing (instead of photographs) []  - 0 Simple Wound Measurement - one wound  X- 7 5 Complex Wound Measurement - multiple wounds INTERVENTIONS - Wound Dressings []  - Small Wound Dressing one or multiple wounds 0 X- 1 15 Medium Wound Dressing one or multiple wounds []  - 0 Large Wound Dressing one or multiple wounds []  - 0 Application of Medications - topical []  - 0 Application of Medications - injection INTERVENTIONS - Miscellaneous []  - External ear exam 0 []  - 0 Specimen Collection (cultures, biopsies, blood, body fluids, etc.) []  - 0 Specimen(s) / Culture(s) sent or taken to Lab for analysis []  - 0 Patient Transfer (multiple staff / Nurse, adult / Similar devices) []  - 0 Simple Staple / Suture removal (25 or less) []  - 0 Complex Staple / Suture removal (26 or more) []  - 0 Hypo / Hyperglycemic Management (close monitor of Blood Glucose) []  - 0 Ankle / Brachial Index (ABI) - do not check if billed separately X- 1 5 Vital Signs Has the patient been seen at the hospital within the last three years: Yes Total Score: 170 Level Of Care: New/Established - Level 5 Electronic Signature(s) Signed: 12/11/2020 5:05:23 PM By: Phillis Haggis, Dondra Prader RN Entered By: Phillis Haggis, Dondra Prader on 12/11/2020 15:08:59 Breanna Bennett ([id number]) -------------------------------------------------------------------------------- Encounter Discharge Information Details Patient Name: Breanna Bennett Date of Service: 12/11/2020 1:45 PM Medical Record Number: [id number] Patient Account Number: [id number] Date of Birth/Sex: Feb 01, 1956 (64 y.o. F) Treating RN: Rogers Blocker Primary Care Vielka Klinedinst: Blane Ohara Other Clinician: Lolita Cram Referring Hanni Milford: Blane Ohara Treating Emillee Talsma/Extender: Rowan Blase in Treatment: 8 Encounter Discharge Information Items Discharge Condition:  Stable Ambulatory Status: Wheelchair Discharge Destination: Home Transportation: Private Auto Accompanied By: daughter Schedule Follow-up Appointment: Yes Clinical Summary of Care: Electronic Signature(s) Signed: 12/11/2020 4:09:29 PM By: Lolita Cram Entered By: Lolita Cram on 12/11/2020 15:32:04 Breanna Bennett ([id number]) -------------------------------------------------------------------------------- Lower Extremity Assessment Details Patient Name: Breanna Bennett Date of Service: 12/11/2020 1:45 PM Medical Record Number: [id number] Patient Account Number: [id number] Date of Birth/Sex: 12-23-1955 (64 y.o. F) Treating RN: Hansel Feinstein Primary Care Kylani Wires: Blane Ohara Other Clinician: Lolita Cram Referring Robertlee Rogacki: Blane Ohara Treating Kamali Sakata/Extender: Allen Derry Weeks in Treatment: 8 Edema Assessment Assessed: [Left: Yes] [Right: Yes] Edema: [Left: Yes] [Right: Yes] Calf Left: Right: Point of Measurement: 40 cm From Medial Instep 38 cm 40 cm Ankle Left: Right: Point of Measurement: 10 cm From Medial Instep 22 cm 22 cm Vascular Assessment Pulses: Dorsalis Pedis Palpable: [Left:Yes] [Right:Yes] Electronic Signature(s) Signed: 12/11/2020 4:18:00 PM By: Hansel Feinstein Entered By: Hansel Feinstein on 12/11/2020 14:09:56 Breanna Bennett ([id number]) -------------------------------------------------------------------------------- Multi Wound Chart Details Patient Name: Breanna Bennett Date of Service: 12/11/2020 1:45 PM Medical Record Number: [id number] Patient Account Number: [id number] Date of Birth/Sex: 01/08/1956 (64 y.o. F) Treating RN: Rogers Blocker Primary Care Miray Mancino: Blane Ohara Other Clinician: Lolita Cram Referring Zaydenn Balaguer: Blane Ohara Treating Everlyn Farabaugh/Extender: Rowan Blase in Treatment: 8 Vital Signs Height(in): 68 Pulse(bpm): 86 Weight(lbs): 160 Blood Pressure(mmHg): 92/69 Body Mass Index(BMI): 24 Temperature(F):  97.6 Respiratory Rate(breaths/min): 18 Photos: Wound Location: Right Lower Leg Right, Distal Lower Leg Left Lower Leg Wounding Event: Gradually Appeared Gradually Appeared Gradually Appeared 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 Extremity Extremity Comorbid History: Congestive Heart Failure, Congestive Heart Failure, Congestive Heart Failure, Hypertension, Type II Diabetes Hypertension, Type II Diabetes Hypertension, Type II Diabetes Date Acquired: 08/26/2019 08/26/2019 08/26/2019 Weeks of Treatment: 8 8 8  Wound Status: Open Open Open Measurements L x W x D (cm)  1.8x2x0.2 0.6x0.7x0.1 4.8x6.8x0.2 Area (cm) : 2.827 0.33 25.635 Volume (cm) : 0.565 0.033 5.127 % Reduction in Area: 60.00% 94.20% -240.00% % Reduction in Volume: 86.70% 98.60% -126.70% Classification: Grade 2 Grade 2 Grade 2 Exudate Amount: Medium Medium Large Exudate Type: Serosanguineous Serosanguineous Serous Exudate Color: red, brown red, brown amber Wound Margin: N/A N/A N/A Granulation Amount: Medium (34-66%) Medium (34-66%) Medium (34-66%) Granulation Quality: Red, Pink Red, Pink Pink, Pale Necrotic Amount: Medium (34-66%) Medium (34-66%) Medium (34-66%) Exposed Structures: Fat Layer (Subcutaneous Tissue): Fat Layer (Subcutaneous Tissue): Fat Layer (Subcutaneous Tissue): Yes Yes Yes Fascia: No Fascia: No Fascia: No Tendon: No Tendon: No Tendon: No Muscle: No Muscle: No Muscle: No Joint: No Joint: No Joint: No Bone: No Bone: No Bone: No Epithelialization: Small (1-33%) None Small (1-33%) Wound Number: 5 6 7  Photos: No Photos Jewel, Mattia ( ) Wound Location: Right, Medial Lower Leg Left, Medial Lower Leg Left, Posterior Lower Leg Wounding Event: Gradually Appeared Gradually Appeared Gradually Appeared Primary Etiology: Diabetic Wound/Ulcer of the Lower Diabetic Wound/Ulcer of the Lower Diabetic  Wound/Ulcer of the Lower Extremity Extremity Extremity Comorbid History: Congestive Heart Failure, Congestive Heart Failure, Congestive Heart Failure, Hypertension, Type II Diabetes Hypertension, Type II Diabetes Hypertension, Type II Diabetes Date Acquired: 02/07/2020 02/07/2020 02/07/2020 Weeks of Treatment: 8 8 8  Wound Status: Open Open Open Measurements L x W x D (cm) 6x4.1x0.2 0.6x0.8x0.1 0.1x0.1x0.1 Area (cm) : 19.321 0.377 0.008 Volume (cm) : 3.864 0.038 0.001 % Reduction in Area: -27.80% -92.30% 99.10% % Reduction in Volume: -27.80% 35.60% 99.60% Classification: Grade 2 Grade 2 Grade 2 Exudate Amount: Large Medium None Present Exudate Type: Serous Serous N/A Exudate Color: amber amber N/A Wound Margin: Flat and Intact N/A N/A Granulation Amount: Medium (34-66%) Medium (34-66%) None Present (0%) Granulation Quality: Red Red, Pink N/A Necrotic Amount: Medium (34-66%) Medium (34-66%) None Present (0%) Exposed Structures: Fat Layer (Subcutaneous Tissue): Fat Layer (Subcutaneous Tissue): N/A Yes Yes Fascia: No Fascia: No Tendon: No Tendon: No Muscle: No Muscle: No Joint: No Joint: No Bone: No Bone: No Epithelialization: Small (1-33%) Small (1-33%) N/A Treatment Notes Electronic Signature(s) Signed: 12/11/2020 5:05:23 PM By: , 12/13/2020 RN Entered By: Phillis Haggis, Dondra Prader on 12/11/2020 14:55:00 Dondra Prader (12/13/2020) -------------------------------------------------------------------------------- Multi-Disciplinary Care Plan Details Patient Name: Breanna Bennett Date of Service: 12/11/2020 1:45 PM Medical Record Number: Breanna Bennett Patient Account Number: 12/13/2020 Date of Birth/Sex: 03-08-1956 (64 y.o. F) Treating RN: 02/11/1956 Primary Care Curtisha Bendix: 11[birth year]18[birth year]1990 Other Clinician: Rogers Blocker Referring Clotiel Troop: Blane Ohara Treating Millenia Waldvogel/Extender: Lolita Cram in Treatment: 8 Active Inactive Electronic Signature(s) Signed:  12/11/2020 5:05:23 PM By: Rowan Blase, 12/13/2020 RN Entered By: Phillis Haggis, Dondra Prader on 12/11/2020 14:54:42 Dondra Prader (12/13/2020) -------------------------------------------------------------------------------- Pain Assessment Details Patient Name: Breanna Bennett Date of Service: 12/11/2020 1:45 PM Medical Record Number: Breanna Bennett Patient Account Number: 12/13/2020 Date of Birth/Sex: [birth year] (64 y.o. F) Treating RN: 02/11/1956 Primary Care Oziel Beitler: 11-18[birth year]1990 Other Clinician: Hansel Feinstein Referring Kashara Blocher: Blane Ohara Treating Shironda Kain/Extender: Lolita Cram in Treatment: 8 Active Problems Location of Pain Severity and Description of Pain Patient Has Paino Yes Site Locations Pain Location: Pain in Ulcers Rate the pain. Current Pain Level: 8 Pain Management and Medication Current Pain Management: Electronic Signature(s) Signed: 12/11/2020 4:18:00 PM By: Rowan Blase Entered By: 12/13/2020 on 12/11/2020 13:52:14 Hansel Feinstein (12/13/2020) -------------------------------------------------------------------------------- Patient/Caregiver Education Details Patient Name: Breanna Bennett Date of Service: 12/11/2020 1:45 PM Medical Record Number: Breanna Bennett Patient Account Number: 12/13/2020 Date of Birth/Gender: September 25, 1955 (64  y.o. F) Treating RN: Rogers Blocker Primary Care Physician: Blane Ohara Other Clinician: Lolita Cram Referring Physician: Blane Ohara Treating Physician/Extender: Rowan Blase in Treatment: 8 Education Assessment Education Provided To: Patient Education Topics Provided Wound/Skin Impairment: Methods: Explain/Verbal Responses: State content correctly Electronic Signature(s) Signed: 12/11/2020 5:05:23 PM By: Phillis Haggis, Dondra Prader RN Entered By: Phillis Haggis, Dondra Prader on 12/11/2020 15:09:19 Breanna Bennett ([id number]) -------------------------------------------------------------------------------- Wound  Assessment Details Patient Name: Breanna Bennett Date of Service: 12/11/2020 1:45 PM Medical Record Number: [id number] Patient Account Number: [id number] Date of Birth/Sex: April 19, 1956 (64 y.o. F) Treating RN: Hansel Feinstein Primary Care Istvan Behar: Blane Ohara Other Clinician: Lolita Cram Referring Tandrea Kommer: Blane Ohara Treating Nahara Dona/Extender: Rowan Blase in Treatment: 8 Wound Status Wound Number: 2 Primary Etiology: Diabetic Wound/Ulcer of the Lower Extremity Wound Location: Right Lower Leg Wound Status: Open Wounding Event: Gradually Appeared Comorbid Congestive Heart Failure, Hypertension, Type II History: Diabetes Date Acquired: 08/26/2019 Weeks Of Treatment: 8 Clustered Wound: No Photos Wound Measurements Length: (cm) 1.8 % Reduct Width: (cm) 2 % Reduct Depth: (cm) 0.2 Epitheli Area: (cm) 2.827 Tunneli Volume: (cm) 0.565 Undermi ion in Area: 60% ion in Volume: 86.7% alization: Small (1-33%) ng: No ning: No Wound Description Classification: Grade 2 Foul Od Exudate Amount: Medium Slough/ Exudate Type: Serosanguineous Exudate Color: red, brown or After Cleansing: No Fibrino Yes Wound Bed Granulation Amount: Medium (34-66%) Exposed Structure Granulation Quality: Red, Pink Fascia Exposed: No Necrotic Amount: Medium (34-66%) Fat Layer (Subcutaneous Tissue) Exposed: Yes Necrotic Quality: Adherent Slough Tendon Exposed: No Muscle Exposed: No Joint Exposed: No Bone Exposed: No Treatment Notes Wound #2 (Lower Leg) Wound Laterality: Right Cleanser Peri-Wound Care Topical Primary Dressing Hulsey, Reighlyn ([id number]) Silvercel Small 2x2 (in/in) Discharge Instruction: Apply Silvercel Small 2x2 (in/in) as instructed Secondary Dressing ABD Pad 5x9 (in/in) Discharge Instruction: Cover with ABD pad Kerlix 4.5 x 4.1 (in/yd) Discharge Instruction: Apply Kerlix 4.5 x 4.1 (in/yd) as instructed Secured With 36M Medipore H Soft Cloth Surgical Tape, 2x2  (in/yd) Discharge Instruction: Secure kerlix Compression Wrap Jobst Farrow Wrap 4000 Discharge Instruction: Secure kerlix with compression wrap Compression Stockings Add-Ons Electronic Signature(s) Signed: 12/11/2020 4:18:00 PM By: Hansel Feinstein Entered By: Hansel Feinstein on 12/11/2020 14:05:27 Breanna Bennett ([id number]) -------------------------------------------------------------------------------- Wound Assessment Details Patient Name: Breanna Bennett Date of Service: 12/11/2020 1:45 PM Medical Record Number: [id number] Patient Account Number: [id number] Date of Birth/Sex: 18-May-1956 (64 y.o. F) Treating RN: Hansel Feinstein Primary Care Torey Regan: Blane Ohara Other Clinician: Lolita Cram Referring Dandra Velardi: Blane Ohara Treating Evalynne Locurto/Extender: Rowan Blase in Treatment: 8 Wound Status Wound Number: 3 Primary Etiology: Diabetic Wound/Ulcer of the Lower Extremity Wound Location: Right, Distal Lower Leg Wound Status: Open Wounding Event: Gradually Appeared Comorbid Congestive Heart Failure, Hypertension, Type II History: Diabetes Date Acquired: 08/26/2019 Weeks Of Treatment: 8 Clustered Wound: No Photos Wound Measurements Length: (cm) 0.6 % Redu Width: (cm) 0.7 % Redu Depth: (cm) 0.1 Epithe Area: (cm) 0.33 Tunne Volume: (cm) 0.033 Under ction in Area: 94.2% ction in Volume: 98.6% lialization: None ling: No mining: No Wound Description Classification: Grade 2 Foul Exudate Amount: Medium Sloug Exudate Type: Serosanguineous Exudate Color: red, brown Odor After Cleansing: No h/Fibrino Yes Wound Bed Granulation Amount: Medium (34-66%) Exposed Structure Granulation Quality: Red, Pink Fascia Exposed: No Necrotic Amount: Medium (34-66%) Fat Layer (Subcutaneous Tissue) Exposed: Yes Necrotic Quality: Adherent Slough Tendon Exposed: No Muscle Exposed: No Joint Exposed: No Bone Exposed: No Treatment Notes Wound #3 (Lower Leg) Wound Laterality: Right,  Distal Cleanser Peri-Wound  Care Topical Primary Dressing Ariel, Dally ([id number]) Silvercel Small 2x2 (in/in) Discharge Instruction: Apply Silvercel Small 2x2 (in/in) as instructed Secondary Dressing ABD Pad 5x9 (in/in) Discharge Instruction: Cover with ABD pad Kerlix 4.5 x 4.1 (in/yd) Discharge Instruction: Apply Kerlix 4.5 x 4.1 (in/yd) as instructed Secured With 69M Medipore H Soft Cloth Surgical Tape, 2x2 (in/yd) Discharge Instruction: Secure kerlix Compression Wrap Paulino DoorJobst Farrow Wrap 4000 Discharge Instruction: Secure kerlix with compression wrap Compression Stockings Add-Ons Electronic Signature(s) Signed: 12/11/2020 4:18:00 PM By: Hansel FeinsteinBishop, Joy Entered By: Hansel FeinsteinBishop, Joy on 12/11/2020 14:06:55 Breanna CruiseASSIDY, Donzella ([id number]) -------------------------------------------------------------------------------- Wound Assessment Details Patient Name: Breanna CruiseASSIDY, Austine Date of Service: 12/11/2020 1:45 PM Medical Record Number: [id number] Patient Account Number: [id number] Date of Birth/Sex: 12-Jan-1956 (64 y.o. F) Treating RN: Hansel FeinsteinBishop, Joy Primary Care Margorie Renner: Blane Oharaox , Kirsten Other Clinician: Lolita CramBurnette, Kyara Referring Gerlad Pelzel: Blane Oharaox , Kirsten Treating Mazzie Brodrick/Extender: Rowan BlaseStone, Hoyt Weeks in Treatment: 8 Wound Status Wound Number: 4 Primary Etiology: Diabetic Wound/Ulcer of the Lower Extremity Wound Location: Left Lower Leg Wound Status: Open Wounding Event: Gradually Appeared Comorbid Congestive Heart Failure, Hypertension, Type II History: Diabetes Date Acquired: 08/26/2019 Weeks Of Treatment: 8 Clustered Wound: No Photos Wound Measurements Length: (cm) 4.8 % Redu Width: (cm) 6.8 % Redu Depth: (cm) 0.2 Epithe Area: (cm) 25.635 Tunne Volume: (cm) 5.127 Under ction in Area: -240% ction in Volume: -126.7% lialization: Small (1-33%) ling: No mining: No Wound Description Classification: Grade 2 Foul O Exudate Amount: Large Slough Exudate Type: Serous Exudate Color:  amber dor After Cleansing: No /Fibrino Yes Wound Bed Granulation Amount: Medium (34-66%) Exposed Structure Granulation Quality: Pink, Pale Fascia Exposed: No Necrotic Amount: Medium (34-66%) Fat Layer (Subcutaneous Tissue) Exposed: Yes Necrotic Quality: Adherent Slough Tendon Exposed: No Muscle Exposed: No Joint Exposed: No Bone Exposed: No Treatment Notes Wound #4 (Lower Leg) Wound Laterality: Left Cleanser Peri-Wound Care Topical Primary Dressing Reineck, Talula ([id number]) Silvercel Small 2x2 (in/in) Discharge Instruction: Apply Silvercel Small 2x2 (in/in) as instructed Secondary Dressing ABD Pad 5x9 (in/in) Discharge Instruction: Cover with ABD pad Kerlix 4.5 x 4.1 (in/yd) Discharge Instruction: Apply Kerlix 4.5 x 4.1 (in/yd) as instructed Secured With 69M Medipore H Soft Cloth Surgical Tape, 2x2 (in/yd) Discharge Instruction: Secure kerlix Compression Wrap Jobst Farrow Wrap 4000 Discharge Instruction: Secure kerlix with compression wrap Compression Stockings Add-Ons Electronic Signature(s) Signed: 12/11/2020 4:18:00 PM By: Hansel FeinsteinBishop, Joy Entered By: Hansel FeinsteinBishop, Joy on 12/11/2020 13:59:53 Regis, Meriam SpragueBEVERLY ([id number]) -------------------------------------------------------------------------------- Wound Assessment Details Patient Name: Breanna CruiseASSIDY, Cathlene Date of Service: 12/11/2020 1:45 PM Medical Record Number: [id number] Patient Account Number: [id number] Date of Birth/Sex: 12-Jan-1956 (64 y.o. F) Treating RN: Hansel FeinsteinBishop, Joy Primary Care Shamyia Grandpre: Blane Oharaox , Kirsten Other Clinician: Lolita CramBurnette, Kyara Referring Leola Fiore: Blane Oharaox , Kirsten Treating Manila Rommel/Extender: Rowan BlaseStone, Hoyt Weeks in Treatment: 8 Wound Status Wound Number: 5 Primary Etiology: Diabetic Wound/Ulcer of the Lower Extremity Wound Location: Right, Medial Lower Leg Wound Status: Open Wounding Event: Gradually Appeared Comorbid Congestive Heart Failure, Hypertension, Type II History: Diabetes Date Acquired:  02/07/2020 Weeks Of Treatment: 8 Clustered Wound: No Photos Wound Measurements Length: (cm) 6 % Red Width: (cm) 4.1 % Red Depth: (cm) 0.2 Epith Area: (cm) 19.321 Volume: (cm) 3.864 uction in Area: -27.8% uction in Volume: -27.8% elialization: Small (1-33%) Wound Description Classification: Grade 2 Foul Wound Margin: Flat and Intact Sloug Exudate Amount: Large Exudate Type: Serous Exudate Color: amber Odor After Cleansing: No h/Fibrino Yes Wound Bed Granulation Amount: Medium (34-66%) Exposed Structure Granulation Quality: Red Fascia Exposed: No Necrotic Amount: Medium (34-66%) Fat Layer (Subcutaneous Tissue) Exposed: Yes Necrotic Quality: Adherent  Slough Tendon Exposed: No Muscle Exposed: No Joint Exposed: No Bone Exposed: No Treatment Notes Wound #5 (Lower Leg) Wound Laterality: Right, Medial Cleanser Peri-Wound Care Topical Alsop, Karen ([id number]) Primary Dressing Silvercel Small 2x2 (in/in) Discharge Instruction: Apply Silvercel Small 2x2 (in/in) as instructed Secondary Dressing ABD Pad 5x9 (in/in) Discharge Instruction: Cover with ABD pad Kerlix 4.5 x 4.1 (in/yd) Discharge Instruction: Apply Kerlix 4.5 x 4.1 (in/yd) as instructed Secured With 58M Medipore H Soft Cloth Surgical Tape, 2x2 (in/yd) Discharge Instruction: Secure kerlix Compression Wrap Paulino Door Wrap 4000 Discharge Instruction: Secure kerlix with compression wrap Compression Stockings Add-Ons Electronic Signature(s) Signed: 12/11/2020 4:18:00 PM By: Hansel Feinstein Entered By: Hansel Feinstein on 12/11/2020 14:03:36 Breanna Bennett ([id number]) -------------------------------------------------------------------------------- Wound Assessment Details Patient Name: Breanna Bennett Date of Service: 12/11/2020 1:45 PM Medical Record Number: [id number] Patient Account Number: [id number] Date of Birth/Sex: [birth year] (64 y.o. F) Treating RN: Hansel Feinstein Primary Care Welton Bord: Blane Ohara Other  Clinician: Lolita Cram Referring Ravyn Nikkel: Blane Ohara Treating Ashlin Kreps/Extender: Rowan Blase in Treatment: 8 Wound Status Wound Number: 6 Primary Etiology: Diabetic Wound/Ulcer of the Lower Extremity Wound Location: Left, Medial Lower Leg Wound Status: Open Wounding Event: Gradually Appeared Comorbid Congestive Heart Failure, Hypertension, Type II History: Diabetes Date Acquired: 02/07/2020 Weeks Of Treatment: 8 Clustered Wound: No Photos Wound Measurements Length: (cm) 0.6 % Red Width: (cm) 0.8 % Red Depth: (cm) 0.1 Epith Area: (cm) 0.377 Tunn Volume: (cm) 0.038 Unde uction in Area: -92.3% uction in Volume: 35.6% elialization: Small (1-33%) eling: No rmining: No Wound Description Classification: Grade 2 Foul Exudate Amount: Medium Sloug Exudate Type: Serous Exudate Color: amber Odor After Cleansing: No h/Fibrino Yes Wound Bed Granulation Amount: Medium (34-66%) Exposed Structure Granulation Quality: Red, Pink Fascia Exposed: No Necrotic Amount: Medium (34-66%) Fat Layer (Subcutaneous Tissue) Exposed: Yes Necrotic Quality: Adherent Slough Tendon Exposed: No Muscle Exposed: No Joint Exposed: No Bone Exposed: No Treatment Notes Wound #6 (Lower Leg) Wound Laterality: Left, Medial Cleanser Peri-Wound Care Topical Primary Dressing Kinner, Layah ([id number]) Silvercel Small 2x2 (in/in) Discharge Instruction: Apply Silvercel Small 2x2 (in/in) as instructed Secondary Dressing ABD Pad 5x9 (in/in) Discharge Instruction: Cover with ABD pad Kerlix 4.5 x 4.1 (in/yd) Discharge Instruction: Apply Kerlix 4.5 x 4.1 (in/yd) as instructed Secured With 58M Medipore H Soft Cloth Surgical Tape, 2x2 (in/yd) Discharge Instruction: Secure kerlix Compression Wrap Jobst Farrow Wrap 4000 Discharge Instruction: Secure kerlix with compression wrap Compression Stockings Add-Ons Electronic Signature(s) Signed: 12/11/2020 4:18:00 PM By: Hansel Feinstein Entered By:  Hansel Feinstein on 12/11/2020 14:01:03 Breanna Bennett ([id number]) -------------------------------------------------------------------------------- Wound Assessment Details Patient Name: Breanna Bennett Date of Service: 12/11/2020 1:45 PM Medical Record Number: [id number] Patient Account Number: [id number] Date of Birth/Sex: [birth year] (64 y.o. F) Treating RN: Hansel Feinstein Primary Care Jeannelle Wiens: Blane Ohara Other Clinician: Lolita Cram Referring Tanish Sinkler: Blane Ohara Treating Laquinn Shippy/Extender: Rowan Blase in Treatment: 8 Wound Status Wound Number: 7 Primary Etiology: Diabetic Wound/Ulcer of the Lower Extremity Wound Location: Left, Posterior Lower Leg Wound Status: Open Wounding Event: Gradually Appeared Comorbid Congestive Heart Failure, Hypertension, Type II History: Diabetes Date Acquired: 02/07/2020 Weeks Of Treatment: 8 Clustered Wound: No Wound Measurements Length: (cm) 0.1 Width: (cm) 0.1 Depth: (cm) 0.1 Area: (cm) 0.008 Volume: (cm) 0.001 % Reduction in Area: 99.1% % Reduction in Volume: 99.6% Wound Description Classification: Grade 2 Exudate Amount: None Present Foul Odor After Cleansing: No Slough/Fibrino No Wound Bed Granulation Amount: None Present (0%) Necrotic Amount: None Present (0%) Treatment Notes Wound #7 (Lower Leg) Wound  Laterality: Left, Posterior Cleanser Peri-Wound Care Topical Primary Dressing Silvercel Small 2x2 (in/in) Discharge Instruction: Apply Silvercel Small 2x2 (in/in) as instructed Secondary Dressing ABD Pad 5x9 (in/in) Discharge Instruction: Cover with ABD pad Kerlix 4.5 x 4.1 (in/yd) Discharge Instruction: Apply Kerlix 4.5 x 4.1 (in/yd) as instructed Secured With 55M Medipore H Soft Cloth Surgical Tape, 2x2 (in/yd) Discharge Instruction: Secure kerlix Compression Wrap Jobst Farrow Wrap 4000 Discharge Instruction: Secure kerlix with compression wrap Compression Stockings Add-Ons ROBENA, MALONEY  ([id number]) Electronic Signature(s) Signed: 12/11/2020 4:18:00 PM By: Hansel Feinstein Entered By: Hansel Feinstein on 12/11/2020 14:01:51 Breanna Bennett ([id number]) -------------------------------------------------------------------------------- Wound Assessment Details Patient Name: Breanna Bennett Date of Service: 12/11/2020 1:45 PM Medical Record Number: [id number] Patient Account Number: [id number] Date of Birth/Sex: Jan 04, 1956 (64 y.o. F) Treating RN: Rogers Blocker Primary Care Iliya Spivack: Blane Ohara Other Clinician: Lolita Cram Referring Alisia Vanengen: Blane Ohara Treating Kathe Wirick/Extender: Rowan Blase in Treatment: 8 Wound Status Wound Number: 8 Primary Etiology: Trauma, Other Wound Location: Medial Abdomen - Lower Quadrant Wound Status: Open Wounding Event: Gradually Appeared Comorbid Congestive Heart Failure, Hypertension, Type II History: Diabetes Date Acquired: 11/27/2020 Weeks Of Treatment: 0 Clustered Wound: No Photos Wound Measurements Length: (cm) 4 Width: (cm) 1.6 Depth: (cm) 0.6 Area: (cm) 5.027 Volume: (cm) 3.016 % Reduction in Area: % Reduction in Volume: Epithelialization: None Tunneling: No Undermining: No Wound Description Classification: Full Thickness Without Exposed Support Structures Exudate Amount: Medium Exudate Type: Purulent Exudate Color: yellow, brown, green Foul Odor After Cleansing: No Slough/Fibrino Yes Wound Bed Granulation Amount: Small (1-33%) Exposed Structure Granulation Quality: Red Fascia Exposed: No Necrotic Amount: Large (67-100%) Fat Layer (Subcutaneous Tissue) Exposed: Yes Necrotic Quality: Adherent Slough Tendon Exposed: No Muscle Exposed: No Joint Exposed: No Bone Exposed: No Treatment Notes Wound #8 (Abdomen - Lower Quadrant) Wound Laterality: Medial Cleanser Normal Saline Discharge Instruction: Wash your hands with soap and water. Remove old dressing, discard into plastic bag and place into trash. Cleanse  the wound with Normal Saline prior to applying a clean dressing using gauze sponges, not tissues or cotton balls. Do not scrub or use excessive force. Pat dry using gauze sponges, not tissue or cotton balls. AURELLIA, VITRANO ([id number]) Peri-Wound Care Topical Primary Dressing Hydrofera Blue Ready Transfer Foam, 4x5 (in/in) Discharge Instruction: Apply Hydrofera Blue Ready to wound bed as directed-cut to size of wound (extras sent with patient) Secondary Dressing Telfa Adhesive [place] Dressing, 4x4 (in/in) Discharge Instruction: Apply over dressing to secure in place. Secured With Compression Wrap Compression Stockings Add-Ons Electronic Signature(s) Signed: 12/11/2020 5:05:23 PM By: Phillis Haggis, Dondra Prader RN Entered By: Phillis Haggis, Dondra Prader on 12/11/2020 15:00:38 Breanna Bennett ([id number]) -------------------------------------------------------------------------------- Vitals Details Patient Name: Breanna Bennett Date of Service: 12/11/2020 1:45 PM Medical Record Number: [id number] Patient Account Number: [id number] Date of Birth/Sex: March 22, 1956 (64 y.o. F) Treating RN: Hansel Feinstein Primary Care Hadden Steig: Blane Ohara Other Clinician: Lolita Cram Referring Macaulay Reicher: Blane Ohara Treating Desmond Szabo/Extender: Rowan Blase in Treatment: 8 Vital Signs Time Taken: 13:46 Temperature (F): 97.6 Height (in): 68 Pulse (bpm): 86 Weight (lbs): 160 Respiratory Rate (breaths/min): 18 Body Mass Index (BMI): 24.3 Blood Pressure (mmHg): 92/69 Reference Range: 80 - 120 mg / dl Electronic Signature(s) Signed: 12/11/2020 4:18:00 PM By: Hansel Feinstein Entered ByHansel Feinstein on 12/11/2020 13:51:55

## 2020-12-11 NOTE — Progress Notes (Addendum)
[name], [name] ([id number]) Visit Report for 12/11/2020 Chief Complaint Document Details Patient Name: Breanna Bennett, Breanna Bennett Date of Service: 12/11/2020 1:45 PM Medical Record Number: [id number] Patient Account Number: [id number] Date of Birth/Sex: 03/28/1956 (65 y.o. F) Treating RN: Rogers Blocker Primary Care Provider: Blane Ohara Other Clinician: Lolita Cram Referring Provider: Blane Ohara Treating Provider/Extender: Rowan Blase in Treatment: 8 Information Obtained from: Patient Chief Complaint 10/10/2020; patient is here with wounds on her bilateral lower legs Electronic Signature(s) Signed: 12/11/2020 1:52:40 PM By: Lenda Kelp PA-C Entered By: Lenda Kelp on 12/11/2020 13:52:39 Breanna Bennett ([id number]) -------------------------------------------------------------------------------- HPI Details Patient Name: Breanna Bennett Date of Service: 12/11/2020 1:45 PM Medical Record Number: [id number] Patient Account Number: [id number] Date of Birth/Sex: 1956/02/20 (65 y.o. F) Treating RN: Rogers Blocker Primary Care Provider: Blane Ohara Other Clinician: Lolita Cram Referring Provider: Blane Ohara Treating Provider/Extender: Rowan Blase in Treatment: 8 History of Present Illness HPI Description: ADMISSION 10/10/2020 This is a pleasant woman from [facility]. She is here accompanied by her daughter for review of wounds on her bilateral lower legs. Seems like these are chronic and have been present for the most part for about a year. She had compression stockings at one point but I am not sure that she is actually been wearing them. She has about 4 open areas on the left and more substantially for open areas on the right. She is a diabetic but is not on any current treatment. Her most other substantive problem is a severe cardiomyopathy with an ejection fraction less than 20%. Her daughter tells me that she runs a generally low blood pressure  and she only takes 10 mg of Lasix with an adjustment if her blood pressure is over a certain value giving it additional 10 mg. It does not seem like she has gained weight recently she says he is at 160 pounds and for her this is actually a reduction. They have been using Medihoney and Neosporin to this area. No recent compression wrap use. There are 4 open areas on the left and 4 open areas on the right. ABIs in our clinic were 1.35 on the right and 1.22 on the left 11/26/2020 on evaluation today patient appears to be doing poorly in regard to her lower extremities. With that being said the biggest issue currently seems to be really that she is having issues with extreme edema of the lower extremities and Dr. Leanord Hawking was concerned about the fact that to be honest the patient really needs to be seen by cardiology to see if there is anything they can do to help diurese her more as he was concerned about doing too strong of a compression. For that reason we have been doing Kerlix and Coban only for now patient has not seen her cardiologist we have not seen her since February 16 of this year. Unfortunately her wounds appear to be a bit worse than they were previous. 12/03/20 upon evaluation today patient appears to be doing well with regard to her wounds. She has been tolerating the dressing changes without complication. Fortunately there does not appear to be any signs of infection. She still has a tremendous amount of edema but this is being managed by her cardiologist in fact he did put her on some medications yesterday. This included increasing her furosemide to 20 mg 2 times a day. He is also lowering her blood pressure medication to compensate. Obviously this is a very fine balance with this patient.  It was actually mentioned in the note that the patient may need to consider palliative care. Obviously were trying to avoid making anything worse while still healing her wounds. 12/11/2020 upon evaluation  today patient appears to be doing well with regard to her wounds over the lower extremities. Unfortunately she has a new wound in the abdominal area as well underneath the periumbilical region which unfortunately she did not tell anybody about into I walked into the room. With that being said I think that we need to manage this as well today. She does have again some good granulation epithelization in regard to leg ulcers I feel like they are doing better. Although I am not really sure exactly what she is doing with the dressing she seems to be putting a lot of Band-Aids on the area. That is not really the ideal thing. Electronic Signature(s) Signed: 12/11/2020 6:00:55 PM By: Lenda Kelp PA-C Entered By: Lenda Kelp on 12/11/2020 18:00:55 Breanna Bennett, Breanna Bennett ([id number]) -------------------------------------------------------------------------------- Physical Exam Details Patient Name: Breanna Bennett Date of Service: 12/11/2020 1:45 PM Medical Record Number: [id number] Patient Account Number: [id number] Date of Birth/Sex: [birth year] (65 y.o. F) Treating RN: Rogers Blocker Primary Care Provider: Blane Ohara Other Clinician: Lolita Cram Referring Provider: Blane Ohara Treating Provider/Extender: Rowan Blase in Treatment: 8 Constitutional Well-nourished and well-hydrated in no acute distress. Respiratory normal breathing without difficulty. Psychiatric this patient is able to make decisions and demonstrates good insight into disease process. Alert and Oriented x 3. pleasant and cooperative. Notes Upon inspection patient has good granulation epithelization pretty much over the lower extremities I feel like she is making progress here and things do appear to be better. With regard to the abdominal area there was some necrotic debris I cleaned this out with a Q-tip currently in saline and gauze to mechanically debride some of this no sharp debridement performed as she was  having a lot of discomfort. Electronic Signature(s) Signed: 12/11/2020 6:01:16 PM By: Lenda Kelp PA-C Entered By: Lenda Kelp on 12/11/2020 18:01:16 Breanna Bennett ([id number]) -------------------------------------------------------------------------------- Physician Orders Details Patient Name: Breanna Bennett Date of Service: 12/11/2020 1:45 PM Medical Record Number: [id number] Patient Account Number: [id number] Date of Birth/Sex: [birth year] (65 y.o. F) Treating RN: Rogers Blocker Primary Care Provider: Blane Ohara Other Clinician: Lolita Cram Referring Provider: Blane Ohara Treating Provider/Extender: Rowan Blase in Treatment: 8 Verbal / Phone Orders: No Diagnosis Coding ICD-10 Coding Code Description E11.622 Type 2 diabetes mellitus with other skin ulcer I87.323 Chronic venous hypertension (idiopathic) with inflammation of bilateral lower extremity L97.828 Non-pressure chronic ulcer of other part of left lower leg with other specified severity L97.818 Non-pressure chronic ulcer of other part of right lower leg with other specified severity I50.22 Chronic systolic (congestive) heart failure Follow-up Appointments o Return Appointment in 2 weeks. Bathing/ Shower/ Hygiene o May shower; gently cleanse wound with antibacterial soap, rinse and pat dry prior to dressing wounds Edema Control - Lymphedema / Segmental Compressive Device / Other Bilateral Lower Extremities o Elevate, Exercise Daily and Avoid Standing for Long Periods of Time. o Elevate legs to the level of the heart and pump ankles as often as possible o Elevate leg(s) parallel to the floor when sitting. Wound Treatment Wound #2 - Lower Leg Wound Laterality: Right Primary Dressing: Silvercel Small 2x2 (in/in) (Generic) 3 x Per Week/30 Days Discharge Instructions: Apply Silvercel Small 2x2 (in/in) as instructed Secondary Dressing: ABD Pad 5x9 (in/in) 3 x Per Week/30 Days Discharge  Instructions:  Cover with ABD pad Secondary Dressing: Kerlix 4.5 x 4.1 (in/yd) 3 x Per Week/30 Days Discharge Instructions: Apply Kerlix 4.5 x 4.1 (in/yd) as instructed Secured With: 39M Medipore H Soft Cloth Surgical Tape, 2x2 (in/yd) (Generic) 3 x Per Week/30 Days Discharge Instructions: Secure kerlix Compression Wrap: Jobst Farrow Wrap 4000 3 x Per Week/30 Days Discharge Instructions: Secure kerlix with compression wrap Wound #3 - Lower Leg Wound Laterality: Right, Distal Primary Dressing: Silvercel Small 2x2 (in/in) (Generic) 3 x Per Week/30 Days Discharge Instructions: Apply Silvercel Small 2x2 (in/in) as instructed Secondary Dressing: ABD Pad 5x9 (in/in) 3 x Per Week/30 Days Discharge Instructions: Cover with ABD pad Secondary Dressing: Kerlix 4.5 x 4.1 (in/yd) 3 x Per Week/30 Days Discharge Instructions: Apply Kerlix 4.5 x 4.1 (in/yd) as instructed Secured With: 39M Medipore H Soft Cloth Surgical Tape, 2x2 (in/yd) (Generic) 3 x Per Week/30 Days Discharge Instructions: Secure kerlix Compression Wrap: Jobst Farrow Wrap 4000 3 x Per Week/30 Days Discharge Instructions: Secure kerlix with compression wrap Breanna Bennett, Breanna Bennett ([id number]) Wound #4 - Lower Leg Wound Laterality: Left Primary Dressing: Silvercel Small 2x2 (in/in) (Generic) 3 x Per Week/30 Days Discharge Instructions: Apply Silvercel Small 2x2 (in/in) as instructed Secondary Dressing: ABD Pad 5x9 (in/in) 3 x Per Week/30 Days Discharge Instructions: Cover with ABD pad Secondary Dressing: Kerlix 4.5 x 4.1 (in/yd) 3 x Per Week/30 Days Discharge Instructions: Apply Kerlix 4.5 x 4.1 (in/yd) as instructed Secured With: 39M Medipore H Soft Cloth Surgical Tape, 2x2 (in/yd) (Generic) 3 x Per Week/30 Days Discharge Instructions: Secure kerlix Compression Wrap: Jobst Farrow Wrap 4000 3 x Per Week/30 Days Discharge Instructions: Secure kerlix with compression wrap Wound #5 - Lower Leg Wound Laterality: Right, Medial Primary Dressing:  Silvercel Small 2x2 (in/in) (Generic) 3 x Per Week/30 Days Discharge Instructions: Apply Silvercel Small 2x2 (in/in) as instructed Secondary Dressing: ABD Pad 5x9 (in/in) 3 x Per Week/30 Days Discharge Instructions: Cover with ABD pad Secondary Dressing: Kerlix 4.5 x 4.1 (in/yd) 3 x Per Week/30 Days Discharge Instructions: Apply Kerlix 4.5 x 4.1 (in/yd) as instructed Secured With: 39M Medipore H Soft Cloth Surgical Tape, 2x2 (in/yd) (Generic) 3 x Per Week/30 Days Discharge Instructions: Secure kerlix Compression Wrap: Jobst Farrow Wrap 4000 3 x Per Week/30 Days Discharge Instructions: Secure kerlix with compression wrap Wound #6 - Lower Leg Wound Laterality: Left, Medial Primary Dressing: Silvercel Small 2x2 (in/in) (Generic) 3 x Per Week/30 Days Discharge Instructions: Apply Silvercel Small 2x2 (in/in) as instructed Secondary Dressing: ABD Pad 5x9 (in/in) 3 x Per Week/30 Days Discharge Instructions: Cover with ABD pad Secondary Dressing: Kerlix 4.5 x 4.1 (in/yd) 3 x Per Week/30 Days Discharge Instructions: Apply Kerlix 4.5 x 4.1 (in/yd) as instructed Secured With: 39M Medipore H Soft Cloth Surgical Tape, 2x2 (in/yd) (Generic) 3 x Per Week/30 Days Discharge Instructions: Secure kerlix Compression Wrap: Jobst Farrow Wrap 4000 3 x Per Week/30 Days Discharge Instructions: Secure kerlix with compression wrap Wound #7 - Lower Leg Wound Laterality: Left, Posterior Primary Dressing: Silvercel Small 2x2 (in/in) (Generic) 3 x Per Week/30 Days Discharge Instructions: Apply Silvercel Small 2x2 (in/in) as instructed Secondary Dressing: ABD Pad 5x9 (in/in) 3 x Per Week/30 Days Discharge Instructions: Cover with ABD pad Secondary Dressing: Kerlix 4.5 x 4.1 (in/yd) 3 x Per Week/30 Days Discharge Instructions: Apply Kerlix 4.5 x 4.1 (in/yd) as instructed Secured With: 39M Medipore H Soft Cloth Surgical Tape, 2x2 (in/yd) (Generic) 3 x Per Week/30 Days Discharge Instructions: Secure kerlix Compression Wrap:  Jobst Farrow Wrap 4000 3 x Per  Week/30 Days Discharge Instructions: Secure kerlix with compression wrap Breanna Bennett, Breanna Bennett ([id number]) Wound #8 - Abdomen - Lower Quadrant Wound Laterality: Medial Cleanser: Normal Saline 3 x Per Week/15 Days Discharge Instructions: Wash your hands with soap and water. Remove old dressing, discard into plastic bag and place into trash. Cleanse the wound with Normal Saline prior to applying a clean dressing using gauze sponges, not tissues or cotton balls. Do not scrub or use excessive force. Pat dry using gauze sponges, not tissue or cotton balls. Primary Dressing: Hydrofera Blue Ready Transfer Foam, 4x5 (in/in) 3 x Per Week/15 Days Discharge Instructions: Apply Hydrofera Blue Ready to wound bed as directed-cut to size of wound (extras sent with patient) Secondary Dressing: Telfa Adhesive Island Dressing, 4x4 (in/in) 3 x Per Week/15 Days Discharge Instructions: Apply over dressing to secure in place. Laboratory o Bacteria identified in Wound by Culture (MICRO) - Abdomen oooo LOINC Code: 6462-6 oooo Convenience Name: Wound culture routine Patient Medications Allergies: No Known Drug Allergies Notifications Medication Indication Start End cefdinir 12/14/2020 DOSE 1 - oral 300 mg capsule - 1 capsule oral taken 2 times per day for 14 days Electronic Signature(s) Signed: 12/14/2020 3:51:12 PM By: Lenda Kelp PA-C Previous Signature: 12/11/2020 5:05:23 PM Version By: Lajean Manes RN Previous Signature: 12/12/2020 12:58:56 PM Version By: Lenda Kelp PA-C Entered By: Lenda Kelp on 12/14/2020 15:51:12 Breanna Bennett ([id number]) -------------------------------------------------------------------------------- Problem List Details Patient Name: Breanna Bennett Date of Service: 12/11/2020 1:45 PM Medical Record Number: [id number] Patient Account Number: [id number] Date of Birth/Sex: [birth year] (65 y.o. F) Treating RN: Rogers Blocker Primary Care  Provider: Blane Ohara Other Clinician: Lolita Cram Referring Provider: Blane Ohara Treating Provider/Extender: Rowan Blase in Treatment: 8 Active Problems ICD-10 Encounter Code Description Active Date MDM Diagnosis E11.622 Type 2 diabetes mellitus with other skin ulcer 10/10/2020 No Yes I87.323 Chronic venous hypertension (idiopathic) with inflammation of bilateral 10/10/2020 No Yes lower extremity L97.828 Non-pressure chronic ulcer of other part of left lower leg with other 10/10/2020 No Yes specified severity L97.818 Non-pressure chronic ulcer of other part of right lower leg with other 10/10/2020 No Yes specified severity L02.211 Cutaneous abscess of abdominal wall 12/11/2020 No Yes L98.492 Non-pressure chronic ulcer of skin of other sites with fat layer exposed 12/11/2020 No Yes I50.22 Chronic systolic (congestive) heart failure 10/10/2020 No Yes Inactive Problems Resolved Problems Electronic Signature(s) Signed: 12/11/2020 3:19:50 PM By: Lenda Kelp PA-C Previous Signature: 12/11/2020 1:52:14 PM Version By: Lenda Kelp PA-C Entered By: Lenda Kelp on 12/11/2020 15:19:50 Breanna Bennett ([id number]) -------------------------------------------------------------------------------- Progress Note Details Patient Name: Breanna Bennett Date of Service: 12/11/2020 1:45 PM Medical Record Number: [id number] Patient Account Number: [id number] Date of Birth/Sex: 12-05-1955 (65 y.o. F) Treating RN: Rogers Blocker Primary Care Provider: Blane Ohara Other Clinician: Lolita Cram Referring Provider: Blane Ohara Treating Provider/Extender: Rowan Blase in Treatment: 8 Subjective Chief Complaint Information obtained from Patient 10/10/2020; patient is here with wounds on her bilateral lower legs History of Present Illness (HPI) ADMISSION 10/10/2020 This is a pleasant woman from [facility] She is here accompanied by her daughter for review of  wounds on her bilateral lower legs. Seems like these are chronic and have been present for the most part for about a year. She had compression stockings at one point but I am not sure that she is actually been wearing them. She has about 4 open areas on the left and more substantially for open areas on the  right. She is a diabetic but is not on any current treatment. Her most other substantive problem is a severe cardiomyopathy with an ejection fraction less than 20%. Her daughter tells me that she runs a generally low blood pressure and she only takes 10 mg of Lasix with an adjustment if her blood pressure is over a certain value giving it additional 10 mg. It does not seem like she has gained weight recently she says he is at 160 pounds and for her this is actually a reduction. They have been using Medihoney and Neosporin to this area. No recent compression wrap use. There are 4 open areas on the left and 4 open areas on the right. ABIs in our clinic were 1.35 on the right and 1.22 on the left 11/26/2020 on evaluation today patient appears to be doing poorly in regard to her lower extremities. With that being said the biggest issue currently seems to be really that she is having issues with extreme edema of the lower extremities and Dr. Leanord Hawkingobson was concerned about the fact that to be honest the patient really needs to be seen by cardiology to see if there is anything they can do to help diurese her more as he was concerned about doing too strong of a compression. For that reason we have been doing Kerlix and Coban only for now patient has not seen her cardiologist we have not seen her since February 16 of this year. Unfortunately her wounds appear to be a bit worse than they were previous. 12/03/20 upon evaluation today patient appears to be doing well with regard to her wounds. She has been tolerating the dressing changes without complication. Fortunately there does not appear to be any signs of  infection. She still has a tremendous amount of edema but this is being managed by her cardiologist in fact he did put her on some medications yesterday. This included increasing her furosemide to 20 mg 2 times a day. He is also lowering her blood pressure medication to compensate. Obviously this is a very fine balance with this patient. It was actually mentioned in the note that the patient may need to consider palliative care. Obviously were trying to avoid making anything worse while still healing her wounds. 12/11/2020 upon evaluation today patient appears to be doing well with regard to her wounds over the lower extremities. Unfortunately she has a new wound in the abdominal area as well underneath the periumbilical region which unfortunately she did not tell anybody about into I walked into the room. With that being said I think that we need to manage this as well today. She does have again some good granulation epithelization in regard to leg ulcers I feel like they are doing better. Although I am not really sure exactly what she is doing with the dressing she seems to be putting a lot of Band-Aids on the area. That is not really the ideal thing. Objective Constitutional Well-nourished and well-hydrated in no acute distress. Vitals Time Taken: 1:46 PM, Height: 68 in, Weight: 160 lbs, BMI: 24.3, Temperature: 97.6 F, Pulse: 86 bpm, Respiratory Rate: 18 breaths/min, Blood Pressure: 92/69 mmHg. Respiratory normal breathing without difficulty. Psychiatric this patient is able to make decisions and demonstrates good insight into disease process. Alert and Oriented x 3. pleasant and cooperative. General Notes: Upon inspection patient has good granulation epithelization pretty much over the lower extremities I feel like she is making progress here and things do appear to be better. With regard to  the abdominal area there was some necrotic debris I cleaned this out with a Q-tip Breanna Bennett, Breanna Bennett  ([id number]) currently in saline and gauze to mechanically debride some of this no sharp debridement performed as she was having a lot of discomfort. Integumentary (Hair, Skin) Wound #2 status is Open. Original cause of wound was Gradually Appeared. The date acquired was: 08/26/2019. The wound has been in treatment 8 weeks. The wound is located on the Right Lower Leg. The wound measures 1.8cm length x 2cm width x 0.2cm depth; 2.827cm^2 area and 0.565cm^3 volume. There is Fat Layer (Subcutaneous Tissue) exposed. There is no tunneling or undermining noted. There is a medium amount of serosanguineous drainage noted. There is medium (34-66%) red, pink granulation within the wound bed. There is a medium (34-66%) amount of necrotic tissue within the wound bed including Adherent Slough. Wound #3 status is Open. Original cause of wound was Gradually Appeared. The date acquired was: 08/26/2019. The wound has been in treatment 8 weeks. The wound is located on the Right,Distal Lower Leg. The wound measures 0.6cm length x 0.7cm width x 0.1cm depth; 0.33cm^2 area and 0.033cm^3 volume. There is Fat Layer (Subcutaneous Tissue) exposed. There is no tunneling or undermining noted. There is a medium amount of serosanguineous drainage noted. There is medium (34-66%) red, pink granulation within the wound bed. There is a medium (34-66%) amount of necrotic tissue within the wound bed including Adherent Slough. Wound #4 status is Open. Original cause of wound was Gradually Appeared. The date acquired was: 08/26/2019. The wound has been in treatment 8 weeks. The wound is located on the Left Lower Leg. The wound measures 4.8cm length x 6.8cm width x 0.2cm depth; 25.635cm^2 area and 5.127cm^3 volume. There is Fat Layer (Subcutaneous Tissue) exposed. There is no tunneling or undermining noted. There is a large amount of serous drainage noted. There is medium (34-66%) pink, pale granulation within the wound bed. There is a medium  (34-66%) amount of necrotic tissue within the wound bed including Adherent Slough. Wound #5 status is Open. Original cause of wound was Gradually Appeared. The date acquired was: 02/07/2020. The wound has been in treatment 8 weeks. The wound is located on the Right,Medial Lower Leg. The wound measures 6cm length x 4.1cm width x 0.2cm depth; 19.321cm^2 area and 3.864cm^3 volume. There is Fat Layer (Subcutaneous Tissue) exposed. There is a large amount of serous drainage noted. The wound margin is flat and intact. There is medium (34-66%) red granulation within the wound bed. There is a medium (34-66%) amount of necrotic tissue within the wound bed including Adherent Slough. Wound #6 status is Open. Original cause of wound was Gradually Appeared. The date acquired was: 02/07/2020. The wound has been in treatment 8 weeks. The wound is located on the Left,Medial Lower Leg. The wound measures 0.6cm length x 0.8cm width x 0.1cm depth; 0.377cm^2 area and 0.038cm^3 volume. There is Fat Layer (Subcutaneous Tissue) exposed. There is no tunneling or undermining noted. There is a medium amount of serous drainage noted. There is medium (34-66%) red, pink granulation within the wound bed. There is a medium (34-66%) amount of necrotic tissue within the wound bed including Adherent Slough. Wound #7 status is Open. Original cause of wound was Gradually Appeared. The date acquired was: 02/07/2020. The wound has been in treatment 8 weeks. The wound is located on the Left,Posterior Lower Leg. The wound measures 0.1cm length x 0.1cm width x 0.1cm depth; 0.008cm^2 area and 0.001cm^3 volume. There is a none present  amount of drainage noted. There is no granulation within the wound bed. There is no necrotic tissue within the wound bed. Wound #8 status is Open. Original cause of wound was Gradually Appeared. The date acquired was: 11/27/2020. The wound is located on the Medial Abdomen - Lower Quadrant. The wound measures 4cm  length x 1.6cm width x 0.6cm depth; 5.027cm^2 area and 3.016cm^3 volume. There is Fat Layer (Subcutaneous Tissue) exposed. There is no tunneling or undermining noted. There is a medium amount of purulent drainage noted. There is small (1-33%) red granulation within the wound bed. There is a large (67-100%) amount of necrotic tissue within the wound bed including Adherent Slough. Assessment Active Problems ICD-10 Type 2 diabetes mellitus with other skin ulcer Chronic venous hypertension (idiopathic) with inflammation of bilateral lower extremity Non-pressure chronic ulcer of other part of left lower leg with other specified severity Non-pressure chronic ulcer of other part of right lower leg with other specified severity Cutaneous abscess of abdominal wall Non-pressure chronic ulcer of skin of other sites with fat layer exposed Chronic systolic (congestive) heart failure Plan Follow-up Appointments: Return Appointment in 2 weeks. Bathing/ Shower/ Hygiene: May shower; gently cleanse wound with antibacterial soap, rinse and pat dry prior to dressing wounds Edema Control - Lymphedema / Segmental Compressive Device / Other: Elevate, Exercise Daily and Avoid Standing for Long Periods of Time. Elevate legs to the level of the heart and pump ankles as often as possible Elevate leg(s) parallel to the floor when sitting. Laboratory ordered were: Wound culture routine - Abdomen Breanna Bennett, Breanna Bennett ([id number]) The following medication(s) was prescribed: cefdinir oral 300 mg capsule 1 1 capsule oral taken 2 times per day for 14 days starting 12/14/2020 WOUND #2: - Lower Leg Wound Laterality: Right Primary Dressing: Silvercel Small 2x2 (in/in) (Generic) 3 x Per Week/30 Days Discharge Instructions: Apply Silvercel Small 2x2 (in/in) as instructed Secondary Dressing: ABD Pad 5x9 (in/in) 3 x Per Week/30 Days Discharge Instructions: Cover with ABD pad Secondary Dressing: Kerlix 4.5 x 4.1 (in/yd) 3 x Per  Week/30 Days Discharge Instructions: Apply Kerlix 4.5 x 4.1 (in/yd) as instructed Secured With: 47M Medipore H Soft Cloth Surgical Tape, 2x2 (in/yd) (Generic) 3 x Per Week/30 Days Discharge Instructions: Secure kerlix Compression Wrap: Jobst Farrow Wrap 4000 3 x Per Week/30 Days Discharge Instructions: Secure kerlix with compression wrap WOUND #3: - Lower Leg Wound Laterality: Right, Distal Primary Dressing: Silvercel Small 2x2 (in/in) (Generic) 3 x Per Week/30 Days Discharge Instructions: Apply Silvercel Small 2x2 (in/in) as instructed Secondary Dressing: ABD Pad 5x9 (in/in) 3 x Per Week/30 Days Discharge Instructions: Cover with ABD pad Secondary Dressing: Kerlix 4.5 x 4.1 (in/yd) 3 x Per Week/30 Days Discharge Instructions: Apply Kerlix 4.5 x 4.1 (in/yd) as instructed Secured With: 47M Medipore H Soft Cloth Surgical Tape, 2x2 (in/yd) (Generic) 3 x Per Week/30 Days Discharge Instructions: Secure kerlix Compression Wrap: Jobst Farrow Wrap 4000 3 x Per Week/30 Days Discharge Instructions: Secure kerlix with compression wrap WOUND #4: - Lower Leg Wound Laterality: Left Primary Dressing: Silvercel Small 2x2 (in/in) (Generic) 3 x Per Week/30 Days Discharge Instructions: Apply Silvercel Small 2x2 (in/in) as instructed Secondary Dressing: ABD Pad 5x9 (in/in) 3 x Per Week/30 Days Discharge Instructions: Cover with ABD pad Secondary Dressing: Kerlix 4.5 x 4.1 (in/yd) 3 x Per Week/30 Days Discharge Instructions: Apply Kerlix 4.5 x 4.1 (in/yd) as instructed Secured With: 47M Medipore H Soft Cloth Surgical Tape, 2x2 (in/yd) (Generic) 3 x Per Week/30 Days Discharge Instructions: Secure kerlix Compression Wrap:  Jobst Farrow Wrap 4000 3 x Per Week/30 Days Discharge Instructions: Secure kerlix with compression wrap WOUND #5: - Lower Leg Wound Laterality: Right, Medial Primary Dressing: Silvercel Small 2x2 (in/in) (Generic) 3 x Per Week/30 Days Discharge Instructions: Apply Silvercel Small 2x2 (in/in)  as instructed Secondary Dressing: ABD Pad 5x9 (in/in) 3 x Per Week/30 Days Discharge Instructions: Cover with ABD pad Secondary Dressing: Kerlix 4.5 x 4.1 (in/yd) 3 x Per Week/30 Days Discharge Instructions: Apply Kerlix 4.5 x 4.1 (in/yd) as instructed Secured With: 80M Medipore H Soft Cloth Surgical Tape, 2x2 (in/yd) (Generic) 3 x Per Week/30 Days Discharge Instructions: Secure kerlix Compression Wrap: Jobst Farrow Wrap 4000 3 x Per Week/30 Days Discharge Instructions: Secure kerlix with compression wrap WOUND #6: - Lower Leg Wound Laterality: Left, Medial Primary Dressing: Silvercel Small 2x2 (in/in) (Generic) 3 x Per Week/30 Days Discharge Instructions: Apply Silvercel Small 2x2 (in/in) as instructed Secondary Dressing: ABD Pad 5x9 (in/in) 3 x Per Week/30 Days Discharge Instructions: Cover with ABD pad Secondary Dressing: Kerlix 4.5 x 4.1 (in/yd) 3 x Per Week/30 Days Discharge Instructions: Apply Kerlix 4.5 x 4.1 (in/yd) as instructed Secured With: 80M Medipore H Soft Cloth Surgical Tape, 2x2 (in/yd) (Generic) 3 x Per Week/30 Days Discharge Instructions: Secure kerlix Compression Wrap: Jobst Farrow Wrap 4000 3 x Per Week/30 Days Discharge Instructions: Secure kerlix with compression wrap WOUND #7: - Lower Leg Wound Laterality: Left, Posterior Primary Dressing: Silvercel Small 2x2 (in/in) (Generic) 3 x Per Week/30 Days Discharge Instructions: Apply Silvercel Small 2x2 (in/in) as instructed Secondary Dressing: ABD Pad 5x9 (in/in) 3 x Per Week/30 Days Discharge Instructions: Cover with ABD pad Secondary Dressing: Kerlix 4.5 x 4.1 (in/yd) 3 x Per Week/30 Days Discharge Instructions: Apply Kerlix 4.5 x 4.1 (in/yd) as instructed Secured With: 80M Medipore H Soft Cloth Surgical Tape, 2x2 (in/yd) (Generic) 3 x Per Week/30 Days Discharge Instructions: Secure kerlix Compression Wrap: Jobst Farrow Wrap 4000 3 x Per Week/30 Days Discharge Instructions: Secure kerlix with compression wrap WOUND  #8: - Abdomen - Lower Quadrant Wound Laterality: Medial Cleanser: Normal Saline 3 x Per Week/15 Days Discharge Instructions: Wash your hands with soap and water. Remove old dressing, discard into plastic bag and place into trash. Cleanse the wound with Normal Saline prior to applying a clean dressing using gauze sponges, not tissues or cotton balls. Do not scrub or use excessive force. Pat dry using gauze sponges, not tissue or cotton balls. Primary Dressing: Hydrofera Blue Ready Transfer Foam, 4x5 (in/in) 3 x Per Week/15 Days Discharge Instructions: Apply Hydrofera Blue Ready to wound bed as directed-cut to size of wound (extras sent with patient) Secondary Dressing: Telfa Adhesive [place] Dressing, 4x4 (in/in) 3 x Per Week/15 Days Discharge Instructions: Apply over dressing to secure in place. Breanna Bennett, Breanna Bennett ([id number]) 1. Would recommend currently that we go ahead and initiate treatment with [facility] for the abdominal area I think that is good to be the most appropriate thing. 2. I am also can recommend the silver alginate dressing be continued for the lower extremities. 3. I am also can suggest that the patient continue with the Farrow wrap 4000 for her lower extremity as well. I feel like this is doing a good job helping with edema control she really would not tolerate the compression wrap so that is the main issue as far as that is concerned we could compression wrap for her but she does not keep anything on. 4. I am also can recommend that the patient continue to monitor for  any signs of worsening regard to the abdominal area. I am going to send a culture today. I am also going to go ahead and continue to monitor for any signs of worsening here. If anything changes to let me know otherwise based on the result culture we will initiate any antibiotics as necessary. We will see patient back for reevaluation in 2 weeks here in the clinic. If anything worsens or changes patient will  contact our office for additional recommendations. 12/14/2020 I did review the patient's wound culture today. And subsequently she does have a positive infection for Serratia noted. Subsequently I am to send in a prescription for [facility] and I think that is appropriate at this point. We will see how she responds to the medication and then go from there. Electronic Signature(s) Signed: 12/14/2020 3:52:17 PM By: Lenda Kelp PA-C Previous Signature: 12/11/2020 6:02:21 PM Version By: Lenda Kelp PA-C Entered By: Lenda Kelp on 12/14/2020 15:52:17 Breanna Bennett ([id number]) -------------------------------------------------------------------------------- SuperBill Details Patient Name: Breanna Bennett Date of Service: 12/11/2020 Medical Record Number: [id number] Patient Account Number: [id number] Date of Birth/Sex: 1956-02-25 (65 y.o. F) Treating RN: Rogers Blocker Primary Care Provider: Blane Ohara Other Clinician: Lolita Cram Referring Provider: Blane Ohara Treating Provider/Extender: Rowan Blase in Treatment: 8 Diagnosis Coding ICD-10 Codes Code Description [phone] Type 2 diabetes mellitus with other skin ulcer I87.323 Chronic venous hypertension (idiopathic) with inflammation of bilateral lower extremity L97.828 Non-pressure chronic ulcer of other part of left lower leg with other specified severity L97.818 Non-pressure chronic ulcer of other part of right lower leg with other specified severity L02.211 Cutaneous abscess of abdominal wall L98.492 Non-pressure chronic ulcer of skin of other sites with fat layer exposed I50.22 Chronic systolic (congestive) heart failure Facility Procedures CPT4 Code: 63149702 Description: [phone] - WOUND CARE VISIT-LEV 5 EST PT Modifier: Quantity: 1 Physician Procedures CPT4 Code: 8850277 Description: 99214 - WC PHYS LEVEL 4 - EST PT Modifier: Quantity: 1 CPT4 Code: Description: ICD-10 Diagnosis Description E11.622 Type 2  diabetes mellitus with other skin ulcer L97.828 Non-pressure chronic ulcer of other part of left lower leg with other speci I87.323 Chronic venous hypertension (idiopathic) with inflammation of  bilateral low L02.211 Cutaneous abscess of abdominal wall Modifier: fied severity er extremity Quantity: Electronic Signature(s) Signed: 12/11/2020 6:02:48 PM By: Lenda Kelp PA-C Previous Signature: 12/11/2020 5:05:23 PM Version By: Phillis Haggis, Dondra Prader RN Entered By: Lenda Kelp on 12/11/2020 18:02:48

## 2020-12-11 NOTE — Telephone Encounter (Signed)
Patient informed of results by Lequita Halt, RN

## 2020-12-12 NOTE — Progress Notes (Signed)
Subjective:  Patient ID: Breanna Bennett, female    DOB: 26-Nov-1955  Age: 65 y.o. MRN: [id number]  Chief Complaint  Patient presents with  . Hypertension  . Hyperlipidemia    HPI Patient is a 65 year old African-American female with hypertension, systolic congestive heart failure due to cardiomyopathy, diabetes, and hyperlipidemia.  She has a history of a stroke with some weakness remaining.  Patient no longer really has hypertension in fact she is low normal blood pressure she is on carvedilol, spironolactone, and Lasix.  She denies dizziness or being prone to falls.  Is unclear how long she has been diabetic but its been at least a year.  She does not have a glucometer and would like them to check her sugars.  She does have polyuria.  Not usually polyphagia or polydipsia.  She is currently seeing wound care and per the patient the wound is improving.  She currently has her right leg wrapped and is being followed by wound care.  Patient is in a wheelchair.  She does state that she is very active and get out of the wheelchair and will garden and walk especially during the summer times.  She is currently accompanied with her husband.   Current Outpatient Medications on File Prior to Visit  Medication Sig Dispense Refill  . allopurinol (ZYLOPRIM) 100 MG tablet Take 100 mg by mouth 2 (two) times daily.    Marland Kitchen atorvastatin (LIPITOR) 10 MG tablet Take 10 mg by mouth daily.    . carvedilol (COREG) 3.125 MG tablet Take 1 tablet (3.125 mg total) by mouth 2 (two) times daily with a meal. 60 tablet 1  . famotidine (PEPCID) 40 MG tablet Take 40 mg by mouth at bedtime.     . fluticasone (FLONASE) 50 MCG/ACT nasal spray Place 2 sprays into both nostrils daily as needed for allergies or rhinitis. 18.2 mL 3  . furosemide (LASIX) 20 MG tablet Take 1 tablet (20 mg total) by mouth 2 (two) times daily. 60 tablet 1  . levothyroxine (SYNTHROID) 50 MCG tablet Take 1 tablet (50 mcg total) by mouth daily before  breakfast. 90 tablet 1  . loratadine (CLARITIN) 10 MG tablet Take 1 tablet (10 mg total) by mouth daily as needed for allergies or rhinitis. 90 tablet 0  . montelukast (SINGULAIR) 10 MG tablet Take 10 mg by mouth at bedtime.    . potassium chloride (KLOR-CON) 10 MEQ tablet TAKE 1 TABLET(10 MEQ) BY MOUTH DAILY (Patient taking differently: Take 10 mEq by mouth once.) 90 tablet 1  . spironolactone (ALDACTONE) 25 MG tablet TAKE 1/2 TABLET(12.5 MG) BY MOUTH DAILY (Patient taking differently: Take 12.5 mg by mouth once.) 45 tablet 1  . Vitamin D, Ergocalciferol, (DRISDOL) 1.25 MG (50000 UT) CAPS capsule Take 50,000 Units by mouth every Monday.     . warfarin (COUMADIN) 2.5 MG tablet TAKE 1 TABLET BY MOUTH DAILY EXCEPT 1/2 TABLET ON MONDAY, WEDNESDAY, FRIDAY OR AS DIRECTED (Patient taking differently: Take 2.5 mg by mouth 3 (three) times a week. TAKE 1 TABLET BY MOUTH DAILY EXCEPT 1/2 TABLET ON MONDAY, WEDNESDAY, FRIDAY OR AS DIRECTED) 100 tablet 0   No current facility-administered medications on file prior to visit.   Past Medical History:  Diagnosis Date  . Acute CVA (cerebrovascular accident) ([place]) 05/30/2019  . Acute respiratory failure ([place])   . Acute systolic CHF (congestive heart failure) ([place]) 01/12/2019  . Cardiac cachexia 07/04/2020  . Chronic systolic CHF (congestive heart failure) ([place])   . Class 2  severe obesity due to excess calories with serious comorbidity and body mass index (BMI) of 38.0 to 38.9 in adult [facility]) 10/19/2018  . Cough due to bronchospasm 02/16/2019  . Elevated hemoglobin ([place])   . Elevated uric acid in blood 06/06/2019  . Essential hypertension 01/12/2019  . Gastroesophageal reflux disease 10/19/2018  . GERD (gastroesophageal reflux disease)   . History of Coumadin therapy 06/16/2019  . History of CVA (cerebrovascular accident) 05/30/2019  . History of gout 12/06/2019  . History of hemiplegia 05/30/2019  . Hospital discharge follow-up 06/24/2019  . Hyperlipidemia   .  Hypertension   . Hypokalemia 12/06/2019  . Impaired mobility and ADLs 05/30/2019  . Late effect of cerebrovascular accident (CVA) 12/12/2019  . LBBB (left bundle branch block)   . Left ventricular ejection fraction less than 20% 05/30/2019  . Middle cerebral artery embolism, 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  . Mild renal insufficiency   . Mitral regurgitation   . NICM (nonischemic cardiomyopathy) ([place])   . Nonischemic cardiomyopathy [facility]) cardiac catheterization May 2020 showed normal coronaries, ejection fraction 30% 02/09/2019  . Open wound 1 year ago   Bilateral legs  . Perennial allergic rhinitis 02/16/2019  . Peripheral edema 12/06/2019  . Pre-diabetes   . Stroke (cerebrum) ([place]) - R MCA infarct s/p mechanical thrombectomy 05/19/2019  . Systolic CHF, acute ([place]) 02/09/2019  . Tricuspid regurgitation   . Type 2 diabetes mellitus with hyperglycemia, without long-term current use of insulin ([place]) 05/30/2019  . Type 2 diabetes mellitus, uncontrolled ([place]) 05/27/2019   Past Surgical History:  Procedure Laterality Date  . IR ANGIO VERTEBRAL SEL SUBCLAVIAN INNOMINATE UNI R MOD SED  05/19/2019  . IR CT HEAD LTD  05/19/2019  . IR PERCUTANEOUS ART THROMBECTOMY/INFUSION INTRACRANIAL INC DIAG ANGIO  05/19/2019  . RADIOLOGY WITH ANESTHESIA N/A 05/19/2019   Procedure: IR WITH ANESTHESIA;  Surgeon: Luanne Bras, MD;  Location: [place];  Service: Radiology;  Laterality: N/A;  . RIGHT/LEFT HEART CATH AND CORONARY ANGIOGRAPHY N/A 01/13/2019   Procedure: RIGHT/LEFT HEART CATH AND CORONARY ANGIOGRAPHY;  Surgeon: Lorretta Harp, MD;  Location: [place] [facility];  Service: Cardiovascular;  Laterality: N/A;  . TUBAL LIGATION      Family History  Problem Relation Age of Onset  . CAD Mother   . Heart failure Mother   . Hypertension Mother   . Colon cancer Father   . Cancer Brother    Social History   Socioeconomic History  . Marital status: Unknown    Spouse name: Not on file  . Number of children: Not on file  .  Years of education: Not on file  . Highest education level: Not on file  Occupational History  . Not on file  Tobacco Use  . Smoking status: Never Smoker  . Smokeless tobacco: Never Used  Vaping Use  . Vaping Use: Never used  Substance and Sexual Activity  . Alcohol use: Never  . Drug use: Never  . Sexual activity: Not on file  Other Topics Concern  . Not on file  Social History Narrative  . Not on file   Social Determinants of Health   Financial Resource Strain: Not on file  Food Insecurity: Not on file  Transportation Needs: Not on file  Physical Activity: Not on file  Stress: Not on file  Social Connections: Not on file    Review of Systems  Constitutional: Negative for chills, fatigue and fever.  HENT: Positive for rhinorrhea. Negative for congestion, ear pain and sore throat.  Respiratory: Positive for cough. Negative for shortness of breath.   Cardiovascular: Negative for chest pain.  Gastrointestinal: Negative for abdominal pain, constipation, diarrhea, nausea and vomiting.  Endocrine: Positive for polyuria. Negative for polydipsia and polyphagia.  Genitourinary: Negative for dysuria and urgency.  Musculoskeletal: Negative for back pain and myalgias.  Neurological: Negative for dizziness, weakness, light-headedness and headaches.  Psychiatric/Behavioral: Negative for dysphoric mood. The patient is not nervous/anxious.      Objective:  BP 100/70   Pulse 80   Temp (!) 97.3 F (36.3 C)   Resp 16   Ht '5\' 8"'  (1.727 m)   Wt 161 lb (73 kg)   BMI 24.48 kg/m   BP/Weight 12/13/2020 11/30/2020 3/81/8299  Systolic BP 371 96 88  Diastolic BP 70 62 58  Wt. (Lbs) 161 165 183  BMI 24.48 25.09 27.83    Physical Exam Vitals reviewed.  Constitutional:      Appearance: Normal appearance. She is normal weight.  Neck:     Vascular: No carotid bruit.  Cardiovascular:     Rate and Rhythm: Normal rate and regular rhythm.     Heart sounds: Murmur heard.    Pulmonary:      Effort: Pulmonary effort is normal. No respiratory distress.     Breath sounds: Normal breath sounds.  Abdominal:     General: Abdomen is flat. Bowel sounds are normal.     Palpations: Abdomen is soft.     Tenderness: There is no abdominal tenderness.  Neurological:     Mental Status: She is alert and oriented to person, place, and time.  Psychiatric:        Mood and Affect: Mood normal.        Behavior: Behavior normal.    Foot exam deferred.  Patient is seeing wound care and right leg is wrapped  Diabetic Foot Exam - Simple   No data filed      Lab Results  Component Value Date   WBC 5.9 08/08/2020   HGB 12.5 08/08/2020   HCT 38.5 08/08/2020   PLT 316 08/08/2020   GLUCOSE 134 (H) 11/30/2020   CHOL 68 (L) 09/12/2020   TRIG 52 09/12/2020   HDL 17 (L) 09/12/2020   LDLCALC 38 09/12/2020   ALT 6 08/08/2020   AST 9 08/08/2020   NA 135 11/30/2020   K 4.0 11/30/2020   CL 99 11/30/2020   CREATININE 0.76 11/30/2020   BUN 20 11/30/2020   CO2 20 11/30/2020   TSH 2.960 08/08/2020   INR 2.0 11/13/2020   HGBA1C 6.9 (H) 08/08/2020      Assessment & Plan:   1. Diabetic glomerulopathy (HCC) Check sugars once daily in AM. Start Farxiga 5 mg once daily. Recommend low sugar diet.  Recommend exercise. Recommend eye doctor annually. - Ambulatory referral to Podiatry for foot care - Hemoglobin A1c - blood glucose meter kit and supplies KIT; Once a day fasting. E11.69  Dispense: 1 each; Refill: 0 - POCT UA - Microalbumin - CCM pharmacy monitoring  2. Mixed hyperlipidemia Continue current medications. Low-fat diet and exercise recommended. - Lipid panel - CCM pharmacy monitoring  3. Chronic systolic CHF (congestive heart failure) ([place]) Start on Farxiga 5 mg once daily. Continue Lasix, spironolactone, and carvedilol. Follow-up with Dr. Agustin Cree. Patient is a candidate for defibrillator however has refused. - CBC with Differential/Platelet - Comprehensive metabolic  panel  4. Nonischemic cardiomyopathy (HCC) Etiology of congestive heart failure.  5. Current use of long term anticoagulation On Coumadin.  Managed by cardiology  6. Acquired thrombophilia ([place]) Secondary to Coumadin.  Meds ordered this encounter  Medications  . blood glucose meter kit and supplies KIT    Sig: Once a day fasting. E11.69    Dispense:  1 each    Refill:  0    Order Specific Question:   Number of strips    Answer:   100    Order Specific Question:   Number of lancets    Answer:   100  . dapagliflozin propanediol (FARXIGA) 5 MG TABS tablet    Sig: Take 1 tablet (5 mg total) by mouth daily before breakfast.    Dispense:  30 tablet    Refill:  2    Orders Placed This Encounter  Procedures  . CBC with Differential/Platelet  . Comprehensive metabolic panel  . Lipid panel  . Hemoglobin A1c  . Ambulatory referral to Podiatry  . CCM pharmacy monitoring  . POCT UA - Microalbumin     Follow-up: Return in about 3 months (around 03/14/2021) for Fasting.  An After Visit Summary was printed and given to the patient.  Rochel Brome, MD [facility] [phone]

## 2020-12-13 ENCOUNTER — Ambulatory Visit (INDEPENDENT_AMBULATORY_CARE_PROVIDER_SITE_OTHER): Payer: 59 | Admitting: Family Medicine

## 2020-12-13 ENCOUNTER — Other Ambulatory Visit: Payer: Self-pay

## 2020-12-13 ENCOUNTER — Encounter: Payer: Self-pay | Admitting: Family Medicine

## 2020-12-13 VITALS — BP 100/70 | HR 80 | Temp 97.3°F | Resp 16 | Ht 68.0 in | Wt 161.0 lb

## 2020-12-13 DIAGNOSIS — E1121 Type 2 diabetes mellitus with diabetic nephropathy: Secondary | ICD-10-CM

## 2020-12-13 DIAGNOSIS — Z7901 Long term (current) use of anticoagulants: Secondary | ICD-10-CM

## 2020-12-13 DIAGNOSIS — E782 Mixed hyperlipidemia: Secondary | ICD-10-CM

## 2020-12-13 DIAGNOSIS — I5022 Chronic systolic (congestive) heart failure: Secondary | ICD-10-CM | POA: Diagnosis not present

## 2020-12-13 DIAGNOSIS — I428 Other cardiomyopathies: Secondary | ICD-10-CM | POA: Diagnosis not present

## 2020-12-13 DIAGNOSIS — D6869 Other thrombophilia: Secondary | ICD-10-CM

## 2020-12-13 DIAGNOSIS — N3001 Acute cystitis with hematuria: Secondary | ICD-10-CM

## 2020-12-13 LAB — POCT URINALYSIS DIPSTICK
Blood, UA: POSITIVE
Glucose, UA: NEGATIVE
Nitrite, UA: NEGATIVE
Protein, UA: POSITIVE — AB
Spec Grav, UA: >=1.03 — AB (ref 1.010–1.025)
Urobilinogen, UA: 4 E.U./dL — AB
pH, UA: 5.5 (ref 5.0–8.0)

## 2020-12-13 LAB — POCT UA - MICROALBUMIN: Microalbumin Ur, POC: 150 mg/L

## 2020-12-13 MED ORDER — BLOOD GLUCOSE MONITOR KIT
PACK | 0 refills | Status: DC
Start: 2020-12-13 — End: 2020-12-31

## 2020-12-13 MED ORDER — DAPAGLIFLOZIN PROPANEDIOL 5 MG PO TABS: 5.0000 mg | ORAL_TABLET | Freq: Every day | ORAL | 2 refills | Status: DC

## 2020-12-13 NOTE — Patient Instructions (Addendum)
Diabetes/high cholesterol/cardiomyopathy: Recommend start checking sugars once daily fasting.  Recommend keep a log. Low sugar diet and exercise recommended.  Start on Sharon 5 mg once daily.  This will help improve sugars as well as help with congestive heart failure. Samples of 10 mg given. Take a half a pill  Referring to foot doctor for diabetic foot care. Referring to Dinara Lupu family practice pharmacist to review medications with patient as well as make recommendations. Follow-up for annual wellness visit with Creola Corn, LPN in our office. Follow-up in 3 months fasting.

## 2020-12-14 LAB — COMPREHENSIVE METABOLIC PANEL
ALT: 3 IU/L (ref 0–32)
AST: 7 IU/L (ref 0–40)
Albumin/Globulin Ratio: 0.6 — ABNORMAL LOW (ref 1.2–2.2)
Albumin: 2.8 g/dL — ABNORMAL LOW (ref 3.8–4.8)
Alkaline Phosphatase: 104 IU/L (ref 44–121)
BUN/Creatinine Ratio: 17 (ref 12–28)
BUN: 14 mg/dL (ref 8–27)
Bilirubin Total: 1.4 mg/dL — ABNORMAL HIGH (ref 0.0–1.2)
CO2: 21 mmol/L (ref 20–29)
Calcium: 8.7 mg/dL (ref 8.7–10.3)
Chloride: 100 mmol/L (ref 96–106)
Creatinine, Ser: 0.84 mg/dL (ref 0.57–1.00)
Globulin, Total: 4.8 g/dL — ABNORMAL HIGH (ref 1.5–4.5)
Glucose: 99 mg/dL (ref 65–99)
Potassium: 4.7 mmol/L (ref 3.5–5.2)
Sodium: 137 mmol/L (ref 134–144)
Total Protein: 7.6 g/dL (ref 6.0–8.5)
eGFR: 78 mL/min/{1.73_m2} (ref >59)

## 2020-12-14 LAB — AEROBIC CULTURE W GRAM STAIN (SUPERFICIAL SPECIMEN)

## 2020-12-14 LAB — CBC WITH DIFFERENTIAL/PLATELET
Basophils Absolute: 0.1 10*3/uL (ref 0.0–0.2)
Basos: 1 %
EOS (ABSOLUTE): 0.1 10*3/uL (ref 0.0–0.4)
Eos: 2 %
Hematocrit: 42 % (ref 34.0–46.6)
Hemoglobin: 13.3 g/dL (ref 11.1–15.9)
Immature Grans (Abs): 0 10*3/uL (ref 0.0–0.1)
Immature Granulocytes: 1 %
Lymphocytes Absolute: 1.1 10*3/uL (ref 0.7–3.1)
Lymphs: 18 %
MCH: 26.1 pg — ABNORMAL LOW (ref 26.6–33.0)
MCHC: 31.7 g/dL (ref 31.5–35.7)
MCV: 83 fL (ref 79–97)
Monocytes Absolute: 0.5 10*3/uL (ref 0.1–0.9)
Monocytes: 9 %
Neutrophils Absolute: 4.2 10*3/uL (ref 1.4–7.0)
Neutrophils: 69 %
Platelets: 336 10*3/uL (ref 150–450)
RBC: 5.09 x10E6/uL (ref 3.77–5.28)
RDW: 17.1 % — ABNORMAL HIGH (ref 11.7–15.4)
WBC: 6.1 10*3/uL (ref 3.4–10.8)

## 2020-12-14 LAB — HEMOGLOBIN A1C
Est. average glucose Bld gHb Est-mCnc: 151 mg/dL
Hgb A1c MFr Bld: 6.9 % — ABNORMAL HIGH (ref 4.8–5.6)

## 2020-12-14 LAB — LIPID PANEL
Chol/HDL Ratio: 4.2 ratio (ref 0.0–4.4)
Cholesterol, Total: 76 mg/dL — ABNORMAL LOW (ref 100–199)
HDL: 18 mg/dL — ABNORMAL LOW (ref >39)
LDL Chol Calc (NIH): 44 mg/dL (ref 0–99)
Triglycerides: 57 mg/dL (ref 0–149)
VLDL Cholesterol Cal: 14 mg/dL (ref 5–40)

## 2020-12-14 LAB — CARDIOVASCULAR RISK ASSESSMENT

## 2020-12-17 LAB — URINE CULTURE

## 2020-12-19 ENCOUNTER — Other Ambulatory Visit: Payer: Self-pay

## 2020-12-19 DIAGNOSIS — N3090 Cystitis, unspecified without hematuria: Secondary | ICD-10-CM

## 2020-12-19 MED ORDER — NITROFURANTOIN MONOHYD MACRO 100 MG PO CAPS: 100.0000 mg | ORAL_CAPSULE | Freq: Two times a day (BID) | ORAL | 0 refills | Status: DC

## 2020-12-24 ENCOUNTER — Other Ambulatory Visit: Payer: Self-pay

## 2020-12-24 ENCOUNTER — Ambulatory Visit: Payer: 59 | Admitting: Physician Assistant

## 2020-12-24 MED ORDER — POTASSIUM CHLORIDE ER 10 MEQ PO TBCR: EXTENDED_RELEASE_TABLET | ORAL | 1 refills | Status: DC

## 2020-12-25 ENCOUNTER — Telehealth: Payer: Self-pay

## 2020-12-25 NOTE — Telephone Encounter (Signed)
Patient called reporting diarrhea the past two days.  She has been on Macrobid 100 mg BID since 4/27 for a bladder infection.  Is it OK for her to take Imodium?

## 2020-12-25 NOTE — Telephone Encounter (Signed)
Yes

## 2020-12-26 ENCOUNTER — Ambulatory Visit: Payer: 59 | Admitting: Internal Medicine

## 2020-12-28 ENCOUNTER — Other Ambulatory Visit: Payer: Self-pay

## 2020-12-28 DIAGNOSIS — Z8673 Personal history of transient ischemic attack (TIA), and cerebral infarction without residual deficits: Secondary | ICD-10-CM

## 2020-12-28 NOTE — Telephone Encounter (Signed)
Patient informed that she can take imodium

## 2020-12-31 ENCOUNTER — Other Ambulatory Visit: Payer: Self-pay

## 2020-12-31 ENCOUNTER — Encounter: Payer: Self-pay | Admitting: Cardiology

## 2020-12-31 ENCOUNTER — Ambulatory Visit (INDEPENDENT_AMBULATORY_CARE_PROVIDER_SITE_OTHER): Payer: 59 | Admitting: Cardiology

## 2020-12-31 VITALS — BP 80/50 | HR 48 | Ht 68.0 in | Wt 160.0 lb

## 2020-12-31 DIAGNOSIS — Z1211 Encounter for screening for malignant neoplasm of colon: Secondary | ICD-10-CM

## 2020-12-31 DIAGNOSIS — Z9229 Personal history of other drug therapy: Secondary | ICD-10-CM | POA: Diagnosis not present

## 2020-12-31 DIAGNOSIS — I447 Left bundle-branch block, unspecified: Secondary | ICD-10-CM | POA: Diagnosis not present

## 2020-12-31 DIAGNOSIS — E1165 Type 2 diabetes mellitus with hyperglycemia: Secondary | ICD-10-CM | POA: Diagnosis not present

## 2020-12-31 DIAGNOSIS — I428 Other cardiomyopathies: Secondary | ICD-10-CM | POA: Diagnosis not present

## 2020-12-31 LAB — POCT INR: INR: 3 (ref 2.0–3.0)

## 2020-12-31 MED ORDER — ACCU-CHEK GUIDE CONTROL VI LIQD
0 refills | Status: DC
Start: 2020-12-31 — End: 2020-12-31

## 2020-12-31 MED ORDER — ACCU-CHEK SOFTCLIX LANCETS MISC: 12 refills | Status: DC

## 2020-12-31 MED ORDER — ACCU-CHEK GUIDE VI STRP: ORAL_STRIP | 12 refills | Status: DC

## 2020-12-31 MED ORDER — ACCU-CHEK GUIDE W/DEVICE KIT: PACK | 0 refills | Status: DC

## 2020-12-31 NOTE — Progress Notes (Signed)
Cardiology Office Note:    Date:  12/31/2020   ID:  Breanna Bennett, DOB 02-11-1956, MRN [id number]  PCP:  Blane Ohara, MD  Cardiologist:  Gypsy Balsam, MD    Referring MD: Blane Ohara, MD   Chief Complaint  Patient presents with  . Follow-up  Mylaxen better  History of Present Illness:    Breanna Bennett is a 65 y.o. female with past medical history significant for cardiomyopathy which is nonischemic ejection fraction 2025%, history of CVA, left bundle branch block, dyslipidemia, essential hypertension.  Last time I seen her she can open wound on her legs however that seems to be doing better.  I put her on very small dose of diuretic only 20 mg daily.  Denies have any chest pain tightness squeezing pressure burning chest.  Past Medical History:  Diagnosis Date  . Acute CVA (cerebrovascular accident) (HCC) 05/30/2019  . Acute respiratory failure (HCC)   . Acute systolic CHF (congestive heart failure) (HCC) 01/12/2019  . Cardiac cachexia 07/04/2020  . Chronic systolic CHF (congestive heart failure) (HCC)   . Class 2 severe obesity due to excess calories with serious comorbidity and body mass index (BMI) of 38.0 to 38.9 in adult (HCC) 10/19/2018  . Cough due to bronchospasm 02/16/2019  . Elevated hemoglobin (HCC)   . Elevated uric acid in blood 06/06/2019  . Essential hypertension 01/12/2019  . Gastroesophageal reflux disease 10/19/2018  . GERD (gastroesophageal reflux disease)   . History of Coumadin therapy 06/16/2019  . History of CVA (cerebrovascular accident) 05/30/2019  . History of gout 12/06/2019  . History of hemiplegia 05/30/2019  . Hospital discharge follow-up 06/24/2019  . Hyperlipidemia   . Hypertension   . Hypokalemia 12/06/2019  . Impaired mobility and ADLs 05/30/2019  . Late effect of cerebrovascular accident (CVA) 12/12/2019  . LBBB (left bundle branch block)   . Left ventricular ejection fraction less than 20% 05/30/2019  . Middle cerebral artery embolism, right  05/19/2019  . Mild renal insufficiency   . Mitral regurgitation   . NICM (nonischemic cardiomyopathy) (HCC)   . Nonischemic cardiomyopathy [facility]) cardiac catheterization May 2020 showed normal coronaries, ejection fraction 30% 02/09/2019  . Open wound 1 year ago   Bilateral legs  . Perennial allergic rhinitis 02/16/2019  . Peripheral edema 12/06/2019  . Pre-diabetes   . Stroke (cerebrum) (HCC) - R MCA infarct s/p mechanical thrombectomy 05/19/2019  . Systolic CHF, acute (HCC) 02/09/2019  . Tricuspid regurgitation   . Type 2 diabetes mellitus with hyperglycemia, without long-term current use of insulin (HCC) 05/30/2019  . Type 2 diabetes mellitus, uncontrolled (HCC) 05/27/2019    Past Surgical History:  Procedure Laterality Date  . IR ANGIO VERTEBRAL SEL SUBCLAVIAN INNOMINATE UNI R MOD SED  05/19/2019  . IR CT HEAD LTD  05/19/2019  . IR PERCUTANEOUS ART THROMBECTOMY/INFUSION INTRACRANIAL INC DIAG ANGIO  05/19/2019  . RADIOLOGY WITH ANESTHESIA N/A 05/19/2019   Procedure: IR WITH ANESTHESIA;  Surgeon: Julieanne Cotton, MD;  Location: [facility] OR;  Service: Radiology;  Laterality: N/A;  . RIGHT/LEFT HEART CATH AND CORONARY ANGIOGRAPHY N/A 01/13/2019   Procedure: RIGHT/LEFT HEART CATH AND CORONARY ANGIOGRAPHY;  Surgeon: Runell Gess, MD;  Location: [facility] INVASIVE CV LAB;  Service: Cardiovascular;  Laterality: N/A;  . TUBAL LIGATION      Current Medications: Current Meds  Medication Sig  . allopurinol (ZYLOPRIM) 100 MG tablet Take 100 mg by mouth 2 (two) times daily.  Marland Kitchen atorvastatin (LIPITOR) 10 MG tablet Take 10 mg by mouth daily.  Marland Kitchen  carvedilol (COREG) 3.125 MG tablet Take 1 tablet (3.125 mg total) by mouth 2 (two) times daily with a meal.  . dapagliflozin propanediol (FARXIGA) 5 MG TABS tablet Take 1 tablet (5 mg total) by mouth daily before breakfast.  . famotidine (PEPCID) 40 MG tablet Take 40 mg by mouth at bedtime.   . fluticasone (FLONASE) 50 MCG/ACT nasal spray Place 2 sprays into both  nostrils daily as needed for allergies or rhinitis.  . furosemide (LASIX) 20 MG tablet Take 1 tablet (20 mg total) by mouth 2 (two) times daily.  Marland Kitchen levothyroxine (SYNTHROID) 50 MCG tablet Take 1 tablet (50 mcg total) by mouth daily before breakfast.  . loratadine (CLARITIN) 10 MG tablet Take 1 tablet (10 mg total) by mouth daily as needed for allergies or rhinitis.  . potassium chloride (KLOR-CON) 10 MEQ tablet TAKE 1 TABLET(10 MEQ) BY MOUTH DAILY (Patient taking differently: Take 10 mEq by mouth daily.)  . spironolactone (ALDACTONE) 25 MG tablet TAKE 1/2 TABLET(12.5 MG) BY MOUTH DAILY (Patient taking differently: Take 12.5 mg by mouth once.)  . Vitamin D, Ergocalciferol, (DRISDOL) 1.25 MG (50000 UT) CAPS capsule Take 50,000 Units by mouth every Monday.   . warfarin (COUMADIN) 2.5 MG tablet TAKE 1 TABLET BY MOUTH DAILY EXCEPT 1/2 TABLET ON MONDAY, WEDNESDAY, FRIDAY OR AS DIRECTED (Patient taking differently: Take 2.5 mg by mouth 3 (three) times a week. TAKE 1 TABLET BY MOUTH DAILY EXCEPT 1/2 TABLET ON MONDAY, WEDNESDAY, FRIDAY OR AS DIRECTED)  . [DISCONTINUED] montelukast (SINGULAIR) 10 MG tablet Take 10 mg by mouth at bedtime.     Allergies:   Lisinopril and Losartan   Social History   Socioeconomic History  . Marital status: Unknown    Spouse name: Not on file  . Number of children: Not on file  . Years of education: Not on file  . Highest education level: Not on file  Occupational History  . Not on file  Tobacco Use  . Smoking status: Never Smoker  . Smokeless tobacco: Never Used  Vaping Use  . Vaping Use: Never used  Substance and Sexual Activity  . Alcohol use: Never  . Drug use: Never  . Sexual activity: Not on file  Other Topics Concern  . Not on file  Social History Narrative  . Not on file   Social Determinants of Health   Financial Resource Strain: Not on file  Food Insecurity: Not on file  Transportation Needs: Not on file  Physical Activity: Not on file  Stress:  Not on file  Social Connections: Not on file     Family History: The patient's family history includes CAD in her mother; Cancer in her brother; Colon cancer in her father; Heart failure in her mother; Hypertension in her mother. ROS:   Please see the history of present illness.    All 14 point review of systems negative except as described per history of present illness  EKGs/Labs/Other Studies Reviewed:      Recent Labs: 07/04/2020: NT-Pro BNP 7,165 08/08/2020: BNP 2,850.2; TSH 2.960 12/13/2020: ALT 3; BUN 14; Creatinine, Ser 0.84; Hemoglobin 13.3; Platelets 336; Potassium 4.7; Sodium 137  Recent Lipid Panel    Component Value Date/Time   CHOL 76 (L) 12/13/2020 1129   TRIG 57 12/13/2020 1129   HDL 18 (L) 12/13/2020 1129   CHOLHDL 4.2 12/13/2020 1129   CHOLHDL 6.4 05/20/2019 0441   VLDL 18 05/20/2019 0441   LDLCALC 44 12/13/2020 1129    Physical Exam:  VS:  BP (!) 80/50 (BP Location: Right Arm, Patient Position: Sitting)   Pulse (!) 48   Ht 5\' 8"  (1.727 m)   Wt 160 lb (72.6 kg)   SpO2 90%   BMI 24.33 kg/m     Wt Readings from Last 3 Encounters:  12/31/20 160 lb (72.6 kg)  12/13/20 161 lb (73 kg)  11/30/20 165 lb (74.8 kg)     GEN:  Well nourished, well developed in no acute distress HEENT: Normal NECK: No JVD; No carotid bruits LYMPHATICS: No lymphadenopathy CARDIAC: RRR, no murmurs, no rubs, no gallops RESPIRATORY:  Clear to auscultation without rales, wheezing or rhonchi  ABDOMEN: Soft, non-tender, non-distended MUSCULOSKELETAL:  No edema; No deformity  SKIN: Warm and dry LOWER EXTREMITIES: 1+ leg, some drapes there.  Open wounds which are not dressed but they slowly healing. NEUROLOGIC:  Alert and oriented x 3 PSYCHIATRIC:  Normal affect   ASSESSMENT:    1. NICM (nonischemic cardiomyopathy) (HCC)   2. Nonischemic cardiomyopathy [facility]) cardiac catheterization May 2020 showed normal coronaries, ejection fraction 30%   3. LBBB (left bundle branch block)    4. Uncontrolled type 2 diabetes mellitus with hyperglycemia (HCC)   5. History of Coumadin therapy    PLAN:    In order of problems listed above:  1. Nonischemic cardiomyopathy her blood pressure is very low however she maintained properly she also urinates.  I will continue present management. 2. Hypoalbuminemia.  I did review her Chem-12 which show me low albumin's as well as protein.  I recommended to start using some Glucerna.  I also asked her to talk to her primary care physician about it. 3. Left bundle branch block.  Stable. 4. Type 2 diabetes followed by antimedicine team. 5. Overall she is very sick, her [place] Heart [employer] is class III/IV, she refused ICD BiV pacing.  New problem appears to be hypoalbuminemia and hypoproteinemia.   Medication Adjustments/Labs and Tests Ordered: Current medicines are reviewed at length with the patient today.  Concerns regarding medicines are outlined above.  No orders of the defined types were placed in this encounter.  Medication changes: No orders of the defined types were placed in this encounter.   Signed, June 2020, MD, [facility] 12/31/2020 2:31 PM    [facility]

## 2020-12-31 NOTE — Patient Instructions (Signed)
Medication Instructions:  Your physician recommends that you continue on your current medications as directed. Please refer to the Current Medication list given to you today.  *If you need a refill on your cardiac medications before your next appointment, please call your pharmacy*   Lab Work: Your physician recommends that you return for lab work today: INR If you have labs (blood work) drawn today and your tests are completely normal, you will receive your results only by: Marland Kitchen MyChart Message (if you have MyChart) OR . A paper copy in the mail If you have any lab test that is abnormal or we need to change your treatment, we will call you to review the results.   Testing/Procedures: None   Follow-Up: At [facility], you and your health needs are our priority.  As part of our continuing mission to provide you with exceptional heart care, we have created designated Provider Care Teams.  These Care Teams include your primary Cardiologist (physician) and Advanced Practice Providers (APPs -  Physician Assistants and Nurse Practitioners) who all work together to provide you with the care you need, when you need it.  We recommend signing up for the patient portal called "MyChart".  Sign up information is provided on this After Visit Summary.  MyChart is used to connect with patients for Virtual Visits (Telemedicine).  Patients are able to view lab/test results, encounter notes, upcoming appointments, etc.  Non-urgent messages can be sent to your provider as well.   To learn more about what you can do with MyChart, go to [email or web address].    Your next appointment:   3 month(s)  The format for your next appointment:   In Person  Provider:   Gypsy Balsam, MD   Other Instructions

## 2020-12-31 NOTE — Progress Notes (Signed)
Cologuard due, patient agreed to test.  Cologuard was ordered 12/31/20 thru the portal.

## 2021-01-01 ENCOUNTER — Ambulatory Visit (INDEPENDENT_AMBULATORY_CARE_PROVIDER_SITE_OTHER): Payer: 59 | Admitting: Internal Medicine

## 2021-01-01 DIAGNOSIS — Z5181 Encounter for therapeutic drug level monitoring: Secondary | ICD-10-CM

## 2021-01-01 DIAGNOSIS — Z9229 Personal history of other drug therapy: Secondary | ICD-10-CM

## 2021-01-01 DIAGNOSIS — I639 Cerebral infarction, unspecified: Secondary | ICD-10-CM

## 2021-01-01 LAB — PROTIME-INR
INR: 3 — ABNORMAL HIGH (ref 0.9–1.2)
Prothrombin Time: 29.9 s — ABNORMAL HIGH (ref 9.1–12.0)

## 2021-01-01 NOTE — Patient Instructions (Signed)
Continue taking 1/2 tablet daily except 1 tablet on Sundays, Tuesdays and Thursdays Be consistent with greens Recheck in 6 weeks

## 2021-01-02 ENCOUNTER — Other Ambulatory Visit: Payer: Self-pay

## 2021-01-02 ENCOUNTER — Other Ambulatory Visit
Admission: RE | Admit: 2021-01-02 | Discharge: 2021-01-02 | Disposition: A | Discharge status: Home / Self Care | Payer: 59 | Source: Ambulatory Visit | Attending: Internal Medicine | Admitting: Internal Medicine

## 2021-01-02 ENCOUNTER — Encounter: Payer: 59 | Attending: Internal Medicine | Admitting: Internal Medicine

## 2021-01-02 DIAGNOSIS — I110 Hypertensive heart disease with heart failure: Secondary | ICD-10-CM | POA: Insufficient documentation

## 2021-01-02 DIAGNOSIS — L97818 Non-pressure chronic ulcer of other part of right lower leg with other specified severity: Secondary | ICD-10-CM | POA: Diagnosis not present

## 2021-01-02 DIAGNOSIS — E1151 Type 2 diabetes mellitus with diabetic peripheral angiopathy without gangrene: Secondary | ICD-10-CM | POA: Insufficient documentation

## 2021-01-02 DIAGNOSIS — I890 Lymphedema, not elsewhere classified: Secondary | ICD-10-CM | POA: Insufficient documentation

## 2021-01-02 DIAGNOSIS — B999 Unspecified infectious disease: Secondary | ICD-10-CM | POA: Insufficient documentation

## 2021-01-02 DIAGNOSIS — L97828 Non-pressure chronic ulcer of other part of left lower leg with other specified severity: Secondary | ICD-10-CM | POA: Diagnosis not present

## 2021-01-02 DIAGNOSIS — E11622 Type 2 diabetes mellitus with other skin ulcer: Secondary | ICD-10-CM | POA: Insufficient documentation

## 2021-01-02 DIAGNOSIS — I5022 Chronic systolic (congestive) heart failure: Secondary | ICD-10-CM | POA: Diagnosis not present

## 2021-01-02 DIAGNOSIS — L98492 Non-pressure chronic ulcer of skin of other sites with fat layer exposed: Secondary | ICD-10-CM | POA: Insufficient documentation

## 2021-01-02 DIAGNOSIS — L02211 Cutaneous abscess of abdominal wall: Secondary | ICD-10-CM | POA: Diagnosis not present

## 2021-01-02 DIAGNOSIS — L03311 Cellulitis of abdominal wall: Secondary | ICD-10-CM | POA: Insufficient documentation

## 2021-01-03 ENCOUNTER — Other Ambulatory Visit: Payer: Self-pay | Admitting: Internal Medicine

## 2021-01-03 ENCOUNTER — Ambulatory Visit: Payer: BLUE CROSS/BLUE SHIELD | Admitting: Podiatry

## 2021-01-03 DIAGNOSIS — L02211 Cutaneous abscess of abdominal wall: Secondary | ICD-10-CM

## 2021-01-03 NOTE — Progress Notes (Addendum)
Breanna Bennett, Breanna Bennett ([id number]) Visit Report for 01/02/2021 Arrival Information Details Patient Name: Breanna Bennett, Breanna Bennett Date of Service: 01/02/2021 1:30 PM Medical Record Number: [id number] Patient Account Number: [id number] Date of Birth/Sex: [birth year] (65 y.o. F) Treating RN: Hansel Feinstein Primary Care Shaden Higley: Blane Ohara Other Clinician: Referring Brayon Bielefeld: Blane Ohara Treating Porcia Morganti/Extender: Altamese [place] in Treatment: 12 Visit Information History Since Last Visit Added or deleted any medications: No Patient Arrived: Wheel Chair Had a fall or experienced change in No Arrival Time: 13:27 activities of daily living that may affect Accompanied By: daughter risk of falls: Transfer Assistance: EasyPivot Patient Hospitalized since last visit: No Lift Has Dressing in Place as Prescribed: Yes Patient Identification Verified: Yes Pain Present Now: Yes Secondary Verification Process Completed: Yes Patient Requires Transmission-Based No Precautions: Patient Has Alerts: No Electronic Signature(s) Signed: 01/02/2021 4:50:40 PM By: Hansel Feinstein Entered By: Hansel Feinstein on 01/02/2021 13:32:14 Breanna Bennett ([id number]) -------------------------------------------------------------------------------- Clinic Level of Care Assessment Details Patient Name: Breanna Bennett Date of Service: 01/02/2021 1:30 PM Medical Record Number: [id number] Patient Account Number: [id number] Date of Birth/Sex: 1956-05-03 (65 y.o. F) Treating RN: Huel Coventry Primary Care Faithanne Verret: Blane Ohara Other Clinician: Referring Byanca Kasper: Blane Ohara Treating Seva Chancy/Extender: Altamese  in Treatment: 12 Clinic Level of Care Assessment Items TOOL 4 Quantity Score []  - Use when only an EandM is performed on FOLLOW-UP visit 0 ASSESSMENTS - Nursing Assessment / Reassessment X - Reassessment of Co-morbidities (includes updates in patient status) 1 10 X- 1 5 Reassessment of Adherence to  Treatment Plan ASSESSMENTS - Wound and Skin Assessment / Reassessment []  - Simple Wound Assessment / Reassessment - one wound 0 X- 6 5 Complex Wound Assessment / Reassessment - multiple wounds []  - 0 Dermatologic / Skin Assessment (not related to wound area) ASSESSMENTS - Focused Assessment []  - Circumferential Edema Measurements - multi extremities 0 []  - 0 Nutritional Assessment / Counseling / Intervention []  - 0 Lower Extremity Assessment (monofilament, tuning fork, pulses) []  - 0 Peripheral Arterial Disease Assessment (using hand held doppler) ASSESSMENTS - Ostomy and/or Continence Assessment and Care []  - Incontinence Assessment and Management 0 []  - 0 Ostomy Care Assessment and Management (repouching, etc.) PROCESS - Coordination of Care X - Simple Patient / Family Education for ongoing care 1 15 []  - 0 Complex (extensive) Patient / Family Education for ongoing care X- 1 10 Staff obtains Chiropractor, Records, Test Results / Process Orders []  - 0 Staff telephones HHA, Nursing Homes / Clarify orders / etc []  - 0 Routine Transfer to another Facility (non-emergent condition) []  - 0 Routine Hospital Admission (non-emergent condition) []  - 0 New Admissions / Manufacturing engineer / Ordering NPWT, Apligraf, etc. []  - 0 Emergency Hospital Admission (emergent condition) X- 1 10 Simple Discharge Coordination []  - 0 Complex (extensive) Discharge Coordination PROCESS - Special Needs []  - Pediatric / Minor Patient Management 0 []  - 0 Isolation Patient Management []  - 0 Hearing / Language / Visual special needs []  - 0 Assessment of Community assistance (transportation, D/C planning, etc.) []  - 0 Additional assistance / Altered mentation []  - 0 Support Surface(s) Assessment (bed, cushion, seat, etc.) INTERVENTIONS - Wound Cleansing / Measurement Arciniega, Itali ([id number]) []  - 0 Simple Wound Cleansing - one wound X- 5 5 Complex Wound Cleansing - multiple wounds []  -  0 Wound Imaging (photographs - any number of wounds) X- 1 5 Wound Tracing (instead of photographs) []  - 0 Simple Wound Measurement - one wound X- 5 5  Complex Wound Measurement - multiple wounds INTERVENTIONS - Wound Dressings []  - Small Wound Dressing one or multiple wounds 0 []  - 0 Medium Wound Dressing one or multiple wounds X- 3 20 Large Wound Dressing one or multiple wounds []  - 0 Application of Medications - topical []  - 0 Application of Medications - injection INTERVENTIONS - Miscellaneous []  - External ear exam 0 X- 1 5 Specimen Collection (cultures, biopsies, blood, body fluids, etc.) X- 1 5 Specimen(s) / Culture(s) sent or taken to Lab for analysis []  - 0 Patient Transfer (multiple staff / / Similar devices) []  - 0 Simple Staple / Suture removal (25 or less) []  - 0 Complex Staple / Suture removal (26 or more) []  - 0 Hypo / Hyperglycemic Management (close monitor of Blood Glucose) []  - 0 Ankle / Brachial Index (ABI) - do not check if billed separately X- 1 5 Vital Signs Has the patient been seen at the hospital within the last three years: Yes Total Score: 210 Level Of Care: New/Established - Level 5 Electronic Signature(s) Signed: 01/02/2021 6:06:01 PM By: , BSN, RN, CWS, Kim RN, BSN Entered By: , BSN, RN, CWS, Kim on 01/02/2021 14:24:51 (Nurse, adult) -------------------------------------------------------------------------------- Encounter Discharge Information Details Patient Name: Date of Service: 01/02/2021 1:30 PM Medical Record Number: Patient Account Number: Date of Birth/Sex: 1956/08/04 (65 y.o. F) Treating RN: Elliot Gurney Primary Care Phylisha Dix: 03/04/2021 Other Clinician: Referring Barrie Sigmund: Breanna Bennett Treating Eilish Mcdaniel/Extender: [id number] in Treatment: 22 Encounter Discharge Information Items Discharge Condition: Stable Ambulatory Status:  Ambulatory Discharge Destination: Home Transportation: Private Auto Accompanied By: daughter Schedule Follow-up Appointment: Yes Clinical Summary of Care: Electronic Signature(s) Signed: 01/02/2021 6:06:01 PM By: [id number], BSN, RN, CWS, Kim RN, BSN Entered By: [id number], BSN, RN, CWS, Kim on 01/02/2021 14:25:56 11-18-1990 (Huel Coventry) -------------------------------------------------------------------------------- Lower Extremity Assessment Details Patient Name: Blane Ohara Date of Service: 01/02/2021 1:30 PM Medical Record Number: Altamese [place] Patient Account Number: [id number] Date of Birth/Sex: 08-17-1956 (65 y.o. F) Treating RN: Elliot Gurney Primary Care Lillee Mooneyhan: 03/04/2021 Other Clinician: Referring Jihan Mellette: Breanna Bennett Treating Merril Isakson/Extender: [id number] in Treatment: 12 Edema Assessment Assessed: [Left: Yes] Breanna Bennett: Yes] [Left: Edema] [Right: :] Calf Left: Right: Point of Measurement: 40 cm From Medial Instep 39 cm 39.5 cm Ankle Left: Right: Point of Measurement: 10 cm From Medial Instep 22 cm 22 cm Knee To Floor Left: Right: From Medial Instep 48 cm 48 cm Vascular Assessment Pulses: Dorsalis Pedis Palpable: [Left:Yes] [Right:Yes] Electronic Signature(s) Signed: 01/02/2021 4:50:40 PM By: [id number] Entered By: [id number] on 01/02/2021 13:53:38 [place], 11-18-1990 (Hansel Feinstein) -------------------------------------------------------------------------------- Multi Wound Chart Details Patient Name: Blane Ohara Date of Service: 01/02/2021 1:30 PM Medical Record Number: Altamese [place] Patient Account Number: Franne Forts Date of Birth/Sex: 1955/11/05 (65 y.o. 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 Extremity Extremity Comorbid History: Congestive Heart Failure, Congestive Heart Failure, Congestive Heart Failure, Hypertension, Type II Diabetes Hypertension, Type II Diabetes Hypertension, Type II Diabetes Date Acquired: 08/26/2019  08/26/2019 08/26/2019 Weeks of Treatment: 12 12 12  Wound Status: Open Open Open Measurements L x W x D (cm) 0.1x0.1x0.1 1.5x1.5x0.2 4x5.5x0.1 Area (cm) : 0.008 1.767 17.279 Volume (cm) : 0.001 0.353 1.728 % Reduction in Area: 99.90% 69.10% -129.20% % Reduction in Volume: 100.00% 84.60% 23.60% Classification: Grade 2 Grade 2 Grade 2 Exudate Amount: Medium Medium Large Exudate Type: Serosanguineous Serosanguineous Serous Exudate Color: red, brown red, brown amber Wound Margin: N/A N/A N/A Granulation Amount: None Present (0%) Small (1-33%) Medium (34-66%) Granulation Quality: N/A Red, Pink Pink, Pale Necrotic Amount: None Present (0%) Large (67-100%) Medium (34-66%) Exposed Structures: Fascia: No Fat Layer (Subcutaneous Tissue): Fat Layer (Subcutaneous Tissue): Fat Layer (Subcutaneous Tissue): Yes Yes No Fascia: No Fascia: No Tendon: No Tendon: No Tendon: No Muscle: No Muscle: No Muscle: No Joint: No Joint: No Joint: No Bone: No Bone: No Bone: No Epithelialization: Small (1-33%) None Small (1-33%) Wound Number: 5 6 7  Photos: No Photos Breanna Bennett, Breanna Bennett ([id number]) Wound Location: Right, Medial Lower Leg Left, Medial Lower Leg Left, Posterior Lower Leg Wounding Event: Gradually Appeared Gradually Appeared Gradually Appeared Primary Etiology: Diabetic Wound/Ulcer of the Lower Diabetic  Wound/Ulcer of the Lower Diabetic Wound/Ulcer of the Lower Extremity Extremity Extremity Comorbid History: Congestive Heart Failure, Congestive Heart Failure, Congestive Heart Failure, Hypertension, Type II Diabetes Hypertension, Type II Diabetes Hypertension, Type II Diabetes Date Acquired: 02/07/2020 02/07/2020 02/07/2020 Weeks of Treatment: 12 12 12  Wound Status: Open Open Open Measurements L x W x D (cm) 5x3.5x0.2 1x0.4x0.1 0.1x0.1x0.1 Area (cm) : 13.744 0.314 0.008 Volume (cm) : 2.749 0.031 0.001 % Reduction in Area: 9.10% -60.20% 99.10% % Reduction in Volume: 9.10% 47.50% 99.60% Classification: Grade 2 Grade 2 Grade 2 Exudate Amount: Medium Medium None Present Exudate Type: Serosanguineous Serous N/A Exudate Color: red, brown amber N/A Wound Margin: Flat and Intact N/A N/A Granulation Amount: Medium (34-66%) Medium (34-66%) None Present (0%) Granulation Quality: Red Red, Pink N/A Necrotic Amount: Medium (34-66%) Medium (34-66%) None Present (0%) Exposed Structures: Fat Layer (Subcutaneous Tissue): Fat Layer (Subcutaneous Tissue): N/A Yes Yes Fascia: No Fascia: No Tendon: No Tendon: No Muscle: No Muscle: No Joint: No Joint: No Bone: No Bone: No Epithelialization: Small (1-33%) Small (1-33%) N/A Wound Number: 8 N/A N/A Photos: N/A N/A Wound Location: Medial Abdomen - Lower Quadrant N/A N/A Wounding Event: Gradually Appeared N/A N/A Primary Etiology: Trauma, Other N/A N/A Comorbid History: Congestive Heart Failure, N/A N/A Hypertension, Type II Diabetes Date Acquired: 11/27/2020 N/A N/A Weeks of Treatment: 3 N/A N/A Wound Status: Open N/A N/A Measurements L x W x D (cm) 4x1.6x0.6 N/A N/A Area (cm) : 5.027 N/A N/A Volume (cm) : 3.016 N/A N/A % Reduction in Area: 0.00% N/A N/A % Reduction in Volume: 0.00% N/A N/A Classification: Full Thickness Without Exposed N/A N/A Support Structures Exudate Amount: Medium N/A N/A Exudate Type: Serosanguineous N/A N/A Exudate  Color: red, brown N/A N/A Wound Margin: N/A N/A N/A Granulation Amount: Small (1-33%) N/A N/A Granulation Quality: Red N/A N/A Necrotic Amount: Large (67-100%) N/A N/A Exposed Structures: Fat Layer (Subcutaneous Tissue): N/A N/A Yes Fascia: No Tendon: No Muscle: No Joint: No Bone: No Epithelialization: None N/A N/A Breanna Bennett, Breanna Bennett ([id number]) Treatment Notes Wound #2 (Lower Leg) Wound Laterality: Right Cleanser Peri-Wound Care Topical Primary Dressing Silvercel Small 2x2 (in/in) Discharge Instruction: Apply Silvercel Small 2x2 (in/in) as instructed Secondary Dressing ABD Pad 5x9 (in/in) Discharge Instruction: Cover with ABD pad Kerlix 4.5 x 4.1 (in/yd) Discharge Instruction: Apply Kerlix 4.5 x 4.1 (in/yd) as instructed  Secured With 74M Medipore H Soft Cloth Surgical Tape, 2x2 (in/yd) Discharge Instruction: Secure kerlix Compression Wrap Jobst Farrow Wrap 4000 Discharge Instruction: Secure kerlix with compression wrap Compression Stockings Add-Ons Wound #3 (Lower Leg) Wound Laterality: Right, Distal Cleanser Peri-Wound Care Topical Primary Dressing Silvercel Small 2x2 (in/in) Discharge Instruction: Apply Silvercel Small 2x2 (in/in) as instructed Secondary Dressing ABD Pad 5x9 (in/in) Discharge Instruction: Cover with ABD pad Kerlix 4.5 x 4.1 (in/yd) Discharge Instruction: Apply Kerlix 4.5 x 4.1 (in/yd) as instructed Secured With 74M Medipore H Soft Cloth Surgical Tape, 2x2 (in/yd) Discharge Instruction: Secure kerlix Compression Wrap Jobst Farrow Wrap 4000 Discharge Instruction: Secure kerlix with compression wrap Compression Stockings Add-Ons Wound #4 (Lower Leg) Wound Laterality: Left Cleanser Breanna Bennett, Breanna Bennett ([id number]) Peri-Wound Care Topical Primary Dressing Silvercel Small 2x2 (in/in) Discharge Instruction: Apply Silvercel Small 2x2 (in/in) as instructed Secondary Dressing ABD Pad 5x9 (in/in) Discharge Instruction: Cover with ABD pad Kerlix 4.5  x 4.1 (in/yd) Discharge Instruction: Apply Kerlix 4.5 x 4.1 (in/yd) as instructed Secured With 74M Medipore H Soft Cloth Surgical Tape, 2x2 (in/yd) Discharge Instruction: Secure kerlix Compression Wrap Jobst Farrow Wrap 4000 Discharge Instruction: Secure kerlix with compression wrap Compression Stockings Add-Ons Wound #5 (Lower Leg) Wound Laterality: Right, Medial Cleanser Peri-Wound Care Topical Primary Dressing Silvercel Small 2x2 (in/in) Discharge Instruction: Apply Silvercel Small 2x2 (in/in) as instructed Secondary Dressing ABD Pad 5x9 (in/in) Discharge Instruction: Cover with ABD pad Kerlix 4.5 x 4.1 (in/yd) Discharge Instruction: Apply Kerlix 4.5 x 4.1 (in/yd) as instructed Secured With 74M Medipore H Soft Cloth Surgical Tape, 2x2 (in/yd) Discharge Instruction: Secure kerlix Compression Wrap Jobst Farrow Wrap 4000 Discharge Instruction: Secure kerlix with compression wrap Compression Stockings Add-Ons Wound #6 (Lower Leg) Wound Laterality: Left, Medial Cleanser Peri-Wound Care Topical Primary Dressing Silvercel Small 2x2 (in/in) Breanna Bennett, Breanna Bennett ([id number]) Discharge Instruction: Apply Silvercel Small 2x2 (in/in) as instructed Secondary Dressing ABD Pad 5x9 (in/in) Discharge Instruction: Cover with ABD pad Kerlix 4.5 x 4.1 (in/yd) Discharge Instruction: Apply Kerlix 4.5 x 4.1 (in/yd) as instructed Secured With 74M Medipore H Soft Cloth Surgical Tape, 2x2 (in/yd) Discharge Instruction: Secure kerlix Compression Wrap Jobst Farrow Wrap 4000 Discharge Instruction: Secure kerlix with compression wrap Compression Stockings Add-Ons Wound #7 (Lower Leg) Wound Laterality: Left, Posterior Cleanser Peri-Wound Care Topical Primary Dressing Silvercel Small 2x2 (in/in) Discharge Instruction: Apply Silvercel Small 2x2 (in/in) as instructed Secondary Dressing ABD Pad 5x9 (in/in) Discharge Instruction: Cover with ABD pad Kerlix 4.5 x 4.1 (in/yd) Discharge Instruction:  Apply Kerlix 4.5 x 4.1 (in/yd) as instructed Secured With 74M Medipore H Soft Cloth Surgical Tape, 2x2 (in/yd) Discharge Instruction: Secure kerlix Compression Wrap Jobst Farrow Wrap 4000 Discharge Instruction: Secure kerlix with compression wrap Compression Stockings Add-Ons Wound #8 (Abdomen - Lower Quadrant) Wound Laterality: Medial Cleanser Normal Saline Discharge Instruction: Wash your hands with soap and water. Remove old dressing, discard into plastic bag and place into trash. Cleanse the wound with Normal Saline prior to applying a clean dressing using gauze sponges, not tissues or cotton balls. Do not scrub or use excessive force. Pat dry using gauze sponges, not tissue or cotton balls. Peri-Wound Care Topical Primary Dressing Hydrofera Blue Ready Transfer Foam, 4x5 (in/in) Discharge Instruction: Apply Hydrofera Blue Ready to wound bed as directed-cut to size of wound (extras sent with patient) Breanna Bennett, Breanna Bennett ([id number]) Secondary Dressing Telfa Adhesive [place] Dressing, 4x4 (in/in) Discharge Instruction: Apply over dressing to secure in place. Secured With Compression Wrap Compression Stockings Facilities manager) Signed: 01/02/2021 4:52:48 PM By: Baltazar Najjar MD  Entered By: Baltazar Najjar on 01/02/2021 14:27:52 Breanna Bennett ([id number]) -------------------------------------------------------------------------------- Multi-Disciplinary Care Plan Details Patient Name: AMYRIAH, BURAS Date of Service: 01/02/2021 1:30 PM Medical Record Number: [id number] Patient Account Number: [id number] Date of Birth/Sex: 10/02/1955 (65 y.o. F) Treating RN: Huel Coventry Primary Care Avrie Kedzierski: Blane Ohara Other Clinician: Referring Kaamil Morefield: Blane Ohara Treating Jessamine Barcia/Extender: Altamese [place] in Treatment: 12 Active Inactive Electronic Signature(s) Signed: 01/02/2021 6:06:01 PM By: Elliot Gurney, BSN, RN, CWS, Kim RN, BSN Entered By: Elliot Gurney, BSN, RN, CWS, Kim  on 01/02/2021 14:19:26 Breanna Bennett ([id number]) -------------------------------------------------------------------------------- Pain Assessment Details Patient Name: Breanna Bennett Date of Service: 01/02/2021 1:30 PM Medical Record Number: [id number] Patient Account Number: [id number] Date of Birth/Sex: 1956-02-24 (65 y.o. F) Treating RN: Hansel Feinstein Primary Care Kinnick Maus: Blane Ohara Other Clinician: Referring Torrance Stockley: Blane Ohara Treating Melvenia Favela/Extender: Altamese [place] in Treatment: 12 Active Problems Location of Pain Severity and Description of Pain Patient Has Paino Yes Site Locations Pain Location: Pain in Ulcers Rate the pain. Current Pain Level: 9 Pain Management and Medication Current Pain Management: Electronic Signature(s) Signed: 01/02/2021 4:50:40 PM By: Hansel Feinstein Entered By: Hansel Feinstein on 01/02/2021 13:35:25 Simonich, [name] ([id number]) -------------------------------------------------------------------------------- Wound Assessment Details Patient Name: Breanna Bennett Date of Service: 01/02/2021 1:30 PM Medical Record Number: [id number] Patient Account Number: [id number] Date of Birth/Sex: 05-Oct-1955 (65 y.o. F) Treating RN: Hansel Feinstein Primary Care Villa Burgin: Blane Ohara Other Clinician: Referring Nimco Bivens: Blane Ohara Treating Samantha Olivera/Extender: Altamese [place] in Treatment: 12 Wound Status Wound Number: 2 Primary Etiology: Diabetic Wound/Ulcer of the Lower Extremity Wound Location: Right Lower Leg Wound Status: Open Wounding Event: Gradually Appeared Comorbid Congestive Heart Failure, Hypertension, Type II History: Diabetes Date Acquired: 08/26/2019 Weeks Of Treatment: 12 Clustered Wound: No Wound Measurements Length: (cm) 0.1 Width: (cm) 0.1 Depth: (cm) 0.1 Area: (cm) 0.008 Volume: (cm) 0.001 % Reduction in Area: 99.9% % Reduction in Volume: 100% Epithelialization: Small (1-33%) Tunneling: No Undermining:  No Wound Description Classification: Grade 2 Exudate Amount: Medium Exudate Type: Serosanguineous Exudate Color: red, brown Foul Odor After Cleansing: No Slough/Fibrino Yes Wound Bed Granulation Amount: None Present (0%) Exposed Structure Necrotic Amount: None Present (0%) Fascia Exposed: No Fat Layer (Subcutaneous Tissue) Exposed: No Tendon Exposed: No Muscle Exposed: No Joint Exposed: No Bone Exposed: No Electronic Signature(s) Signed: 01/02/2021 4:50:40 PM By: Hansel Feinstein Entered By: Hansel Feinstein on 01/02/2021 13:44:53 Kever, [name] ([id number]) -------------------------------------------------------------------------------- Wound Assessment Details Patient Name: Breanna Bennett Date of Service: 01/02/2021 1:30 PM Medical Record Number: [id number] Patient Account Number: [id number] Date of Birth/Sex: 1956-04-25 (65 y.o. F) Treating RN: Hansel Feinstein Primary Care Shelbey Spindler: Blane Ohara Other Clinician: Referring Shaaron Golliday: Blane Ohara Treating Luby Seamans/Extender: Altamese  in Treatment: 12 Wound Status Wound Number: 3 Primary Etiology: Diabetic Wound/Ulcer of the Lower Extremity Wound Location: Right, Distal Lower Leg Wound Status: Open Wounding Event: Gradually Appeared Comorbid Congestive Heart Failure, Hypertension, Type II History: Diabetes Date Acquired: 08/26/2019 Weeks Of Treatment: 12 Clustered Wound: No Photos Wound Measurements Length: (cm) 1.5 Width: (cm) 1.5 Depth: (cm) 0.2 Area: (cm) 1.767 Volume: (cm) 0.353 % Reduction in Area: 69.1% % Reduction in Volume: 84.6% Epithelialization: None Tunneling: No Undermining: No Wound Description Classification: Grade 2 Exudate Amount: Medium Exudate Type: Serosanguineous Exudate Color: red, brown Foul Odor After Cleansing: No Slough/Fibrino Yes Wound Bed Granulation Amount: Small (1-33%) Exposed Structure Granulation Quality: Red, Pink Fascia Exposed: No Necrotic Amount: Large  (67-100%) Fat Layer (Subcutaneous Tissue) Exposed: Yes Necrotic Quality: Adherent  Slough Tendon Exposed: No Muscle Exposed: No Joint Exposed: No Bone Exposed: No Electronic Signature(s) Signed: 01/02/2021 4:50:40 PM By: Hansel Feinstein Entered By: Hansel Feinstein on 01/02/2021 13:47:45 Holloway, [name] ([id number]) -------------------------------------------------------------------------------- Wound Assessment Details Patient Name: Breanna Bennett Date of Service: 01/02/2021 1:30 PM Medical Record Number: [id number] Patient Account Number: [id number] Date of Birth/Sex: 1956/05/01 (65 y.o. F) Treating RN: Hansel Feinstein Primary Care Hera Celaya: Blane Ohara Other Clinician: Referring Lucca Greggs: Blane Ohara Treating Christino Mcglinchey/Extender: Altamese [place] in Treatment: 12 Wound Status Wound Number: 4 Primary Etiology: Diabetic Wound/Ulcer of the Lower Extremity Wound Location: Left Lower Leg Wound Status: Open Wounding Event: Gradually Appeared Comorbid Congestive Heart Failure, Hypertension, Type II History: Diabetes Date Acquired: 08/26/2019 Weeks Of Treatment: 12 Clustered Wound: No Photos Wound Measurements Length: (cm) 4 Width: (cm) 5.5 Depth: (cm) 0.1 Area: (cm) 17.279 Volume: (cm) 1.728 % Reduction in Area: -129.2% % Reduction in Volume: 23.6% Epithelialization: Small (1-33%) Tunneling: No Undermining: No Wound Description Classification: Grade 2 Exudate Amount: Large Exudate Type: Serous Exudate Color: amber Foul Odor After Cleansing: No Slough/Fibrino Yes Wound Bed Granulation Amount: Medium (34-66%) Exposed Structure Granulation Quality: Pink, Pale Fascia Exposed: No Necrotic Amount: Medium (34-66%) Fat Layer (Subcutaneous Tissue) Exposed: Yes Necrotic Quality: Adherent Slough Tendon Exposed: No Muscle Exposed: No Joint Exposed: No Bone Exposed: No Electronic Signature(s) Signed: 01/02/2021 4:50:40 PM By: Hansel Feinstein Entered By: Hansel Feinstein on 01/02/2021  13:45:36 [place], [name] ([id number]) -------------------------------------------------------------------------------- Wound Assessment Details Patient Name: Breanna Bennett Date of Service: 01/02/2021 1:30 PM Medical Record Number: [id number] Patient Account Number: [id number] Date of Birth/Sex: November 03, 1955 (65 y.o. F) Treating RN: Hansel Feinstein Primary Care Jamacia Jester: Blane Ohara Other Clinician: Referring Phelix Fudala: Blane Ohara Treating Nomar Broad/Extender: Altamese [place] in Treatment: 12 Wound Status Wound Number: 5 Primary Etiology: Diabetic Wound/Ulcer of the Lower Extremity Wound Location: Right, Medial Lower Leg Wound Status: Open Wounding Event: Gradually Appeared Comorbid Congestive Heart Failure, Hypertension, Type II History: Diabetes Date Acquired: 02/07/2020 Weeks Of Treatment: 12 Clustered Wound: No Photos Wound Measurements Length: (cm) 5 Width: (cm) 3.5 Depth: (cm) 0.2 Area: (cm) 13.744 Volume: (cm) 2.749 % Reduction in Area: 9.1% % Reduction in Volume: 9.1% Epithelialization: Small (1-33%) Tunneling: No Undermining: No Wound Description Classification: Grade 2 Wound Margin: Flat and Intact Exudate Amount: Medium Exudate Type: Serosanguineous Exudate Color: red, brown Foul Odor After Cleansing: No Slough/Fibrino Yes Wound Bed Granulation Amount: Medium (34-66%) Exposed Structure Granulation Quality: Red Fascia Exposed: No Necrotic Amount: Medium (34-66%) Fat Layer (Subcutaneous Tissue) Exposed: Yes Necrotic Quality: Adherent Slough Tendon Exposed: No Muscle Exposed: No Joint Exposed: No Bone Exposed: No Electronic Signature(s) Signed: 01/02/2021 4:50:40 PM By: Hansel Feinstein Entered By: Hansel Feinstein on 01/02/2021 13:47:03 Mill[place], [name] ([id number]) -------------------------------------------------------------------------------- Wound Assessment Details Patient Name: Breanna Bennett Date of Service: 01/02/2021 1:30 PM Medical Record  Number: [id number] Patient Account Number: [id number] Date of Birth/Sex: 1956/07/10 (65 y.o. F) Treating RN: Hansel Feinstein Primary Care Hanah Moultry: Blane Ohara Other Clinician: Referring Daneen Volcy: Blane Ohara Treating Johnnetta Holstine/Extender: [place]ames[employer] [place] in Treatment: 12 Wound Status Wound Number: 6 Primary Etiology: Diabetic Wound/Ulcer of the Lower Extremity Wound Location: Left, Medial Lower Leg Wound Status: Open Wounding Event: Gradually Appeared Comorbid Congestive Heart Failure, Hypertension, Type II History: Diabetes Date Acquired: 02/07/2020 Weeks Of Treatment: 12 Clustered Wound: No Photos Wound Measurements Length: (cm) 1 Width: (cm) 0.4 Depth: (cm) 0.1 Area: (cm) 0.314 Volume: (cm) 0.031 % Reduction in Area: -60.2% % Reduction in Volume: 47.5% Epithelialization: Small (  1-33%) Tunneling: No Undermining: No Wound Description Classification: Grade 2 Exudate Amount: Medium Exudate Type: Serous Exudate Color: amber Foul Odor After Cleansing: No Slough/Fibrino Yes Wound Bed Granulation Amount: Medium (34-66%) Exposed Structure Granulation Quality: Red, Pink Fascia Exposed: No Necrotic Amount: Medium (34-66%) Fat Layer (Subcutaneous Tissue) Exposed: Yes Necrotic Quality: Adherent Slough Tendon Exposed: No Muscle Exposed: No Joint Exposed: No Bone Exposed: No Electronic Signature(s) Signed: 01/02/2021 4:50:40 PM By: Hansel Feinstein Entered By: Hansel Feinstein on 01/02/2021 13:46:19 Monreal, [name] ([id number]) -------------------------------------------------------------------------------- Wound Assessment Details Patient Name: Breanna Bennett Date of Service: 01/02/2021 1:30 PM Medical Record Number: [id number] Patient Account Number: [id number] Date of Birth/Sex: 24-Nov-1955 (65 y.o. F) Treating RN: Hansel Feinstein Primary Care Minnie Legros: Blane Ohara Other Clinician: Referring Mckinley Olheiser: Blane Ohara Treating Skyelar Halliday/Extender: [place] in  Treatment: 12 Wound Status Wound Number: 7 Primary Etiology: Diabetic Wound/Ulcer of the Lower Extremity Wound Location: Left, Posterior Lower Leg Wound Status: Healed - Epithelialized Wounding Event: Gradually Appeared Comorbid Congestive Heart Failure, Hypertension, Type II History: Diabetes Date Acquired: 02/07/2020 Weeks Of Treatment: 12 Clustered Wound: No Wound Measurements Length: (cm) Width: (cm) Depth: (cm) Area: (cm) Volume: (cm) 0 % Reduction in Area: 100% 0 % Reduction in Volume: 100% 0 Tunneling: No 0 Undermining: No 0 Wound Description Classification: Grade 2 Exudate Amount: None Present Foul Odor After Cleansing: No Slough/Fibrino No Wound Bed Granulation Amount: None Present (0%) Necrotic Amount: None Present (0%) Treatment Notes Wound #7 (Lower Leg) Wound Laterality: Left, Posterior Cleanser Peri-Wound Care Topical Primary Dressing Silvercel Small 2x2 (in/in) Discharge Instruction: Apply Silvercel Small 2x2 (in/in) as instructed Secondary Dressing ABD Pad 5x9 (in/in) Discharge Instruction: Cover with ABD pad Kerlix 4.5 x 4.1 (in/yd) Discharge Instruction: Apply Kerlix 4.5 x 4.1 (in/yd) as instructed Secured With 59M Medipore H Soft Cloth Surgical Tape, 2x2 (in/yd) Discharge Instruction: Secure kerlix Compression Wrap Jobst [place] Wrap 4000 Discharge Instruction: Secure kerlix with compression wrap Compression Stockings Add-Ons Breanna Bennett, Breanna Bennett ([id number]) Electronic Signature(s) Signed: 01/22/2021 1:46:03 PM By: Hansel Feinstein Previous Signature: 01/02/2021 4:50:40 PM Version By: Hansel Feinstein Entered By: Hansel Feinstein on 01/09/2021 13:10:24 Breanna Bennett ([id number]) -------------------------------------------------------------------------------- Wound Assessment Details Patient Name: Breanna Bennett Date of Service: 01/02/2021 1:30 PM Medical Record Number: [id number] Patient Account Number: [id number] Date of Birth/Sex: 1955/11/06 (64 y.o.  F) Treating RN: Hansel Feinstein Primary Care Chaneka Trefz: Blane Ohara Other Clinician: Referring Zuly Belkin: Blane Ohara Treating Andres Bantz/Extender: [place] in Treatment: 12 Wound Status Wound Number: 8 Primary Etiology: Trauma, Other Wound Location: Medial Abdomen - Lower Quadrant Wound Status: Open Wounding Event: Gradually Appeared Comorbid Congestive Heart Failure, Hypertension, Type II History: Diabetes Date Acquired: 11/27/2020 Weeks Of Treatment: 3 Clustered Wound: No Photos Wound Measurements Length: (cm) 4 Width: (cm) 1.6 Depth: (cm) 0.6 Area: (cm) 5.027 Volume: (cm) 3.016 % Reduction in Area: 0% % Reduction in Volume: 0% Epithelialization: None Wound Description Classification: Full Thickness Without Exposed Support Structures Exudate Amount: Medium Exudate Type: Serosanguineous Exudate Color: red, brown Foul Odor After Cleansing: No Slough/Fibrino Yes Wound Bed Granulation Amount: Small (1-33%) Exposed Structure Granulation Quality: Red Fascia Exposed: No Necrotic Amount: Large (67-100%) Fat Layer (Subcutaneous Tissue) Exposed: Yes Necrotic Quality: Adherent Slough Tendon Exposed: No Muscle Exposed: No Joint Exposed: No Bone Exposed: No Electronic Signature(s) Signed: 01/02/2021 4:50:40 PM By: Hansel Feinstein Entered By: Hansel Feinstein on 01/02/2021 13:51:37 Breanna Bennett ([id number]) -------------------------------------------------------------------------------- Vitals Details Patient Name: Breanna Bennett Date of Service: 01/02/2021 1:30 PM Medical Record Number: [id number] Patient Account Number: [id number]  Date of Birth/Sex: 15-May-1956 [age] y.o. F) Treating RN: Hansel FeinsteinBishop, Joy Primary Care Ahamed Hofland: Blane Oharaox , Kirsten Other Clinician: Referring Seng Larch: Blane Oharaox , Kirsten Treating Raeonna Milo/Extender: Altamese CarolinaOBSON, MICHAEL G Weeks in Treatment: 12 Vital Signs Time Taken: 13:33 Temperature (F): 97.6 Height (in): 68 Pulse (bpm): 80 Weight (lbs):  160 Respiratory Rate (breaths/min): 18 Body Mass Index (BMI): 24.3 Blood Pressure (mmHg): 93/68 Reference Range: 80 - 120 mg / dl Electronic Signature(s) Signed: 01/02/2021 4:50:40 PM By: Hansel FeinsteinBishop, Joy Entered ByHansel Feinstein: Bishop, Joy on 01/02/2021 13:35:02

## 2021-01-03 NOTE — Progress Notes (Signed)
Breanna Bennett ([id number]) Visit Report for 01/02/2021 HPI Details Patient Name: Breanna Bennett Date of Service: 01/02/2021 1:30 PM Medical Record Number: [id number] Patient Account Number: [id number] Date of Birth/Sex: [birth year] (65 y.o. F) Treating RN: Huel Coventry Primary Care Provider: Blane Ohara Other Clinician: Referring Provider: Blane Ohara Treating Provider/Extender: Altamese Shaw in Treatment: 12 History of Present Illness HPI Description: ADMISSION 10/10/2020 This is a pleasant woman from [facility]. She is here accompanied by her daughter for review of wounds on her bilateral lower legs. Seems like these are chronic and have been present for the most part for about a year. She had compression stockings at one point but I am not sure that she is actually been wearing them. She has about 4 open areas on the left and more substantially for open areas on the right. She is a diabetic but is not on any current treatment. Her most other substantive problem is a severe cardiomyopathy with an ejection fraction less than 20%. Her daughter tells me that she runs a generally low blood pressure and she only takes 10 mg of Lasix with an adjustment if her blood pressure is over a certain value giving it additional 10 mg. It does not seem like she has gained weight recently she says he is at 160 pounds and for her this is actually a reduction. They have been using Medihoney and Neosporin to this area. No recent compression wrap use. There are 4 open areas on the left and 4 open areas on the right. ABIs in our clinic were 1.35 on the right and 1.22 on the left 11/26/2020 on evaluation today patient appears to be doing poorly in regard to her lower extremities. With that being said the biggest issue currently seems to be really that she is having issues with extreme edema of the lower extremities and Dr. Leanord Hawking was concerned about the fact that to be honest the patient  really needs to be seen by cardiology to see if there is anything they can do to help diurese her more as he was concerned about doing too strong of a compression. For that reason we have been doing Kerlix and Coban only for now patient has not seen her cardiologist we have not seen her since February 16 of this year. Unfortunately her wounds appear to be a bit worse than they were previous. 12/03/20 upon evaluation today patient appears to be doing well with regard to her wounds. She has been tolerating the dressing changes without complication. Fortunately there does not appear to be any signs of infection. She still has a tremendous amount of edema but this is being managed by her cardiologist in fact he did put her on some medications yesterday. This included increasing her furosemide to 20 mg 2 times a day. He is also lowering her blood pressure medication to compensate. Obviously this is a very fine balance with this patient. It was actually mentioned in the note that the patient may need to consider palliative care. Obviously were trying to avoid making anything worse while still healing her wounds. 12/11/2020 upon evaluation today patient appears to be doing well with regard to her wounds over the lower extremities. Unfortunately she has a new wound in the abdominal area as well underneath the periumbilical region which unfortunately she did not tell anybody about into I walked into the room. With that being said I think that we need to manage this as well today. She does have again  some good granulation epithelization in regard to leg ulcers I feel like they are doing better. Although I am not really sure exactly what she is doing with the dressing she seems to be putting a lot of Band-Aids on the area. That is not really the ideal thing. 5/11; patient admitted to the clinic in February. She did not really resurface again until early April and Hoyte stones been seeing her since then. She had  wounds on her bilateral lower extremities probably chronic venous with lymphedema. She has been doing reasonably well. However 3 weeks ago she came in the clinic with abdominal wound located in the right lower quadrant.. A culture of them showed Serratia. She is seeing received a course of Omnicef that she finished. She comes in with 2 open wounds 1 in the right mid lower abdominal quadrant a large necrotic wound with some depth and a smaller draining area in the right lower quadrant distally. Both of these look somewhat purulent. She is not complaining of a lot of pain thinks the wounds have been there for about 2 months but really not a lot of specific history here. We have been using silver alginate on the lower extremity wounds under compression and I believe silver alginate on the abdominal wounds Electronic Signature(s) Signed: 01/02/2021 4:52:48 PM By: Baltazar Najjar MD Entered By: Baltazar Najjar on 01/02/2021 14:30:16 Breanna Bennett ([id number]) -------------------------------------------------------------------------------- Physical Exam Details Patient Name: Breanna Bennett Date of Service: 01/02/2021 1:30 PM Medical Record Number: [id number] Patient Account Number: [id number] Date of Birth/Sex: August 28, 1955 (64 y.o. F) Treating RN: Huel Coventry Primary Care Provider: Blane Ohara Other Clinician: Referring Provider: Blane Ohara Treating Provider/Extender: Altamese [place] in Treatment: 12 Constitutional Patient is hypotensive.. Pulse regular and within target range for patient.Marland Kitchen Respirations regular, non-labored and within target range.. Temperature is normal and within the target range for the patient.Marland Kitchen appears in no distress. Gastrointestinal (GI) Appears to be a large abdominal wall mass extending extensively in the right lower quadrant going past the midline. There may be a separate left lower quadrant mass. The skin looks lymphedematous and discolored. Palpable liver  edge just at the right costal margin. Notes Wound exam o The patient's wounds on the anterior lower legs bilaterally look better than the last time I saw these. I think they are making progress edema control is good surface tissue looks at good. o The new area for me today was the abdominal wounds which are necrotic especially the larger 1 superiorly there is also a small draining area inferiorly in the right lower quadrant. I could not prove that these areas connect. I did a culture of the superior 1 is there appeared to be scant amounts of purulent drainage. Surrounding this is what appears to be a large abdominal mass this is nontender firm extending from almost the entirety of the right lower quadrant past the midline into the left there may be a separate area beyond this. Electronic Signature(s) Signed: 01/02/2021 4:52:48 PM By: Baltazar Najjar MD Entered By: Baltazar Najjar on 01/02/2021 14:39:20 Breanna Bennett ([id number]) -------------------------------------------------------------------------------- Physician Orders Details Patient Name: Breanna Bennett Date of Service: 01/02/2021 1:30 PM Medical Record Number: [id number] Patient Account Number: [id number] Date of Birth/Sex: January 26, 1956 (64 y.o. F) Treating RN: Huel Coventry Primary Care Provider: Blane Ohara Other Clinician: Referring Provider: Blane Ohara Treating Provider/Extender: Altamese [place] in Treatment: 6 Verbal / Phone Orders: No Diagnosis Coding Follow-up Appointments o Return Appointment in 1 week. Bathing/ Sport and exercise psychologist  o May shower; gently cleanse wound with antibacterial soap, rinse and pat dry prior to dressing wounds Edema Control - Lymphedema / Segmental Compressive Device / Other Bilateral Lower Extremities o Elevate, Exercise Daily and Avoid Standing for Long Periods of Time. o Elevate legs to the level of the heart and pump ankles as often as possible o Elevate leg(s) parallel to  the floor when sitting. Wound Treatment Wound #2 - Lower Leg Wound Laterality: Right Primary Dressing: Silvercel Small 2x2 (in/in) (Generic) 3 x Per Week/30 Days Discharge Instructions: Apply Silvercel Small 2x2 (in/in) as instructed Secondary Dressing: ABD Pad 5x9 (in/in) 3 x Per Week/30 Days Discharge Instructions: Cover with ABD pad Secondary Dressing: Kerlix 4.5 x 4.1 (in/yd) 3 x Per Week/30 Days Discharge Instructions: Apply Kerlix 4.5 x 4.1 (in/yd) as instructed Secured With: 60M Medipore H Soft Cloth Surgical Tape, 2x2 (in/yd) (Generic) 3 x Per Week/30 Days Discharge Instructions: Secure kerlix Compression Wrap: Jobst Farrow Wrap 4000 3 x Per Week/30 Days Discharge Instructions: Secure kerlix with compression wrap Wound #3 - Lower Leg Wound Laterality: Right, Distal Primary Dressing: Silvercel Small 2x2 (in/in) (Generic) 3 x Per Week/30 Days Discharge Instructions: Apply Silvercel Small 2x2 (in/in) as instructed Secondary Dressing: ABD Pad 5x9 (in/in) 3 x Per Week/30 Days Discharge Instructions: Cover with ABD pad Secondary Dressing: Kerlix 4.5 x 4.1 (in/yd) 3 x Per Week/30 Days Discharge Instructions: Apply Kerlix 4.5 x 4.1 (in/yd) as instructed Secured With: 60M Medipore H Soft Cloth Surgical Tape, 2x2 (in/yd) (Generic) 3 x Per Week/30 Days Discharge Instructions: Secure kerlix Compression Wrap: Jobst Farrow Wrap 4000 3 x Per Week/30 Days Discharge Instructions: Secure kerlix with compression wrap Wound #4 - Lower Leg Wound Laterality: Left Primary Dressing: Silvercel Small 2x2 (in/in) (Generic) 3 x Per Week/30 Days Discharge Instructions: Apply Silvercel Small 2x2 (in/in) as instructed Secondary Dressing: ABD Pad 5x9 (in/in) 3 x Per Week/30 Days Discharge Instructions: Cover with ABD pad Secondary Dressing: Kerlix 4.5 x 4.1 (in/yd) 3 x Per Week/30 Days Discharge Instructions: Apply Kerlix 4.5 x 4.1 (in/yd) as instructed Blaydes, L[place]asha ([id number]) Secured With: 60M Medipore H  Soft Cloth Surgical Tape, 2x2 (in/yd) (Generic) 3 x Per Week/30 Days Discharge Instructions: Secure kerlix Compression Wrap: Jobst Farrow Wrap 4000 3 x Per Week/30 Days Discharge Instructions: Secure kerlix with compression wrap Wound #5 - Lower Leg Wound Laterality: Right, Medial Primary Dressing: Silvercel Small 2x2 (in/in) (Generic) 3 x Per Week/30 Days Discharge Instructions: Apply Silvercel Small 2x2 (in/in) as instructed Secondary Dressing: ABD Pad 5x9 (in/in) 3 x Per Week/30 Days Discharge Instructions: Cover with ABD pad Secondary Dressing: Kerlix 4.5 x 4.1 (in/yd) 3 x Per Week/30 Days Discharge Instructions: Apply Kerlix 4.5 x 4.1 (in/yd) as instructed Secured With: 60M Medipore H Soft Cloth Surgical Tape, 2x2 (in/yd) (Generic) 3 x Per Week/30 Days Discharge Instructions: Secure kerlix Compression Wrap: Jobst Farrow Wrap 4000 3 x Per Week/30 Days Discharge Instructions: Secure kerlix with compression wrap Wound #6 - Lower Leg Wound Laterality: Left, Medial Primary Dressing: Silvercel Small 2x2 (in/in) (Generic) 3 x Per Week/30 Days Discharge Instructions: Apply Silvercel Small 2x2 (in/in) as instructed Secondary Dressing: ABD Pad 5x9 (in/in) 3 x Per Week/30 Days Discharge Instructions: Cover with ABD pad Secondary Dressing: Kerlix 4.5 x 4.1 (in/yd) 3 x Per Week/30 Days Discharge Instructions: Apply Kerlix 4.5 x 4.1 (in/yd) as instructed Secured With: 60M Medipore H Soft Cloth Surgical Tape, 2x2 (in/yd) (Generic) 3 x Per Week/30 Days Discharge Instructions: Secure kerlix Compression Wrap: Jobst Farrow Wrap 4000  3 x Per Week/30 Days Discharge Instructions: Secure kerlix with compression wrap Wound #7 - Lower Leg Wound Laterality: Left, Posterior Primary Dressing: Silvercel Small 2x2 (in/in) (Generic) 3 x Per Week/30 Days Discharge Instructions: Apply Silvercel Small 2x2 (in/in) as instructed Secondary Dressing: ABD Pad 5x9 (in/in) 3 x Per Week/30 Days Discharge Instructions: Cover  with ABD pad Secondary Dressing: Kerlix 4.5 x 4.1 (in/yd) 3 x Per Week/30 Days Discharge Instructions: Apply Kerlix 4.5 x 4.1 (in/yd) as instructed Secured With: 56M Medipore H Soft Cloth Surgical Tape, 2x2 (in/yd) (Generic) 3 x Per Week/30 Days Discharge Instructions: Secure kerlix Compression Wrap: Jobst Farrow Wrap 4000 3 x Per Week/30 Days Discharge Instructions: Secure kerlix with compression wrap Wound #8 - Abdomen - Lower Quadrant Wound Laterality: Medial Cleanser: Normal Saline (Generic) 3 x Per Week/15 Days Discharge Instructions: Wash your hands with soap and water. Remove old dressing, discard into plastic bag and place into trash. Cleanse the wound with Normal Saline prior to applying a clean dressing using gauze sponges, not tissues or cotton balls. Do not scrub or use excessive force. Pat dry using gauze sponges, not tissue or cotton balls. Primary Dressing: Hydrofera Blue Ready Transfer Foam, 4x5 (in/in) 3 x Per Week/15 Days Discharge Instructions: Apply Hydrofera Blue Ready to wound bed as directed-cut to size of wound (extras sent with patient) Secondary Dressing: Telfa Adhesive Island Dressing, 4x4 (in/in) 3 x Per Week/15 Days Avedisian, Jacarra ([id number]) Discharge Instructions: Apply over dressing to secure in place. Laboratory o Bacteria identified in Wound by Culture (MICRO) oooo LOINC Code: [id number] oooo Convenience Name: Wound culture routine Radiology o Computed Tomography (CT) Scan - Abdomen and pelvis (both oral and IV contrast) Electronic Signature(s) Signed: 01/02/2021 4:52:48 PM By: Baltazar Najjar MD Signed: 01/02/2021 6:06:01 PM By: Elliot Gurney, BSN, RN, CWS, Kim RN, BSN Entered By: Elliot Gurney, BSN, RN, CWS, Kim on 01/02/2021 14:29:09 Breanna Bennett, Breanna Bennett ([id number]) -------------------------------------------------------------------------------- Problem List Details Patient Name: Breanna Bennett Date of Service: 01/02/2021 1:30 PM Medical Record Number:  [id number] Patient Account Number: [id number] Date of Birth/Sex: 1955-10-17 (64 y.o. F) Treating RN: Huel Coventry Primary Care Provider: Blane Ohara Other Clinician: Referring Provider: Blane Ohara Treating Provider/Extender: Altamese [place] in Treatment: 12 Active Problems ICD-10 Encounter Code Description Active Date MDM Diagnosis E11.622 Type 2 diabetes mellitus with other skin ulcer 10/10/2020 No Yes I87.323 Chronic venous hypertension (idiopathic) with inflammation of bilateral 10/10/2020 No Yes lower extremity L97.828 Non-pressure chronic ulcer of other part of left lower leg with other 10/10/2020 No Yes specified severity L97.818 Non-pressure chronic ulcer of other part of right lower leg with other 10/10/2020 No Yes specified severity L02.211 Cutaneous abscess of abdominal wall 12/11/2020 No Yes L98.492 Non-pressure chronic ulcer of skin of other sites with fat layer exposed 12/11/2020 No Yes I50.22 Chronic systolic (congestive) heart failure 10/10/2020 No Yes S31.103D Unspecified open wound of abdominal wall, right lower quadrant without 01/02/2021 No Yes penetration into peritoneal cavity, subsequent encounter Inactive Problems Resolved Problems Electronic Signature(s) Signed: 01/02/2021 4:52:48 PM By: Baltazar Najjar MD Entered By: Baltazar Najjar on 01/02/2021 14:27:40 Breanna Bennett ([id number]) -------------------------------------------------------------------------------- Progress Note Details Patient Name: Breanna Bennett Date of Service: 01/02/2021 1:30 PM Medical Record Number: [id number] Patient Account Number: [id number] Date of Birth/Sex: 09/02/1955 (64 y.o. F) Treating RN: Huel Coventry Primary Care Provider: Blane Ohara Other Clinician: Referring Provider: Blane Ohara Treating Provider/Extender: Altamese [place] in Treatment: 12 Subjective History of Present Illness (HPI) ADMISSION 10/10/2020 This is a pleasant woman from [place]  [place]  [place]. She is here accompanied by her daughter for review of wounds on her bilateral lower legs. Seems like these are chronic and have been present for the most part for about a year. She had compression stockings at one point but I am not sure that she is actually been wearing them. She has about 4 open areas on the left and more substantially for open areas on the right. She is a diabetic but is not on any current treatment. Her most other substantive problem is a severe cardiomyopathy with an ejection fraction less than 20%. Her daughter tells me that she runs a generally low blood pressure and she only takes 10 mg of Lasix with an adjustment if her blood pressure is over a certain value giving it additional 10 mg. It does not seem like she has gained weight recently she says he is at 160 pounds and for her this is actually a reduction. They have been using Medihoney and Neosporin to this area. No recent compression wrap use. There are 4 open areas on the left and 4 open areas on the right. ABIs in our clinic were 1.35 on the right and 1.22 on the left 11/26/2020 on evaluation today patient appears to be doing poorly in regard to her lower extremities. With that being said the biggest issue currently seems to be really that she is having issues with extreme edema of the lower extremities and Dr. Leanord Hawking was concerned about the fact that to be honest the patient really needs to be seen by cardiology to see if there is anything they can do to help diurese her more as he was concerned about doing too strong of a compression. For that reason we have been doing Kerlix and Coban only for now patient has not seen her cardiologist we have not seen her since February 16 of this year. Unfortunately her wounds appear to be a bit worse than they were previous. 12/03/20 upon evaluation today patient appears to be doing well with regard to her wounds. She has been tolerating the dressing changes  without complication. Fortunately there does not appear to be any signs of infection. She still has a tremendous amount of edema but this is being managed by her cardiologist in fact he did put her on some medications yesterday. This included increasing her furosemide to 20 mg 2 times a day. He is also lowering her blood pressure medication to compensate. Obviously this is a very fine balance with this patient. It was actually mentioned in the note that the patient may need to consider palliative care. Obviously were trying to avoid making anything worse while still healing her wounds. 12/11/2020 upon evaluation today patient appears to be doing well with regard to her wounds over the lower extremities. Unfortunately she has a new wound in the abdominal area as well underneath the periumbilical region which unfortunately she did not tell anybody about into I walked into the room. With that being said I think that we need to manage this as well today. She does have again some good granulation epithelization in regard to leg ulcers I feel like they are doing better. Although I am not really sure exactly what she is doing with the dressing she seems to be putting a lot of Band-Aids on the area. That is not really the ideal thing. 5/11; patient admitted to the clinic in February. She did not really resurface again until early April and Hoyte stones been seeing her since then.  She had wounds on her bilateral lower extremities probably chronic venous with lymphedema. She has been doing reasonably well. However 3 weeks ago she came in the clinic with abdominal wound located in the right lower quadrant.. A culture of them showed Serratia. She is seeing received a course of Omnicef that she finished. She comes in with 2 open wounds 1 in the right mid lower abdominal quadrant a large necrotic wound with some depth and a smaller draining area in the right lower quadrant distally. Both of these look somewhat  purulent. She is not complaining of a lot of pain thinks the wounds have been there for about 2 months but really not a lot of specific history here. We have been using silver alginate on the lower extremity wounds under compression and I believe silver alginate on the abdominal wounds Objective Constitutional Patient is hypotensive.. Pulse regular and within target range for patient.Marland Kitchen. Respirations regular, non-labored and within target range.. Temperature is normal and within the target range for the patient.Marland Kitchen. appears in no distress. Vitals Time Taken: 1:33 PM, Height: 68 in, Weight: 160 lbs, BMI: 24.3, Temperature: 97.6 F, Pulse: 80 bpm, Respiratory Rate: 18 breaths/min, Blood Pressure: 93/68 mmHg. Gastrointestinal (GI) Appears to be a large abdominal wall mass extending extensively in the right lower quadrant going past the midline. There may be a separate left lower quadrant mass. The skin looks lymphedematous and discolored. Palpable liver edge just at the right costal margin. Breanna CruiseCASSIDY, Veverly ([id number]) General Notes: Wound exam The patient's wounds on the anterior lower legs bilaterally look better than the last time I saw these. I think they are making progress edema control is good surface tissue looks at good. The new area for me today was the abdominal wounds which are necrotic especially the larger 1 superiorly there is also a small draining area inferiorly in the right lower quadrant. I could not prove that these areas connect. I did a culture of the superior 1 is there appeared to be scant amounts of purulent drainage. Surrounding this is what appears to be a large abdominal mass this is nontender firm extending from almost the entirety of the right lower quadrant past the midline into the left there may be a separate area beyond this. Integumentary (Hair, Skin) Wound #2 status is Open. Original cause of wound was Gradually Appeared. The date acquired was: 08/26/2019. The wound has  been in treatment 12 weeks. The wound is located on the Right Lower Leg. The wound measures 0.1cm length x 0.1cm width x 0.1cm depth; 0.008cm^2 area and 0.001cm^3 volume. There is no tunneling or undermining noted. There is a medium amount of serosanguineous drainage noted. There is no granulation within the wound bed. There is no necrotic tissue within the wound bed. Wound #3 status is Open. Original cause of wound was Gradually Appeared. The date acquired was: 08/26/2019. The wound has been in treatment 12 weeks. The wound is located on the Right,Distal Lower Leg. The wound measures 1.5cm length x 1.5cm width x 0.2cm depth; 1.767cm^2 area and 0.353cm^3 volume. There is Fat Layer (Subcutaneous Tissue) exposed. There is no tunneling or undermining noted. There is a medium amount of serosanguineous drainage noted. There is small (1-33%) red, pink granulation within the wound bed. There is a large (67-100%) amount of necrotic tissue within the wound bed including Adherent Slough. Wound #4 status is Open. Original cause of wound was Gradually Appeared. The date acquired was: 08/26/2019. The wound has been in treatment 12 weeks. The  wound is located on the Left Lower Leg. The wound measures 4cm length x 5.5cm width x 0.1cm depth; 17.279cm^2 area and 1.728cm^3 volume. There is Fat Layer (Subcutaneous Tissue) exposed. There is no tunneling or undermining noted. There is a large amount of serous drainage noted. There is medium (34-66%) pink, pale granulation within the wound bed. There is a medium (34-66%) amount of necrotic tissue within the wound bed including Adherent Slough. Wound #5 status is Open. Original cause of wound was Gradually Appeared. The date acquired was: 02/07/2020. The wound has been in treatment 12 weeks. The wound is located on the Right,Medial Lower Leg. The wound measures 5cm length x 3.5cm width x 0.2cm depth; 13.744cm^2 area and 2.749cm^3 volume. There is Fat Layer (Subcutaneous Tissue)  exposed. There is no tunneling or undermining noted. There is a medium amount of serosanguineous drainage noted. The wound margin is flat and intact. There is medium (34-66%) red granulation within the wound bed. There is a medium (34-66%) amount of necrotic tissue within the wound bed including Adherent Slough. Wound #6 status is Open. Original cause of wound was Gradually Appeared. The date acquired was: 02/07/2020. The wound has been in treatment 12 weeks. The wound is located on the Left,Medial Lower Leg. The wound measures 1cm length x 0.4cm width x 0.1cm depth; 0.314cm^2 area and 0.031cm^3 volume. There is Fat Layer (Subcutaneous Tissue) exposed. There is no tunneling or undermining noted. There is a medium amount of serous drainage noted. There is medium (34-66%) red, pink granulation within the wound bed. There is a medium (34-66%) amount of necrotic tissue within the wound bed including Adherent Slough. Wound #7 status is Open. Original cause of wound was Gradually Appeared. The date acquired was: 02/07/2020. The wound has been in treatment 12 weeks. The wound is located on the Left,Posterior Lower Leg. The wound measures 0.1cm length x 0.1cm width x 0.1cm depth; 0.008cm^2 area and 0.001cm^3 volume. There is no tunneling or undermining noted. There is a none present amount of drainage noted. There is no granulation within the wound bed. There is no necrotic tissue within the wound bed. Wound #8 status is Open. Original cause of wound was Gradually Appeared. The date acquired was: 11/27/2020. The wound has been in treatment 3 weeks. The wound is located on the Medial Abdomen - Lower Quadrant. The wound measures 4cm length x 1.6cm width x 0.6cm depth; 5.027cm^2 area and 3.016cm^3 volume. There is Fat Layer (Subcutaneous Tissue) exposed. There is a medium amount of serosanguineous drainage noted. There is small (1-33%) red granulation within the wound bed. There is a large (67-100%) amount of  necrotic tissue within the wound bed including Adherent Slough. 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 Bathing/ Shower/ Hygiene: May shower; gently cleanse wound with antibacterial soap, rinse and pat dry prior to dressing wounds Mckinnon, Ahana ([id number]) Edema Control - Lymphedema / Segmental Compressive Device / Other: Elevate, Exercise Daily and Avoid Standing for Long Periods of Time. Elevate legs  to the level of the heart and pump ankles as often as possible Elevate leg(s) parallel to the floor when sitting. Laboratory ordered were: Wound culture routine Radiology ordered were: Computed Tomography (CT) Scan - Abdomen and pelvis (both oral and IV contrast) WOUND #2: - Lower Leg Wound Laterality: Right Primary Dressing: Silvercel Small 2x2 (in/in) (Generic) 3 x Per Week/30 Days Discharge Instructions: Apply Silvercel Small 2x2 (in/in) as instructed Secondary Dressing: ABD Pad 5x9 (in/in) 3 x Per Week/30 Days Discharge Instructions: Cover with ABD pad Secondary Dressing: Kerlix 4.5 x 4.1 (in/yd) 3 x Per Week/30 Days Discharge Instructions: Apply Kerlix 4.5 x 4.1 (in/yd) as instructed Secured With: 24M Medipore H Soft Cloth Surgical Tape, 2x2 (in/yd) (Generic) 3 x Per Week/30 Days Discharge Instructions: Secure kerlix Compression Wrap:  Jobst Farrow Wrap 4000 3 x Per Week/30 Days Discharge Instructions: Secure kerlix with compression wrap WOUND #3: - Lower Leg Wound Laterality: Right, Distal Primary Dressing: Silvercel Small 2x2 (in/in) (Generic) 3 x Per Week/30 Days Discharge Instructions: Apply Silvercel Small 2x2 (in/in) as instructed Secondary Dressing: ABD Pad 5x9 (in/in) 3 x Per Week/30 Days Discharge Instructions: Cover with ABD pad Secondary Dressing: Kerlix 4.5 x 4.1 (in/yd) 3 x Per Week/30 Days Discharge Instructions: Apply Kerlix 4.5 x 4.1 (in/yd) as instructed Secured With: 24M Medipore H Soft Cloth Surgical Tape, 2x2 (in/yd) (Generic) 3 x Per Week/30 Days Discharge Instructions: Secure kerlix Compression Wrap: Jobst Farrow Wrap 4000 3 x Per Week/30 Days Discharge Instructions: Secure kerlix with compression wrap WOUND #4: - Lower Leg Wound Laterality: Left Primary Dressing: Silvercel Small 2x2 (in/in) (Generic) 3 x Per Week/30 Days Discharge Instructions: Apply Silvercel Small 2x2 (in/in) as instructed Secondary Dressing: ABD Pad 5x9 (in/in) 3 x Per Week/30 Days Discharge Instructions: Cover with ABD pad Secondary Dressing: Kerlix 4.5 x 4.1 (in/yd) 3 x Per Week/30 Days Discharge Instructions: Apply Kerlix 4.5 x 4.1 (in/yd) as instructed Secured With: 24M Medipore H Soft Cloth Surgical Tape, 2x2 (in/yd) (Generic) 3 x Per Week/30 Days Discharge Instructions: Secure kerlix Compression Wrap: Jobst Farrow Wrap 4000 3 x Per Week/30 Days Discharge Instructions: Secure kerlix with compression wrap WOUND #5: - Lower Leg Wound Laterality: Right, Medial Primary Dressing: Silvercel Small 2x2 (in/in) (Generic) 3 x Per Week/30 Days Discharge Instructions: Apply Silvercel Small 2x2 (in/in) as instructed Secondary Dressing: ABD Pad 5x9 (in/in) 3 x Per Week/30 Days Discharge Instructions: Cover with ABD pad Secondary Dressing: Kerlix 4.5 x 4.1 (in/yd) 3 x Per Week/30 Days Discharge Instructions: Apply Kerlix 4.5 x 4.1  (in/yd) as instructed Secured With: 24M Medipore H Soft Cloth Surgical Tape, 2x2 (in/yd) (Generic) 3 x Per Week/30 Days Discharge Instructions: Secure kerlix Compression Wrap: Jobst Farrow Wrap 4000 3 x Per Week/30 Days Discharge Instructions: Secure kerlix with compression wrap WOUND #6: - Lower Leg Wound Laterality: Left, Medial Primary Dressing: Silvercel Small 2x2 (in/in) (Generic) 3 x Per Week/30 Days Discharge Instructions: Apply Silvercel Small 2x2 (in/in) as instructed Secondary Dressing: ABD Pad 5x9 (in/in) 3 x Per Week/30 Days Discharge Instructions: Cover with ABD pad Secondary Dressing: Kerlix 4.5 x 4.1 (in/yd) 3 x Per Week/30 Days Discharge Instructions: Apply Kerlix 4.5 x 4.1 (in/yd) as instructed Secured With: 24M Medipore H Soft Cloth Surgical Tape, 2x2 (in/yd) (Generic) 3 x Per Week/30 Days Discharge Instructions: Secure kerlix Compression Wrap: Jobst Farrow Wrap 4000 3 x Per Week/30 Days Discharge Instructions: Secure kerlix with compression wrap WOUND #7: - Lower Leg Wound Laterality: Left, Posterior Primary Dressing: Silvercel Small 2x2 (in/in) (Generic) 3  x Per Week/30 Days Discharge Instructions: Apply Silvercel Small 2x2 (in/in) as instructed Secondary Dressing: ABD Pad 5x9 (in/in) 3 x Per Week/30 Days Discharge Instructions: Cover with ABD pad Secondary Dressing: Kerlix 4.5 x 4.1 (in/yd) 3 x Per Week/30 Days Discharge Instructions: Apply Kerlix 4.5 x 4.1 (in/yd) as instructed Secured With: 59M Medipore H Soft Cloth Surgical Tape, 2x2 (in/yd) (Generic) 3 x Per Week/30 Days Discharge Instructions: Secure kerlix Compression Wrap: Jobst Farrow Wrap 4000 3 x Per Week/30 Days Discharge Instructions: Secure kerlix with compression wrap WOUND #8: - Abdomen - Lower Quadrant Wound Laterality: Medial Cleanser: Normal Saline (Generic) 3 x Per Week/15 Days Discharge Instructions: Wash your hands with soap and water. Remove old dressing, discard into plastic bag and place into  trash. Cleanse the wound with Normal Saline prior to applying a clean dressing using gauze sponges, not tissues or cotton balls. Do not scrub or use excessive force. Pat dry using gauze sponges, not tissue or cotton balls. Primary Dressing: Hydrofera Blue Ready Transfer Foam, 4x5 (in/in) 3 x Per Week/15 Days Howze, Levy ([id number]) Discharge Instructions: Apply Hydrofera Blue Ready to wound bed as directed-cut to size of wound (extras sent with patient) Secondary Dressing: Telfa Adhesive [place] Dressing, 4x4 (in/in) 3 x Per Week/15 Days Discharge Instructions: Apply over dressing to secure in place. 1. Silver alginate to all wound areas/ABDs and Farrow wraps 2. It would appear that this woman has an abdominal mass. I looked through her records briefly she had a CT scan of the abdomen from an outside hospital question [facility] in October 2021 although that was a CTA and I do not know that it was looking at what I am interested in. Wounds on her abdomen are necrotic surrounding this mass 3. Have done another culture of the wound on her abdomen but I did not give her empiric antibiotics she received a course of Omnicef for Serratia on 4/19 4. All look through the records in epic to see if I can find any reference to the right lower quadrant predominantly abdominal findings but this not being obvious I ordered a CT of the abdomen and pelvis with oral and IV contrast. I wonder if this is peritoneal metastasis Electronic Signature(s) Signed: 01/02/2021 4:52:48 PM By: Baltazar Najjar MD Entered By: Baltazar Najjar on 01/02/2021 14:41:32 Breanna Bennett ([id number]) -------------------------------------------------------------------------------- SuperBill Details Patient Name: Breanna Bennett Date of Service: 01/02/2021 Medical Record Number: [id number] Patient Account Number: [id number] Date of Birth/Sex: 20-Jun-1956 (64 y.o. F) Treating RN: Huel Coventry Primary Care Provider: Blane Ohara Other  Clinician: Referring Provider: Blane Ohara Treating Provider/Extender: Altamese [place] in Treatment: 12 Diagnosis Coding ICD-10 Codes Code Description E11.622 Type 2 diabetes mellitus with other skin ulcer I87.323 Chronic venous hypertension (idiopathic) with inflammation of bilateral lower extremity L97.828 Non-pressure chronic ulcer of other part of left lower leg with other specified severity L97.818 Non-pressure chronic ulcer of other part of right lower leg with other specified severity L02.211 Cutaneous abscess of abdominal wall L98.492 Non-pressure chronic ulcer of skin of other sites with fat layer exposed I50.22 Chronic systolic (congestive) heart failure Unspecified open wound of abdominal wall, right lower quadrant without penetration into peritoneal cavity, subsequent S31.103D encounter Facility Procedures CPT4 Code: [id number] Description: 63875 - WOUND CARE VISIT-LEV 5 EST PT Modifier: Quantity: 1 Physician Procedures CPT4: Description Modifier Quantity Code [id number] - WC PHYS LEVEL 4 - EST PT 1 CPT4: ICD-10 Diagnosis Description L97.828 Non-pressure chronic ulcer of other part of left lower leg with  other specified severity L97.818 Non-pressure chronic ulcer of other part of right lower leg with other specified severity S31.103D Unspecified  open wound of abdominal wall, right lower quadrant without penetration into peritoneal cavity, subsequent encounter Electronic Signature(s) Signed: 01/02/2021 4:52:48 PM By: Baltazar Najjar MD Entered By: Baltazar Najjar on 01/02/2021 14:42:05

## 2021-01-06 LAB — AEROBIC CULTURE W GRAM STAIN (SUPERFICIAL SPECIMEN)

## 2021-01-09 ENCOUNTER — Encounter: Payer: 59 | Admitting: Internal Medicine

## 2021-01-09 ENCOUNTER — Other Ambulatory Visit: Payer: Self-pay

## 2021-01-09 DIAGNOSIS — E11622 Type 2 diabetes mellitus with other skin ulcer: Secondary | ICD-10-CM | POA: Diagnosis not present

## 2021-01-10 ENCOUNTER — Ambulatory Visit: Payer: 59 | Admitting: Cardiology

## 2021-01-10 ENCOUNTER — Ambulatory Visit (INDEPENDENT_AMBULATORY_CARE_PROVIDER_SITE_OTHER): Payer: 59 | Admitting: Podiatry

## 2021-01-10 DIAGNOSIS — Z5329 Procedure and treatment not carried out because of patient's decision for other reasons: Secondary | ICD-10-CM

## 2021-01-10 NOTE — Progress Notes (Signed)
No show for appt. 

## 2021-01-10 NOTE — Progress Notes (Signed)
DAYSIE, HELF ([id number]) Visit Report for 01/09/2021 Debridement Details Patient Name: Breanna Bennett Date of Service: 01/09/2021 1:30 PM Medical Record Number: [id number] Patient Account Number: [id number] Date of Birth/Sex: 06/07/1956 (65 y.o. F) Treating RN: Huel Coventry Primary Care Provider: Blane Ohara Other Clinician: Referring Provider: Blane Ohara Treating Provider/Extender: Altamese [place] in Treatment: 13 Debridement Performed for Wound #8 Medial Abdomen - Lower Quadrant Assessment: Performed By: Physician Maxwell Caul, MD Debridement Type: Chemical/Enzymatic/Mechanical Agent Used: saline and gauze Level of Consciousness (Pre- Awake and Alert procedure): Pre-procedure Verification/Time Out Yes - 15:01 Taken: Start Time: 15:01 Pain Control: Lidocaine Instrument: Other : saline and gauze Bleeding: None Response to Treatment: Procedure was not tolerated well Comments: Patient in a lot of pain, had to stop. Level of Consciousness (Post- Awake and Alert procedure): Post Debridement Measurements of Total Wound Length: (cm) 4 Width: (cm) 2.5 Depth: (cm) 1.5 Volume: (cm) 11.781 Character of Wound/Ulcer Post Debridement: Stable Post Procedure Diagnosis Same as Pre-procedure Electronic Signature(s) Signed: 01/09/2021 3:02:55 PM By: Elliot Gurney, BSN, RN, CWS, Kim RN, BSN Signed: 01/09/2021 5:17:14 PM By: Baltazar Najjar MD Entered By: Elliot Gurney, BSN, RN, CWS, Kim on 01/09/2021 15:02:55 Breanna Bennett ([id number]) -------------------------------------------------------------------------------- HPI Details Patient Name: Breanna Bennett Date of Service: 01/09/2021 1:30 PM Medical Record Number: [id number] Patient Account Number: [id number] Date of Birth/Sex: 08-24-1956 (65 y.o. F) Treating RN: Rogers Blocker Primary Care Provider: Blane Ohara Other Clinician: Referring Provider: Blane Ohara Treating Provider/Extender: Altamese [place] in  Treatment: 13 History of Present Illness HPI Description: ADMISSION 10/10/2020 This is a pleasant woman from [facility]. She is here accompanied by her daughter for review of wounds on her bilateral lower legs. Seems like these are chronic and have been present for the most part for about a year. She had compression stockings at one point but I am not sure that she is actually been wearing them. She has about 4 open areas on the left and more substantially for open areas on the right. She is a diabetic but is not on any current treatment. Her most other substantive problem is a severe cardiomyopathy with an ejection fraction less than 20%. Her daughter tells me that she runs a generally low blood pressure and she only takes 10 mg of Lasix with an adjustment if her blood pressure is over a certain value giving it additional 10 mg. It does not seem like she has gained weight recently she says he is at 160 pounds and for her this is actually a reduction. They have been using Medihoney and Neosporin to this area. No recent compression wrap use. There are 4 open areas on the left and 4 open areas on the right. ABIs in our clinic were 1.35 on the right and 1.22 on the left 11/26/2020 on evaluation today patient appears to be doing poorly in regard to her lower extremities. With that being said the biggest issue currently seems to be really that she is having issues with extreme edema of the lower extremities and Dr. Leanord Hawking was concerned about the fact that to be honest the patient really needs to be seen by cardiology to see if there is anything they can do to help diurese her more as he was concerned about doing too strong of a compression. For that reason we have been doing Kerlix and Coban only for now patient has not seen her cardiologist we have not seen her since February 16 of this year. Unfortunately her wounds  appear to be a bit worse than they were previous. 12/03/20 upon evaluation  today patient appears to be doing well with regard to her wounds. She has been tolerating the dressing changes without complication. Fortunately there does not appear to be any signs of infection. She still has a tremendous amount of edema but this is being managed by her cardiologist in fact he did put her on some medications yesterday. This included increasing her furosemide to 20 mg 2 times a day. He is also lowering her blood pressure medication to compensate. Obviously this is a very fine balance with this patient. It was actually mentioned in the note that the patient may need to consider palliative care. Obviously were trying to avoid making anything worse while still healing her wounds. 12/11/2020 upon evaluation today patient appears to be doing well with regard to her wounds over the lower extremities. Unfortunately she has a new wound in the abdominal area as well underneath the periumbilical region which unfortunately she did not tell anybody about into I walked into the room. With that being said I think that we need to manage this as well today. She does have again some good granulation epithelization in regard to leg ulcers I feel like they are doing better. Although I am not really sure exactly what she is doing with the dressing she seems to be putting a lot of Band-Aids on the area. That is not really the ideal thing. 5/11; patient admitted to the clinic in February. She did not really resurface again until early April and Hoyte stones been seeing her since then. She had wounds on her bilateral lower extremities probably chronic venous with lymphedema. She has been doing reasonably well. However 3 weeks ago she came in the clinic with abdominal wound located in the right lower quadrant.. A culture of them showed Serratia. She is seeing received a course of Omnicef that she finished. She comes in with 2 open wounds 1 in the right mid lower abdominal quadrant a large necrotic wound with  some depth and a smaller draining area in the right lower quadrant distally. Both of these look somewhat purulent. She is not complaining of a lot of pain thinks the wounds have been there for about 2 months but really not a lot of specific history here. We have been using silver alginate on the lower extremity wounds under compression and I believe silver alginate on the abdominal wounds 5/18; this is a patient I saw for the first time last week. She has wounds on her bilateral lower legs which look like chronic venous wounds with lymphedema. They have been doing reasonably well with silver collagen under the patient's own Farrow wraps. It came out today that she is really not allowing for family help with putting dressings on and as result she came in with very little today I talked to her about this In the last 2 weeks we have discovered that a large abdominal mass. She has a CT scan on the 25th which should tell us the nature of this although I am concerned about an underlying malignancy. She also has a necrotic wound malodorous. Culture I did last week showed Enterobacter cloacae I and Pseudomonas. Prescribing antibiotics for her complicated by the fact that she is on Coumadin with an INR of 3 on 5/9 Electronic Signature(s) Signed: 01/09/2021 5:17:14 PM By: Baltazar Najjar MD Entered By: Baltazar Najjar on 01/09/2021 14:31:01 Breanna Bennett ([id number]) -------------------------------------------------------------------------------- Physical Exam Details Patient Name: Breanna Bennett Date  of Service: 01/09/2021 1:30 PM Medical Record Number: [id number] Patient Account Number: [id number] Date of Birth/Sex: Jun 08, 1956 (64 y.o. F) Treating RN: Rogers BlockerSanchez, Kenia Primary Care Provider: Blane Oharaox , Kirsten Other Clinician: Referring Provider: Blane Oharaox , Kirsten Treating Provider/Extender: Altamese CarolinaOBSON, Reed Eifert G Weeks in Treatment: 13 Constitutional Patient is hypotensive. However she does not appear to be unwell.  Pulse regular and within target range for patient.Marland Kitchen. Respirations regular, non- labored and within target range.. Temperature is normal and within the target range for the patient.Marland Kitchen. appears in no distress. Cardiovascular Pedal pulses palpable and strong bilaterally.. Edema present in both extremities. Suggestive skin changes of chronic venous insufficiency. Gastrointestinal (GI) Large abdominal mass not particularly tender. I did not palpate a liver edge. Notes Wound exam o The patient's wounds on the left anterior legs bilaterally actually look quite good. These look as though they are epithelializing. She does not have good edema control apparently is not using her Farrow wraps and I do not know if she is really addressing the wounds o I am concerned about the abdominal wound which is necrotic deep. The exact history here is not really clear. There is no overt soft tissue infection Electronic Signature(s) Signed: 01/09/2021 5:17:14 PM By: Baltazar Najjarobson, Deveron Shamoon MD Entered By: Baltazar Najjarobson, Anchor Dwan on 01/09/2021 14:33:08 Breanna Bennett, Breanna Bennett ([id number]) -------------------------------------------------------------------------------- Physician Orders Details Patient Name: Breanna Bennett, Breanna Bennett Date of Service: 01/09/2021 1:30 PM Medical Record Number: [id number] Patient Account Number: [id number] Date of Birth/Sex: Jun 08, 1956 (64 y.o. F) Treating RN: Huel CoventryWoody, Kim Primary Care Provider: Blane Oharaox , Kirsten Other Clinician: Referring Provider: Blane Oharaox , Kirsten Treating Provider/Extender: Altamese CarolinaOBSON, Jory Tanguma G Weeks in Treatment: 2413 Verbal / Phone Orders: No Diagnosis Coding Follow-up Appointments o Return Appointment in 1 week. Bathing/ Shower/ Hygiene o May shower; gently cleanse wound with antibacterial soap, rinse and pat dry prior to dressing wounds Edema Control - Lymphedema / Segmental Compressive Device / Other Bilateral Lower Extremities o Elevate, Exercise Daily and Avoid Standing for Long Periods of  Time. o Elevate legs to the level of the heart and pump ankles as often as possible o Elevate leg(s) parallel to the floor when sitting. Wound Treatment Wound #3 - Lower Leg Wound Laterality: Right, Distal Primary Dressing: Prisma 4.34 (in) 3 x Per Week/30 Days Discharge Instructions: Moisten w/normal saline or sterile water; Cover wound as directed. Do not remove from wound bed. Secondary Dressing: ABD Pad 5x9 (in/in) (DME) (Generic) 3 x Per Week/30 Days Discharge Instructions: Cover with ABD pad Secondary Dressing: Kerlix 4.5 x 4.1 (in/yd) (DME) (Generic) 3 x Per Week/30 Days Discharge Instructions: Apply Kerlix 4.5 x 4.1 (in/yd) as instructed Secured With: 2M Medipore H Soft Cloth Surgical Tape, 2x2 (in/yd) (DME) (Generic) 3 x Per Week/30 Days Discharge Instructions: Secure kerlix Compression Wrap: Jobst Farrow Wrap 4000 3 x Per Week/30 Days Discharge Instructions: Secure kerlix with compression wrap Wound #4 - Lower Leg Wound Laterality: Left Primary Dressing: Prisma 4.34 (in) 3 x Per Week/30 Days Discharge Instructions: Moisten w/normal saline or sterile water; Cover wound as directed. Do not remove from wound bed. Secondary Dressing: ABD Pad 5x9 (in/in) (DME) (Generic) 3 x Per Week/30 Days Discharge Instructions: Cover with ABD pad Secondary Dressing: Kerlix 4.5 x 4.1 (in/yd) (DME) (Generic) 3 x Per Week/30 Days Discharge Instructions: Apply Kerlix 4.5 x 4.1 (in/yd) as instructed Secured With: 2M Medipore H Soft Cloth Surgical Tape, 2x2 (in/yd) (DME) (Generic) 3 x Per Week/30 Days Discharge Instructions: Secure kerlix Compression Wrap: Jobst Farrow Wrap 4000 3 x Per Week/30 Days Discharge Instructions: Secure kerlix  with compression wrap Wound #5 - Lower Leg Wound Laterality: Right, Medial Primary Dressing: Prisma 4.34 (in) 3 x Per Week/30 Days Discharge Instructions: Moisten w/normal saline or sterile water; Cover wound as directed. Do not remove from wound bed. Secondary  Dressing: ABD Pad 5x9 (in/in) (DME) (Generic) 3 x Per Week/30 Days Discharge Instructions: Cover with ABD pad Secondary Dressing: Kerlix 4.5 x 4.1 (in/yd) (DME) (Generic) 3 x Per Week/30 Days Discharge Instructions: Apply Kerlix 4.5 x 4.1 (in/yd) as instructed Breanna Bennett, Breanna Bennett ([id number]) Secured With: 36M Medipore H Soft Cloth Surgical Tape, 2x2 (in/yd) (DME) (Generic) 3 x Per Week/30 Days Discharge Instructions: Secure kerlix Compression Wrap: Jobst Farrow Wrap 4000 3 x Per Week/30 Days Discharge Instructions: Secure kerlix with compression wrap Wound #6 - Lower Leg Wound Laterality: Left, Medial Primary Dressing: Prisma 4.34 (in) 3 x Per Week/30 Days Discharge Instructions: Moisten w/normal saline or sterile water; Cover wound as directed. Do not remove from wound bed. Secondary Dressing: ABD Pad 5x9 (in/in) (DME) (Generic) 3 x Per Week/30 Days Discharge Instructions: Cover with ABD pad Secondary Dressing: Kerlix 4.5 x 4.1 (in/yd) (DME) (Generic) 3 x Per Week/30 Days Discharge Instructions: Apply Kerlix 4.5 x 4.1 (in/yd) as instructed Secured With: 36M Medipore H Soft Cloth Surgical Tape, 2x2 (in/yd) (DME) (Generic) 3 x Per Week/30 Days Discharge Instructions: Secure kerlix Compression Wrap: Jobst Farrow Wrap 4000 3 x Per Week/30 Days Discharge Instructions: Secure kerlix with compression wrap Wound #8 - Abdomen - Lower Quadrant Wound Laterality: Medial Topical: Gentamicin 1 x Per Day/30 Days Discharge Instructions: Apply as directed by provider. Primary Dressing: Silvercel Small 2x2 (in/in) (DME) (Generic) 1 x Per Day/30 Days Discharge Instructions: Apply Silvercel Small 2x2 (in/in) as instructed Secondary Dressing: ABD Pad 5x9 (in/in) (DME) (Generic) 1 x Per Day/30 Days Discharge Instructions: Cover with ABD pad Secured With: under garment (Generic) 1 x Per Day/30 Days Discharge Instructions: secure in place with undergarment Laboratory o PT panel in platelet poor plasma by  coagulation assay (COAG) - Patient taking antibiotics for wound infection. Please check INR on Monday and Friday until antibiotics are complete. [facility] [phone] Fax [phone] oooo LOINC Code: [phone] oooo Convenience Name: Protime and INR panel Notes Monday check blood INR ([facility] [phone]) Electronic Signature(s) Signed: 01/09/2021 4:51:59 PM By: Elliot Gurney, BSN, RN, CWS, Kim RN, BSN Signed: 01/09/2021 5:17:14 PM By: Baltazar Najjar MD Entered By: Elliot Gurney, BSN, RN, CWS, Kim on 01/09/2021 16:23:05 Breanna Bennett, Breanna Bennett ([id number]) -------------------------------------------------------------------------------- Prescription 01/09/2021 Patient Name: Breanna Bennett Provider: Maxwell Caul MD Date of Birth: 12/07/1955 NPI#: [id number] Sex: F DEA#: [id number] Phone #: [phone] License #: [id number] Patient Address: [facility] [address] [facility] [place], [place] [address] [address], [address] [place], [place] [address] [phone] Allergies No Known Drug Allergies Provider's Orders o PT panel in platelet poor plasma by coagulation assay - Patient taking antibiotics for wound infection. Please check INR on Monday and Friday until antibiotics are complete. [facility] [phone] Fax [phone] oooo LOINC Code: [phone] oooo Convenience Name: Protime and INR panel Hand Signature: Date(s): Electronic Signature(s) Signed: 01/09/2021 4:51:59 PM By: Elliot Gurney, BSN, RN, CWS, Kim RN, BSN Signed: 01/09/2021 5:17:14 PM By: Baltazar Najjar MD Entered By: Elliot Gurney, BSN, RN, CWS, Kim on 01/09/2021 16:23:05 LYNDZEE, SEIGAL ([id number]) --------------------------------------------------------------------------------  Problem List Details Patient Name: Breanna Bennett Date of Service: 01/09/2021 1:30 PM Medical Record Number: [id number] Patient Account Number: [id number] Date of Birth/Sex:  1955-11-06 (64 y.o. F) Treating RN:  Rogers Blocker Primary Care Provider: Blane Ohara Other Clinician: Referring Provider: Blane Ohara Treating Provider/Extender: Altamese [place] in Treatment: 21 Active Problems ICD-10 Encounter Code Description Active Date MDM Diagnosis E11.622 Type 2 diabetes mellitus with other skin ulcer 10/10/2020 No Yes I87.323 Chronic venous hypertension (idiopathic) with inflammation of bilateral 10/10/2020 No Yes lower extremity L97.828 Non-pressure chronic ulcer of other part of left lower leg with other 10/10/2020 No Yes specified severity L97.818 Non-pressure chronic ulcer of other part of right lower leg with other 10/10/2020 No Yes specified severity L02.211 Cutaneous abscess of abdominal wall 12/11/2020 No Yes L98.492 Non-pressure chronic ulcer of skin of other sites with fat layer exposed 12/11/2020 No Yes S31.103D Unspecified open wound of abdominal wall, right lower quadrant without 01/02/2021 No Yes penetration into peritoneal cavity, subsequent encounter Inactive Problems ICD-10 Code Description Active Date Inactive Date I50.22 Chronic systolic (congestive) heart failure 10/10/2020 10/10/2020 Resolved Problems Electronic Signature(s) Signed: 01/09/2021 5:17:14 PM By: Baltazar Najjar MD Entered By: Baltazar Najjar on 01/09/2021 14:29:08 Breanna Bennett ([id number]) -------------------------------------------------------------------------------- Progress Note Details Patient Name: Breanna Bennett Date of Service: 01/09/2021 1:30 PM Medical Record Number: [id number] Patient Account Number: [id number] Date of Birth/Sex: 18-Jun-1956 (64 y.o. F) Treating RN: Rogers Blocker Primary Care Provider: Blane Ohara Other Clinician: Referring Provider: Blane Ohara Treating Provider/Extender: Altamese [place] in Treatment: 13 Subjective History of Present Illness (HPI) ADMISSION 10/10/2020 This is a pleasant woman from [facility]. She is here accompanied by her daughter for review of wounds on her bilateral lower legs. Seems like these are chronic and have been present for the most part for about a year. She had compression stockings at one point but I am not sure that she is actually been wearing them. She has about 4 open areas on the left and more substantially for open areas on the right. She is a diabetic but is not on any current treatment. Her most other substantive problem is a severe cardiomyopathy with an ejection fraction less than 20%. Her daughter tells me that she runs a generally low blood pressure and she only takes 10 mg of Lasix with an adjustment if her blood pressure is over a certain value giving it additional 10 mg. It does not seem like she has gained weight recently she says he is at 160 pounds and for her this is actually a reduction. They have been using Medihoney and Neosporin to this area. No recent compression wrap use. There are 4 open areas on the left and 4 open areas on the right. ABIs in our clinic were 1.35 on the right and 1.22 on the left 11/26/2020 on evaluation today patient appears to be doing poorly in regard to her lower extremities. With that being said the biggest issue currently seems to be really that she is having issues with extreme edema of the lower extremities and Dr. Leanord Hawking was concerned about the fact that to be honest the patient really needs to be seen by cardiology to see if there is anything they can do to help diurese her more as he was concerned about doing too strong of a compression. For that reason we have been doing Kerlix and Coban only for now patient has not seen her cardiologist we have not seen her since February 16 of this year. Unfortunately her wounds appear to be a bit worse than they were previous. 12/03/20 upon evaluation today patient appears to be doing well with regard to her wounds.  She has been tolerating the dressing changes  without complication. Fortunately there does not appear to be any signs of infection. She still has a tremendous amount of edema but this is being managed by her cardiologist in fact he did put her on some medications yesterday. This included increasing her furosemide to 20 mg 2 times a day. He is also lowering her blood pressure medication to compensate. Obviously this is a very fine balance with this patient. It was actually mentioned in the note that the patient may need to consider palliative care. Obviously were trying to avoid making anything worse while still healing her wounds. 12/11/2020 upon evaluation today patient appears to be doing well with regard to her wounds over the lower extremities. Unfortunately she has a new wound in the abdominal area as well underneath the periumbilical region which unfortunately she did not tell anybody about into I walked into the room. With that being said I think that we need to manage this as well today. She does have again some good granulation epithelization in regard to leg ulcers I feel like they are doing better. Although I am not really sure exactly what she is doing with the dressing she seems to be putting a lot of Band-Aids on the area. That is not really the ideal thing. 5/11; patient admitted to the clinic in February. She did not really resurface again until early April and Hoyte stones been seeing her since then. She had wounds on her bilateral lower extremities probably chronic venous with lymphedema. She has been doing reasonably well. However 3 weeks ago she came in the clinic with abdominal wound located in the right lower quadrant.. A culture of them showed Serratia. She is seeing received a course of Omnicef that she finished. She comes in with 2 open wounds 1 in the right mid lower abdominal quadrant a large necrotic wound with some depth and a smaller draining area in the right lower quadrant distally. Both of these look somewhat  purulent. She is not complaining of a lot of pain thinks the wounds have been there for about 2 months but really not a lot of specific history here. We have been using silver alginate on the lower extremity wounds under compression and I believe silver alginate on the abdominal wounds 5/18; this is a patient I saw for the first time last week. She has wounds on her bilateral lower legs which look like chronic venous wounds with lymphedema. They have been doing reasonably well with silver collagen under the patient's own Farrow wraps. It came out today that she is really not allowing for family help with putting dressings on and as result she came in with very little today I talked to her about this In the last 2 weeks we have discovered that a large abdominal mass. She has a CT scan on the 25th which should tell us the nature of this although I am concerned about an underlying malignancy. She also has a necrotic wound malodorous. Culture I did last week showed Enterobacter cloacae I and Pseudomonas. Prescribing antibiotics for her complicated by the fact that she is on Coumadin with an INR of 3 on 5/9 Objective Constitutional Patient is hypotensive. However she does not appear to be unwell. Pulse regular and within target range for patient.Marland Kitchen Respirations regular, non- labored and within target range.. Temperature is normal and within the target range for the patient.Marland Kitchen appears in no distress. Vitals Time Taken: 1:31 PM, Height: 68 in, Weight: 160  lbs, BMI: 24.3, Temperature: 97.5 F, Pulse: 89 bpm, Respiratory Rate: 18 breaths/min, Breanna Bennett, Breanna Bennett ([id number]) Blood Pressure: 94/66 mmHg. Cardiovascular Pedal pulses palpable and strong bilaterally.. Edema present in both extremities. Suggestive skin changes of chronic venous insufficiency. Gastrointestinal (GI) Large abdominal mass not particularly tender. I did not palpate a liver edge. General Notes: Wound exam The patient's wounds on the left  anterior legs bilaterally actually look quite good. These look as though they are epithelializing. She does not have good edema control apparently is not using her Farrow wraps and I do not know if she is really addressing the wounds I am concerned about the abdominal wound which is necrotic deep. The exact history here is not really clear. There is no overt soft tissue infection Integumentary (Hair, Skin) Wound #2 status is Healed - Epithelialized. Original cause of wound was Gradually Appeared. The date acquired was: 08/26/2019. The wound has been in treatment 13 weeks. The wound is located on the Right Lower Leg. The wound measures 0cm length x 0cm width x 0cm depth; 0cm^2 area and 0cm^3 volume. Wound #3 status is Open. Original cause of wound was Gradually Appeared. The date acquired was: 08/26/2019. The wound has been in treatment 13 weeks. The wound is located on the Right,Distal Lower Leg. The wound measures 1.7cm length x 1.5cm width x 0.2cm depth; 2.003cm^2 area and 0.401cm^3 volume. There is Fat Layer (Subcutaneous Tissue) exposed. There is a medium amount of serosanguineous drainage noted. There is medium (34-66%) red, pink granulation within the wound bed. There is a medium (34-66%) amount of necrotic tissue within the wound bed including Adherent Slough. Wound #4 status is Open. Original cause of wound was Gradually Appeared. The date acquired was: 08/26/2019. The wound has been in treatment 13 weeks. The wound is located on the Left Lower Leg. The wound measures 4.3cm length x 5.5cm width x 0.1cm depth; 18.575cm^2 area and 1.857cm^3 volume. There is Fat Layer (Subcutaneous Tissue) exposed. There is a large amount of serous drainage noted. There is small (1-33%) red, pink granulation within the wound bed. There is a large (67-100%) amount of necrotic tissue within the wound bed including Adherent Slough. Wound #5 status is Open. Original cause of wound was Gradually Appeared. The date  acquired was: 02/07/2020. The wound has been in treatment 13 weeks. The wound is located on the Right,Medial Lower Leg. The wound measures 3cm length x 2.5cm width x 0.2cm depth; 5.89cm^2 area and 1.178cm^3 volume. There is Fat Layer (Subcutaneous Tissue) exposed. There is a medium amount of serosanguineous drainage noted. The wound margin is flat and intact. There is medium (34-66%) red, pink granulation within the wound bed. There is a medium (34-66%) amount of necrotic tissue within the wound bed including Adherent Slough. Wound #6 status is Open. Original cause of wound was Gradually Appeared. The date acquired was: 02/07/2020. The wound has been in treatment 13 weeks. The wound is located on the Left,Medial Lower Leg. The wound measures 1cm length x 1cm width x 0.1cm depth; 0.785cm^2 area and 0.079cm^3 volume. There is Fat Layer (Subcutaneous Tissue) exposed. There is a medium amount of serous drainage noted. There is medium (34-66%) red, pink granulation within the wound bed. There is a medium (34-66%) amount of necrotic tissue within the wound bed including Adherent Slough. Wound #8 status is Open. Original cause of wound was Gradually Appeared. The date acquired was: 11/27/2020. The wound has been in treatment 4 weeks. The wound is located on the Medial Abdomen -  Lower Quadrant. The wound measures 4cm length x 2.5cm width x 1.5cm depth; 7.854cm^2 area and 11.781cm^3 volume. There is Fat Layer (Subcutaneous Tissue) exposed. There is no tunneling or undermining noted. There is a large amount of serosanguineous drainage noted. There is small (1-33%) red granulation within the wound bed. There is a large (67-100%) amount of necrotic tissue within the wound bed including Adherent Slough. 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 Bathing/ Shower/ Hygiene: May shower; gently cleanse wound with antibacterial soap, rinse and pat dry prior to dressing wounds Breanna Bennett, Breanna Bennett ([id number]) Edema Control - Lymphedema / Segmental Compressive Device / Other: Elevate, Exercise Daily and Avoid Standing for Long Periods of Time. Elevate legs to the level of the heart and pump ankles as often as possible Elevate leg(s) parallel to the floor when sitting. General Notes: Monday check blood INR ([facility] [phone]) WOUND #3: - Lower Leg Wound Laterality: Right, Distal Primary Dressing: Silvercel Small 2x2 (in/in) (DME) (Generic) 3 x Per Week/30 Days Discharge Instructions: Apply Silvercel Small 2x2 (in/in) as instructed Secondary Dressing: ABD Pad 5x9 (in/in) (DME) (Generic) 3 x Per Week/30 Days Discharge Instructions: Cover with ABD pad Secondary Dressing: Kerlix 4.5 x 4.1 (in/yd) (DME) (Generic) 3 x Per Week/30 Days Discharge Instructions: Apply Kerlix 4.5 x 4.1 (in/yd) as instructed Secured With: 77M Medipore H Soft Cloth Surgical Tape, 2x2 (in/yd) (DME) (Generic) 3 x Per Week/30 Days Discharge Instructions: Secure kerlix Compression Wrap: Jobst Farrow Wrap 4000 3 x Per Week/30 Days Discharge Instructions: Secure kerlix with compression wrap WOUND #4: - Lower Leg Wound Laterality: Left Primary Dressing: Silvercel Small 2x2 (in/in) (DME) (Generic) 3 x Per Week/30 Days Discharge Instructions: Apply Silvercel Small 2x2 (in/in) as instructed Secondary Dressing: ABD Pad 5x9 (in/in) (DME)  (Generic) 3 x Per Week/30 Days Discharge Instructions: Cover with ABD pad Secondary Dressing: Kerlix 4.5 x 4.1 (in/yd) (DME) (Generic) 3 x Per Week/30 Days Discharge Instructions: Apply Kerlix 4.5 x 4.1 (in/yd) as instructed Secured With: 77M Medipore H Soft Cloth Surgical Tape, 2x2 (in/yd) (DME) (Generic) 3 x Per Week/30 Days Discharge Instructions: Secure kerlix Compression Wrap: Jobst Farrow Wrap 4000 3 x Per Week/30 Days Discharge Instructions: Secure kerlix with compression wrap WOUND #5: - Lower Leg Wound Laterality: Right, Medial Primary Dressing: Silvercel Small 2x2 (in/in) (Generic) 3 x Per Week/30 Days Discharge Instructions: Apply Silvercel Small 2x2 (in/in) as instructed Secondary Dressing: ABD Pad 5x9 (in/in) 3 x Per Week/30 Days Discharge Instructions: Cover with ABD pad Secondary Dressing: Kerlix 4.5 x 4.1 (in/yd) (DME) (Generic) 3 x Per Week/30 Days Discharge Instructions: Apply Kerlix 4.5 x 4.1 (in/yd) as instructed Secured With: 77M Medipore H Soft Cloth Surgical Tape, 2x2 (in/yd) (DME) (Generic) 3 x Per Week/30 Days Discharge Instructions: Secure kerlix Compression Wrap: Jobst Farrow Wrap 4000 3 x Per Week/30 Days Discharge Instructions: Secure kerlix with compression wrap WOUND #6: - Lower Leg Wound Laterality: Left, Medial Primary Dressing: Silvercel Small 2x2 (in/in) (DME) (Generic) 3 x Per Week/30 Days Discharge Instructions: Apply Silvercel Small  2x2 (in/in) as instructed Secondary Dressing: ABD Pad 5x9 (in/in) (DME) (Generic) 3 x Per Week/30 Days Discharge Instructions: Cover with ABD pad Secondary Dressing: Kerlix 4.5 x 4.1 (in/yd) (DME) (Generic) 3 x Per Week/30 Days Discharge Instructions: Apply Kerlix 4.5 x 4.1 (in/yd) as instructed Secured With: 53M Medipore H Soft Cloth Surgical Tape, 2x2 (in/yd) (DME) (Generic) 3 x Per Week/30 Days Discharge Instructions: Secure kerlix Compression Wrap: Jobst Farrow Wrap 4000 3 x Per Week/30 Days Discharge Instructions:  Secure kerlix with compression wrap WOUND #8: - Abdomen - Lower Quadrant Wound Laterality: Medial Cleanser: Normal Saline (DME) (Generic) 1 x Per Day/30 Days Discharge Instructions: Wash your hands with soap and water. Remove old dressing, discard into plastic bag and place into trash. Cleanse the wound with Normal Saline prior to applying a clean dressing using gauze sponges, not tissues or cotton balls. Do not scrub or use excessive force. Pat dry using gauze sponges, not tissue or cotton balls. Topical: Gentamicin 1 x Per Day/30 Days Discharge Instructions: Apply as directed by provider. Primary Dressing: Silvercel Small 2x2 (in/in) (DME) (Generic) 1 x Per Day/30 Days Discharge Instructions: Apply Silvercel Small 2x2 (in/in) as instructed Secondary Dressing: ABD Pad 5x9 (in/in) (DME) (Generic) 1 x Per Day/30 Days Discharge Instructions: Cover with ABD pad Secured With: under garment (Generic) 1 x Per Day/30 Days 1. I am going to prescribe topical gentamicin with silver alginate to the abdominal wounds. 2. I am going to address the culture with cefdinir 300 twice daily for 7 days 3 CT scan of the abdomen on May 25. I am concerned that this is a bulky tumor 4. The wounds on her legs I think her chronic venous related. She does not appear to be allowing family to help her and I talked about this today. Nevertheless the wounds on her legs do not look ominous. Electronic Signature(s) Signed: 01/09/2021 5:17:14 PM By: Baltazar Najjar MD Entered By: Baltazar Najjar on 01/09/2021 14:34:08 Breanna Bennett ([id number]) Breanna Bennett, Breanna Bennett ([id number]) -------------------------------------------------------------------------------- SuperBill Details Patient Name: Breanna Bennett Date of Service: 01/09/2021 Medical Record Number: [id number] Patient Account Number: [id number] Date of Birth/Sex: 22-Jun-1956 (64 y.o. F) Treating RN: Huel Coventry Primary Care Provider: Blane Ohara Other  Clinician: Referring Provider: Blane Ohara Treating Provider/Extender: [place] in Treatment: 13 Diagnosis Coding ICD-10 Codes Code Description E11.622 Type 2 diabetes mellitus with other skin ulcer I87.323 Chronic venous hypertension (idiopathic) with inflammation of bilateral lower extremity L97.828 Non-pressure chronic ulcer of other part of left lower leg with other specified severity L97.818 Non-pressure chronic ulcer of other part of right lower leg with other specified severity L02.211 Cutaneous abscess of abdominal wall L98.492 Non-pressure chronic ulcer of skin of other sites with fat layer exposed I50.22 Chronic systolic (congestive) heart failure Unspecified open wound of abdominal wall, right lower quadrant without penetration into peritoneal cavity, subsequent S31.103D encounter Facility Procedures CPT4 Code: 08657846 Description: 96295 - WOUND CARE VISIT-LEV 5 EST PT Modifier: Quantity: 1 Physician Procedures CPT4: Description Modifier Quantity Code [id number] - WC PHYS LEVEL 4 - EST PT 1 CPT4: ICD-10 Diagnosis Description L97.828 Non-pressure chronic ulcer of other part of left lower leg with other specified severity L97.818 Non-pressure chronic ulcer of other part of right lower leg with other specified severity S31.103D Unspecified  open wound of abdominal wall, right lower quadrant without penetration into peritoneal cavity, subsequent encounter Electronic Signature(s) Signed: 01/09/2021 5:17:14 PM By: Baltazar Najjar MD Entered By: Baltazar Najjar on 01/09/2021 14:34:51

## 2021-01-14 NOTE — Progress Notes (Signed)
SUMIE, REMSEN ([id number]) Visit Report for 01/09/2021 Arrival Information Details Patient Name: Breanna Bennett, Breanna Bennett Date of Service: 01/09/2021 1:30 PM Medical Record Number: [id number] Patient Account Number: [id number] Date of Birth/Sex: 10-14-1955 (65 y.o. F) Treating RN: Hansel Feinstein Primary Care Bakari Nikolai: Blane Ohara Other Clinician: Referring Adler Alton: Blane Ohara Treating Tyshan Enderle/Extender: Altamese [place] in Treatment: 13 Visit Information History Since Last Visit Added or deleted any medications: No Patient Arrived: Wheel Chair Had a fall or experienced change in No Arrival Time: 13:29 activities of daily living that may affect Accompanied By: daughter risk of falls: Transfer Assistance: EasyPivot Patient Lift Hospitalized since last visit: No Patient Identification Verified: Yes Has Dressing in Place as Prescribed: Yes Secondary Verification Process Completed: Yes Pain Present Now: Yes Patient Requires Transmission-Based No Precautions: Patient Has Alerts: Yes Patient Alerts: Patient on Blood Thinner DIABETIC Electronic Signature(s) Signed: 01/10/2021 8:26:01 AM By: Hansel Feinstein Entered By: Hansel Feinstein on 01/09/2021 13:32:38 Breanna Bennett ([id number]) -------------------------------------------------------------------------------- Clinic Level of Care Assessment Details Patient Name: Breanna Bennett Date of Service: 01/09/2021 1:30 PM Medical Record Number: [id number] Patient Account Number: [id number] Date of Birth/Sex: 1956/01/15 (64 y.o. F) Treating RN: Huel Coventry Primary Care Woodie Trusty: Blane Ohara Other Clinician: Referring Jia Dottavio: Blane Ohara Treating Geselle Cardosa/Extender: Altamese [place] in Treatment: 13 Clinic Level of Care Assessment Items TOOL 2 Quantity Score []  - Use when only an EandM is performed on the INITIAL visit 0 ASSESSMENTS - Nursing Assessment / Reassessment X - General Physical Exam (combine w/ comprehensive assessment  (listed just below) when performed on new 1 20 pt. evals) X- 1 25 Comprehensive Assessment (HX, ROS, Risk Assessments, Wounds Hx, etc.) ASSESSMENTS - Wound and Skin Assessment / Reassessment []  - Simple Wound Assessment / Reassessment - one wound 0 X- 6 5 Complex Wound Assessment / Reassessment - multiple wounds []  - 0 Dermatologic / Skin Assessment (not related to wound area) ASSESSMENTS - Ostomy and/or Continence Assessment and Care []  - Incontinence Assessment and Management 0 []  - 0 Ostomy Care Assessment and Management (repouching, etc.) PROCESS - Coordination of Care []  - Simple Patient / Family Education for ongoing care 0 X- 1 20 Complex (extensive) Patient / Family Education for ongoing care []  - 0 Staff obtains , Records, Test Results / Process Orders []  - 0 Staff telephones HHA, Nursing Homes / Clarify orders / etc []  - 0 Routine Transfer to another Facility (non-emergent condition) []  - 0 Routine Hospital Admission (non-emergent condition) []  - 0 New Admissions / / Ordering NPWT, Apligraf, etc. []  - 0 Emergency Hospital Admission (emergent condition) []  - 0 Simple Discharge Coordination X- 1 15 Complex (extensive) Discharge Coordination PROCESS - Special Needs []  - Pediatric / Minor Patient Management 0 []  - 0 Isolation Patient Management []  - 0 Hearing / Language / Visual special needs []  - 0 Assessment of Community assistance (transportation, D/C planning, etc.) []  - 0 Additional assistance / Altered mentation []  - 0 Support Surface(s) Assessment (bed, cushion, seat, etc.) INTERVENTIONS - Wound Cleansing / Measurement X - Wound Imaging (photographs - any number of wounds) 1 5 []  - 0 Wound Tracing (instead of photographs) []  - 0 Simple Wound Measurement - one wound X- 6 5 Complex Wound Measurement - multiple wounds Wessler, Karlyn ( ) []  - 0 Simple Wound Cleansing - one wound X- 6 5 Complex Wound  Cleansing - multiple wounds INTERVENTIONS - Wound Dressings []  - Small Wound Dressing one or multiple wounds 0 X- 1 15 Medium  Wound Dressing one or multiple wounds X- 3 20 Large Wound Dressing one or multiple wounds []  - 0 Application of Medications - injection INTERVENTIONS - Miscellaneous []  - External ear exam 0 []  - 0 Specimen Collection (cultures, biopsies, blood, body fluids, etc.) []  - 0 Specimen(s) / Culture(s) sent or taken to Lab for analysis []  - 0 Patient Transfer (multiple staff / / Similar devices) []  - 0 Simple Staple / Suture removal (25 or less) []  - 0 Complex Staple / Suture removal (26 or more) []  - 0 Hypo / Hyperglycemic Management (close monitor of Blood Glucose) []  - 0 Ankle / Brachial Index (ABI) - do not check if billed separately Has the patient been seen at the hospital within the last three years: Yes Total Score: 250 Level Of Care: New/Established - Level 5 Electronic Signature(s) Signed: 01/09/2021 4:51:59 PM By: , BSN, RN, CWS, Kim RN, BSN Entered By: , BSN, RN, CWS, Kim on 01/09/2021 14:16:20 Nurse, adult ( ) -------------------------------------------------------------------------------- Encounter Discharge Information Details Patient Name: Date of Service: 01/09/2021 1:30 PM Medical Record Number: Patient Account Number: 01/11/2021 Date of Birth/Sex: 1956/05/21 (64 y.o. F) Treating RN: 01/11/2021 Primary Care Edis Huish: Breanna Bennett Other Clinician: Referring Mong Neal: [phone] Treating Julann Mcgilvray/Extender: Breanna Bennett in Treatment: 5 Encounter Discharge Information Items Discharge Condition: Stable Ambulatory Status: Wheelchair Discharge Destination: Home Transportation: Private Auto Schedule Follow-up Appointment: Yes Clinical Summary of Care: Patient Declined Electronic Signature(s) Signed: 01/14/2021 2:18:56 PM By: [id number] RN Entered By: 02/11/1956  on 01/09/2021 14:30:24 Breanna Bennett (Blane Ohara) -------------------------------------------------------------------------------- Lower Extremity Assessment Details Patient Name: Blane Ohara Date of Service: 01/09/2021 1:30 PM Medical Record Number: [id number] Patient Account Number: 01/16/2021 Date of Birth/Sex: [birth year] (64 y.o. F) Treating RN: 01/11/2021 Primary Care Tymia Streb: Breanna Bennett Other Clinician: Referring Mylani Gentry: [phone] Treating Brentlee Sciara/Extender: Breanna Bennett in Treatment: 13 Edema Assessment Assessed: [Left: Yes] 01/11/2021: Yes] [Left: Edema] [Right: :] Calf Left: Right: Point of Measurement: 40 cm From Medial Instep 40 cm 40 cm Ankle Left: Right: Point of Measurement: 10 cm From Medial Instep 22 cm 23 cm Vascular Assessment Pulses: Dorsalis Pedis Palpable: [Left:Yes] [Right:Yes] Electronic Signature(s) Signed: 01/10/2021 8:26:01 AM By: [id number] Entered By: 02/11/1956 on 01/09/2021 13:58:55 Lorence, Hansel Feinstein (Blane Ohara) -------------------------------------------------------------------------------- Multi Wound Chart Details Patient Name: Blane Ohara Date of Service: 01/09/2021 1:30 PM Medical Record Number: Franne Forts Patient Account Number: 01/12/2021 Date of Birth/Sex: 1956/06/16 (64 y.o. F) Treating RN: 01/11/2021 Primary Care Harry Bark: [name] Other Clinician: Referring Kano Heckmann: [phone] Treating Klarisa Barman/Extender: Breanna Bennett in Treatment: 13 Vital Signs Height(in): 68 Pulse(bpm): 89 Weight(lbs): 160 Blood Pressure(mmHg): 94/66 Body Mass Index(BMI): 24 Temperature(F): 97.5 Respiratory Rate(breaths/min): 18 Photos: [2:No Photos] Wound Location: Right Lower Leg Right, Distal Lower Leg Left Lower Leg Wounding Event: Gradually Appeared Gradually Appeared Gradually Appeared Primary Etiology: Diabetic Wound/Ulcer of the Lower Diabetic Wound/Ulcer of the Lower Diabetic Wound/Ulcer of the  Lower Extremity Extremity Extremity Comorbid History: N/A Congestive Heart Failure, Congestive Heart Failure, Hypertension, Type II Diabetes Hypertension, Type II Diabetes Date Acquired: 08/26/2019 08/26/2019 08/26/2019 Weeks of Treatment: 13 13 13  Wound Status: Healed - Epithelialized Open Open Measurements L x W x D (cm) 0x0x0 1.7x1.5x0.2 4.3x5.5x0.1 Area (cm) : 0 2.003 18.575 Volume (cm) : 0 0.401 1.857 % Reduction in Area: 100.00% 65.00% -146.40% % Reduction in Volume: 100.00% 82.50% 17.90% Classification: Grade 2 Grade 2 Grade 2 Exudate Amount: N/A Medium Large  Exudate Type: N/A Serosanguineous Serous Exudate Color: N/A red, brown amber Wound Margin: N/A N/A N/A Granulation Amount: N/A Medium (34-66%) Small (1-33%) Granulation Quality: N/A Red, Pink Red, Pink Necrotic Amount: N/A Medium (34-66%) Large (67-100%) Epithelialization: N/A None Small (1-33%) Wound Number: 5 6 8  Photos: Wound Location: Right, Medial Lower Leg Left, Medial Lower Leg Medial Abdomen - Lower Quadrant Wounding Event: Gradually Appeared Gradually Appeared Gradually Appeared Primary Etiology: Diabetic Wound/Ulcer of the Lower Diabetic Wound/Ulcer of the Lower Trauma, Other Extremity Extremity Comorbid History: Congestive Heart Failure, Congestive Heart Failure, Congestive Heart Failure, Hypertension, Type II Diabetes Hypertension, Type II Diabetes Hypertension, Type II Diabetes Modi, Zlata ([id number]) Date Acquired: 02/07/2020 02/07/2020 11/27/2020 Weeks of Treatment: 13 13 4  Wound Status: Open Open Open Measurements L x W x D (cm) 3x2.5x0.2 1x1x0.1 4x2.5x1.5 Area (cm) : 5.89 0.785 7.854 Volume (cm) : 1.178 0.079 11.781 % Reduction in Area: 61.00% -300.50% -56.20% % Reduction in Volume: 61.00% -33.90% -290.60% Classification: Grade 2 Grade 2 Full Thickness Without Exposed Support Structures Exudate Amount: Medium Medium Large Exudate Type: Serosanguineous Serous Serosanguineous Exudate Color: red,  brown amber red, brown Wound Margin: Flat and Intact N/A N/A Granulation Amount: Medium (34-66%) Medium (34-66%) Small (1-33%) Granulation Quality: Red, Pink Red, Pink Red Necrotic Amount: Medium (34-66%) Medium (34-66%) Large (67-100%) Exposed Structures: Fat Layer (Subcutaneous Tissue): Fat Layer (Subcutaneous Tissue): Fat Layer (Subcutaneous Tissue): Yes Yes Yes Fascia: No Fascia: No Fascia: No Tendon: No Tendon: No Tendon: No Muscle: No Muscle: No Muscle: No Joint: No Joint: No Joint: No Bone: No Bone: No Bone: No Epithelialization: Small (1-33%) Small (1-33%) None Treatment Notes Electronic Signature(s) Signed: 01/09/2021 5:17:14 PM By: Baltazar Najjarobson, Michael MD Entered By: Baltazar Najjarobson, Michael on 01/09/2021 14:29:20 Breanna CruiseASSIDY, Mikea ([id number]) -------------------------------------------------------------------------------- Multi-Disciplinary Care Plan Details Patient Name: Breanna CruiseASSIDY, Dorrine Date of Service: 01/09/2021 1:30 PM Medical Record Number: [id number] Patient Account Number: [id number] Date of Birth/Sex: 11-12-1955 (64 y.o. F) Treating RN: Huel CoventryWoody, Kim Primary Care Jonelle Bann: Blane Oharaox , Kirsten Other Clinician: Referring Samuell Knoble: Blane Oharaox , Kirsten Treating Cassandria Drew/Extender: Altamese CarolinaOBSON, MICHAEL G Weeks in Treatment: 3913 Active Inactive Malignancy/Atypical Etiology Nursing Diagnoses: Knowledge deficit related to disease process and management of atypical ulcer etiology Knowledge deficit related to disease process and management of malignancy Goals: Patient/caregiver will verbalize understanding of disease process and disease management of atypical ulcer etiology Date Initiated: 01/09/2021 Target Resolution Date: 01/24/2021 Goal Status: Active Patient/caregiver will verbalize understanding of disease process and disease management of malignancy Date Initiated: 01/09/2021 Target Resolution Date: 01/24/2021 Goal Status: Active Interventions: Assess patient and family medical history for  signs and symptoms of malignancy/atypical etiology upon admission Provide education on atypical ulcer etiologies Provide education on malignant ulcerations Notes: Necrotic Tissue Nursing Diagnoses: Impaired tissue integrity related to necrotic/devitalized tissue Knowledge deficit related to management of necrotic/devitalized tissue Goals: Necrotic/devitalized tissue will be minimized in the wound bed Date Initiated: 01/09/2021 Target Resolution Date: 01/24/2021 Goal Status: Active Patient/caregiver will verbalize understanding of reason and process for debridement of necrotic tissue Date Initiated: 01/09/2021 Target Resolution Date: 01/24/2021 Goal Status: Active Interventions: Assess patient pain level pre-, during and post procedure and prior to discharge Provide education on necrotic tissue and debridement process Treatment Activities: Apply topical anesthetic as ordered : 01/09/2021 Excisional debridement : 01/09/2021 Notes: Soft Tissue Infection Nursing Diagnoses: Impaired tissue integrity Knowledge deficit related to disease process and management Knowledge deficit related to home infection control: handwashing, handling of soiled dressings, supply storage Potential for infection: soft tissue Miranda, Terrea ([id number]) Goals: Patient will remain free  of wound infection Date Initiated: 01/09/2021 Target Resolution Date: 01/24/2021 Goal Status: Active Patient/caregiver will verbalize understanding of or measures to prevent infection and contamination in the home setting Date Initiated: 01/09/2021 Target Resolution Date: 01/24/2021 Goal Status: Active Patient's soft tissue infection will resolve Date Initiated: 01/09/2021 Target Resolution Date: 01/24/2021 Goal Status: Active Signs and symptoms of infection will be recognized early to allow for prompt treatment Date Initiated: 01/09/2021 Target Resolution Date: 01/24/2021 Goal Status: Active Interventions: Assess signs and symptoms  of infection every visit Treatment Activities: Culture and sensitivity : 01/09/2021 Systemic antibiotics : 01/09/2021 Notes: Venous Leg Ulcer Nursing Diagnoses: Actual venous Insuffiency (use after diagnosis is confirmed) Knowledge deficit related to disease process and management Goals: Patient will maintain optimal edema control Date Initiated: 01/09/2021 Target Resolution Date: 01/24/2021 Goal Status: Active Interventions: Assess peripheral edema status every visit. Treatment Activities: Therapeutic compression applied : 01/09/2021 Notes: Wound/Skin Impairment Nursing Diagnoses: Impaired tissue integrity Knowledge deficit related to ulceration/compromised skin integrity Goals: Patient/caregiver will verbalize understanding of skin care regimen Date Initiated: 01/09/2021 Target Resolution Date: 01/24/2021 Goal Status: Active Interventions: Assess ulceration(s) every visit Treatment Activities: Referred to DME Thomasena Vandenheuvel for dressing supplies : 01/09/2021 Skin care regimen initiated : 01/09/2021 Notes: Electronic Signature(s) Signed: 01/09/2021 3:01:18 PM By: Elliot Gurney, BSN, RN, CWS, Kim RN, BSN Entered By: Elliot Gurney, BSN, RN, CWS, Kim on 01/09/2021 15:01:18 REBEL, LAUGHRIDGE ([id number]) Breanna Bennett ([id number]) -------------------------------------------------------------------------------- Pain Assessment Details Patient Name: Breanna Bennett Date of Service: 01/09/2021 1:30 PM Medical Record Number: [id number] Patient Account Number: [id number] Date of Birth/Sex: [birth year] (64 y.o. F) Treating RN: Hansel Feinstein Primary Care Jernee Murtaugh: Blane Ohara Other Clinician: Referring Nakiea Metzner: Blane Ohara Treating Ebony Rickel/Extender: Altamese [place] in Treatment: 60 Active Problems Location of Pain Severity and Description of Pain Patient Has Paino Yes Site Locations Pain Location: Pain in Ulcers Rate the pain. Current Pain Level: 9 Pain Management and Medication Current Pain  Management: Electronic Signature(s) Signed: 01/10/2021 8:26:01 AM By: Hansel Feinstein Entered By: Hansel Feinstein on 01/09/2021 13:34:35 Breanna Bennett ([id number]) -------------------------------------------------------------------------------- Patient/Caregiver Education Details Patient Name: Breanna Bennett Date of Service: 01/09/2021 1:30 PM Medical Record Number: [id number] Patient Account Number: [id number] Date of Birth/Gender: 03/29/1956 (64 y.o. F) Treating RN: Huel Coventry Primary Care Physician: Blane Ohara Other Clinician: Referring Physician: Blane Ohara Treating Physician/Extender: Altamese [place] in Treatment: 6 Education Assessment Education Provided To: Patient Education Topics Provided Venous: Handouts: Controlling Swelling with Compression Stockings Methods: Demonstration, Explain/Verbal Responses: State content correctly Wound Debridement: Handouts: Wound Debridement Methods: Demonstration, Explain/Verbal Responses: State content correctly Wound/Skin Impairment: Handouts: Caring for Your Ulcer Methods: Demonstration, Explain/Verbal Responses: State content correctly Electronic Signature(s) Signed: 01/09/2021 4:51:59 PM By: Elliot Gurney, BSN, RN, CWS, Kim RN, BSN Entered By: Elliot Gurney, BSN, RN, CWS, Kim on 01/09/2021 14:17:10 Breanna Bennett ([id number]) -------------------------------------------------------------------------------- Wound Assessment Details Patient Name: Breanna Bennett Date of Service: 01/09/2021 1:30 PM Medical Record Number: [id number] Patient Account Number: [id number] Date of Birth/Sex: 17-Feb-1956 (64 y.o. F) Treating RN: Hansel Feinstein Primary Care Cal Gindlesperger: Blane Ohara Other Clinician: Referring Wally Behan: Blane Ohara Treating Matther Labell/Extender: Altamese [place] in Treatment: 13 Wound Status Wound Number: 2 Primary Etiology: Diabetic Wound/Ulcer of the Lower Extremity Wound Location: Right Lower Leg Wound Status: Healed -  Epithelialized Wounding Event: Gradually Appeared Date Acquired: 08/26/2019 Weeks Of Treatment: 13 Clustered Wound: No Wound Measurements Length: (cm) 0 Width: (cm) 0 Depth: (cm) 0 Area: (cm) 0 Volume: (cm) 0 % Reduction in Area: 100% % Reduction in Volume:  100% Wound Description Classification: Grade 2 Treatment Notes Wound #2 (Lower Leg) Wound Laterality: Right Cleanser Peri-Wound Care Topical Primary Dressing Secondary Dressing Secured With Compression Wrap Compression Stockings Add-Ons Electronic Signature(s) Signed: 01/10/2021 8:26:01 AM By: Hansel Feinstein Entered By: Hansel Feinstein on 01/09/2021 13:55:34 Breanna Bennett ([id number]) -------------------------------------------------------------------------------- Wound Assessment Details Patient Name: Breanna Bennett Date of Service: 01/09/2021 1:30 PM Medical Record Number: [id number] Patient Account Number: [id number] Date of Birth/Sex: [birth year] (64 y.o. F) Treating RN: Hansel Feinstein Primary Care Zhaniya Swallows: Blane Ohara Other Clinician: Referring Rolene Andrades: Blane Ohara Treating Chazz Philson/Extender: Altamese [place] in Treatment: 13 Wound Status Wound Number: 3 Primary Etiology: Diabetic Wound/Ulcer of the Lower Extremity Wound Location: Right, Distal Lower Leg Wound Status: Open Wounding Event: Gradually Appeared Comorbid Congestive Heart Failure, Hypertension, Type II History: Diabetes Date Acquired: 08/26/2019 Weeks Of Treatment: 13 Clustered Wound: No Photos Wound Measurements Length: (cm) 1.7 % Red Width: (cm) 1.5 % Red Depth: (cm) 0.2 Epith Area: (cm) 2.003 Volume: (cm) 0.401 uction in Area: 65% uction in Volume: 82.5% elialization: None Wound Description Classification: Grade 2 Foul Exudate Amount: Medium Slou Exudate Type: Serosanguineous Exudate Color: red, brown Odor After Cleansing: No gh/Fibrino Yes Wound Bed Granulation Amount: Medium (34-66%) Exposed Structure Granulation Quality:  Red, Pink Fascia Exposed: No Necrotic Amount: Medium (34-66%) Fat Layer (Subcutaneous Tissue) Exposed: Yes Necrotic Quality: Adherent Slough Tendon Exposed: No Muscle Exposed: No Joint Exposed: No Bone Exposed: No Treatment Notes Wound #3 (Lower Leg) Wound Laterality: Right, Distal Cleanser Peri-Wound Care Topical Primary Dressing Franchini, Laketia ([id number]) Silvercel Small 2x2 (in/in) Discharge Instruction: Apply Silvercel Small 2x2 (in/in) as instructed Secondary Dressing ABD Pad 5x9 (in/in) Discharge Instruction: Cover with ABD pad Kerlix 4.5 x 4.1 (in/yd) Discharge Instruction: Apply Kerlix 4.5 x 4.1 (in/yd) as instructed Secured With 57M Medipore H Soft Cloth Surgical Tape, 2x2 (in/yd) Discharge Instruction: Secure kerlix Compression Wrap Jobst [place] Wrap 4000 Discharge Instruction: Secure kerlix with compression wrap Compression Stockings Add-Ons Electronic Signature(s) Signed: 01/10/2021 8:26:01 AM By: Hansel Feinstein Entered By: Hansel Feinstein on 01/09/2021 13:55:10 Breanna Bennett ([id number]) -------------------------------------------------------------------------------- Wound Assessment Details Patient Name: Breanna Bennett Date of Service: 01/09/2021 1:30 PM Medical Record Number: [id number] Patient Account Number: [id number] Date of Birth/Sex: [birth year] (64 y.o. F) Treating RN: Hansel Feinstein Primary Care Marriana Hibberd: Blane Ohara Other Clinician: Referring Carmine Youngberg: Blane Ohara Treating Cattie Tineo/Extender: Altamese [place] in Treatment: 13 Wound Status Wound Number: 4 Primary Etiology: Diabetic Wound/Ulcer of the Lower Extremity Wound Location: Left Lower Leg Wound Status: Open Wounding Event: Gradually Appeared Comorbid Congestive Heart Failure, Hypertension, Type II History: Diabetes Date Acquired: 08/26/2019 Weeks Of Treatment: 13 Clustered Wound: No Photos Wound Measurements Length: (cm) 4.3 % Red Width: (cm) 5.5 % Red Depth: (cm) 0.1  Epith Area: (cm) 18.575 Volume: (cm) 1.857 uction in Area: -146.4% uction in Volume: 17.9% elialization: Small (1-33%) Wound Description Classification: Grade 2 Foul Exudate Amount: Large Sloug Exudate Type: Serous Exudate Color: amber Odor After Cleansing: No h/Fibrino Yes Wound Bed Granulation Amount: Small (1-33%) Exposed Structure Granulation Quality: Red, Pink Fascia Exposed: No Necrotic Amount: Large (67-100%) Fat Layer (Subcutaneous Tissue) Exposed: Yes Necrotic Quality: Adherent Slough Tendon Exposed: No Muscle Exposed: No Joint Exposed: No Bone Exposed: No Treatment Notes Wound #4 (Lower Leg) Wound Laterality: Left Cleanser Peri-Wound Care Topical Primary Dressing Infantino, Ambrosia ([id number]) Silvercel Small 2x2 (in/in) Discharge Instruction: Apply Silvercel Small 2x2 (in/in) as instructed Secondary Dressing ABD Pad 5x9 (in/in) Discharge Instruction: Cover with ABD pad Kerlix 4.5 x 4.1 (  in/yd) Discharge Instruction: Apply Kerlix 4.5 x 4.1 (in/yd) as instructed Secured With 68M Medipore H Soft Cloth Surgical Tape, 2x2 (in/yd) Discharge Instruction: Secure kerlix Compression Wrap Paulino Door Wrap 4000 Discharge Instruction: Secure kerlix with compression wrap Compression Stockings Add-Ons Electronic Signature(s) Signed: 01/10/2021 8:26:01 AM By: Hansel Feinstein Entered By: Hansel Feinstein on 01/09/2021 13:52:45 Breanna Bennett ([id number]) -------------------------------------------------------------------------------- Wound Assessment Details Patient Name: Breanna Bennett Date of Service: 01/09/2021 1:30 PM Medical Record Number: [id number] Patient Account Number: [id number] Date of Birth/Sex: Jul 20, 1956 (64 y.o. F) Treating RN: Hansel Feinstein Primary Care Taro Hidrogo: Blane Ohara Other Clinician: Referring Judaea Burgoon: Blane Ohara Treating Chaise Mahabir/Extender: Altamese [place] in Treatment: 13 Wound Status Wound Number: 5 Primary Etiology: Diabetic  Wound/Ulcer of the Lower Extremity Wound Location: Right, Medial Lower Leg Wound Status: Open Wounding Event: Gradually Appeared Comorbid Congestive Heart Failure, Hypertension, Type II History: Diabetes Date Acquired: 02/07/2020 Weeks Of Treatment: 13 Clustered Wound: No Photos Wound Measurements Length: (cm) 3 % Red Width: (cm) 2.5 % Red Depth: (cm) 0.2 Epith Area: (cm) 5.89 Volume: (cm) 1.178 uction in Area: 61% uction in Volume: 61% elialization: Small (1-33%) Wound Description Classification: Grade 2 Foul Wound Margin: Flat and Intact Slou Exudate Amount: Medium Exudate Type: Serosanguineous Exudate Color: red, brown Odor After Cleansing: No gh/Fibrino Yes Wound Bed Granulation Amount: Medium (34-66%) Exposed Structure Granulation Quality: Red, Pink Fascia Exposed: No Necrotic Amount: Medium (34-66%) Fat Layer (Subcutaneous Tissue) Exposed: Yes Necrotic Quality: Adherent Slough Tendon Exposed: No Muscle Exposed: No Joint Exposed: No Bone Exposed: No Treatment Notes Wound #5 (Lower Leg) Wound Laterality: Right, Medial Cleanser Peri-Wound Care Topical Leachman, Wylie ([id number]) Primary Dressing Silvercel Small 2x2 (in/in) Discharge Instruction: Apply Silvercel Small 2x2 (in/in) as instructed Secondary Dressing ABD Pad 5x9 (in/in) Discharge Instruction: Cover with ABD pad Kerlix 4.5 x 4.1 (in/yd) Discharge Instruction: Apply Kerlix 4.5 x 4.1 (in/yd) as instructed Secured With 68M Medipore H Soft Cloth Surgical Tape, 2x2 (in/yd) Discharge Instruction: Secure kerlix Compression Wrap Jobst Farrow Wrap 4000 Discharge Instruction: Secure kerlix with compression wrap Compression Stockings Add-Ons Electronic Signature(s) Signed: 01/10/2021 8:26:01 AM By: Hansel Feinstein Entered By: Hansel Feinstein on 01/09/2021 13:54:22 Head, [name] ([id number]) -------------------------------------------------------------------------------- Wound Assessment Details Patient  Name: Breanna Bennett Date of Service: 01/09/2021 1:30 PM Medical Record Number: [id number] Patient Account Number: [id number] Date of Birth/Sex: February 16, 1956 (64 y.o. F) Treating RN: Hansel Feinstein Primary Care Rocquel Askren: Blane Ohara Other Clinician: Referring Laverna Dossett: Blane Ohara Treating Roderick Sweezy/Extender: Altamese [place] in Treatment: 13 Wound Status Wound Number: 6 Primary Etiology: Diabetic Wound/Ulcer of the Lower Extremity Wound Location: Left, Medial Lower Leg Wound Status: Open Wounding Event: Gradually Appeared Comorbid Congestive Heart Failure, Hypertension, Type II History: Diabetes Date Acquired: 02/07/2020 Weeks Of Treatment: 13 Clustered Wound: No Photos Wound Measurements Length: (cm) 1 % Re Width: (cm) 1 % Re Depth: (cm) 0.1 Epit Area: (cm) 0.785 Volume: (cm) 0.079 duction in Area: -300.5% duction in Volume: -33.9% helialization: Small (1-33%) Wound Description Classification: Grade 2 Foul Exudate Amount: Medium Slou Exudate Type: Serous Exudate Color: amber Odor After Cleansing: No gh/Fibrino Yes Wound Bed Granulation Amount: Medium (34-66%) Exposed Structure Granulation Quality: Red, Pink Fascia Exposed: No Necrotic Amount: Medium (34-66%) Fat Layer (Subcutaneous Tissue) Exposed: Yes Necrotic Quality: Adherent Slough Tendon Exposed: No Muscle Exposed: No Joint Exposed: No Bone Exposed: No Treatment Notes Wound #6 (Lower Leg) Wound Laterality: Left, Medial Cleanser Peri-Wound Care Topical Primary Dressing Sami, Thena ([id number]) Silvercel Small 2x2 (in/in) Discharge Instruction: Apply Silvercel Small  2x2 (in/in) as instructed Secondary Dressing ABD Pad 5x9 (in/in) Discharge Instruction: Cover with ABD pad Kerlix 4.5 x 4.1 (in/yd) Discharge Instruction: Apply Kerlix 4.5 x 4.1 (in/yd) as instructed Secured With 48M Medipore H Soft Cloth Surgical Tape, 2x2 (in/yd) Discharge Instruction: Secure kerlix Compression Wrap Paulino Door Wrap 4000 Discharge Instruction: Secure kerlix with compression wrap Compression Stockings Add-Ons Electronic Signature(s) Signed: 01/10/2021 8:26:01 AM By: Hansel Feinstein Entered By: Hansel Feinstein on 01/09/2021 13:53:22 Kilmartin, [name] ([id number]) -------------------------------------------------------------------------------- Wound Assessment Details Patient Name: Breanna Bennett Date of Service: 01/09/2021 1:30 PM Medical Record Number: [id number] Patient Account Number: [id number] Date of Birth/Sex: 1956-05-02 (64 y.o. F) Treating RN: Hansel Feinstein Primary Care Jenavieve Freda: Blane Ohara Other Clinician: Referring Yakir Wenke: Blane Ohara Treating Alyzabeth Pontillo/Extender: Altamese [place] in Treatment: 13 Wound Status Wound Number: 8 Primary Etiology: Trauma, Other Wound Location: Medial Abdomen - Lower Quadrant Wound Status: Open Wounding Event: Gradually Appeared Comorbid Congestive Heart Failure, Hypertension, Type II History: Diabetes Date Acquired: 11/27/2020 Weeks Of Treatment: 4 Clustered Wound: No Photos Wound Measurements Length: (cm) 4 Width: (cm) 2.5 Depth: (cm) 1.5 Area: (cm) 7.854 Volume: (cm) 11.781 % Reduction in Area: -56.2% % Reduction in Volume: -290.6% Epithelialization: None Tunneling: No Undermining: No Wound Description Classification: Full Thickness Without Exposed Support Structures Exudate Amount: Large Exudate Type: Serosanguineous Exudate Color: red, brown Foul Odor After Cleansing: No Slough/Fibrino Yes Wound Bed Granulation Amount: Small (1-33%) Exposed Structure Granulation Quality: Red Fascia Exposed: No Necrotic Amount: Large (67-100%) Fat Layer (Subcutaneous Tissue) Exposed: Yes Necrotic Quality: Adherent Slough Tendon Exposed: No Muscle Exposed: No Joint Exposed: No Bone Exposed: No Treatment Notes Wound #8 (Abdomen - Lower Quadrant) Wound Laterality: Medial Cleanser Normal Saline Discharge Instruction: Wash your hands  with soap and water. Remove old dressing, discard into plastic bag and place into trash. Cleanse the wound with Normal Saline prior to applying a clean dressing using gauze sponges, not tissues or cotton balls. Do not scrub or use excessive force. Pat dry using gauze sponges, not tissue or cotton balls. LEKEYA, ROLLINGS ([id number]) Peri-Wound Care Topical Gentamicin Discharge Instruction: Apply as directed by Kala Gassmann. Primary Dressing Silvercel Small 2x2 (in/in) Discharge Instruction: Apply Silvercel Small 2x2 (in/in) as instructed Secondary Dressing ABD Pad 5x9 (in/in) Discharge Instruction: Cover with ABD pad Secured With under garment Compression Wrap Compression Stockings Add-Ons Electronic Signature(s) Signed: 01/10/2021 8:26:01 AM By: Hansel Feinstein Entered By: Hansel Feinstein on 01/09/2021 13:42:40 Breanna Bennett ([id number]) -------------------------------------------------------------------------------- Vitals Details Patient Name: Breanna Bennett Date of Service: 01/09/2021 1:30 PM Medical Record Number: [id number] Patient Account Number: [id number] Date of Birth/Sex: 11/03/1955 (64 y.o. F) Treating RN: Hansel Feinstein Primary Care Kellyanne Ellwanger: Blane Ohara Other Clinician: Referring Prather Failla: Blane Ohara Treating Mykaila Blunck/Extender: Altamese [place] in Treatment: 13 Vital Signs Time Taken: 13:31 Temperature (F): 97.5 Height (in): 68 Pulse (bpm): 89 Weight (lbs): 160 Respiratory Rate (breaths/min): 18 Body Mass Index (BMI): 24.3 Blood Pressure (mmHg): 94/66 Reference Range: 80 - 120 mg / dl Electronic Signature(s) Signed: 01/10/2021 8:26:01 AM By: Hansel Feinstein Entered ByHansel Feinstein on 01/09/2021 13:34:23

## 2021-01-16 ENCOUNTER — Ambulatory Visit: Payer: 59 | Admitting: Internal Medicine

## 2021-01-16 ENCOUNTER — Ambulatory Visit: Admission: RE | Admit: 2021-01-16 | Payer: 59 | Source: Ambulatory Visit

## 2021-01-16 LAB — COLOGUARD: Cologuard: NEGATIVE

## 2021-01-23 ENCOUNTER — Telehealth: Payer: Self-pay | Admitting: Cardiology

## 2021-01-23 ENCOUNTER — Other Ambulatory Visit: Payer: Self-pay | Admitting: Cardiology

## 2021-01-23 ENCOUNTER — Inpatient Hospital Stay: Payer: 59 | Admitting: Family Medicine

## 2021-01-23 DIAGNOSIS — Z9229 Personal history of other drug therapy: Secondary | ICD-10-CM

## 2021-01-23 MED ORDER — SPIRONOLACTONE 25 MG PO TABS: 12.5000 mg | ORAL_TABLET | Freq: Once | ORAL | 3 refills | Status: DC

## 2021-01-23 MED ORDER — CARVEDILOL 3.125 MG PO TABS: 3.1250 mg | ORAL_TABLET | Freq: Two times a day (BID) | ORAL | 3 refills | Status: DC

## 2021-01-23 MED ORDER — WARFARIN SODIUM 2.5 MG PO TABS: ORAL_TABLET | ORAL | 0 refills | Status: DC

## 2021-01-23 NOTE — Telephone Encounter (Signed)
Coumadin sent

## 2021-01-23 NOTE — Telephone Encounter (Signed)
Refill request for Carvedilol & spironolactone sent to pharmacy. Will forward call to [facility] to address Coumadin refill request.

## 2021-01-23 NOTE — Telephone Encounter (Signed)
*  STAT* If patient is at the pharmacy, call can be transferred to refill team.   1. Which medications need to be refilled? (please list name of each medication and dose if known)  Carvedilol, Spironolactone and Warfarin  2. Which pharmacy/location (including street and city if local pharmacy) is medication to be sent to? [facility] [place], [place]  3. Do they need a 30 day or 90 day supply?  Carvedilol # 60, Spironolactone #45 and Warfarin#30

## 2021-01-27 ENCOUNTER — Other Ambulatory Visit: Payer: Self-pay | Admitting: Cardiology

## 2021-01-28 ENCOUNTER — Ambulatory Visit: Payer: 59 | Admitting: Physician Assistant

## 2021-01-28 NOTE — Telephone Encounter (Signed)
Rx approved and sent 

## 2021-01-29 ENCOUNTER — Ambulatory Visit: Admission: RE | Admit: 2021-01-29 | Payer: 59 | Source: Ambulatory Visit

## 2021-02-04 ENCOUNTER — Ambulatory Visit (INDEPENDENT_AMBULATORY_CARE_PROVIDER_SITE_OTHER): Payer: 59 | Admitting: Legal Medicine

## 2021-02-04 ENCOUNTER — Encounter: Payer: Self-pay | Admitting: Legal Medicine

## 2021-02-04 ENCOUNTER — Other Ambulatory Visit: Payer: Self-pay

## 2021-02-04 VITALS — BP 90/60 | HR 80 | Temp 97.5°F | Resp 16 | Ht 68.0 in | Wt 188.0 lb

## 2021-02-04 DIAGNOSIS — S4292XA Fracture of left shoulder girdle, part unspecified, initial encounter for closed fracture: Secondary | ICD-10-CM | POA: Diagnosis not present

## 2021-02-04 DIAGNOSIS — I6601 Occlusion and stenosis of right middle cerebral artery: Secondary | ICD-10-CM | POA: Diagnosis not present

## 2021-02-04 NOTE — Progress Notes (Signed)
Established Patient Office Visit  Subjective:  Patient ID: Breanna Bennett, female    DOB: 1956/05/12  Age: 65 y.o. MRN: [id number]  CC:  Chief Complaint  Patient presents with   Shoulder Injury    HPI Breanna Bennett presents for fall and left surgical neck humeral fracture.  She has arm sling. Pain  and on.  She is seeing orthopedics. She rolled off bed and has abrasions on left UE.   Past Medical History:  Diagnosis Date   Acute CVA (cerebrovascular accident) ([place]) 05/30/2019   Acute respiratory failure (HCC)    Acute systolic CHF (congestive heart failure) ([place]) 01/12/2019   Cardiac cachexia 19/16/6060   Chronic systolic CHF (congestive heart failure) (HCC)    Class 2 severe obesity due to excess calories with serious comorbidity and body mass index (BMI) of 38.0 to 38.9 in adult ([place]) 10/19/2018   Cough due to bronchospasm 02/16/2019   Elevated hemoglobin (HCC)    Elevated uric acid in blood 06/06/2019   Essential hypertension 01/12/2019   Gastroesophageal reflux disease 10/19/2018   GERD (gastroesophageal reflux disease)    History of Coumadin therapy 06/16/2019   History of CVA (cerebrovascular accident) 05/30/2019   History of gout 12/06/2019   History of hemiplegia 05/30/2019   Hospital discharge follow-up 06/24/2019   Hyperlipidemia    Hypertension    Hypokalemia 12/06/2019   Impaired mobility and ADLs 05/30/2019   Late effect of cerebrovascular accident (CVA) 12/12/2019   LBBB (left bundle branch block)    Left ventricular ejection fraction less than 20% 05/30/2019   Middle cerebral artery embolism, 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   Mild renal insufficiency    Mitral regurgitation    NICM (nonischemic cardiomyopathy) (HCC)    Nonischemic cardiomyopathy (HCC) cardiac catheterization May 2020 showed normal coronaries, ejection fraction 30% 02/09/2019   Open wound 1 year ago   Bilateral legs   Perennial allergic rhinitis 02/16/2019   Peripheral edema 12/06/2019   Pre-diabetes    Stroke  (cerebrum) (HCC) - R MCA infarct s/p mechanical thrombectomy 0/45/9977   Systolic CHF, acute ([place]) 02/09/2019   Tricuspid regurgitation    Type 2 diabetes mellitus with hyperglycemia, without long-term current use of insulin ([place]) 05/30/2019   Type 2 diabetes mellitus, uncontrolled ([place]) 05/27/2019    Past Surgical History:  Procedure Laterality Date   IR ANGIO VERTEBRAL SEL SUBCLAVIAN INNOMINATE UNI R MOD SED  05/19/2019   IR CT HEAD LTD  05/19/2019   IR PERCUTANEOUS ART THROMBECTOMY/INFUSION INTRACRANIAL INC DIAG ANGIO  05/19/2019   RADIOLOGY WITH ANESTHESIA N/A 05/19/2019   Procedure: IR WITH ANESTHESIA;  Surgeon: Luanne Bras, MD;  Location: [facility];  Service: Radiology;  Laterality: N/A;   RIGHT/LEFT HEART CATH AND CORONARY ANGIOGRAPHY N/A 01/13/2019   Procedure: RIGHT/LEFT HEART CATH AND CORONARY ANGIOGRAPHY;  Surgeon: Lorretta Harp, MD;  Location: [facility];  Service: Cardiovascular;  Laterality: N/A;   TUBAL LIGATION      Family History  Problem Relation Age of Onset   CAD Mother    Heart failure Mother    Hypertension Mother    Colon cancer Father    Cancer Brother     Social History   Socioeconomic History   Marital status: Unknown    Spouse name: Not on file   Number of children: Not on file   Years of education: Not on file   Highest education level: Not on file  Occupational History   Not on file  Tobacco Use   Smoking  status: Never   Smokeless tobacco: Never  Vaping Use   Vaping Use: Never used  Substance and Sexual Activity   Alcohol use: Never   Drug use: Never   Sexual activity: Not on file  Other Topics Concern   Not on file  Social History Narrative   Not on file   Social Determinants of Health   Financial Resource Strain: Not on file  Food Insecurity: Not on file  Transportation Needs: Not on file  Physical Activity: Not on file  Stress: Not on file  Social Connections: Not on file  Intimate Partner Violence: Not on file     Outpatient Medications Prior to Visit  Medication Sig Dispense Refill   Accu-Chek Softclix Lancets lancets SMARTSIG:Topical     allopurinol (ZYLOPRIM) 100 MG tablet Take 100 mg by mouth 2 (two) times daily.     atorvastatin (LIPITOR) 10 MG tablet Take 10 mg by mouth daily.     carvedilol (COREG) 3.125 MG tablet TAKE 1 TABLET(3.125 MG) BY MOUTH TWICE DAILY WITH A MEAL 180 tablet 3   dapagliflozin propanediol (FARXIGA) 5 MG TABS tablet Take 1 tablet (5 mg total) by mouth daily before breakfast. 30 tablet 2   famotidine (PEPCID) 40 MG tablet Take 40 mg by mouth at bedtime.      fluticasone (FLONASE) 50 MCG/ACT nasal spray Place 2 sprays into both nostrils daily as needed for allergies or rhinitis. 18.2 mL 3   furosemide (LASIX) 20 MG tablet TAKE 1 TABLET(20 MG) BY MOUTH TWICE DAILY 180 tablet 1   gentamicin ointment (GARAMYCIN) 0.1 % Apply topically as directed.     levothyroxine (SYNTHROID) 50 MCG tablet Take 1 tablet (50 mcg total) by mouth daily before breakfast. 90 tablet 1   loratadine (CLARITIN) 10 MG tablet Take 1 tablet (10 mg total) by mouth daily as needed for allergies or rhinitis. 90 tablet 0   potassium chloride (KLOR-CON) 10 MEQ tablet TAKE 1 TABLET(10 MEQ) BY MOUTH DAILY (Patient taking differently: Take 10 mEq by mouth daily.) 90 tablet 1   Vitamin D, Ergocalciferol, (DRISDOL) 1.25 MG (50000 UT) CAPS capsule Take 50,000 Units by mouth every Monday.      warfarin (COUMADIN) 2.5 MG tablet TAKE 1 TABLET BY MOUTH DAILY EXCEPT 1/2 TABLET ON MONDAY, WEDNESDAY, FRIDAY OR AS DIRECTED 90 tablet 0   spironolactone (ALDACTONE) 25 MG tablet Take 0.5 tablets (12.5 mg total) by mouth once for 1 dose. 30 tablet 3   No facility-administered medications prior to visit.    Allergies  Allergen Reactions   Lisinopril Cough   Losartan Cough    ROS Review of Systems  Constitutional: Negative.  Negative for activity change and appetite change.  HENT:  Negative for congestion and mouth  sores.   Eyes:  Negative for visual disturbance.  Respiratory:  Negative for chest tightness and shortness of breath.   Cardiovascular:  Negative for chest pain.  Gastrointestinal:  Negative for abdominal distention and abdominal pain.  Genitourinary:  Negative for difficulty urinating and dyspareunia.  Musculoskeletal:  Negative for arthralgias and back pain.  Neurological:  Negative for dizziness and facial asymmetry.  Psychiatric/Behavioral: Negative.       Objective:    Physical Exam Vitals reviewed.  Constitutional:      Appearance: Normal appearance.  HENT:     Head: Normocephalic.     Right Ear: Tympanic membrane, ear canal and external ear normal.     Left Ear: Tympanic membrane, ear canal and external ear normal.  Eyes:     Extraocular Movements: Extraocular movements intact.     Conjunctiva/sclera: Conjunctivae normal.     Pupils: Pupils are equal, round, and reactive to light.  Cardiovascular:     Rate and Rhythm: Normal rate and regular rhythm.     Pulses: Normal pulses.     Heart sounds: Normal heart sounds. No murmur heard.   No gallop.  Pulmonary:     Effort: Pulmonary effort is normal.     Breath sounds: Normal breath sounds.  Musculoskeletal:        General: Tenderness (left shoulder) present.     Cervical back: Normal range of motion.  Skin:    General: Skin is warm.  Neurological:     General: No focal deficit present.     Mental Status: She is alert.    BP 90/60   Pulse 80   Temp (!) 97.5 F (36.4 C)   Resp 16   Ht '5\' 8"'  (1.727 m)   Wt 188 lb (85.3 kg)   SpO2 99%   BMI 28.59 kg/m  Wt Readings from Last 3 Encounters:  02/04/21 188 lb (85.3 kg)  12/31/20 160 lb (72.6 kg)  12/13/20 161 lb (73 kg)     Health Maintenance Due  Topic Date Due   Pneumococcal Vaccine 54-16 Years old (1 - PCV) Never done   FOOT EXAM  Never done   OPHTHALMOLOGY EXAM  Never done   Hepatitis C Screening  Never done   Zoster Vaccines- Shingrix (1 of 2) Never done    PAP SMEAR-Modifier  Never done   TETANUS/TDAP  01/16/2021    There are no preventive care reminders to display for this patient.  Lab Results  Component Value Date   TSH 2.960 08/08/2020   Lab Results  Component Value Date   WBC 6.1 12/13/2020   HGB 13.3 12/13/2020   HCT 42.0 12/13/2020   MCV 83 12/13/2020   PLT 336 12/13/2020   Lab Results  Component Value Date   NA 137 12/13/2020   K 4.7 12/13/2020   CO2 21 12/13/2020   GLUCOSE 99 12/13/2020   BUN 14 12/13/2020   CREATININE 0.84 12/13/2020   BILITOT 1.4 (H) 12/13/2020   ALKPHOS 104 12/13/2020   AST 7 12/13/2020   ALT 3 12/13/2020   PROT 7.6 12/13/2020   ALBUMIN 2.8 (L) 12/13/2020   CALCIUM 8.7 12/13/2020   ANIONGAP 16 (H) 05/30/2019   EGFR 78 12/13/2020   Lab Results  Component Value Date   CHOL 76 (L) 12/13/2020   Lab Results  Component Value Date   HDL 18 (L) 12/13/2020   Lab Results  Component Value Date   LDLCALC 44 12/13/2020   Lab Results  Component Value Date   TRIG 57 12/13/2020   Lab Results  Component Value Date   CHOLHDL 4.2 12/13/2020   Lab Results  Component Value Date   HGBA1C 6.9 (H) 12/13/2020      Assessment & Plan:   Diagnoses and all orders for this visit: Middle cerebral artery embolism, right -     Ambulatory referral to Neurology No residual problems but wants to see neurologist  Closed fracture of left shoulder, initial encounter  Patient has closed fracture left shoulder, pain relieved by Tylenol    Follow-up: Return as scheduled.    Reinaldo Meeker, MD

## 2021-02-07 ENCOUNTER — Encounter: Payer: 59 | Attending: Physician Assistant | Admitting: Physician Assistant

## 2021-02-07 ENCOUNTER — Other Ambulatory Visit: Payer: Self-pay

## 2021-02-07 ENCOUNTER — Ambulatory Visit
Admission: RE | Admit: 2021-02-07 | Discharge: 2021-02-07 | Disposition: A | Discharge status: Home / Self Care | Payer: 59 | Source: Ambulatory Visit | Attending: Internal Medicine | Admitting: Internal Medicine

## 2021-02-07 DIAGNOSIS — L02211 Cutaneous abscess of abdominal wall: Secondary | ICD-10-CM | POA: Insufficient documentation

## 2021-02-07 DIAGNOSIS — I110 Hypertensive heart disease with heart failure: Secondary | ICD-10-CM | POA: Insufficient documentation

## 2021-02-07 DIAGNOSIS — L98492 Non-pressure chronic ulcer of skin of other sites with fat layer exposed: Secondary | ICD-10-CM | POA: Insufficient documentation

## 2021-02-07 DIAGNOSIS — I429 Cardiomyopathy, unspecified: Secondary | ICD-10-CM | POA: Insufficient documentation

## 2021-02-07 DIAGNOSIS — E1151 Type 2 diabetes mellitus with diabetic peripheral angiopathy without gangrene: Secondary | ICD-10-CM | POA: Insufficient documentation

## 2021-02-07 DIAGNOSIS — L97818 Non-pressure chronic ulcer of other part of right lower leg with other specified severity: Secondary | ICD-10-CM | POA: Insufficient documentation

## 2021-02-07 DIAGNOSIS — L97828 Non-pressure chronic ulcer of other part of left lower leg with other specified severity: Secondary | ICD-10-CM | POA: Insufficient documentation

## 2021-02-07 DIAGNOSIS — I509 Heart failure, unspecified: Secondary | ICD-10-CM | POA: Insufficient documentation

## 2021-02-07 DIAGNOSIS — Z7901 Long term (current) use of anticoagulants: Secondary | ICD-10-CM | POA: Insufficient documentation

## 2021-02-07 DIAGNOSIS — I890 Lymphedema, not elsewhere classified: Secondary | ICD-10-CM | POA: Insufficient documentation

## 2021-02-07 DIAGNOSIS — E11622 Type 2 diabetes mellitus with other skin ulcer: Secondary | ICD-10-CM | POA: Insufficient documentation

## 2021-02-07 LAB — POCT I-STAT CREATININE: Creatinine, Ser: 0.6 mg/dL (ref 0.44–1.00)

## 2021-02-07 IMAGING — CT CT ABD-PELV W/ CM
2 of 5 series · 15 of 46 positions shown, 17 images · IV contrast (omnipaque)
Comparison: Aortobifemoral runoff study [DATE].

CLINICAL DATA: Draining abdominal wounds. Previous debridement and
antibiotic therapy.

EXAM:
CT ABDOMEN AND PELVIS WITH CONTRAST
TECHNIQUE: Multidetector CT imaging of the abdomen and pelvis was performed
using the standard protocol following bolus administration of
intravenous contrast.
CONTRAST:  100mL OMNIPAQUE IOHEXOL 300 MG/ML  SOLN

[Series 2: abd pelvis 5.00 · axial · 0.79mm/px · z∈[-1703,-1263]mm · 12 of 100 slices shown, 14 images]
[im 6/100  soft-tissue]
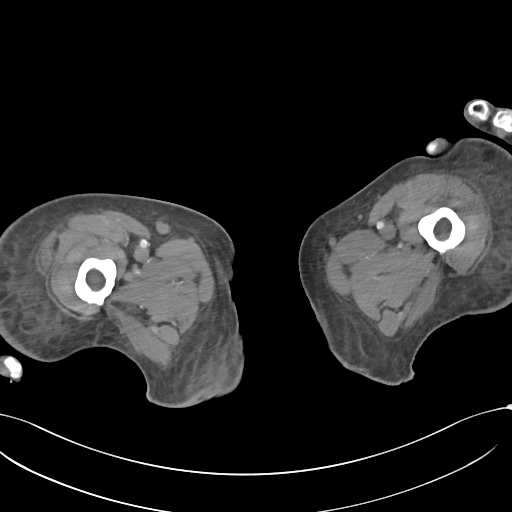
[im 6/100  bone]
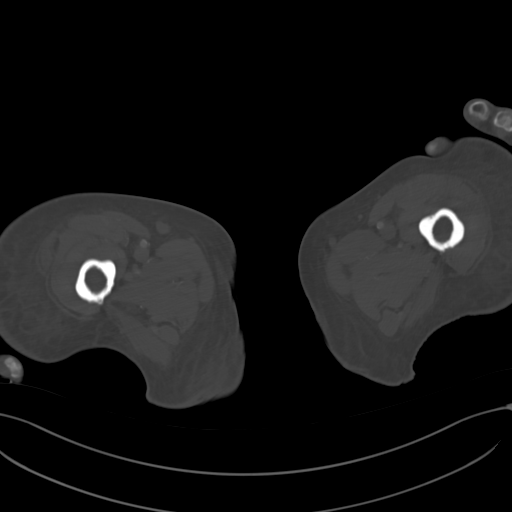
[im 18/100  soft-tissue]
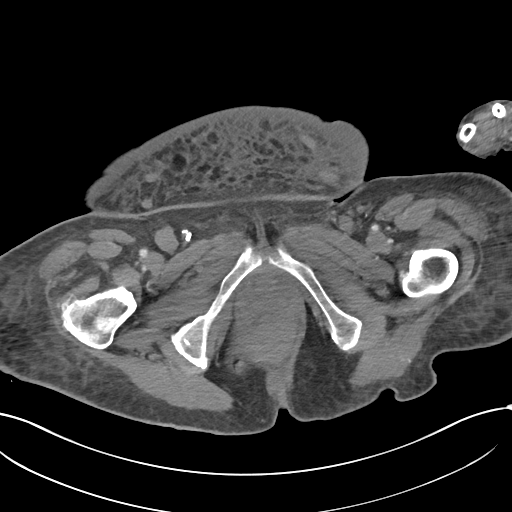
[im 24/100  soft-tissue]
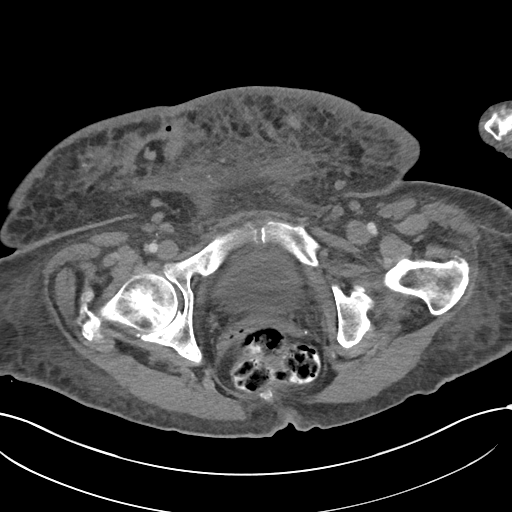
[im 30/100  soft-tissue]
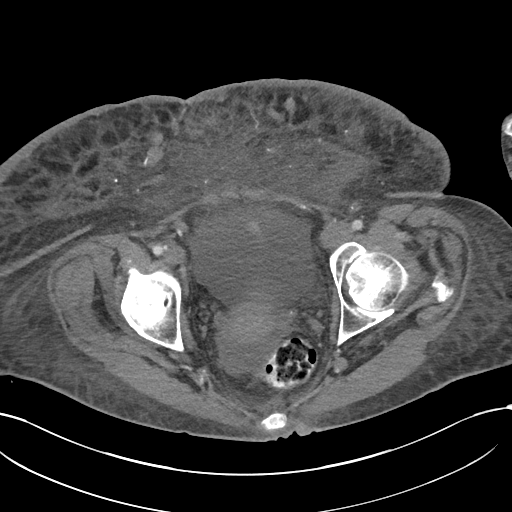
[im 41/100  soft-tissue]
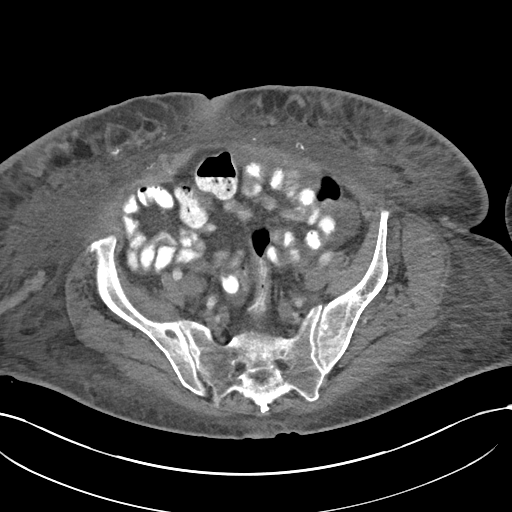
[im 47/100  soft-tissue]
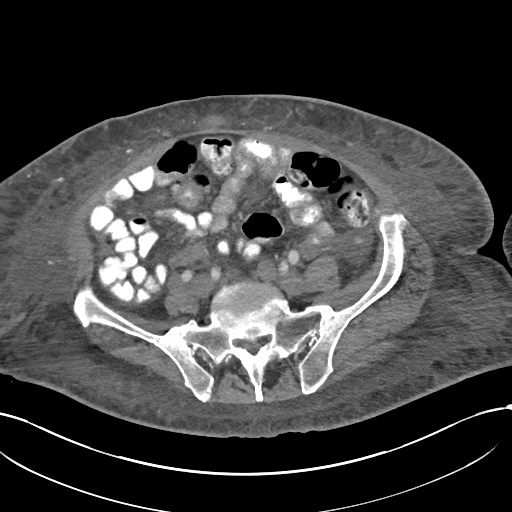
[im 53/100  soft-tissue]
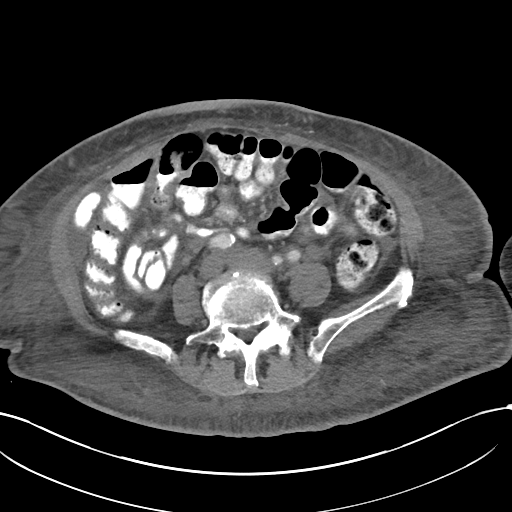
[im 65/100  soft-tissue]
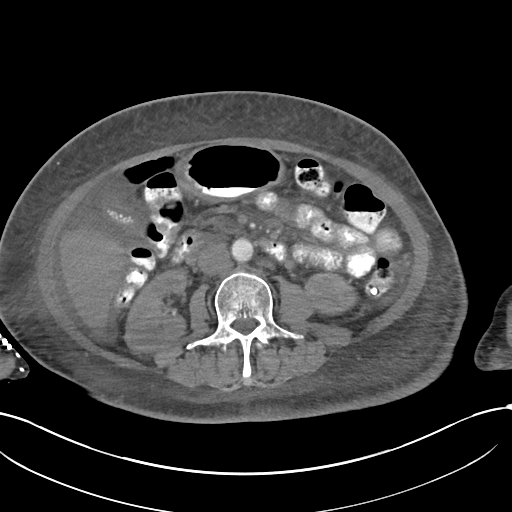
[im 70/100  soft-tissue]
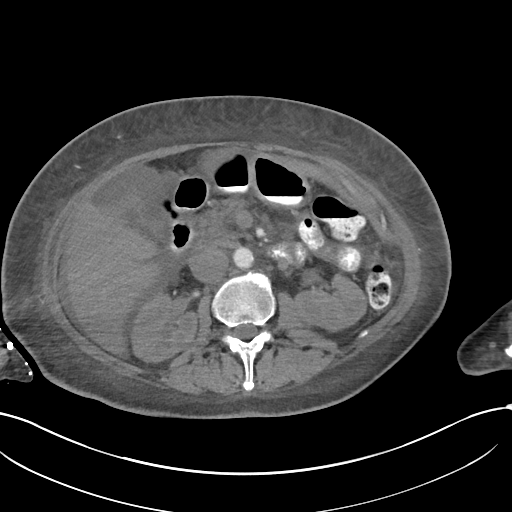
[im 70/100  bone]
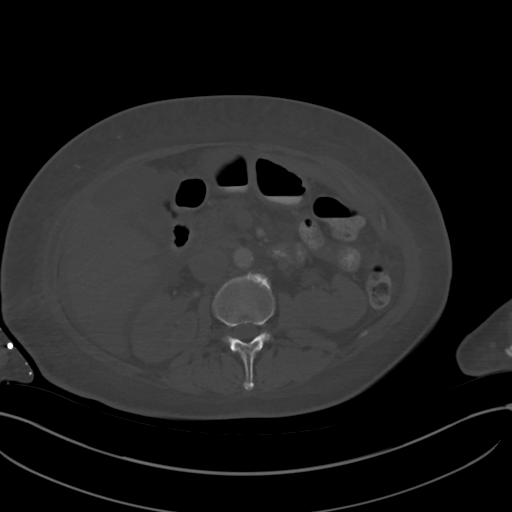
[im 76/100  soft-tissue]
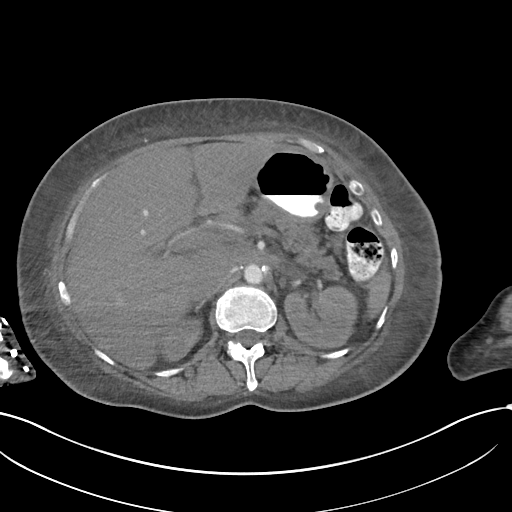
[im 88/100  soft-tissue]
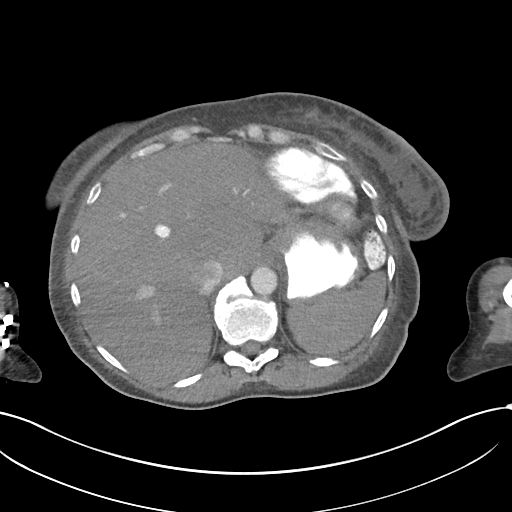
[im 94/100  soft-tissue]
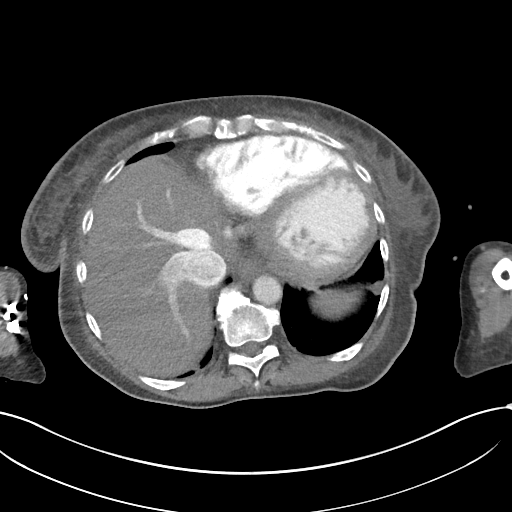

[Series 4: coronals abd pelvis 2.00 cor · coronal · 0.79mm/px · 3 of 146 slices shown]
[im 49/146  soft-tissue]
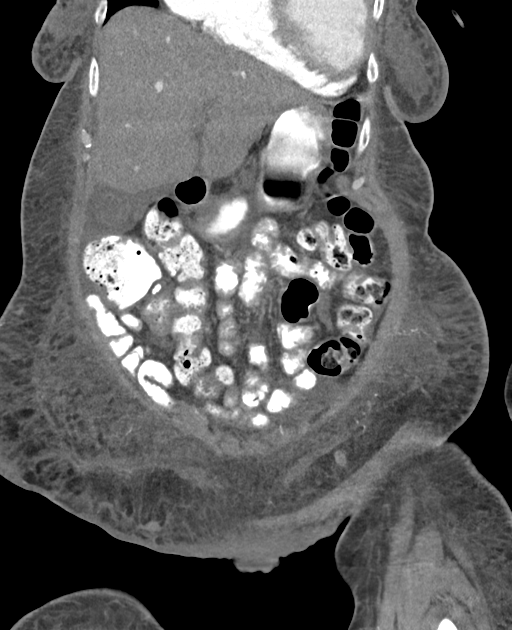
[im 65/146  soft-tissue]
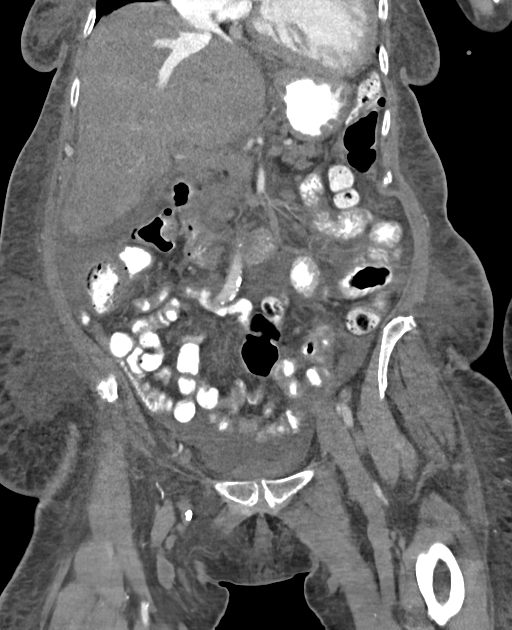
[im 81/146  soft-tissue]
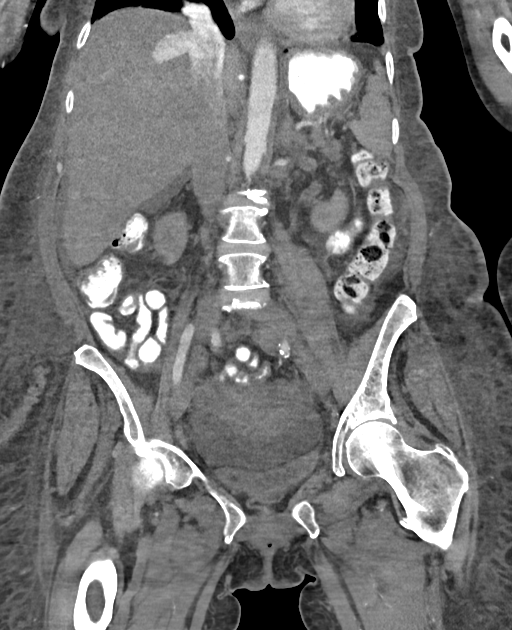

[15 of 46 positions shown; findings below may reference images not displayed]

FINDINGS: Lower chest: Marked cardiomegaly. No significant pleural or
pericardial effusion. There is subsegmental atelectasis at the left
lung base.

Hepatobiliary: The liver is normal in density without suspicious
focal abnormality. Prominent reflux of contrast into the IVC and
hepatic veins, similar to previous study. There are multiple small
calcified gallstones without apparent gallbladder wall thickening or
biliary dilatation.

Pancreas: Unremarkable. No pancreatic ductal dilatation or
surrounding inflammatory changes.

Spleen: Small calcified granulomas. No other focal abnormality or
splenomegaly.

Adrenals/Urinary Tract: Both adrenal glands appear normal. Mild left
renal cortical scarring. No evidence of renal mass, urinary tract
calculus or hydronephrosis. The bladder appears normal for its
degree of distention.

Stomach/Bowel: Enteric contrast was administered and has passed into
the rectum. The stomach appears unremarkable for its degree of
distension. No evidence of bowel wall thickening, distention or
surrounding inflammatory change. The appendix is not clearly seen.

Vascular/Lymphatic: There are no enlarged abdominal or pelvic lymph
nodes. Aortic and branch vessel atherosclerosis without evidence of
large vessel occlusion or aneurysm. The portal, superior mesenteric
and splenic veins are patent.

Reproductive: The uterus and ovaries appear normal. No adnexal mass.

Other: No visible marker indicating the area of specific concern.
There is generalized subcutaneous edema and skin thickening
throughout the abdominal wall, similar to the previous study. There
are scattered superficial varicosities. No foreign body, soft tissue
emphysema or focal fluid collection identified. There is a small to
moderate amount of ascites which has mildly improved from the prior
study. No focal extraluminal fluid or air collection identified.

Musculoskeletal: No acute or significant osseous findings. Mild
lumbar spondylosis.
IMPRESSION: 1. Anasarca with generalized body wall edema, skin thickening and
ascites, similar to previous CT from 7 months ago. No focal fluid
collection or draining wound in the abdominal wall identified.
2. Cardiomegaly with dilatation of the IVC and hepatic veins
consistent with right heart failure.
3. Cholelithiasis without evidence of cholecystitis or biliary
dilatation.
4.  Aortic Atherosclerosis ([7Q]-[7Q]).

## 2021-02-07 MED ORDER — IOHEXOL 300 MG/ML  SOLN
100.0000 mL | Freq: Once | INTRAMUSCULAR | Status: AC | PRN
  Administered 2021-02-07: 100 mL via INTRAVENOUS

## 2021-02-07 NOTE — Progress Notes (Addendum)
HAE, AHLERS ([id number]) Visit Report for 02/07/2021 Chief Complaint Document Details Patient Name: Breanna Bennett, Breanna Bennett Date of Service: 02/07/2021 2:30 PM Medical Record Number: [id number] Patient Account Number: [id number] Date of Birth/Sex: 01/15/1956 (65 y.o. F) Treating RN: Rogers Blocker Primary Care Provider: Blane Ohara Other Clinician: Referring Provider: Blane Ohara Treating Provider/Extender: Rowan Blase in Treatment: 17 Information Obtained from: Patient Chief Complaint 10/10/2020; patient is here with wounds on her bilateral lower legs Electronic Signature(s) Signed: 02/07/2021 2:53:06 PM By: Lenda Kelp PA-C Entered By: Lenda Kelp on 02/07/2021 14:53:06 Breanna Bennett ([id number]) -------------------------------------------------------------------------------- HPI Details Patient Name: Breanna Bennett Date of Service: 02/07/2021 2:30 PM Medical Record Number: [id number] Patient Account Number: [id number] Date of Birth/Sex: 10-Aug-1956 (64 y.o. F) Treating RN: Rogers Blocker Primary Care Provider: Blane Ohara Other Clinician: Referring Provider: Blane Ohara Treating Provider/Extender: Rowan Blase in Treatment: 17 History of Present Illness HPI Description: ADMISSION 10/10/2020 This is a pleasant woman from [facility] She is here accompanied by her daughter for review of wounds on her bilateral lower legs. Seems like these are chronic and have been present for the most part for about a year. She had compression stockings at one point but I am not sure that she is actually been wearing them. She has about 4 open areas on the left and more substantially for open areas on the right. She is a diabetic but is not on any current treatment. Her most other substantive problem is a severe cardiomyopathy with an ejection fraction less than 20%. Her daughter tells me that she runs a generally low blood pressure and she only takes 10 mg of Lasix  with an adjustment if her blood pressure is over a certain value giving it additional 10 mg. It does not seem like she has gained weight recently she says he is at 160 pounds and for her this is actually a reduction. They have been using Medihoney and Neosporin to this area. No recent compression wrap use. There are 4 open areas on the left and 4 open areas on the right. ABIs in our clinic were 1.35 on the right and 1.22 on the left 11/26/2020 on evaluation today patient appears to be doing poorly in regard to her lower extremities. With that being said the biggest issue currently seems to be really that she is having issues with extreme edema of the lower extremities and Dr. Leanord Hawking was concerned about the fact that to be honest the patient really needs to be seen by cardiology to see if there is anything they can do to help diurese her more as he was concerned about doing too strong of a compression. For that reason we have been doing Kerlix and Coban only for now patient has not seen her cardiologist we have not seen her since February 16 of this year. Unfortunately her wounds appear to be a bit worse than they were previous. 12/03/20 upon evaluation today patient appears to be doing well with regard to her wounds. She has been tolerating the dressing changes without complication. Fortunately there does not appear to be any signs of infection. She still has a tremendous amount of edema but this is being managed by her cardiologist in fact he did put her on some medications yesterday. This included increasing her furosemide to 20 mg 2 times a day. He is also lowering her blood pressure medication to compensate. Obviously this is a very fine balance with this patient. It was actually mentioned  in the note that the patient may need to consider palliative care. Obviously were trying to avoid making anything worse while still healing her wounds. 12/11/2020 upon evaluation today patient appears to be doing  well with regard to her wounds over the lower extremities. Unfortunately she has a new wound in the abdominal area as well underneath the periumbilical region which unfortunately she did not tell anybody about into I walked into the room. With that being said I think that we need to manage this as well today. She does have again some good granulation epithelization in regard to leg ulcers I feel like they are doing better. Although I am not really sure exactly what she is doing with the dressing she seems to be putting a lot of Band-Aids on the area. That is not really the ideal thing. 5/11; patient admitted to the clinic in February. She did not really resurface again until early April and Hoyte stones been seeing her since then. She had wounds on her bilateral lower extremities probably chronic venous with lymphedema. She has been doing reasonably well. However 3 weeks ago she came in the clinic with abdominal wound located in the right lower quadrant.. A culture of them showed Serratia. She is seeing received a course of Omnicef that she finished. She comes in with 2 open wounds 1 in the right mid lower abdominal quadrant a large necrotic wound with some depth and a smaller draining area in the right lower quadrant distally. Both of these look somewhat purulent. She is not complaining of a lot of pain thinks the wounds have been there for about 2 months but really not a lot of specific history here. We have been using silver alginate on the lower extremity wounds under compression and I believe silver alginate on the abdominal wounds 5/18; this is a patient I saw for the first time last week. She has wounds on her bilateral lower legs which look like chronic venous wounds with lymphedema. They have been doing reasonably well with silver collagen under the patient's own Farrow wraps. It came out today that she is really not allowing for family help with putting dressings on and as result she came in  with very little today I talked to her about this In the last 2 weeks we have discovered that a large abdominal mass. She has a CT scan on the 25th which should tell us the nature of this although I am concerned about an underlying malignancy. She also has a necrotic wound malodorous. Culture I did last week showed Enterobacter cloacae I and Pseudomonas. Prescribing antibiotics for her complicated by the fact that she is on Coumadin with an INR of 3 on 5/9 02/07/2021 upon evaluation today patient appears to be doing well with regard to her leg ulcers. This is great news. With that being said unfortunately the patient does still have an issue in her abdominal area. We do not know the results of any testing yet as she is not having her CT scan until later this afternoon. With that being said I do see evidence that she is to be honest still having a fairly significant area of induration in the abdominal area. The wound actually appears to be bigger and deeper here than when I saw it for certain and to be honest the draining sinus down below the still showing signs of draining quite frequently her daughter tells me. Subsequently the patient knows this is where she is hurting as well. From the  standpoint of her legs again everything appears to be doing quite well. Electronic Signature(s) Signed: 02/07/2021 3:38:37 PM By: Lenda KelpStone III, Adalena Abdulla PA-C Entered By: Lenda KelpStone III, Haynes Giannotti on 02/07/2021 15:38:37 Breanna CruiseCASSIDY, Betsy ([id number]) -------------------------------------------------------------------------------- Physical Exam Details Patient Name: Breanna CruiseASSIDY, Lakayla Date of Service: 02/07/2021 2:30 PM Medical Record Number: [id number] Patient Account Number: [id number] Date of Birth/Sex: 03/28/1956 (64 y.o. F) Treating RN: Rogers BlockerSanchez, Kenia Primary Care Provider: Blane Oharaox , Kirsten Other Clinician: Referring Provider: Blane Oharaox , Kirsten Treating Provider/Extender: Rowan BlaseStone, Elliette Seabolt Weeks in Treatment: 17 Constitutional Thin and  well-hydrated in no acute distress. Respiratory normal breathing without difficulty. Psychiatric this patient is able to make decisions and demonstrates good insight into disease process. Alert and Oriented x 3. pleasant and cooperative. Notes Upon inspection patient's wound bed actually showed signs of good granulation epithelization at this point. Fortunately there does not appear to be any evidence of infection in regard to the patient's legs. With that being said that is not my main concern here. Her legs are doing quite well her abdominal area is not doing nearly as well. We will start using gentamicin but nonetheless we still know exactly what is going on here. She has her test later this afternoon. Electronic Signature(s) Signed: 02/07/2021 3:39:18 PM By: Lenda KelpStone III, Ashtin Melichar PA-C Entered By: Lenda KelpStone III, Zaylee Cornia on 02/07/2021 15:39:18 Breanna CruiseASSIDY, Cecilee ([id number]) -------------------------------------------------------------------------------- Physician Orders Details Patient Name: Breanna CruiseASSIDY, Shuna Date of Service: 02/07/2021 2:30 PM Medical Record Number: [id number] Patient Account Number: [id number] Date of Birth/Sex: 03/28/1956 (64 y.o. F) Treating RN: Rogers BlockerSanchez, Kenia Primary Care Provider: Blane Oharaox , Kirsten Other Clinician: Referring Provider: Blane Oharaox , Kirsten Treating Provider/Extender: Rowan BlaseStone, Tildon Silveria Weeks in Treatment: 17 Verbal / Phone Orders: No Diagnosis Coding ICD-10 Coding Code Description E11.622 Type 2 diabetes mellitus with other skin ulcer I87.323 Chronic venous hypertension (idiopathic) with inflammation of bilateral lower extremity L97.828 Non-pressure chronic ulcer of other part of left lower leg with other specified severity L97.818 Non-pressure chronic ulcer of other part of right lower leg with other specified severity L02.211 Cutaneous abscess of abdominal wall L98.492 Non-pressure chronic ulcer of skin of other sites with fat layer exposed Unspecified open wound of abdominal  wall, right lower quadrant without penetration into peritoneal cavity, subsequent S31.103D encounter Follow-up Appointments o Return Appointment in 1 week. Bathing/ Shower/ Hygiene o May shower; gently cleanse wound with antibacterial soap, rinse and pat dry prior to dressing wounds Edema Control - Lymphedema / Segmental Compressive Device / Other Bilateral Lower Extremities o Elevate, Exercise Daily and Avoid Standing for Long Periods of Time. o Elevate legs to the level of the heart and pump ankles as often as possible o Elevate leg(s) parallel to the floor when sitting. Wound Treatment Wound #3 - Lower Leg Wound Laterality: Right, Distal Primary Dressing: Prisma 4.34 (in) 3 x Per Week/30 Days Discharge Instructions: Moisten w/normal saline or sterile water; Cover wound as directed. Do not remove from wound bed. Secondary Dressing: ABD Pad 5x9 (in/in) (Generic) 3 x Per Week/30 Days Discharge Instructions: Cover with ABD pad Secondary Dressing: Kerlix 4.5 x 4.1 (in/yd) (Generic) 3 x Per Week/30 Days Discharge Instructions: Apply Kerlix 4.5 x 4.1 (in/yd) as instructed Secured With: 4M Medipore H Soft Cloth Surgical Tape, 2x2 (in/yd) (Generic) 3 x Per Week/30 Days Discharge Instructions: Secure kerlix Compression Wrap: Jobst Farrow Wrap 4000 3 x Per Week/30 Days Discharge Instructions: Secure kerlix with compression wrap Wound #4 - Lower Leg Wound Laterality: Left Primary Dressing: Prisma 4.34 (in) 3 x Per Week/30 Days Discharge Instructions: Moisten w/normal  saline or sterile water; Cover wound as directed. Do not remove from wound bed. Secondary Dressing: ABD Pad 5x9 (in/in) (Generic) 3 x Per Week/30 Days Discharge Instructions: Cover with ABD pad Secondary Dressing: Kerlix 4.5 x 4.1 (in/yd) (Generic) 3 x Per Week/30 Days Discharge Instructions: Apply Kerlix 4.5 x 4.1 (in/yd) as instructed Secured With: 60M Medipore H Soft Cloth Surgical Tape, 2x2 (in/yd) (Generic) 3 x Per  Week/30 Days Discharge Instructions: Secure KEMESHA, BUGAY ([id number]) Compression Wrap: Jobst Farrow Wrap 4000 3 x Per Week/30 Days Discharge Instructions: Secure kerlix with compression wrap Wound #5 - Lower Leg Wound Laterality: Right, Medial Primary Dressing: Prisma 4.34 (in) 3 x Per Week/30 Days Discharge Instructions: Moisten w/normal saline or sterile water; Cover wound as directed. Do not remove from wound bed. Secondary Dressing: ABD Pad 5x9 (in/in) (Generic) 3 x Per Week/30 Days Discharge Instructions: Cover with ABD pad Secondary Dressing: Kerlix 4.5 x 4.1 (in/yd) (Generic) 3 x Per Week/30 Days Discharge Instructions: Apply Kerlix 4.5 x 4.1 (in/yd) as instructed Secured With: 60M Medipore H Soft Cloth Surgical Tape, 2x2 (in/yd) (Generic) 3 x Per Week/30 Days Discharge Instructions: Secure kerlix Compression Wrap: Jobst Farrow Wrap 4000 3 x Per Week/30 Days Discharge Instructions: Secure kerlix with compression wrap Wound #8 - Abdomen - Lower Quadrant Wound Laterality: Medial Topical: Gentamicin 1 x Per Day/30 Days Discharge Instructions: Apply as directed by provider. Primary Dressing: Silvercel Small 2x2 (in/in) (Generic) 1 x Per Day/30 Days Discharge Instructions: Apply Silvercel Small 2x2 (in/in) as instructed Secondary Dressing: ABD Pad 5x9 (in/in) (Generic) 1 x Per Day/30 Days Discharge Instructions: Cover with ABD pad Secured With: under garment (Generic) 1 x Per Day/30 Days Discharge Instructions: secure in place with undergarment Electronic Signature(s) Signed: 02/07/2021 3:48:46 PM By: Phillis Haggis, Dondra Prader RN Signed: 02/07/2021 4:01:31 PM By: Lenda Kelp PA-C Entered By: Phillis Haggis, Dondra Prader on 02/07/2021 15:16:19 Breanna Bennett ([id number]) -------------------------------------------------------------------------------- Problem List Details Patient Name: Breanna Bennett Date of Service: 02/07/2021 2:30 PM Medical Record Number: [id number] Patient  Account Number: [id number] Date of Birth/Sex: 09-28-1955 (64 y.o. F) Treating RN: Rogers Blocker Primary Care Provider: Blane Ohara Other Clinician: Referring Provider: Blane Ohara Treating Provider/Extender: Rowan Blase in Treatment: 17 Active Problems ICD-10 Encounter Code Description Active Date MDM Diagnosis E11.622 Type 2 diabetes mellitus with other skin ulcer 10/10/2020 No Yes I87.323 Chronic venous hypertension (idiopathic) with inflammation of bilateral 10/10/2020 No Yes lower extremity L97.828 Non-pressure chronic ulcer of other part of left lower leg with other 10/10/2020 No Yes specified severity L97.818 Non-pressure chronic ulcer of other part of right lower leg with other 10/10/2020 No Yes specified severity L02.211 Cutaneous abscess of abdominal wall 12/11/2020 No Yes L98.492 Non-pressure chronic ulcer of skin of other sites with fat layer exposed 12/11/2020 No Yes S31.103D Unspecified open wound of abdominal wall, right lower quadrant without 01/02/2021 No Yes penetration into peritoneal cavity, subsequent encounter Inactive Problems ICD-10 Code Description Active Date Inactive Date I50.22 Chronic systolic (congestive) heart failure 10/10/2020 10/10/2020 Resolved Problems Electronic Signature(s) Signed: 02/07/2021 2:52:59 PM By: Lenda Kelp PA-C Entered By: Lenda Kelp on 02/07/2021 14:52:59 Breanna Bennett ([id number]) -------------------------------------------------------------------------------- Progress Note Details Patient Name: Breanna Bennett Date of Service: 02/07/2021 2:30 PM Medical Record Number: [id number] Patient Account Number: [id number] Date of Birth/Sex: 03-16-1956 (64 y.o. F) Treating RN: Rogers Blocker Primary Care Provider: Blane Ohara Other Clinician: Referring Provider: Blane Ohara Treating Provider/Extender: Rowan Blase in Treatment: 17 Subjective Chief Complaint Information obtained from Patient  10/10/2020; patient is  here with wounds on her bilateral lower legs History of Present Illness (HPI) ADMISSION 10/10/2020 This is a pleasant woman from [facility]. She is here accompanied by her daughter for review of wounds on her bilateral lower legs. Seems like these are chronic and have been present for the most part for about a year. She had compression stockings at one point but I am not sure that she is actually been wearing them. She has about 4 open areas on the left and more substantially for open areas on the right. She is a diabetic but is not on any current treatment. Her most other substantive problem is a severe cardiomyopathy with an ejection fraction less than 20%. Her daughter tells me that she runs a generally low blood pressure and she only takes 10 mg of Lasix with an adjustment if her blood pressure is over a certain value giving it additional 10 mg. It does not seem like she has gained weight recently she says he is at 160 pounds and for her this is actually a reduction. They have been using Medihoney and Neosporin to this area. No recent compression wrap use. There are 4 open areas on the left and 4 open areas on the right. ABIs in our clinic were 1.35 on the right and 1.22 on the left 11/26/2020 on evaluation today patient appears to be doing poorly in regard to her lower extremities. With that being said the biggest issue currently seems to be really that she is having issues with extreme edema of the lower extremities and Dr. Leanord Hawking was concerned about the fact that to be honest the patient really needs to be seen by cardiology to see if there is anything they can do to help diurese her more as he was concerned about doing too strong of a compression. For that reason we have been doing Kerlix and Coban only for now patient has not seen her cardiologist we have not seen her since February 16 of this year. Unfortunately her wounds appear to be a bit worse than they were  previous. 12/03/20 upon evaluation today patient appears to be doing well with regard to her wounds. She has been tolerating the dressing changes without complication. Fortunately there does not appear to be any signs of infection. She still has a tremendous amount of edema but this is being managed by her cardiologist in fact he did put her on some medications yesterday. This included increasing her furosemide to 20 mg 2 times a day. He is also lowering her blood pressure medication to compensate. Obviously this is a very fine balance with this patient. It was actually mentioned in the note that the patient may need to consider palliative care. Obviously were trying to avoid making anything worse while still healing her wounds. 12/11/2020 upon evaluation today patient appears to be doing well with regard to her wounds over the lower extremities. Unfortunately she has a new wound in the abdominal area as well underneath the periumbilical region which unfortunately she did not tell anybody about into I walked into the room. With that being said I think that we need to manage this as well today. She does have again some good granulation epithelization in regard to leg ulcers I feel like they are doing better. Although I am not really sure exactly what she is doing with the dressing she seems to be putting a lot of Band-Aids on the area. That is not really the ideal thing.  5/11; patient admitted to the clinic in February. She did not really resurface again until early April and Hoyte stones been seeing her since then. She had wounds on her bilateral lower extremities probably chronic venous with lymphedema. She has been doing reasonably well. However 3 weeks ago she came in the clinic with abdominal wound located in the right lower quadrant.. A culture of them showed Serratia. She is seeing received a course of Omnicef that she finished. She comes in with 2 open wounds 1 in the right mid lower abdominal  quadrant a large necrotic wound with some depth and a smaller draining area in the right lower quadrant distally. Both of these look somewhat purulent. She is not complaining of a lot of pain thinks the wounds have been there for about 2 months but really not a lot of specific history here. We have been using silver alginate on the lower extremity wounds under compression and I believe silver alginate on the abdominal wounds 5/18; this is a patient I saw for the first time last week. She has wounds on her bilateral lower legs which look like chronic venous wounds with lymphedema. They have been doing reasonably well with silver collagen under the patient's own Farrow wraps. It came out today that she is really not allowing for family help with putting dressings on and as result she came in with very little today I talked to her about this In the last 2 weeks we have discovered that a large abdominal mass. She has a CT scan on the 25th which should tell us the nature of this although I am concerned about an underlying malignancy. She also has a necrotic wound malodorous. Culture I did last week showed Enterobacter cloacae I and Pseudomonas. Prescribing antibiotics for her complicated by the fact that she is on Coumadin with an INR of 3 on 5/9 02/07/2021 upon evaluation today patient appears to be doing well with regard to her leg ulcers. This is great news. With that being said unfortunately the patient does still have an issue in her abdominal area. We do not know the results of any testing yet as she is not having her CT scan until later this afternoon. With that being said I do see evidence that she is to be honest still having a fairly significant area of induration in the abdominal area. The wound actually appears to be bigger and deeper here than when I saw it for certain and to be honest the draining sinus down below the still showing signs of draining quite frequently her daughter tells me.  Subsequently the patient knows this is where she is hurting as well. From the standpoint of her legs again everything appears to be doing quite well. Goeken, Keziah ([id number]) Objective Constitutional Thin and well-hydrated in no acute distress. Vitals Time Taken: 2:39 PM, Height: 68 in, Weight: 160 lbs, BMI: 24.3, Temperature: 97.5 F, Pulse: 86 bpm, Respiratory Rate: 18 breaths/min, Blood Pressure: 107/76 mmHg. Respiratory normal breathing without difficulty. Psychiatric this patient is able to make decisions and demonstrates good insight into disease process. Alert and Oriented x 3. pleasant and cooperative. General Notes: Upon inspection patient's wound bed actually showed signs of good granulation epithelization at this point. Fortunately there does not appear to be any evidence of infection in regard to the patient's legs. With that being said that is not my main concern here. Her legs are doing quite well her abdominal area is not doing nearly as well. We will start  using gentamicin but nonetheless we still know exactly what is going on here. She has her test later this afternoon. Integumentary (Hair, Skin) Wound #3 status is Open. Original cause of wound was Gradually Appeared. The date acquired was: 08/26/2019. The wound has been in treatment 17 weeks. The wound is located on the Right,Distal Lower Leg. The wound measures 1.2cm length x 1.2cm width x 0.2cm depth; 1.131cm^2 area and 0.226cm^3 volume. There is Fat Layer (Subcutaneous Tissue) exposed. There is no tunneling or undermining noted. There is a medium amount of serosanguineous drainage noted. There is medium (34-66%) red, pink, pale granulation within the wound bed. There is a medium (34- 66%) amount of necrotic tissue within the wound bed including Adherent Slough. Wound #4 status is Open. Original cause of wound was Gradually Appeared. The date acquired was: 08/26/2019. The wound has been in treatment 17 weeks. The wound is  located on the Left Lower Leg. The wound measures 2.4cm length x 3cm width x 0.2cm depth; 5.655cm^2 area and 1.131cm^3 volume. There is Fat Layer (Subcutaneous Tissue) exposed. There is a medium amount of serous drainage noted. There is small (1-33%) red, pink granulation within the wound bed. There is a small (1-33%) amount of necrotic tissue within the wound bed including Adherent Slough. Wound #5 status is Open. Original cause of wound was Gradually Appeared. The date acquired was: 02/07/2020. The wound has been in treatment 17 weeks. The wound is located on the Right,Medial Lower Leg. The wound measures 1.8cm length x 2cm width x 0.2cm depth; 2.827cm^2 area and 0.565cm^3 volume. There is Fat Layer (Subcutaneous Tissue) exposed. There is no tunneling or undermining noted. There is a medium amount of serous drainage noted. The wound margin is flat and intact. There is medium (34-66%) pink, pale granulation within the wound bed. There is a medium (34-66%) amount of necrotic tissue within the wound bed including Eschar and Adherent Slough. Wound #6 status is Healed - Epithelialized. Original cause of wound was Gradually Appeared. The date acquired was: 02/07/2020. The wound has been in treatment 17 weeks. The wound is located on the Left,Medial Lower Leg. The wound measures 0cm length x 0cm width x 0cm depth; 0cm^2 area and 0cm^3 volume. Wound #8 status is Open. Original cause of wound was Gradually Appeared. The date acquired was: 11/27/2020. The wound has been in treatment 8 weeks. The wound is located on the Medial Abdomen - Lower Quadrant. The wound measures 2cm length x 1cm width x 1cm depth; 1.571cm^2 area and 1.571cm^3 volume. There is Fat Layer (Subcutaneous Tissue) exposed. There is no tunneling noted, however, there is undermining starting at 7:00 and ending at 12:00 with a maximum distance of 2cm. There is a large amount of serosanguineous drainage noted. There is small (1-33%) pink  granulation within the wound bed. There is a large (67-100%) amount of necrotic tissue within the wound bed including Adherent Slough. 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 Bathing/ Shower/ Hygiene: May shower; gently cleanse wound with  antibacterial soap, rinse and pat dry prior to dressing wounds Edema Control - Lymphedema / Segmental Compressive Device / Other: Elevate, Exercise Daily and Avoid Standing for Long Periods of Time. Elevate legs to the level of the heart and pump ankles as often as possible Elevate leg(s) parallel to the floor when sitting. WOUND #3: - Lower Leg Wound Laterality: Right, Distal Primary Dressing: Prisma 4.34 (in) 3 x Per Week/30 Days Discharge Instructions: Moisten w/normal saline or sterile water; Cover wound as directed. Do not remove from wound bed. Secondary Dressing: ABD Pad 5x9 (in/in) (Generic) 3 x Per Week/30 Days Discharge Instructions: Cover with ABD pad Secondary Dressing: Kerlix 4.5 x 4.1 (in/yd) (Generic) 3 x Per Week/30 Days Discharge Instructions: Apply Kerlix 4.5 x 4.1 (in/yd) as instructed Secured With: 49M Medipore H Soft Cloth Surgical Tape, 2x2 (in/yd) (Generic) 3 x Per Week/30 Days Discharge Instructions: Secure kerlix Compression Wrap: Jobst Farrow Wrap 4000 3 x Per Week/30 Days Discharge Instructions: Secure  kerlix with compression wrap WOUND #4: - Lower Leg Wound Laterality: Left Primary Dressing: Prisma 4.34 (in) 3 x Per Week/30 Days Discharge Instructions: Moisten w/normal saline or sterile water; Cover wound as directed. Do not remove from wound bed. Secondary Dressing: ABD Pad 5x9 (in/in) (Generic) 3 x Per Week/30 Days Discharge Instructions: Cover with ABD pad Secondary Dressing: Kerlix 4.5 x 4.1 (in/yd) (Generic) 3 x Per Week/30 Days Discharge Instructions: Apply Kerlix 4.5 x 4.1 (in/yd) as instructed Secured With: 49M Medipore H Soft Cloth Surgical Tape, 2x2 (in/yd) (Generic) 3 x Per Week/30 Days Discharge Instructions: Secure kerlix Compression Wrap: Jobst Farrow Wrap 4000 3 x Per Week/30 Days Discharge Instructions: Secure kerlix with compression wrap WOUND #5: - Lower Leg Wound Laterality: Right, Medial Primary Dressing: Prisma 4.34 (in) 3 x Per Week/30 Days Discharge Instructions: Moisten w/normal saline or sterile water; Cover wound as directed. Do not remove from wound bed. Secondary Dressing: ABD Pad 5x9 (in/in) (Generic) 3 x Per Week/30 Days Discharge Instructions: Cover with ABD pad Secondary Dressing: Kerlix 4.5 x 4.1 (in/yd) (Generic) 3 x Per Week/30 Days Discharge Instructions: Apply Kerlix 4.5 x 4.1 (in/yd) as instructed Secured With: 49M Medipore H Soft Cloth Surgical Tape, 2x2 (in/yd) (Generic) 3 x Per Week/30 Days Discharge Instructions: Secure kerlix Compression Wrap: Jobst Farrow Wrap 4000 3 x Per Week/30 Days Discharge Instructions: Secure kerlix with compression wrap WOUND #8: - Abdomen - Lower Quadrant Wound Laterality: Medial Topical: Gentamicin 1 x Per Day/30 Days Discharge Instructions: Apply as directed by provider. Primary Dressing: Silvercel Small 2x2 (in/in) (Generic) 1 x Per Day/30 Days Discharge Instructions: Apply Silvercel Small 2x2 (in/in) as instructed Secondary Dressing: ABD Pad 5x9 (in/in) (Generic) 1 x Per Day/30 Days Discharge Instructions:  Cover with ABD pad Secured With: under garment (Generic) 1 x Per Day/30 Days Discharge Instructions: secure in place with undergarment 1. Would recommend currently that we go ahead and initiate treatment with a continuation of the gentamicin followed by silver alginate to the abdominal areas. 2. I am also can recommend silver collagen be continued for the legs. 3. I am also going to suggest that we continue with the compression wrapping using the patient's own Velcro compression wraps. We will use an collagen to the wounds on her legs. Overall this seems to be doing quite well. We will see patient back for reevaluation in 1 week here in the clinic. If anything worsens or changes patient will contact our office for additional recommendations. Electronic Signature(s) Signed: 02/07/2021 3:40:13 PM By: Linwood Dibbles,  Derotha Fishbaugh PA-C Entered By: Lenda Kelp on 02/07/2021 15:40:13 Breanna Bennett ([id number]) -------------------------------------------------------------------------------- SuperBill Details Patient Name: Breanna Bennett Date of Service: 02/07/2021 Medical Record Number: [id number] Patient Account Number: [id number] Date of Birth/Sex: 07/12/1956 (64 y.o. F) Treating RN: Rogers Blocker Primary Care Provider: Blane Ohara Other Clinician: Referring Provider: Blane Ohara Treating Provider/Extender: Rowan Blase in Treatment: 17 Diagnosis Coding ICD-10 Codes Code Description E11.622 Type 2 diabetes mellitus with other skin ulcer I87.323 Chronic venous hypertension (idiopathic) with inflammation of bilateral lower extremity L97.828 Non-pressure chronic ulcer of other part of left lower leg with other specified severity L97.818 Non-pressure chronic ulcer of other part of right lower leg with other specified severity L02.211 Cutaneous abscess of abdominal wall L98.492 Non-pressure chronic ulcer of skin of other sites with fat layer exposed Unspecified open wound of abdominal wall,  right lower quadrant without penetration into peritoneal cavity, subsequent S31.103D encounter Facility Procedures CPT4 Code: 62694854 Description: 99214 - WOUND CARE VISIT-LEV 4 EST PT Modifier: Quantity: 1 Physician Procedures CPT4 Code: 6270350 Description: 99214 - WC PHYS LEVEL 4 - EST PT Modifier: Quantity: 1 CPT4 Code: Description: ICD-10 Diagnosis Description E11.622 Type 2 diabetes mellitus with other skin ulcer I87.323 Chronic venous hypertension (idiopathic) with inflammation of bilateral low L97.828 Non-pressure chronic ulcer of other part of left lower leg with  other speci L97.818 Non-pressure chronic ulcer of other part of right lower leg with other spec Modifier: er extremity fied severity ified severity Quantity: Electronic Signature(s) Signed: 02/07/2021 3:40:34 PM By: Lenda Kelp PA-C Entered By: Lenda Kelp on 02/07/2021 15:40:34

## 2021-02-11 NOTE — Progress Notes (Signed)
EYVA, CALIFANO ([id number]) Visit Report for 02/07/2021 Arrival Information Details Patient Name: Breanna Bennett, Breanna Bennett Date of Service: 02/07/2021 2:30 PM Medical Record Number: [id number] Patient Account Number: [id number] Date of Birth/Sex: Sep 02, 1955 (65 y.o. F) Treating RN: Hansel Feinstein Primary Care Takerra Lupinacci: Blane Ohara Other Clinician: Referring Payten Beaumier: Blane Ohara Treating Shannelle Alguire/Extender: Rowan Blase in Treatment: 17 Visit Information History Since Last Visit Added or deleted any medications: No Patient Arrived: Wheel Chair Had a fall or experienced change in No Arrival Time: 14:38 activities of daily living that may affect Accompanied By: daughter risk of falls: Transfer Assistance: EasyPivot Patient Lift Hospitalized since last visit: No Patient Identification Verified: Yes Has Dressing in Place as Prescribed: Yes Secondary Verification Process Completed: Yes Pain Present Now: Yes Patient Requires Transmission-Based No Precautions: Patient Has Alerts: Yes Patient Alerts: Patient on Blood Thinner DIABETIC Electronic Signature(s) Signed: 02/11/2021 11:12:12 AM By: Hansel Feinstein Entered By: Hansel Feinstein on 02/07/2021 14:41:02 [name] ([id number]) -------------------------------------------------------------------------------- Clinic Level of Care Assessment Details Patient Name: [name] Date of Service: 02/07/2021 2:30 PM Medical Record Number: [id number] Patient Account Number: [id number] Date of Birth/Sex: 06/19/1956 (64 y.o. F) Treating RN: Rogers Blocker Primary Care Wafaa Deemer: Blane Ohara Other Clinician: Referring Dorethea Strubel: Blane Ohara Treating Manda Holstad/Extender: Rowan Blase in Treatment: 17 Clinic Level of Care Assessment Items TOOL 4 Quantity Score X - Use when only an EandM is performed on FOLLOW-UP visit 1 0 ASSESSMENTS - Nursing Assessment / Reassessment X - Reassessment of Co-morbidities (includes updates in patient  status) 1 10 X- 1 5 Reassessment of Adherence to Treatment Plan ASSESSMENTS - Wound and Skin Assessment / Reassessment []  - Simple Wound Assessment / Reassessment - one wound 0 X- 4 5 Complex Wound Assessment / Reassessment - multiple wounds []  - 0 Dermatologic / Skin Assessment (not related to wound area) ASSESSMENTS - Focused Assessment []  - Circumferential Edema Measurements - multi extremities 0 []  - 0 Nutritional Assessment / Counseling / Intervention []  - 0 Lower Extremity Assessment (monofilament, tuning fork, pulses) []  - 0 Peripheral Arterial Disease Assessment (using hand held doppler) ASSESSMENTS - Ostomy and/or Continence Assessment and Care []  - Incontinence Assessment and Management 0 []  - 0 Ostomy Care Assessment and Management (repouching, etc.) PROCESS - Coordination of Care X - Simple Patient / Family Education for ongoing care 1 15 []  - 0 Complex (extensive) Patient / Family Education for ongoing care []  - 0 Staff obtains Chiropractor, Records, Test Results / Process Orders []  - 0 Staff telephones HHA, Nursing Homes / Clarify orders / etc []  - 0 Routine Transfer to another Facility (non-emergent condition) []  - 0 Routine Hospital Admission (non-emergent condition) []  - 0 New Admissions / Manufacturing engineer / Ordering NPWT, Apligraf, etc. []  - 0 Emergency Hospital Admission (emergent condition) X- 1 10 Simple Discharge Coordination []  - 0 Complex (extensive) Discharge Coordination PROCESS - Special Needs []  - Pediatric / Minor Patient Management 0 []  - 0 Isolation Patient Management []  - 0 Hearing / Language / Visual special needs []  - 0 Assessment of Community assistance (transportation, D/C planning, etc.) []  - 0 Additional assistance / Altered mentation []  - 0 Support Surface(s) Assessment (bed, cushion, seat, etc.) INTERVENTIONS - Wound Cleansing / Measurement Ament, Shadell ([id number]) []  - 0 Simple Wound Cleansing - one wound X- 4  5 Complex Wound Cleansing - multiple wounds X- 1 5 Wound Imaging (photographs - any number of wounds) []  - 0 Wound Tracing (instead of photographs) []  - 0 Simple Wound Measurement -  one wound X- 4 5 Complex Wound Measurement - multiple wounds INTERVENTIONS - Wound Dressings []  - Small Wound Dressing one or multiple wounds 0 X- 1 15 Medium Wound Dressing one or multiple wounds []  - 0 Large Wound Dressing one or multiple wounds []  - 0 Application of Medications - topical []  - 0 Application of Medications - injection INTERVENTIONS - Miscellaneous []  - External ear exam 0 []  - 0 Specimen Collection (cultures, biopsies, blood, body fluids, etc.) []  - 0 Specimen(s) / Culture(s) sent or taken to Lab for analysis []  - 0 Patient Transfer (multiple staff / / Similar devices) []  - 0 Simple Staple / Suture removal (25 or less) []  - 0 Complex Staple / Suture removal (26 or more) []  - 0 Hypo / Hyperglycemic Management (close monitor of Blood Glucose) []  - 0 Ankle / Brachial Index (ABI) - do not check if billed separately X- 1 5 Vital Signs Has the patient been seen at the hospital within the last three years: Yes Total Score: 125 Level Of Care: New/Established - Level 4 Electronic Signature(s) Signed: 02/07/2021 3:48:46 PM By: , RN Entered By: , on 02/07/2021 15:17:10 (Nurse, adult) -------------------------------------------------------------------------------- Encounter Discharge Information Details Patient Name: Date of Service: 02/07/2021 2:30 PM Medical Record Number: Patient Account Number: Date of Birth/Sex: September 03, 1955 (64 y.o. F) Treating RN: Dondra Prader Primary Care Seeley Hissong: Phillis Haggis Other Clinician: Referring Kanya Potteiger: Dondra Prader Treating Florestine Carmical/Extender: 02/09/2021 in Treatment: 17 Encounter Discharge Information Items Discharge Condition:  Stable Ambulatory Status: Wheelchair Discharge Destination: Home Transportation: Private Auto Accompanied By: daughter Schedule Follow-up Appointment: Yes Clinical Summary of Care: Electronic Signature(s) Signed: 02/11/2021 11:12:12 AM By: [name] Entered By: 02/09/2021 on 02/07/2021 15:35:57 [id number] (02/11/1956) -------------------------------------------------------------------------------- Lower Extremity Assessment Details Patient Name: 11-18-1990 Date of Service: 02/07/2021 2:30 PM Medical Record Number: Blane Ohara Patient Account Number: Blane Ohara Date of Birth/Sex: 09/29/1955 (64 y.o. F) Treating RN: 02/13/2021 Primary Care Tasnia Spegal: Hansel Feinstein Other Clinician: Referring Lexany Belknap: Hansel Feinstein Treating Cadyn Rodger/Extender: 02/09/2021 Weeks in Treatment: 17 Edema Assessment Assessed: [Left: Yes] [name]: Yes] [Left: Edema] [Right: :] Calf Left: Right: Point of Measurement: 40 cm From Medial Instep 44 cm 43 cm Ankle Left: Right: Point of Measurement: 10 cm From Medial Instep 23.5 cm 23 cm Vascular Assessment Pulses: Dorsalis Pedis Palpable: [Left:Yes] [Right:Yes] Electronic Signature(s) Signed: 02/11/2021 11:12:12 AM By: [name] Entered By: 02/09/2021 on 02/07/2021 15:05:46 [id number] (02/11/1956) -------------------------------------------------------------------------------- Multi Wound Chart Details Patient Name: 11-18-1990 Date of Service: 02/07/2021 2:30 PM Medical Record Number: Blane Ohara Patient Account Number: Blane Ohara Date of Birth/Sex: 12/06/1955 (64 y.o. F) Treating RN: 02/13/2021 Primary Care Murrell Elizondo: Hansel Feinstein Other Clinician: Referring Rhet Rorke: Hansel Feinstein Treating Lilee Aldea/Extender: 02/09/2021 in Treatment: 17 Vital Signs Height(in): 68 Pulse(bpm): 86 Weight(lbs): 160 Blood Pressure(mmHg): 107/76 Body Mass Index(BMI): 24 Temperature(F): 97.5 Respiratory Rate(breaths/min):  18 Photos: Wound Location: Right, Distal Lower Leg Left Lower Leg Right, Medial Lower Leg Wounding Event: Gradually Appeared Gradually Appeared Gradually Appeared 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 Extremity Extremity Comorbid History: Congestive Heart Failure, Congestive Heart Failure, Congestive Heart Failure, Hypertension, Type II Diabetes Hypertension, Type II Diabetes Hypertension, Type II Diabetes Date Acquired: 08/26/2019 08/26/2019 02/07/2020 Weeks of Treatment: 17 17 17  Wound Status: Open Open Open Measurements L x W x D (cm) 1.2x1.2x0.2 2.4x3x0.2 1.8x2x0.2 Area (  cm) : 1.131 5.655 2.827 Volume (cm) : 0.226 1.131 0.565 % Reduction in Area: 80.20% 25.00% 81.30% % Reduction in Volume: 90.10% 50.00% 81.30% Undermining: No N/A No Classification: Grade 2 Grade 2 Grade 2 Exudate Amount: Medium Medium Medium Exudate Type: Serosanguineous Serous Serous Exudate Color: red, brown amber amber Wound Margin: N/A N/A Flat and Intact Granulation Amount: Medium (34-66%) Small (1-33%) Medium (34-66%) Granulation Quality: Red, Pink, Pale Red, Pink Pink, Pale Necrotic Amount: Medium (34-66%) Small (1-33%) Medium (34-66%) Necrotic Tissue: Adherent Slough Adherent Slough Eschar, Adherent Slough Exposed Structures: Fat Layer (Subcutaneous Tissue): Fat Layer (Subcutaneous Tissue): Fat Layer (Subcutaneous Tissue): Yes Yes Yes Fascia: No Fascia: No Fascia: No Tendon: No Tendon: No Tendon: No Muscle: No Muscle: No Muscle: No Joint: No Joint: No Joint: No Bone: No Bone: No Bone: No Epithelialization: Small (1-33%) Medium (34-66%) Small (1-33%) Wound Number: 6 8 N/A Photos: No Photos N/A ASLEY, MAPA ([id number]) Wound Location: Left, Medial Lower Leg Medial Abdomen - Lower Quadrant N/A Wounding Event: Gradually Appeared Gradually Appeared N/A Primary Etiology: Diabetic Wound/Ulcer of the  Lower Trauma, Other N/A Extremity Comorbid History: N/A Congestive Heart Failure, N/A Hypertension, Type II Diabetes Date Acquired: 02/07/2020 11/27/2020 N/A Weeks of Treatment: 17 8 N/A Wound Status: Healed - Epithelialized Open N/A Measurements L x W x D (cm) 0x0x0 2x1x1 N/A Area (cm) : 0 1.571 N/A Volume (cm) : 0 1.571 N/A % Reduction in Area: 100.00% 68.70% N/A % Reduction in Volume: 100.00% 47.90% N/A Starting Position 1 (o'clock): 7 Ending Position 1 (o'clock): 12 Maximum Distance 1 (cm): 2 Undermining: N/A Yes N/A Classification: Grade 2 Full Thickness Without Exposed N/A Support Structures Exudate Amount: N/A Large N/A Exudate Type: N/A Serosanguineous N/A Exudate Color: N/A red, brown N/A Wound Margin: N/A N/A N/A Granulation Amount: N/A Small (1-33%) N/A Granulation Quality: N/A Pink N/A Necrotic Amount: N/A Large (67-100%) N/A Necrotic Tissue: N/A Adherent Slough N/A Exposed Structures: N/A Fat Layer (Subcutaneous Tissue): N/A Yes Fascia: No Tendon: No Muscle: No Joint: No Bone: No Epithelialization: N/A None N/A Treatment Notes Electronic Signature(s) Signed: 02/07/2021 3:48:46 PM By: Phillis Haggis, Dondra Prader RN Entered By: Phillis Haggis, Dondra Prader on 02/07/2021 15:12:35 [name] ([id number]) -------------------------------------------------------------------------------- Multi-Disciplinary Care Plan Details Patient Name: [name] Date of Service: 02/07/2021 2:30 PM Medical Record Number: [id number] Patient Account Number: [id number] Date of Birth/Sex: [birth year] (64 y.o. F) Treating RN: Rogers Blocker Primary Care Kmari Brian: Blane Ohara Other Clinician: Referring Jequan Shahin: Blane Ohara Treating Danikah Budzik/Extender: Rowan Blase in Treatment: 17 Active Inactive Malignancy/Atypical Etiology Nursing Diagnoses: Knowledge deficit related to disease process and management of atypical ulcer etiology Knowledge deficit related to disease process  and management of malignancy Goals: Patient/caregiver will verbalize understanding of disease process and disease management of atypical ulcer etiology Date Initiated: 01/09/2021 Target Resolution Date: 01/24/2021 Goal Status: Active Patient/caregiver will verbalize understanding of disease process and disease management of malignancy Date Initiated: 01/09/2021 Target Resolution Date: 01/24/2021 Goal Status: Active Interventions: Assess patient and family medical history for signs and symptoms of malignancy/atypical etiology upon admission Provide education on atypical ulcer etiologies Provide education on malignant ulcerations Notes: Necrotic Tissue Nursing Diagnoses: Impaired tissue integrity related to necrotic/devitalized tissue Knowledge deficit related to management of necrotic/devitalized tissue Goals: Necrotic/devitalized tissue will be minimized in the wound bed Date Initiated: 01/09/2021 Target Resolution Date: 01/24/2021 Goal Status: Active Patient/caregiver will verbalize understanding of reason and process for debridement of necrotic tissue Date Initiated: 01/09/2021 Target Resolution Date: 01/24/2021 Goal Status: Active Interventions: Assess patient pain  level pre-, during and post procedure and prior to discharge Provide education on necrotic tissue and debridement process Treatment Activities: Apply topical anesthetic as ordered : 01/09/2021 Excisional debridement : 01/09/2021 Notes: Soft Tissue Infection Nursing Diagnoses: Impaired tissue integrity Knowledge deficit related to disease process and management Knowledge deficit related to home infection control: handwashing, handling of soiled dressings, supply storage Potential for infection: soft tissue Crihfield, Evony ([id number]) Goals: Patient will remain free of wound infection Date Initiated: 01/09/2021 Target Resolution Date: 01/24/2021 Goal Status: Active Patient/caregiver will verbalize understanding of or  measures to prevent infection and contamination in the home setting Date Initiated: 01/09/2021 Target Resolution Date: 01/24/2021 Goal Status: Active Patient's soft tissue infection will resolve Date Initiated: 01/09/2021 Target Resolution Date: 01/24/2021 Goal Status: Active Signs and symptoms of infection will be recognized early to allow for prompt treatment Date Initiated: 01/09/2021 Target Resolution Date: 01/24/2021 Goal Status: Active Interventions: Assess signs and symptoms of infection every visit Treatment Activities: Culture and sensitivity : 01/09/2021 Systemic antibiotics : 01/09/2021 Notes: Venous Leg Ulcer Nursing Diagnoses: Actual venous Insuffiency (use after diagnosis is confirmed) Knowledge deficit related to disease process and management Goals: Patient will maintain optimal edema control Date Initiated: 01/09/2021 Target Resolution Date: 01/24/2021 Goal Status: Active Interventions: Assess peripheral edema status every visit. Treatment Activities: Therapeutic compression applied : 01/09/2021 Notes: Wound/Skin Impairment Nursing Diagnoses: Impaired tissue integrity Knowledge deficit related to ulceration/compromised skin integrity Goals: Patient/caregiver will verbalize understanding of skin care regimen Date Initiated: 01/09/2021 Target Resolution Date: 01/24/2021 Goal Status: Active Interventions: Assess ulceration(s) every visit Treatment Activities: Referred to DME Sally-Anne Wamble for dressing supplies : 01/09/2021 Skin care regimen initiated : 01/09/2021 Notes: Electronic Signature(s) Signed: 02/07/2021 3:48:46 PM By: Phillis Haggis, Dondra Prader RN Entered By: Phillis Haggis, Dondra Prader on 02/07/2021 15:12:25 [name] ([id number]) [name] ([id number]) -------------------------------------------------------------------------------- Pain Assessment Details Patient Name: [name] Date of Service: 02/07/2021 2:30 PM Medical Record Number:  [id number] Patient Account Number: [id number] Date of Birth/Sex: [birth year] (64 y.o. F) Treating RN: Hansel Feinstein Primary Care Elna Radovich: Blane Ohara Other Clinician: Referring Reiley Keisler: Blane Ohara Treating Karthik Whittinghill/Extender: Rowan Blase in Treatment: 17 Active Problems Location of Pain Severity and Description of Pain Patient Has Paino Yes Site Locations Pain Location: Generalized Pain, Pain in Ulcers Rate the pain. Current Pain Level: 9 Pain Management and Medication Current Pain Management: Electronic Signature(s) Signed: 02/11/2021 11:12:12 AM By: Hansel Feinstein Entered By: Hansel Feinstein on 02/07/2021 14:43:48 [name] ([id number]) -------------------------------------------------------------------------------- Patient/Caregiver Education Details Patient Name: [name] Date of Service: 02/07/2021 2:30 PM Medical Record Number: [id number] Patient Account Number: [id number] Date of Birth/Gender: 1956/06/13 (64 y.o. F) Treating RN: Rogers Blocker Primary Care Physician: Blane Ohara Other Clinician: Referring Physician: Blane Ohara Treating Physician/Extender: Rowan Blase in Treatment: 17 Education Assessment Education Provided To: Patient Education Topics Provided Wound/Skin Impairment: Methods: Explain/Verbal Responses: State content correctly Electronic Signature(s) Signed: 02/07/2021 3:48:46 PM By: Phillis Haggis, Dondra Prader RN Entered By: Phillis Haggis, Dondra Prader on 02/07/2021 15:17:26 [name] ([id number]) -------------------------------------------------------------------------------- Wound Assessment Details Patient Name: [name] Date of Service: 02/07/2021 2:30 PM Medical Record Number: [id number] Patient Account Number: [id number] Date of Birth/Sex: [birth year] (64 y.o. F) Treating RN: Hansel Feinstein Primary Care Ender Rorke: Blane Ohara Other Clinician: Referring Rhonda Linan: Blane Ohara Treating Novalyn Lajara/Extender: Rowan Blase in Treatment: 17 Wound Status Wound Number: 3 Primary Etiology: Diabetic Wound/Ulcer of the Lower Extremity Wound Location: Right, Distal Lower Leg Wound Status: Open Wounding Event: Gradually Appeared Comorbid Congestive Heart Failure, Hypertension,  Type II History: Diabetes Date Acquired: 08/26/2019 Weeks Of Treatment: 17 Clustered Wound: No Photos Photo Uploaded By: Hansel Feinstein on 02/07/2021 15:01:48 Wound Measurements Length: (cm) 1.2 % Redu Width: (cm) 1.2 % Redu Depth: (cm) 0.2 Epithe Area: (cm) 1.131 Tunne Volume: (cm) 0.226 Under ction in Area: 80.2% ction in Volume: 90.1% lialization: Small (1-33%) ling: No mining: No Wound Description Classification: Grade 2 Foul Exudate Amount: Medium Sloug Exudate Type: Serosanguineous Exudate Color: red, brown Odor After Cleansing: No h/Fibrino Yes Wound Bed Granulation Amount: Medium (34-66%) Exposed Structure Granulation Quality: Red, Pink, Pale Fascia Exposed: No Necrotic Amount: Medium (34-66%) Fat Layer (Subcutaneous Tissue) Exposed: Yes Necrotic Quality: Adherent Slough Tendon Exposed: No Muscle Exposed: No Joint Exposed: No Bone Exposed: No Treatment Notes Wound #3 (Lower Leg) Wound Laterality: Right, Distal Cleanser Peri-Wound Care Topical Frazer, Earlisha ([id number]) Primary Dressing Prisma 4.34 (in) Discharge Instruction: Moisten w/normal saline or sterile water; Cover wound as directed. Do not remove from wound bed. Secondary Dressing ABD Pad 5x9 (in/in) Discharge Instruction: Cover with ABD pad Kerlix 4.5 x 4.1 (in/yd) Discharge Instruction: Apply Kerlix 4.5 x 4.1 (in/yd) as instructed Secured With 30M Medipore H Soft Cloth Surgical Tape, 2x2 (in/yd) Discharge Instruction: Secure kerlix Compression Wrap Paulino Door Wrap 4000 Discharge Instruction: Secure kerlix with compression wrap Compression Stockings Add-Ons Electronic Signature(s) Signed: 02/11/2021 11:12:12 AM By: Hansel Feinstein Entered By: Hansel Feinstein on 02/07/2021 14:51:54 [name] ([id number]) -------------------------------------------------------------------------------- Wound Assessment Details Patient Name: [name] Date of Service: 02/07/2021 2:30 PM Medical Record Number: [id number] Patient Account Number: [id number] Date of Birth/Sex: 12-May-1956 (64 y.o. F) Treating RN: Hansel Feinstein Primary Care Caspar Favila: Blane Ohara Other Clinician: Referring Keniesha Adderly: Blane Ohara Treating Meghana Tullo/Extender: Rowan Blase in Treatment: 17 Wound Status Wound Number: 4 Primary Etiology: Diabetic Wound/Ulcer of the Lower Extremity Wound Location: Left Lower Leg Wound Status: Open Wounding Event: Gradually Appeared Comorbid Congestive Heart Failure, Hypertension, Type II History: Diabetes Date Acquired: 08/26/2019 Weeks Of Treatment: 17 Clustered Wound: No Photos Photo Uploaded By: Hansel Feinstein on 02/07/2021 15:01:49 Wound Measurements Length: (cm) 2.4 % Redu Width: (cm) 3 % Redu Depth: (cm) 0.2 Epithe Area: (cm) 5.655 Volume: (cm) 1.131 ction in Area: 25% ction in Volume: 50% lialization: Medium (34-66%) Wound Description Classification: Grade 2 Foul O Exudate Amount: Medium Slough Exudate Type: Serous Exudate Color: amber dor After Cleansing: No /Fibrino Yes Wound Bed Granulation Amount: Small (1-33%) Exposed Structure Granulation Quality: Red, Pink Fascia Exposed: No Necrotic Amount: Small (1-33%) Fat Layer (Subcutaneous Tissue) Exposed: Yes Necrotic Quality: Adherent Slough Tendon Exposed: No Muscle Exposed: No Joint Exposed: No Bone Exposed: No Treatment Notes Wound #4 (Lower Leg) Wound Laterality: Left Cleanser Peri-Wound Care Topical Icenogle, Kayia ([id number]) Primary Dressing Prisma 4.34 (in) Discharge Instruction: Moisten w/normal saline or sterile water; Cover wound as directed. Do not remove from wound bed. Secondary Dressing ABD Pad 5x9  (in/in) Discharge Instruction: Cover with ABD pad Kerlix 4.5 x 4.1 (in/yd) Discharge Instruction: Apply Kerlix 4.5 x 4.1 (in/yd) as instructed Secured With 30M Medipore H Soft Cloth Surgical Tape, 2x2 (in/yd) Discharge Instruction: Secure kerlix Compression Wrap Paulino Door Wrap 4000 Discharge Instruction: Secure kerlix with compression wrap Compression Stockings Add-Ons Electronic Signature(s) Signed: 02/11/2021 11:12:12 AM By: Hansel Feinstein Entered By: Hansel Feinstein on 02/07/2021 14:50:33 [name] ([id number]) -------------------------------------------------------------------------------- Wound Assessment Details Patient Name: [name] Date of Service: 02/07/2021 2:30 PM Medical Record Number: [id number] Patient Account Number: [id number] Date of Birth/Sex: 1956/06/19 (64 y.o. F) Treating RN: Hansel Feinstein Primary  Care Cosette Prindle: Blane Ohara Other Clinician: Referring Marcelle Hepner: Blane Ohara Treating Tonyetta Berko/Extender: Rowan Blase in Treatment: 17 Wound Status Wound Number: 5 Primary Etiology: Diabetic Wound/Ulcer of the Lower Extremity Wound Location: Right, Medial Lower Leg Wound Status: Open Wounding Event: Gradually Appeared Comorbid Congestive Heart Failure, Hypertension, Type II History: Diabetes Date Acquired: 02/07/2020 Weeks Of Treatment: 17 Clustered Wound: No Photos Photo Uploaded By: Hansel Feinstein on 02/07/2021 15:03:39 Wound Measurements Length: (cm) 1.8 % Redu Width: (cm) 2 % Redu Depth: (cm) 0.2 Epithe Area: (cm) 2.827 Tunne Volume: (cm) 0.565 Under ction in Area: 81.3% ction in Volume: 81.3% lialization: Small (1-33%) ling: No mining: No Wound Description Classification: Grade 2 Foul O Wound Margin: Flat and Intact Slough Exudate Amount: Medium Exudate Type: Serous Exudate Color: amber dor After Cleansing: No /Fibrino Yes Wound Bed Granulation Amount: Medium (34-66%) Exposed Structure Granulation Quality: Pink, Pale Fascia  Exposed: No Necrotic Amount: Medium (34-66%) Fat Layer (Subcutaneous Tissue) Exposed: Yes Necrotic Quality: Eschar, Adherent Slough Tendon Exposed: No Muscle Exposed: No Joint Exposed: No Bone Exposed: No Treatment Notes Wound #5 (Lower Leg) Wound Laterality: Right, Medial Cleanser Peri-Wound Care Topical Kroeker, Ileah ([id number]) Primary Dressing Prisma 4.34 (in) Discharge Instruction: Moisten w/normal saline or sterile water; Cover wound as directed. Do not remove from wound bed. Secondary Dressing ABD Pad 5x9 (in/in) Discharge Instruction: Cover with ABD pad Kerlix 4.5 x 4.1 (in/yd) Discharge Instruction: Apply Kerlix 4.5 x 4.1 (in/yd) as instructed Secured With 40M Medipore H Soft Cloth Surgical Tape, 2x2 (in/yd) Discharge Instruction: Secure kerlix Compression Wrap Paulino Door Wrap 4000 Discharge Instruction: Secure kerlix with compression wrap Compression Stockings Add-Ons Electronic Signature(s) Signed: 02/11/2021 11:12:12 AM By: Hansel Feinstein Entered By: Hansel Feinstein on 02/07/2021 14:52:43 [name] ([id number]) -------------------------------------------------------------------------------- Wound Assessment Details Patient Name: [name] Date of Service: 02/07/2021 2:30 PM Medical Record Number: [id number] Patient Account Number: [id number] Date of Birth/Sex: 09-09-1955 (64 y.o. F) Treating RN: Hansel Feinstein Primary Care Trinady Milewski: Blane Ohara Other Clinician: Referring Dantavious Snowball: Blane Ohara Treating Kloey Cazarez/Extender: Rowan Blase in Treatment: 17 Wound Status Wound Number: 6 Primary Etiology: Diabetic Wound/Ulcer of the Lower Extremity Wound Location: Left, Medial Lower Leg Wound Status: Healed - Epithelialized Wounding Event: Gradually Appeared Date Acquired: 02/07/2020 Weeks Of Treatment: 17 Clustered Wound: No Wound Measurements Length: (cm) 0 Width: (cm) 0 Depth: (cm) 0 Area: (cm) Volume: (cm) % Reduction in Area: 100% %  Reduction in Volume: 100% 0 0 Wound Description Classification: Grade 2 Treatment Notes Wound #6 (Lower Leg) Wound Laterality: Left, Medial Cleanser Peri-Wound Care Topical Primary Dressing Secondary Dressing Secured With Compression Wrap Compression Stockings Add-Ons Electronic Signature(s) Signed: 02/11/2021 11:12:12 AM By: Hansel Feinstein Entered By: Hansel Feinstein on 02/07/2021 14:48:39 [name] ([id number]) -------------------------------------------------------------------------------- Wound Assessment Details Patient Name: [name] Date of Service: 02/07/2021 2:30 PM Medical Record Number: [id number] Patient Account Number: [id number] Date of Birth/Sex: October 29, 1955 (64 y.o. F) Treating RN: Hansel Feinstein Primary Care Davide Risdon: Blane Ohara Other Clinician: Referring Kamali Sakata: Blane Ohara Treating Meer Reindl/Extender: Rowan Blase in Treatment: 17 Wound Status Wound Number: 8 Primary Etiology: Trauma, Other Wound Location: Medial Abdomen - Lower Quadrant Wound Status: Open Wounding Event: Gradually Appeared Comorbid Congestive Heart Failure, Hypertension, Type II History: Diabetes Date Acquired: 11/27/2020 Weeks Of Treatment: 8 Clustered Wound: No Photos Photo Uploaded By: Hansel Feinstein on 02/07/2021 15:03:40 Wound Measurements Length: (cm) 2 Width: (cm) 1 Depth: (cm) 1 Area: (cm) 1.571 Volume: (cm) 1.571 % Reduction in Area: 68.7% % Reduction in  Volume: 47.9% Epithelialization: None Tunneling: No Undermining: Yes Starting Position (o'clock): 7 Ending Position (o'clock): 12 Maximum Distance: (cm) 2 Wound Description Classification: Full Thickness Without Exposed Support Structures Exudate Amount: Large Exudate Type: Serosanguineous Exudate Color: red, brown Foul Odor After Cleansing: No Slough/Fibrino Yes Wound Bed Granulation Amount: Small (1-33%) Exposed Structure Granulation Quality: Pink Fascia Exposed: No Necrotic Amount: Large  (67-100%) Fat Layer (Subcutaneous Tissue) Exposed: Yes Necrotic Quality: Adherent Slough Tendon Exposed: No Muscle Exposed: No Joint Exposed: No Bone Exposed: No Treatment Notes Wound #8 (Abdomen - Lower Quadrant) Wound Laterality: Medial Cleanser Schroader, Mckinzee ([id number]) Peri-Wound Care Topical Gentamicin Discharge Instruction: Apply as directed by Tyia Binford. Primary Dressing Silvercel Small 2x2 (in/in) Discharge Instruction: Apply Silvercel Small 2x2 (in/in) as instructed Secondary Dressing ABD Pad 5x9 (in/in) Discharge Instruction: Cover with ABD pad Secured With under garment Discharge Instruction: secure in place with undergarment Compression Wrap Compression Stockings Add-Ons Electronic Signature(s) Signed: 02/11/2021 11:12:12 AM By: Hansel FeinsteinBishop, Joy Entered By: Hansel FeinsteinBishop, Joy on 02/07/2021 14:59:16 Candace CruiseASSIDY, Arliene ([id number]) -------------------------------------------------------------------------------- Vitals Details Patient Name: Candace CruiseASSIDY, Nayely Date of Service: 02/07/2021 2:30 PM Medical Record Number: [id number] Patient Account Number: [id number] Date of Birth/Sex: Jul 14, 1956 (64 y.o. F) Treating RN: Hansel FeinsteinBishop, Joy Primary Care Hien Cunliffe: Blane Oharaox , Kirsten Other Clinician: Referring Pearlena Ow: Blane Oharaox , Kirsten Treating Theodore Virgin/Extender: Rowan BlaseStone, Hoyt Weeks in Treatment: 17 Vital Signs Time Taken: 14:39 Temperature (F): 97.5 Height (in): 68 Pulse (bpm): 86 Weight (lbs): 160 Respiratory Rate (breaths/min): 18 Body Mass Index (BMI): 24.3 Blood Pressure (mmHg): 107/76 Reference Range: 80 - 120 mg / dl Electronic Signature(s) Signed: 02/11/2021 11:12:12 AM By: Hansel FeinsteinBishop, Joy Entered ByHansel Feinstein: Bishop, Joy on 02/07/2021 14:43:15

## 2021-02-12 ENCOUNTER — Other Ambulatory Visit: Payer: Self-pay

## 2021-02-12 ENCOUNTER — Ambulatory Visit: Payer: 59

## 2021-02-12 DIAGNOSIS — Z5181 Encounter for therapeutic drug level monitoring: Secondary | ICD-10-CM | POA: Diagnosis not present

## 2021-02-12 DIAGNOSIS — Z9229 Personal history of other drug therapy: Secondary | ICD-10-CM | POA: Diagnosis not present

## 2021-02-12 DIAGNOSIS — I639 Cerebral infarction, unspecified: Secondary | ICD-10-CM | POA: Diagnosis not present

## 2021-02-12 LAB — POCT INR: INR: 1.8 — AB (ref 2.0–3.0)

## 2021-02-12 NOTE — Patient Instructions (Signed)
Take 2 tablets tonight only and then Continue taking 1/2 tablet daily except 1 tablet on Sundays, Tuesdays and Thursdays Be consistent with greens Recheck in 6 weeks

## 2021-02-14 ENCOUNTER — Ambulatory Visit: Payer: 59 | Admitting: Physician Assistant

## 2021-02-21 ENCOUNTER — Other Ambulatory Visit: Payer: Self-pay

## 2021-02-21 ENCOUNTER — Encounter: Payer: 59 | Admitting: Physician Assistant

## 2021-02-21 DIAGNOSIS — I110 Hypertensive heart disease with heart failure: Secondary | ICD-10-CM | POA: Diagnosis not present

## 2021-02-21 DIAGNOSIS — I509 Heart failure, unspecified: Secondary | ICD-10-CM | POA: Diagnosis not present

## 2021-02-21 DIAGNOSIS — L97818 Non-pressure chronic ulcer of other part of right lower leg with other specified severity: Secondary | ICD-10-CM | POA: Diagnosis not present

## 2021-02-21 DIAGNOSIS — I890 Lymphedema, not elsewhere classified: Secondary | ICD-10-CM | POA: Diagnosis not present

## 2021-02-21 DIAGNOSIS — L98492 Non-pressure chronic ulcer of skin of other sites with fat layer exposed: Secondary | ICD-10-CM | POA: Diagnosis not present

## 2021-02-21 DIAGNOSIS — I429 Cardiomyopathy, unspecified: Secondary | ICD-10-CM | POA: Diagnosis not present

## 2021-02-21 DIAGNOSIS — E1151 Type 2 diabetes mellitus with diabetic peripheral angiopathy without gangrene: Secondary | ICD-10-CM | POA: Diagnosis not present

## 2021-02-21 DIAGNOSIS — E11622 Type 2 diabetes mellitus with other skin ulcer: Secondary | ICD-10-CM | POA: Diagnosis present

## 2021-02-21 DIAGNOSIS — L97828 Non-pressure chronic ulcer of other part of left lower leg with other specified severity: Secondary | ICD-10-CM | POA: Diagnosis not present

## 2021-02-21 DIAGNOSIS — L02211 Cutaneous abscess of abdominal wall: Secondary | ICD-10-CM | POA: Diagnosis not present

## 2021-02-21 DIAGNOSIS — Z7901 Long term (current) use of anticoagulants: Secondary | ICD-10-CM | POA: Diagnosis not present

## 2021-02-21 NOTE — Progress Notes (Signed)
DAURICE, OVANDO ([id number]) Visit Report for 02/21/2021 Chief Complaint Document Details Patient Name: Breanna, Bennett Date of Service: 02/21/2021 1:30 PM Medical Record Number: [id number] Patient Account Number: [id number] Date of Birth/Sex: 02-14-1956 (65 y.o. F) Treating RN: Rogers Blocker Primary Care Provider: Blane Ohara Other Clinician: Referring Provider: Blane Ohara Treating Provider/Extender: Rowan Blase in Treatment: 19 Information Obtained from: Patient Chief Complaint 10/10/2020; patient is here with wounds on her bilateral lower legs Electronic Signature(s) Signed: 02/21/2021 1:47:52 PM By: Lenda Kelp PA-C Entered By: Lenda Kelp on 02/21/2021 13:47:51 Stohr, [name] ([id number]) -------------------------------------------------------------------------------- HPI Details Patient Name: Breanna Bennett Date of Service: 02/21/2021 1:30 PM Medical Record Number: [id number] Patient Account Number: [id number] Date of Birth/Sex: August 27, 1955 (65 y.o. F) Treating RN: Rogers Blocker Primary Care Provider: Blane Ohara Other Clinician: Referring Provider: Blane Ohara Treating Provider/Extender: Rowan Blase in Treatment: 19 History of Present Illness HPI Description: ADMISSION 10/10/2020 This is a pleasant woman from [facility]. She is here accompanied by her daughter for review of wounds on her bilateral lower legs. Seems like these are chronic and have been present for the most part for about a year. She had compression stockings at one point but I am not sure that she is actually been wearing them. She has about 4 open areas on the left and more substantially for open areas on the right. She is a diabetic but is not on any current treatment. Her most other substantive problem is a severe cardiomyopathy with an ejection fraction less than 20%. Her daughter tells me that she runs a generally low blood pressure and she only takes 10 mg of Lasix  with an adjustment if her blood pressure is over a certain value giving it additional 10 mg. It does not seem like she has gained weight recently she says he is at 160 pounds and for her this is actually a reduction. They have been using Medihoney and Neosporin to this area. No recent compression wrap use. There are 4 open areas on the left and 4 open areas on the right. ABIs in our clinic were 1.35 on the right and 1.22 on the left 11/26/2020 on evaluation today patient appears to be doing poorly in regard to her lower extremities. With that being said the biggest issue currently seems to be really that she is having issues with extreme edema of the lower extremities and Dr. Leanord Hawking was concerned about the fact that to be honest the patient really needs to be seen by cardiology to see if there is anything they can do to help diurese her more as he was concerned about doing too strong of a compression. For that reason we have been doing Kerlix and Coban only for now patient has not seen her cardiologist we have not seen her since February 16 of this year. Unfortunately her wounds appear to be a bit worse than they were previous. 12/03/20 upon evaluation today patient appears to be doing well with regard to her wounds. She has been tolerating the dressing changes without complication. Fortunately there does not appear to be any signs of infection. She still has a tremendous amount of edema but this is being managed by her cardiologist in fact he did put her on some medications yesterday. This included increasing her furosemide to 20 mg 2 times a day. He is also lowering her blood pressure medication to compensate. Obviously this is a very fine balance with this patient. It was actually mentioned  in the note that the patient may need to consider palliative care. Obviously were trying to avoid making anything worse while still healing her wounds. 12/11/2020 upon evaluation today patient appears to be doing  well with regard to her wounds over the lower extremities. Unfortunately she has a new wound in the abdominal area as well underneath the periumbilical region which unfortunately she did not tell anybody about into I walked into the room. With that being said I think that we need to manage this as well today. She does have again some good granulation epithelization in regard to leg ulcers I feel like they are doing better. Although I am not really sure exactly what she is doing with the dressing she seems to be putting a lot of Band-Aids on the area. That is not really the ideal thing. 5/11; patient admitted to the clinic in February. She did not really resurface again until early April and Hoyte stones been seeing her since then. She had wounds on her bilateral lower extremities probably chronic venous with lymphedema. She has been doing reasonably well. However 3 weeks ago she came in the clinic with abdominal wound located in the right lower quadrant.. A culture of them showed Serratia. She is seeing received a course of Omnicef that she finished. She comes in with 2 open wounds 1 in the right mid lower abdominal quadrant a large necrotic wound with some depth and a smaller draining area in the right lower quadrant distally. Both of these look somewhat purulent. She is not complaining of a lot of pain thinks the wounds have been there for about 2 months but really not a lot of specific history here. We have been using silver alginate on the lower extremity wounds under compression and I believe silver alginate on the abdominal wounds 5/18; this is a patient I saw for the first time last week. She has wounds on her bilateral lower legs which look like chronic venous wounds with lymphedema. They have been doing reasonably well with silver collagen under the patient's own Farrow wraps. It came out today that she is really not allowing for family help with putting dressings on and as result she came in  with very little today I talked to her about this In the last 2 weeks we have discovered that a large abdominal mass. She has a CT scan on the 25th which should tell us the nature of this although I am concerned about an underlying malignancy. She also has a necrotic wound malodorous. Culture I did last week showed Enterobacter cloacae I and Pseudomonas. Prescribing antibiotics for her complicated by the fact that she is on Coumadin with an INR of 3 on 5/9 02/07/2021 upon evaluation today patient appears to be doing well with regard to her leg ulcers. This is great news. With that being said unfortunately the patient does still have an issue in her abdominal area. We do not know the results of any testing yet as she is not having her CT scan until later this afternoon. With that being said I do see evidence that she is to be honest still having a fairly significant area of induration in the abdominal area. The wound actually appears to be bigger and deeper here than when I saw it for certain and to be honest the draining sinus down below the still showing signs of draining quite frequently her daughter tells me. Subsequently the patient knows this is where she is hurting as well. From the  standpoint of her legs again everything appears to be doing quite well. 02/21/2021 upon evaluation today patient appears to be doing well with regard to her wounds. In fact the legs as well as the abdominal area appear to be doing better. I did review the CT scan this shows that there is some thickening of the skin and some varicosities noted but no obvious mass, fluid collection, or abscess was noted. This is good news. Overall the wound also seems to be doing better. With that being said X1 to make sure that were not missing anything and probably can make referral to Dr. Arita Miss who is a Human resources officer to make sure he does not see anything different that needs to be intervened in regard to at this  point. Electronic Signature(s) Signed: 02/21/2021 2:35:16 PM By: Welton Flakes, Nikyah ([id number]) Entered By: Lenda Kelp on 02/21/2021 14:35:16 RANESHA, VAL ([id number]) -------------------------------------------------------------------------------- Physical Exam Details Patient Name: Breanna Bennett Date of Service: 02/21/2021 1:30 PM Medical Record Number: [id number] Patient Account Number: [id number] Date of Birth/Sex: 11/05/1955 (65 y.o. F) Treating RN: Rogers Blocker Primary Care Provider: Blane Ohara Other Clinician: Referring Provider: Blane Ohara Treating Provider/Extender: Rowan Blase in Treatment: 77 Constitutional Well-nourished and well-hydrated in no acute distress. Respiratory normal breathing without difficulty. Psychiatric this patient is able to make decisions and demonstrates good insight into disease process. Alert and Oriented x 3. pleasant and cooperative. Notes Upon inspection patient's wound bed showed signs of good granulation epithelization at this point. There does not appear to be any signs of infection and overall I am very pleased with the results of the CT scan from the standpoint of there not being any mass or abscess noted no fluid collections. In general I think this is a positive sign. With that being said still with the significant skin thickening and everything else going on and I would still like to have a second opinion and have Dr. Arita Miss take a look at things and make sure there is nothing being missed here. The patient and her family member are in agreement with the plan. Electronic Signature(s) Signed: 02/21/2021 2:36:01 PM By: Lenda Kelp PA-C Entered By: Lenda Kelp on 02/21/2021 14:36:01 Breanna Bennett ([id number]) -------------------------------------------------------------------------------- Physician Orders Details Patient Name: Breanna Bennett Date of Service: 02/21/2021 1:30 PM Medical  Record Number: [id number] Patient Account Number: [id number] Date of Birth/Sex: 1955-08-29 (65 y.o. F) Treating RN: Rogers Blocker Primary Care Provider: Blane Ohara Other Clinician: Referring Provider: Blane Ohara Treating Provider/Extender: Rowan Blase in Treatment: 72 Verbal / Phone Orders: No Diagnosis Coding ICD-10 Coding Code Description E11.622 Type 2 diabetes mellitus with other skin ulcer I87.323 Chronic venous hypertension (idiopathic) with inflammation of bilateral lower extremity L97.828 Non-pressure chronic ulcer of other part of left lower leg with other specified severity L97.818 Non-pressure chronic ulcer of other part of right lower leg with other specified severity L02.211 Cutaneous abscess of abdominal wall L98.492 Non-pressure chronic ulcer of skin of other sites with fat layer exposed Unspecified open wound of abdominal wall, right lower quadrant without penetration into peritoneal cavity, subsequent S31.103D encounter Follow-up Appointments o Return Appointment in 1 week. Bathing/ Shower/ Hygiene o May shower; gently cleanse wound with antibacterial soap, rinse and pat dry prior to dressing wounds Edema Control - Lymphedema / Segmental Compressive Device / Other Bilateral Lower Extremities o Elevate, Exercise Daily and Avoid Standing for Long Periods of Time. o Elevate legs to the level of the  heart and pump ankles as often as possible o Elevate leg(s) parallel to the floor when sitting. Wound Treatment Wound #3 - Lower Leg Wound Laterality: Right, Distal Cleanser: Normal Saline 3 x Per Week/30 Days Discharge Instructions: Wash your hands with soap and water. Remove old dressing, discard into plastic bag and place into trash. Cleanse the wound with Normal Saline prior to applying a clean dressing using gauze sponges, not tissues or cotton balls. Do not scrub or use excessive force. Pat dry using gauze sponges, not tissue or cotton  balls. Primary Dressing: Prisma 4.34 (in) 3 x Per Week/30 Days Discharge Instructions: Moisten w/normal saline or sterile water; Cover wound as directed. Do not remove from wound bed. Secondary Dressing: ABD Pad 5x9 (in/in) (Generic) 3 x Per Week/30 Days Discharge Instructions: Cover with ABD pad Secondary Dressing: Kerlix 4.5 x 4.1 (in/yd) (Generic) 3 x Per Week/30 Days Discharge Instructions: Apply Kerlix 4.5 x 4.1 (in/yd) as instructed Secured With: 48M Medipore H Soft Cloth Surgical Tape, 2x2 (in/yd) (Generic) 3 x Per Week/30 Days Discharge Instructions: Secure kerlix Compression Wrap: Jobst Farrow Wrap 4000 3 x Per Week/30 Days Discharge Instructions: Secure kerlix with compression wrap Wound #4 - Lower Leg Wound Laterality: Left Cleanser: Normal Saline 3 x Per Week/30 Days Discharge Instructions: Wash your hands with soap and water. Remove old dressing, discard into plastic bag and place into trash. Cleanse the wound with Normal Saline prior to applying a clean dressing using gauze sponges, not tissues or cotton balls. Do not scrub or use excessive force. Pat dry using gauze sponges, not tissue or cotton balls. Primary Dressing: Prisma 4.34 (in) 3 x Per Week/30 Days Discharge Instructions: Moisten w/normal saline or sterile water; Cover wound as directed. Do not remove from wound bed. TABBETHA, KUTSCHER ([id number]) Secondary Dressing: ABD Pad 5x9 (in/in) (Generic) 3 x Per Week/30 Days Discharge Instructions: Cover with ABD pad Secondary Dressing: Kerlix 4.5 x 4.1 (in/yd) (Generic) 3 x Per Week/30 Days Discharge Instructions: Apply Kerlix 4.5 x 4.1 (in/yd) as instructed Secured With: 48M Medipore H Soft Cloth Surgical Tape, 2x2 (in/yd) (Generic) 3 x Per Week/30 Days Discharge Instructions: Secure kerlix Compression Wrap: Jobst Farrow Wrap 4000 3 x Per Week/30 Days Discharge Instructions: Secure kerlix with compression wrap Wound #5 - Lower Leg Wound Laterality: Right, Medial Cleanser:  Normal Saline 3 x Per Week/30 Days Discharge Instructions: Wash your hands with soap and water. Remove old dressing, discard into plastic bag and place into trash. Cleanse the wound with Normal Saline prior to applying a clean dressing using gauze sponges, not tissues or cotton balls. Do not scrub or use excessive force. Pat dry using gauze sponges, not tissue or cotton balls. Primary Dressing: Prisma 4.34 (in) 3 x Per Week/30 Days Discharge Instructions: Moisten w/normal saline or sterile water; Cover wound as directed. Do not remove from wound bed. Secondary Dressing: ABD Pad 5x9 (in/in) (Generic) 3 x Per Week/30 Days Discharge Instructions: Cover with ABD pad Secondary Dressing: Kerlix 4.5 x 4.1 (in/yd) (Generic) 3 x Per Week/30 Days Discharge Instructions: Apply Kerlix 4.5 x 4.1 (in/yd) as instructed Secured With: 48M Medipore H Soft Cloth Surgical Tape, 2x2 (in/yd) (Generic) 3 x Per Week/30 Days Discharge Instructions: Secure kerlix Compression Wrap: Jobst Farrow Wrap 4000 3 x Per Week/30 Days Discharge Instructions: Secure kerlix with compression wrap Wound #8 - Abdomen - Lower Quadrant Wound Laterality: Medial Cleanser: Normal Saline 1 x Per Day/30 Days Discharge Instructions: Wash your hands with soap and water. Remove old dressing, discard  into plastic bag and place into trash. Cleanse the wound with Normal Saline prior to applying a clean dressing using gauze sponges, not tissues or cotton balls. Do not scrub or use excessive force. Pat dry using gauze sponges, not tissue or cotton balls. Topical: Gentamicin 1 x Per Day/30 Days Discharge Instructions: Apply as directed by provider. Primary Dressing: Silvercel Small 2x2 (in/in) (Generic) 1 x Per Day/30 Days Discharge Instructions: Apply Silvercel Small 2x2 (in/in) as instructed Secondary Dressing: ABD Pad 5x9 (in/in) (Generic) 1 x Per Day/30 Days Discharge Instructions: Cover with ABD pad Secured With: under garment (Generic) 1 x Per  Day/30 Days Discharge Instructions: secure in place with undergarment Consults o Plastic Surgery - Dr. Arnetha Courser Patient Medications Allergies: No Known Drug Allergies Notifications Medication Indication Start End gentamicin 02/21/2021 DOSE topical 0.1 % cream - cream topical applied to the abdominal region with each dressing change 3 times per week for 30 days. YANET, BALLIET ([id number]) Electronic Signature(s) Signed: 02/21/2021 2:37:47 PM By: Lenda Kelp PA-C Entered By: Lenda Kelp on 02/21/2021 14:37:46 Breanna Bennett ([id number]) -------------------------------------------------------------------------------- Problem List Details Patient Name: Breanna Bennett Date of Service: 02/21/2021 1:30 PM Medical Record Number: [id number] Patient Account Number: [id number] Date of Birth/Sex: 09/19/1955 (65 y.o. F) Treating RN: Rogers Blocker Primary Care Provider: Blane Ohara Other Clinician: Referring Provider: Blane Ohara Treating Provider/Extender: Rowan Blase in Treatment: 19 Active Problems ICD-10 Encounter Code Description Active Date MDM Diagnosis E11.622 Type 2 diabetes mellitus with other skin ulcer 10/10/2020 No Yes I87.323 Chronic venous hypertension (idiopathic) with inflammation of bilateral 10/10/2020 No Yes lower extremity L97.828 Non-pressure chronic ulcer of other part of left lower leg with other 10/10/2020 No Yes specified severity L97.818 Non-pressure chronic ulcer of other part of right lower leg with other 10/10/2020 No Yes specified severity L02.211 Cutaneous abscess of abdominal wall 12/11/2020 No Yes L98.492 Non-pressure chronic ulcer of skin of other sites with fat layer exposed 12/11/2020 No Yes S31.103D Unspecified open wound of abdominal wall, right lower quadrant without 01/02/2021 No Yes penetration into peritoneal cavity, subsequent encounter Inactive Problems ICD-10 Code Description Active Date Inactive Date I50.22 Chronic systolic  (congestive) heart failure 10/10/2020 10/10/2020 Resolved Problems Electronic Signature(s) Signed: 02/21/2021 1:47:46 PM By: Lenda Kelp PA-C Entered By: Lenda Kelp on 02/21/2021 13:47:45 Breanna Bennett ([id number]) -------------------------------------------------------------------------------- Progress Note Details Patient Name: Breanna Bennett Date of Service: 02/21/2021 1:30 PM Medical Record Number: [id number] Patient Account Number: [id number] Date of Birth/Sex: [birth year] (65 y.o. F) Treating RN: Rogers Blocker Primary Care Provider: Blane Ohara Other Clinician: Referring Provider: Blane Ohara Treating Provider/Extender: Rowan Blase in Treatment: 62 Subjective Chief Complaint Information obtained from Patient 10/10/2020; patient is here with wounds on her bilateral lower legs History of Present Illness (HPI) ADMISSION 10/10/2020 This is a pleasant woman from [facility]. She is here accompanied by her daughter for review of wounds on her bilateral lower legs. Seems like these are chronic and have been present for the most part for about a year. She had compression stockings at one point but I am not sure that she is actually been wearing them. She has about 4 open areas on the left and more substantially for open areas on the right. She is a diabetic but is not on any current treatment. Her most other substantive problem is a severe cardiomyopathy with an ejection fraction less than 20%. Her daughter tells me that she runs a generally low blood pressure and she only takes  10 mg of Lasix with an adjustment if her blood pressure is over a certain value giving it additional 10 mg. It does not seem like she has gained weight recently she says he is at 160 pounds and for her this is actually a reduction. They have been using Medihoney and Neosporin to this area. No recent compression wrap use. There are 4 open areas on the left and 4 open areas on the  right. ABIs in our clinic were 1.35 on the right and 1.22 on the left 11/26/2020 on evaluation today patient appears to be doing poorly in regard to her lower extremities. With that being said the biggest issue currently seems to be really that she is having issues with extreme edema of the lower extremities and Dr. Leanord Hawkingobson was concerned about the fact that to be honest the patient really needs to be seen by cardiology to see if there is anything they can do to help diurese her more as he was concerned about doing too strong of a compression. For that reason we have been doing Kerlix and Coban only for now patient has not seen her cardiologist we have not seen her since February 16 of this year. Unfortunately her wounds appear to be a bit worse than they were previous. 12/03/20 upon evaluation today patient appears to be doing well with regard to her wounds. She has been tolerating the dressing changes without complication. Fortunately there does not appear to be any signs of infection. She still has a tremendous amount of edema but this is being managed by her cardiologist in fact he did put her on some medications yesterday. This included increasing her furosemide to 20 mg 2 times a day. He is also lowering her blood pressure medication to compensate. Obviously this is a very fine balance with this patient. It was actually mentioned in the note that the patient may need to consider palliative care. Obviously were trying to avoid making anything worse while still healing her wounds. 12/11/2020 upon evaluation today patient appears to be doing well with regard to her wounds over the lower extremities. Unfortunately she has a new wound in the abdominal area as well underneath the periumbilical region which unfortunately she did not tell anybody about into I walked into the room. With that being said I think that we need to manage this as well today. She does have again some good granulation  epithelization in regard to leg ulcers I feel like they are doing better. Although I am not really sure exactly what she is doing with the dressing she seems to be putting a lot of Band-Aids on the area. That is not really the ideal thing. 5/11; patient admitted to the clinic in February. She did not really resurface again until early April and Hoyte stones been seeing her since then. She had wounds on her bilateral lower extremities probably chronic venous with lymphedema. She has been doing reasonably well. However 3 weeks ago she came in the clinic with abdominal wound located in the right lower quadrant.. A culture of them showed Serratia. She is seeing received a course of Omnicef that she finished. She comes in with 2 open wounds 1 in the right mid lower abdominal quadrant a large necrotic wound with some depth and a smaller draining area in the right lower quadrant distally. Both of these look somewhat purulent. She is not complaining of a lot of pain thinks the wounds have been there for about 2 months but really not  a lot of specific history here. We have been using silver alginate on the lower extremity wounds under compression and I believe silver alginate on the abdominal wounds 5/18; this is a patient I saw for the first time last week. She has wounds on her bilateral lower legs which look like chronic venous wounds with lymphedema. They have been doing reasonably well with silver collagen under the patient's own Farrow wraps. It came out today that she is really not allowing for family help with putting dressings on and as result she came in with very little today I talked to her about this In the last 2 weeks we have discovered that a large abdominal mass. She has a CT scan on the 25th which should tell us the nature of this although I am concerned about an underlying malignancy. She also has a necrotic wound malodorous. Culture I did last week showed Enterobacter cloacae I and  Pseudomonas. Prescribing antibiotics for her complicated by the fact that she is on Coumadin with an INR of 3 on 5/9 02/07/2021 upon evaluation today patient appears to be doing well with regard to her leg ulcers. This is great news. With that being said unfortunately the patient does still have an issue in her abdominal area. We do not know the results of any testing yet as she is not having her CT scan until later this afternoon. With that being said I do see evidence that she is to be honest still having a fairly significant area of induration in the abdominal area. The wound actually appears to be bigger and deeper here than when I saw it for certain and to be honest the draining sinus down below the still showing signs of draining quite frequently her daughter tells me. Subsequently the patient knows this is where she is hurting as well. From the standpoint of her legs again everything appears to be doing quite well. 02/21/2021 upon evaluation today patient appears to be doing well with regard to her wounds. In fact the legs as well as the abdominal area appear to be doing better. I did review the CT scan this shows that there is some thickening of the skin and some varicosities noted but no obvious mass, fluid collection, or abscess was noted. This is good news. Overall the wound also seems to be doing better. With that being said X1 to make sure that were not missing anything and probably can make referral to Dr. Arita Miss who is a Human resources officer to make sure he does not see anything different that needs to be intervened in regard to at this point. JESSYKA, [place] ([id number]) Objective Constitutional Well-nourished and well-hydrated in no acute distress. Vitals Time Taken: 1:35 PM, Height: 68 in, Weight: 160 lbs, BMI: 24.3, Temperature: 98.2 F, Pulse: 79 bpm, Respiratory Rate: 18 breaths/min, Blood Pressure: 104/72 mmHg. Respiratory normal breathing without difficulty. Psychiatric this  patient is able to make decisions and demonstrates good insight into disease process. Alert and Oriented x 3. pleasant and cooperative. General Notes: Upon inspection patient's wound bed showed signs of good granulation epithelization at this point. There does not appear to be any signs of infection and overall I am very pleased with the results of the CT scan from the standpoint of there not being any mass or abscess noted no fluid collections. In general I think this is a positive sign. With that being said still with the significant skin thickening and everything else going on and I would still like  to have a second opinion and have Dr. Arita Miss take a look at things and make sure there is nothing being missed here. The patient and her family member are in agreement with the plan. Integumentary (Hair, Skin) Wound #3 status is Open. Original cause of wound was Gradually Appeared. The date acquired was: 08/26/2019. The wound has been in treatment 19 weeks. The wound is located on the Right,Distal Lower Leg. The wound measures 1.3cm length x 1.2cm width x 0.2cm depth; 1.225cm^2 area and 0.245cm^3 volume. There is Fat Layer (Subcutaneous Tissue) exposed. There is no tunneling or undermining noted. There is a medium amount of serosanguineous drainage noted. There is medium (34-66%) red, pink, pale granulation within the wound bed. There is a medium (34- 66%) amount of necrotic tissue within the wound bed including Adherent Slough. Wound #4 status is Open. Original cause of wound was Gradually Appeared. The date acquired was: 08/26/2019. The wound has been in treatment 19 weeks. The wound is located on the Left Lower Leg. The wound measures 3cm length x 3.5cm width x 0.2cm depth; 8.247cm^2 area and 1.649cm^3 volume. There is Fat Layer (Subcutaneous Tissue) exposed. There is no tunneling or undermining noted. There is a medium amount of serous drainage noted. There is small (1-33%) red, pink granulation within  the wound bed. There is a small (1-33%) amount of necrotic tissue within the wound bed including Adherent Slough. Wound #5 status is Open. Original cause of wound was Gradually Appeared. The date acquired was: 02/07/2020. The wound has been in treatment 19 weeks. The wound is located on the Right,Medial Lower Leg. The wound measures 1.8cm length x 2cm width x 0.2cm depth; 2.827cm^2 area and 0.565cm^3 volume. There is Fat Layer (Subcutaneous Tissue) exposed. There is no tunneling or undermining noted. There is a medium amount of serous drainage noted. The wound margin is flat and intact. There is medium (34-66%) pink, pale granulation within the wound bed. There is a medium (34-66%) amount of necrotic tissue within the wound bed including Eschar and Adherent Slough. Wound #8 status is Open. Original cause of wound was Gradually Appeared. The date acquired was: 11/27/2020. The wound has been in treatment 10 weeks. The wound is located on the Medial Abdomen - Lower Quadrant. The wound measures 2.5cm length x 0.7cm width x 1cm depth; 1.374cm^2 area and 1.374cm^3 volume. There is Fat Layer (Subcutaneous Tissue) exposed. There is no tunneling noted, however, there is undermining starting at 8:00 and ending at 12:00 with a maximum distance of 2cm. There is a large amount of serosanguineous drainage noted. There is small (1-33%) pink granulation within the wound bed. There is a large (67-100%) amount of necrotic tissue within the wound bed including Adherent Slough. 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 Bathing/ Shower/ Hygiene: May shower; gently cleanse wound with antibacterial soap, rinse and pat dry prior to dressing wounds Edema Control - Lymphedema / Segmental Compressive Device / Other: Elevate, Exercise Daily and Avoid Standing for Long Periods of  Time. Elevate legs to the level of the heart and pump ankles as often as possible Elevate leg(s) parallel to the floor when sitting. Consults ordered were: Plastic Surgery - Dr. Arnetha Courser The following medication(s) was prescribed: gentamicin topical 0.1 % cream cream topical applied to the abdominal region with each dressing change 3 times per week for 30 days. starting 02/21/2021 WOUND #3: - Lower Leg Wound Laterality: Right, Distal Cleanser: Normal Saline 3 x Per Week/30 Days Discharge Instructions: Wash your hands with soap and water. Remove old dressing, discard into plastic bag and place into trash. Cleanse the wound with Normal Saline prior to applying a clean dressing using gauze sponges, not tissues or cotton balls. Do not scrub or use excessive force. Pat dry using gauze sponges, not tissue or cotton balls. Primary Dressing: Prisma 4.34 (in) 3 x Per Week/30 Days Discharge Instructions: Moisten w/normal saline or sterile water; Cover wound as directed. Do not remove from wound bed. Secondary Dressing: ABD Pad 5x9 (in/in) (Generic) 3 x Per Week/30 Days Discharge Instructions: Cover with ABD pad Secondary Dressing: Kerlix 4.5 x 4.1 (in/yd) (Generic) 3 x Per Week/30 Days Discharge Instructions: Apply Kerlix 4.5 x 4.1 (in/yd) as instructed Secured With: 74M Medipore H Soft Cloth Surgical Tape, 2x2 (in/yd) (Generic) 3 x Per Week/30 Days Discharge Instructions: Secure kerlix Compression Wrap: Jobst Farrow Wrap 4000  3 x Per Week/30 Days Discharge Instructions: Secure kerlix with compression wrap WOUND #4: - Lower Leg Wound Laterality: Left Cleanser: Normal Saline 3 x Per Week/30 Days Discharge Instructions: Wash your hands with soap and water. Remove old dressing, discard into plastic bag and place into trash. Cleanse the wound with Normal Saline prior to applying a clean dressing using gauze sponges, not tissues or cotton balls. Do not scrub or use excessive force. Pat dry using gauze sponges, not tissue or cotton balls. Primary Dressing: Prisma 4.34 (in) 3 x Per Week/30 Days Discharge Instructions: Moisten w/normal saline or sterile water; Cover wound as directed. Do not remove from wound bed. Secondary Dressing: ABD Pad 5x9 (in/in) (Generic) 3 x Per Week/30 Days Discharge Instructions: Cover with ABD pad Secondary Dressing: Kerlix 4.5 x 4.1 (in/yd) (Generic) 3 x Per Week/30 Days Discharge Instructions: Apply Kerlix 4.5 x 4.1 (in/yd) as instructed Secured With: 74M Medipore H Soft Cloth Surgical Tape, 2x2 (in/yd) (Generic) 3 x Per Week/30 Days Discharge Instructions: Secure kerlix Compression Wrap: Jobst Farrow Wrap 4000 3 x Per Week/30 Days Discharge Instructions: Secure kerlix with compression wrap WOUND #5: - Lower Leg Wound Laterality: Right, Medial Cleanser: Normal Saline 3 x Per Week/30 Days Discharge Instructions: Wash your hands with soap and water. Remove old dressing, discard into plastic bag and place into trash. Cleanse the wound with Normal Saline prior to applying a clean dressing using gauze sponges, not tissues or cotton balls. Do not scrub or use excessive force. Pat dry using gauze sponges, not tissue or cotton balls. Primary Dressing: Prisma 4.34 (in) 3 x Per Week/30 Days Discharge Instructions: Moisten w/normal saline or sterile water; Cover wound as directed. Do not remove from wound bed. Secondary Dressing: ABD Pad 5x9 (in/in) (Generic) 3 x Per Week/30 Days Discharge  Instructions: Cover with ABD pad Secondary Dressing: Kerlix 4.5 x 4.1 (in/yd) (Generic) 3 x Per Week/30 Days Discharge Instructions: Apply Kerlix 4.5 x 4.1 (in/yd) as instructed Secured With: 74M Medipore H Soft Cloth Surgical Tape, 2x2 (in/yd) (Generic) 3 x Per Week/30 Days Discharge Instructions: Secure kerlix Compression Wrap: Jobst Farrow Wrap 4000 3 x  Per Week/30 Days Discharge Instructions: Secure kerlix with compression wrap WOUND #8: - Abdomen - Lower Quadrant Wound Laterality: Medial Cleanser: Normal Saline 1 x Per Day/30 Days Discharge Instructions: Wash your hands with soap and water. Remove old dressing, discard into plastic bag and place into trash. Cleanse the wound with Normal Saline prior to applying a clean dressing using gauze sponges, not tissues or cotton balls. Do not scrub or use excessive force. Pat dry using gauze sponges, not tissue or cotton balls. Topical: Gentamicin 1 x Per Day/30 Days Discharge Instructions: Apply as directed by provider. Primary Dressing: Silvercel Small 2x2 (in/in) (Generic) 1 x Per Day/30 Days Discharge Instructions: Apply Silvercel Small 2x2 (in/in) as instructed Secondary Dressing: ABD Pad 5x9 (in/in) (Generic) 1 x Per Day/30 Days Discharge Instructions: Cover with ABD pad Secured With: under garment (Generic) 1 x Per Day/30 Days Discharge Instructions: secure in place with undergarment Quackenbush, Zaynab ([id number]) 1. I would recommend currently that we going continue with the gentamicin followed by the silver alginate dressing to the abdominal area I think this is still the best way to go. 2. I am also can recommend that we continue with the silver collagen for the leg ulcers I think this is doing a good job. 3. I am also can recommend that we have the patient continue with the juxta lite compression wraps. She seems to be doing well as far as that is concerned and I think that with applying these appropriately from a size perspective we  will be able to continue to control her edema. We will give her a card today to be able to measure this more effectively. We will see patient back for reevaluation in 1 week here in the clinic. If anything worsens or changes patient will contact our office for additional recommendations. Electronic Signature(s) Signed: 02/21/2021 2:38:03 PM By: Lenda Kelp PA-C Entered By: Lenda Kelp on 02/21/2021 14:38:03 Breanna Bennett ([id number]) -------------------------------------------------------------------------------- SuperBill Details Patient Name: Breanna Bennett Date of Service: 02/21/2021 Medical Record Number: [id number] Patient Account Number: [id number] Date of Birth/Sex: [birth year] (65 y.o. F) Treating RN: Rogers Blocker Primary Care Provider: Blane Ohara Other Clinician: Referring Provider: Blane Ohara Treating Provider/Extender: Rowan Blase in Treatment: 19 Diagnosis Coding ICD-10 Codes Code Description E11.622 Type 2 diabetes mellitus with other skin ulcer I87.323 Chronic venous hypertension (idiopathic) with inflammation of bilateral lower extremity L97.828 Non-pressure chronic ulcer of other part of left lower leg with other specified severity L97.818 Non-pressure chronic ulcer of other part of right lower leg with other specified severity L02.211 Cutaneous abscess of abdominal wall L98.492 Non-pressure chronic ulcer of skin of other sites with fat layer exposed Unspecified open wound of abdominal wall, right lower quadrant without penetration into peritoneal cavity, subsequent S31.103D encounter Facility Procedures CPT4 Code: 25366440 Description: 99214 - WOUND CARE VISIT-LEV 4 EST PT Modifier: Quantity: 1 Physician Procedures CPT4 Code: [id number] Description: 99214 - WC PHYS LEVEL 4 - EST PT Modifier: Quantity: 1 CPT4 Code: Description: ICD-10 Diagnosis Description E11.622 Type 2 diabetes mellitus with other skin ulcer I87.323 Chronic venous  hypertension (idiopathic) with inflammation of bilateral low L97.828 Non-pressure chronic ulcer of other part of left lower leg with  other speci L97.818 Non-pressure chronic ulcer of other part of right lower leg with other spec Modifier: er extremity fied severity ified severity Quantity: Electronic Signature(s) Signed: 02/21/2021 2:38:15 PM By: Lenda Kelp PA-C Entered By: Lenda Kelp on 02/21/2021 14:38:15

## 2021-02-22 NOTE — Progress Notes (Signed)
REI, CONTEE ([id number]) Visit Report for 02/21/2021 Arrival Information Details Patient Name: Breanna Bennett, Breanna Bennett Date of Service: 02/21/2021 1:30 PM Medical Record Number: [id number] Patient Account Number: [id number] Date of Birth/Sex: 1956-03-17 (65 y.o. F) Treating RN: Yevonne Pax Primary Care Caedin Mogan: Blane Ohara Other Clinician: Referring Truth Wolaver: Blane Ohara Treating Tyeson Tanimoto/Extender: Rowan Blase in Treatment: 79 Visit Information History Since Last Visit All ordered tests and consults were completed: No Patient Arrived: Wheel Chair Added or deleted any medications: No Arrival Time: 13:30 Any new allergies or adverse reactions: No Accompanied By: daughter Had a fall or experienced change in No Transfer Assistance: None activities of daily living that may affect Patient Identification Verified: Yes risk of falls: Secondary Verification Process Completed: Yes Signs or symptoms of abuse/neglect since last visito No Patient Requires Transmission-Based No Hospitalized since last visit: No Precautions: Implantable device outside of the clinic excluding No Patient Has Alerts: Yes cellular tissue based products placed in the center Patient Alerts: Patient on Blood since last visit: Thinner Has Dressing in Place as Prescribed: Yes DIABETIC Pain Present Now: No Electronic Signature(s) Signed: 02/21/2021 6:25:03 PM By: Yevonne Pax RN Entered By: Yevonne Pax on 02/21/2021 13:35:53 Breanna Bennett ([id number]) -------------------------------------------------------------------------------- Clinic Level of Care Assessment Details Patient Name: Breanna Bennett Date of Service: 02/21/2021 1:30 PM Medical Record Number: [id number] Patient Account Number: [id number] Date of Birth/Sex: 07-Jan-1956 (65 y.o. F) Treating RN: Rogers Blocker Primary Care Beyounce Dickens: Blane Ohara Other Clinician: Referring Mitsuru Dault: Blane Ohara Treating Tailor Westfall/Extender: Rowan Blase in Treatment: 19 Clinic Level of Care Assessment Items TOOL 4 Quantity Score X - Use when only an EandM is performed on FOLLOW-UP visit 1 0 ASSESSMENTS - Nursing Assessment / Reassessment X - Reassessment of Co-morbidities (includes updates in patient status) 1 10 X- 1 5 Reassessment of Adherence to Treatment Plan ASSESSMENTS - Wound and Skin Assessment / Reassessment []  - Simple Wound Assessment / Reassessment - one wound 0 X- 4 5 Complex Wound Assessment / Reassessment - multiple wounds []  - 0 Dermatologic / Skin Assessment (not related to wound area) ASSESSMENTS - Focused Assessment []  - Circumferential Edema Measurements - multi extremities 0 []  - 0 Nutritional Assessment / Counseling / Intervention []  - 0 Lower Extremity Assessment (monofilament, tuning fork, pulses) []  - 0 Peripheral Arterial Disease Assessment (using hand held doppler) ASSESSMENTS - Ostomy and/or Continence Assessment and Care []  - Incontinence Assessment and Management 0 []  - 0 Ostomy Care Assessment and Management (repouching, etc.) PROCESS - Coordination of Care X - Simple Patient / Family Education for ongoing care 1 15 []  - 0 Complex (extensive) Patient / Family Education for ongoing care []  - 0 Staff obtains , Records, Test Results / Process Orders []  - 0 Staff telephones HHA, Nursing Homes / Clarify orders / etc []  - 0 Routine Transfer to another Facility (non-emergent condition) []  - 0 Routine Hospital Admission (non-emergent condition) []  - 0 New Admissions / / Ordering NPWT, Apligraf, etc. []  - 0 Emergency Hospital Admission (emergent condition) X- 1 10 Simple Discharge Coordination []  - 0 Complex (extensive) Discharge Coordination PROCESS - Special Needs []  - Pediatric / Minor Patient Management 0 []  - 0 Isolation Patient Management []  - 0 Hearing / Language / Visual special needs []  - 0 Assessment of Community assistance  (transportation, D/C planning, etc.) []  - 0 Additional assistance / Altered mentation []  - 0 Support Surface(s) Assessment (bed, cushion, seat, etc.) INTERVENTIONS - Wound Cleansing / Measurement Breanna Bennett, Breanna Bennett ( ) []  -  0 Simple Wound Cleansing - one wound X- 4 5 Complex Wound Cleansing - multiple wounds X- 1 5 Wound Imaging (photographs - any number of wounds) []  - 0 Wound Tracing (instead of photographs) []  - 0 Simple Wound Measurement - one wound X- 4 5 Complex Wound Measurement - multiple wounds INTERVENTIONS - Wound Dressings []  - Small Wound Dressing one or multiple wounds 0 X- 1 15 Medium Wound Dressing one or multiple wounds []  - 0 Large Wound Dressing one or multiple wounds []  - 0 Application of Medications - topical []  - 0 Application of Medications - injection INTERVENTIONS - Miscellaneous []  - External ear exam 0 []  - 0 Specimen Collection (cultures, biopsies, blood, body fluids, etc.) []  - 0 Specimen(s) / Culture(s) sent or taken to Lab for analysis []  - 0 Patient Transfer (multiple staff / Nurse, adult / Similar devices) []  - 0 Simple Staple / Suture removal (25 or less) []  - 0 Complex Staple / Suture removal (26 or more) []  - 0 Hypo / Hyperglycemic Management (close monitor of Blood Glucose) []  - 0 Ankle / Brachial Index (ABI) - do not check if billed separately X- 1 5 Vital Signs Has the patient been seen at the hospital within the last three years: Yes Total Score: 125 Level Of Care: New/Established - Level 4 Electronic Signature(s) Signed: 02/21/2021 4:24:12 PM By: Rogers Blocker RN Entered By: Rogers Blocker on 02/21/2021 14:30:58 Breanna Bennett ([id number]) -------------------------------------------------------------------------------- Encounter Discharge Information Details Patient Name: Breanna Bennett Date of Service: 02/21/2021 1:30 PM Medical Record Number: [id number] Patient Account Number: [id number] Date of Birth/Sex:  [birth year] (65 y.o. F) Treating RN: Hansel Feinstein Primary Care Rachal Dvorsky: Blane Ohara Other Clinician: Referring Isair Inabinet: Blane Ohara Treating Rilea Arutyunyan/Extender: Rowan Blase in Treatment: 93 Encounter Discharge Information Items Discharge Condition: Stable Ambulatory Status: Wheelchair Discharge Destination: Home Transportation: Private Auto Accompanied By: daughter Schedule Follow-up Appointment: Yes Clinical Summary of Care: Electronic Signature(s) Signed: 02/21/2021 4:45:13 PM By: Hansel Feinstein Entered By: Hansel Feinstein on 02/21/2021 14:51:20 Breanna Bennett ([id number]) -------------------------------------------------------------------------------- Lower Extremity Assessment Details Patient Name: Breanna Bennett Date of Service: 02/21/2021 1:30 PM Medical Record Number: [id number] Patient Account Number: [id number] Date of Birth/Sex: [birth year] (65 y.o. F) Treating RN: Hansel Feinstein Primary Care Shamyah Stantz: Blane Ohara Other Clinician: Referring Terence Bart: Blane Ohara Treating Kainan Patty/Extender: Allen Derry Weeks in Treatment: 19 Edema Assessment Assessed: [Left: Yes] [Right: Yes] Edema: [Left: Yes] [Right: Yes] Calf Left: Right: Point of Measurement: From Medial Instep 42 cm 44 cm Ankle Left: Right: Point of Measurement: From Medial Instep 22 cm 22 cm Electronic Signature(s) Signed: 02/21/2021 4:45:13 PM By: Hansel Feinstein Entered By: Hansel Feinstein on 02/21/2021 14:35:47 Breanna Bennett, Breanna Bennett ([id number]) -------------------------------------------------------------------------------- Multi Wound Chart Details Patient Name: Breanna Bennett Date of Service: 02/21/2021 1:30 PM Medical Record Number: [id number] Patient Account Number: [id number] Date of Birth/Sex: [birth year] (65 y.o. F) Treating RN: Rogers Blocker Primary Care Nautika Cressey: Blane Ohara Other Clinician: Referring Kalab Camps: Blane Ohara Treating Barack Nicodemus/Extender: Rowan Blase in Treatment: 19 Vital  Signs Height(in): 68 Pulse(bpm): 79 Weight(lbs): 160 Blood Pressure(mmHg): 104/72 Body Mass Index(BMI): 24 Temperature(F): 98.2 Respiratory Rate(breaths/min): 18 Photos: Wound Location: Right, Distal Lower Leg Left Lower Leg Right, Medial Lower Leg Wounding Event: Gradually Appeared Gradually Appeared Gradually Appeared 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 Extremity Extremity Comorbid History: Congestive Heart Failure, Congestive Heart Failure, Congestive Heart Failure, Hypertension, Type II Diabetes Hypertension, Type II Diabetes  Hypertension, Type II Diabetes Date Acquired: 08/26/2019 08/26/2019 02/07/2020 Weeks of Treatment: 19 19 19  Wound Status: Open Open Open Measurements L x W x D (cm) 1.3x1.2x0.2 3x3.5x0.2 1.8x2x0.2 Area (cm) : 1.225 8.247 2.827 Volume (cm) : 0.245 1.649 0.565 % Reduction in Area: 78.60% -9.40% 81.30% % Reduction in Volume: 89.30% 27.10% 81.30% Classification: Grade 2 Grade 2 Grade 2 Exudate Amount: Medium Medium Medium Exudate Type: Serosanguineous Serous Serous Exudate Color: red, brown amber amber Wound Margin: N/A N/A Flat and Intact Granulation Amount: Medium (34-66%) Small (1-33%) Medium (34-66%) Granulation Quality: Red, Pink, Pale Red, Pink Pink, Pale Necrotic Amount: Medium (34-66%) Small (1-33%) Medium (34-66%) Necrotic Tissue: Adherent Slough Adherent Slough Eschar, Adherent Slough Exposed Structures: Fat Layer (Subcutaneous Tissue): Fat Layer (Subcutaneous Tissue): Fat Layer (Subcutaneous Tissue): Yes Yes Yes Fascia: No Fascia: No Fascia: No Tendon: No Tendon: No Tendon: No Muscle: No Muscle: No Muscle: No Joint: No Joint: No Joint: No Bone: No Bone: No Bone: No Epithelialization: Small (1-33%) Medium (34-66%) Small (1-33%) Wound Number: 8 N/A N/A Photos: N/A N/A Breanna Bennett, Breanna Bennett ([id number]) Wound Location: Medial Abdomen - Lower  Quadrant N/A N/A Wounding Event: Gradually Appeared N/A N/A Primary Etiology: Trauma, Other N/A N/A Comorbid History: Congestive Heart Failure, N/A N/A Hypertension, Type II Diabetes Date Acquired: 11/27/2020 N/A N/A Weeks of Treatment: 10 N/A N/A Wound Status: Open N/A N/A Measurements L x W x D (cm) 2.5x0.7x1 N/A N/A Area (cm) : 1.374 N/A N/A Volume (cm) : 1.374 N/A N/A % Reduction in Area: 72.70% N/A N/A % Reduction in Volume: 54.40% N/A N/A Classification: Full Thickness Without Exposed N/A N/A Support Structures Exudate Amount: Large N/A N/A Exudate Type: Serosanguineous N/A N/A Exudate Color: red, brown N/A N/A Wound Margin: N/A N/A N/A Granulation Amount: Small (1-33%) N/A N/A Granulation Quality: Pink N/A N/A Necrotic Amount: Large (67-100%) N/A N/A Necrotic Tissue: Adherent Slough N/A N/A Exposed Structures: Fat Layer (Subcutaneous Tissue): N/A N/A Yes Fascia: No Tendon: No Muscle: No Joint: No Bone: No Epithelialization: None N/A N/A Treatment Notes Electronic Signature(s) Signed: 02/21/2021 4:24:12 PM By: Rogers Blocker RN Entered By: Rogers Blocker on 02/21/2021 14:23:40 Breanna Bennett ([id number]) -------------------------------------------------------------------------------- Multi-Disciplinary Care Plan Details Patient Name: Breanna Bennett Date of Service: 02/21/2021 1:30 PM Medical Record Number: [id number] Patient Account Number: [id number] Date of Birth/Sex: 03-24-1956 (65 y.o. F) Treating RN: Rogers Blocker Primary Care Leaha Cuervo: Blane Ohara Other Clinician: Referring Hasten Sweitzer: Blane Ohara Treating Anyssa Sharpless/Extender: Rowan Blase in Treatment: 59 Active Inactive Malignancy/Atypical Etiology Nursing Diagnoses: Knowledge deficit related to disease process and management of atypical ulcer etiology Knowledge deficit related to disease process and management of malignancy Goals: Patient/caregiver will verbalize understanding of disease  process and disease management of atypical ulcer etiology Date Initiated: 01/09/2021 Target Resolution Date: 01/24/2021 Goal Status: Active Patient/caregiver will verbalize understanding of disease process and disease management of malignancy Date Initiated: 01/09/2021 Target Resolution Date: 01/24/2021 Goal Status: Active Interventions: Assess patient and family medical history for signs and symptoms of malignancy/atypical etiology upon admission Provide education on atypical ulcer etiologies Provide education on malignant ulcerations Notes: Necrotic Tissue Nursing Diagnoses: Impaired tissue integrity related to necrotic/devitalized tissue Knowledge deficit related to management of necrotic/devitalized tissue Goals: Necrotic/devitalized tissue will be minimized in the wound bed Date Initiated: 01/09/2021 Target Resolution Date: 01/24/2021 Goal Status: Active Patient/caregiver will verbalize understanding of reason and process for debridement of necrotic tissue Date Initiated: 01/09/2021 Target Resolution Date: 01/24/2021 Goal Status: Active Interventions: Assess patient pain level pre-, during and post  procedure and prior to discharge Provide education on necrotic tissue and debridement process Treatment Activities: Apply topical anesthetic as ordered : 01/09/2021 Excisional debridement : 01/09/2021 Notes: Soft Tissue Infection Nursing Diagnoses: Impaired tissue integrity Knowledge deficit related to disease process and management Knowledge deficit related to home infection control: handwashing, handling of soiled dressings, supply storage Potential for infection: soft tissue Breanna Bennett, Breanna Bennett ([id number]) Goals: Patient will remain free of wound infection Date Initiated: 01/09/2021 Target Resolution Date: 01/24/2021 Goal Status: Active Patient/caregiver will verbalize understanding of or measures to prevent infection and contamination in the home setting Date Initiated: 01/09/2021 Target  Resolution Date: 01/24/2021 Goal Status: Active Patient's soft tissue infection will resolve Date Initiated: 01/09/2021 Target Resolution Date: 01/24/2021 Goal Status: Active Signs and symptoms of infection will be recognized early to allow for prompt treatment Date Initiated: 01/09/2021 Target Resolution Date: 01/24/2021 Goal Status: Active Interventions: Assess signs and symptoms of infection every visit Treatment Activities: Culture and sensitivity : 01/09/2021 Systemic antibiotics : 01/09/2021 Notes: Venous Leg Ulcer Nursing Diagnoses: Actual venous Insuffiency (use after diagnosis is confirmed) Knowledge deficit related to disease process and management Goals: Patient will maintain optimal edema control Date Initiated: 01/09/2021 Target Resolution Date: 01/24/2021 Goal Status: Active Interventions: Assess peripheral edema status every visit. Treatment Activities: Therapeutic compression applied : 01/09/2021 Notes: Wound/Skin Impairment Nursing Diagnoses: Impaired tissue integrity Knowledge deficit related to ulceration/compromised skin integrity Goals: Patient/caregiver will verbalize understanding of skin care regimen Date Initiated: 01/09/2021 Target Resolution Date: 01/24/2021 Goal Status: Active Interventions: Assess ulceration(s) every visit Treatment Activities: Referred to DME Samella Lucchetti for dressing supplies : 01/09/2021 Skin care regimen initiated : 01/09/2021 Notes: Electronic Signature(s) Signed: 02/21/2021 4:24:12 PM By: Rogers Blocker RN Entered By: Rogers Blocker on 02/21/2021 14:23:33 Breanna Bennett ([id number]) Breanna Bennett ([id number]) -------------------------------------------------------------------------------- Pain Assessment Details Patient Name: Breanna Bennett Date of Service: 02/21/2021 1:30 PM Medical Record Number: [id number] Patient Account Number: [id number] Date of Birth/Sex: April 06, 1956 (65 y.o. F) Treating RN: Yevonne Pax Primary Care  Deja Pisarski: Blane Ohara Other Clinician: Referring Jaecion Dempster: Blane Ohara Treating Dessie Tatem/Extender: Rowan Blase in Treatment: 19 Active Problems Location of Pain Severity and Description of Pain Patient Has Paino No Site Locations Pain Management and Medication Current Pain Management: Electronic Signature(s) Signed: 02/21/2021 6:25:03 PM By: Yevonne Pax RN Entered By: Yevonne Pax on 02/21/2021 13:36:42 Breanna Bennett ([id number]) -------------------------------------------------------------------------------- Patient/Caregiver Education Details Patient Name: Breanna Bennett Date of Service: 02/21/2021 1:30 PM Medical Record Number: [id number] Patient Account Number: [id number] Date of Birth/Gender: April 01, 1956 (65 y.o. F) Treating RN: Rogers Blocker Primary Care Physician: Blane Ohara Other Clinician: Referring Physician: Blane Ohara Treating Physician/Extender: Rowan Blase in Treatment: 55 Education Assessment Education Provided To: Patient Education Topics Provided Wound/Skin Impairment: Methods: Explain/Verbal Responses: State content correctly Electronic Signature(s) Signed: 02/21/2021 4:24:12 PM By: Rogers Blocker RN Entered By: Rogers Blocker on 02/21/2021 14:31:19 Breanna Bennett ([id number]) -------------------------------------------------------------------------------- Wound Assessment Details Patient Name: Breanna Bennett Date of Service: 02/21/2021 1:30 PM Medical Record Number: [id number] Patient Account Number: [id number] Date of Birth/Sex: June 13, 1956 (65 y.o. F) Treating RN: Yevonne Pax Primary Care Raylee Strehl: Blane Ohara Other Clinician: Referring Anjanette Gilkey: Blane Ohara Treating Charde Macfarlane/Extender: Rowan Blase in Treatment: 19 Wound Status Wound Number: 3 Primary Etiology: Diabetic Wound/Ulcer of the Lower Extremity Wound Location: Right, Distal Lower Leg Wound Status: Open Wounding Event: Gradually Appeared Comorbid  Congestive Heart Failure, Hypertension, Type II History: Diabetes Date Acquired: 08/26/2019 Weeks Of Treatment: 19 Clustered Wound: No Photos Wound Measurements Length: (  cm) 1.3 Width: (cm) 1.2 Depth: (cm) 0.2 Area: (cm) 1.225 Volume: (cm) 0.245 % Reduction in Area: 78.6% % Reduction in Volume: 89.3% Epithelialization: Small (1-33%) Tunneling: No Undermining: No Wound Description Classification: Grade 2 Exudate Amount: Medium Exudate Type: Serosanguineous Exudate Color: red, brown Foul Odor After Cleansing: No Slough/Fibrino Yes Wound Bed Granulation Amount: Medium (34-66%) Exposed Structure Granulation Quality: Red, Pink, Pale Fascia Exposed: No Necrotic Amount: Medium (34-66%) Fat Layer (Subcutaneous Tissue) Exposed: Yes Necrotic Quality: Adherent Slough Tendon Exposed: No Muscle Exposed: No Joint Exposed: No Bone Exposed: No Treatment Notes Wound #3 (Lower Leg) Wound Laterality: Right, Distal Cleanser Normal Saline Discharge Instruction: Wash your hands with soap and water. Remove old dressing, discard into plastic bag and place into trash. Cleanse the wound with Normal Saline prior to applying a clean dressing using gauze sponges, not tissues or cotton balls. Do not scrub or use excessive force. Pat dry using gauze sponges, not tissue or cotton balls. Breanna Bennett, Breanna Bennett ([id number]) Peri-Wound Care Topical Primary Dressing Prisma 4.34 (in) Discharge Instruction: Moisten w/normal saline or sterile water; Cover wound as directed. Do not remove from wound bed. Secondary Dressing ABD Pad 5x9 (in/in) Discharge Instruction: Cover with ABD pad Kerlix 4.5 x 4.1 (in/yd) Discharge Instruction: Apply Kerlix 4.5 x 4.1 (in/yd) as instructed Secured With 26M Medipore H Soft Cloth Surgical Tape, 2x2 (in/yd) Discharge Instruction: Secure kerlix Compression Wrap Paulino Door Wrap 4000 Discharge Instruction: Secure kerlix with compression wrap Compression  Stockings Add-Ons Electronic Signature(s) Signed: 02/21/2021 6:25:03 PM By: Yevonne Pax RN Entered By: Yevonne Pax on 02/21/2021 13:47:17 Breanna Bennett ([id number]) -------------------------------------------------------------------------------- Wound Assessment Details Patient Name: Breanna Bennett Date of Service: 02/21/2021 1:30 PM Medical Record Number: [id number] Patient Account Number: [id number] Date of Birth/Sex: May 07, 1956 (65 y.o. F) Treating RN: Yevonne Pax Primary Care Maely Clements: Blane Ohara Other Clinician: Referring Jalei Shibley: Blane Ohara Treating Deloros Beretta/Extender: Rowan Blase in Treatment: 19 Wound Status Wound Number: 4 Primary Etiology: Diabetic Wound/Ulcer of the Lower Extremity Wound Location: Left Lower Leg Wound Status: Open Wounding Event: Gradually Appeared Comorbid Congestive Heart Failure, Hypertension, Type II History: Diabetes Date Acquired: 08/26/2019 Weeks Of Treatment: 19 Clustered Wound: No Photos Wound Measurements Length: (cm) 3 Width: (cm) 3.5 Depth: (cm) 0.2 Area: (cm) 8.247 Volume: (cm) 1.649 % Reduction in Area: -9.4% % Reduction in Volume: 27.1% Epithelialization: Medium (34-66%) Tunneling: No Undermining: No Wound Description Classification: Grade 2 Exudate Amount: Medium Exudate Type: Serous Exudate Color: amber Foul Odor After Cleansing: No Slough/Fibrino Yes Wound Bed Granulation Amount: Small (1-33%) Exposed Structure Granulation Quality: Red, Pink Fascia Exposed: No Necrotic Amount: Small (1-33%) Fat Layer (Subcutaneous Tissue) Exposed: Yes Necrotic Quality: Adherent Slough Tendon Exposed: No Muscle Exposed: No Joint Exposed: No Bone Exposed: No Treatment Notes Wound #4 (Lower Leg) Wound Laterality: Left Cleanser Normal Saline Discharge Instruction: Wash your hands with soap and water. Remove old dressing, discard into plastic bag and place into trash. Cleanse the wound with Normal Saline prior to  applying a clean dressing using gauze sponges, not tissues or cotton balls. Do not scrub or use excessive force. Pat dry using gauze sponges, not tissue or cotton balls. Breanna Bennett, Breanna Bennett ([id number]) Peri-Wound Care Topical Primary Dressing Prisma 4.34 (in) Discharge Instruction: Moisten w/normal saline or sterile water; Cover wound as directed. Do not remove from wound bed. Secondary Dressing ABD Pad 5x9 (in/in) Discharge Instruction: Cover with ABD pad Kerlix 4.5 x 4.1 (in/yd) Discharge Instruction: Apply Kerlix 4.5 x 4.1 (in/yd) as instructed Secured With 26M Medipore H Soft  Cloth Surgical Tape, 2x2 (in/yd) Discharge Instruction: Secure kerlix Compression Wrap Paulino Door Wrap 4000 Discharge Instruction: Secure kerlix with compression wrap Compression Stockings Add-Ons Electronic Signature(s) Signed: 02/21/2021 6:25:03 PM By: Yevonne Pax RN Entered By: Yevonne Pax on 02/21/2021 13:47:37 Breanna Bennett ([id number]) -------------------------------------------------------------------------------- Wound Assessment Details Patient Name: Breanna Bennett Date of Service: 02/21/2021 1:30 PM Medical Record Number: [id number] Patient Account Number: [id number] Date of Birth/Sex: 1955/11/03 (65 y.o. F) Treating RN: Yevonne Pax Primary Care Raylin Winer: Blane Ohara Other Clinician: Referring Hans Rusher: Blane Ohara Treating Adelene Polivka/Extender: Rowan Blase in Treatment: 19 Wound Status Wound Number: 5 Primary Etiology: Diabetic Wound/Ulcer of the Lower Extremity Wound Location: Right, Medial Lower Leg Wound Status: Open Wounding Event: Gradually Appeared Comorbid Congestive Heart Failure, Hypertension, Type II History: Diabetes Date Acquired: 02/07/2020 Weeks Of Treatment: 19 Clustered Wound: No Photos Wound Measurements Length: (cm) 1.8 Width: (cm) 2 Depth: (cm) 0.2 Area: (cm) 2.827 Volume: (cm) 0.565 % Reduction in Area: 81.3% % Reduction in Volume:  81.3% Epithelialization: Small (1-33%) Tunneling: No Undermining: No Wound Description Classification: Grade 2 Wound Margin: Flat and Intact Exudate Amount: Medium Exudate Type: Serous Exudate Color: amber Foul Odor After Cleansing: No Slough/Fibrino Yes Wound Bed Granulation Amount: Medium (34-66%) Exposed Structure Granulation Quality: Pink, Pale Fascia Exposed: No Necrotic Amount: Medium (34-66%) Fat Layer (Subcutaneous Tissue) Exposed: Yes Necrotic Quality: Eschar, Adherent Slough Tendon Exposed: No Muscle Exposed: No Joint Exposed: No Bone Exposed: No Treatment Notes Wound #5 (Lower Leg) Wound Laterality: Right, Medial Cleanser Normal Saline Discharge Instruction: Wash your hands with soap and water. Remove old dressing, discard into plastic bag and place into trash. Cleanse the wound with Normal Saline prior to applying a clean dressing using gauze sponges, not tissues or cotton balls. Do not scrub or use excessive force. Pat dry using gauze sponges, not tissue or cotton balls. Breanna Bennett, Breanna Bennett ([id number]) Peri-Wound Care Topical Primary Dressing Prisma 4.34 (in) Discharge Instruction: Moisten w/normal saline or sterile water; Cover wound as directed. Do not remove from wound bed. Secondary Dressing ABD Pad 5x9 (in/in) Discharge Instruction: Cover with ABD pad Kerlix 4.5 x 4.1 (in/yd) Discharge Instruction: Apply Kerlix 4.5 x 4.1 (in/yd) as instructed Secured With 99M Medipore H Soft Cloth Surgical Tape, 2x2 (in/yd) Discharge Instruction: Secure kerlix Compression Wrap Paulino Door Wrap 4000 Discharge Instruction: Secure kerlix with compression wrap Compression Stockings Add-Ons Electronic Signature(s) Signed: 02/21/2021 6:25:03 PM By: Yevonne Pax RN Entered By: Yevonne Pax on 02/21/2021 13:48:03 Breanna Bennett ([id number]) -------------------------------------------------------------------------------- Wound Assessment Details Patient Name: Breanna Bennett Date of Service: 02/21/2021 1:30 PM Medical Record Number: [id number] Patient Account Number: [id number] Date of Birth/Sex: [birth year] (65 y.o. F) Treating RN: Yevonne Pax Primary Care [place] Seitzinger: Blane Ohara Other Clinician: Referring Goldie Tregoning: Blane Ohara Treating Hektor Huston/Extender: Rowan Blase in Treatment: 19 Wound Status Wound Number: 8 Primary Etiology: Trauma, Other Wound Location: Medial Abdomen - Lower Quadrant Wound Status: Open Wounding Event: Gradually Appeared Comorbid Congestive Heart Failure, Hypertension, Type II History: Diabetes Date Acquired: 11/27/2020 Weeks Of Treatment: 10 Clustered Wound: No Photos Wound Measurements Length: (cm) 2.5 Width: (cm) 0.7 Depth: (cm) 1 Area: (cm) 1.374 Volume: (cm) 1.374 % Reduction in Area: 72.7% % Reduction in Volume: 54.4% Epithelialization: None Tunneling: No Undermining: Yes Starting Position (o'clock): 8 Ending Position (o'clock): 12 Maximum Distance: (cm) 2 Wound Description Classification: Full Thickness Without Exposed Support Structures Exudate Amount: Large Exudate Type: Serosanguineous Exudate Color: red, brown Foul Odor After Cleansing: No Slough/Fibrino Yes Wound Bed Granulation Amount: Small (1-33%)  Exposed Structure Granulation Quality: Pink Fascia Exposed: No Necrotic Amount: Large (67-100%) Fat Layer (Subcutaneous Tissue) Exposed: Yes Necrotic Quality: Adherent Slough Tendon Exposed: No Muscle Exposed: No Joint Exposed: No Bone Exposed: No Treatment Notes Wound #8 (Abdomen - Lower Quadrant) Wound Laterality: Medial Cleanser Normal Saline Breanna Bennett, Breanna Bennett ([id number]) Discharge Instruction: Wash your hands with soap and water. Remove old dressing, discard into plastic bag and place into trash. Cleanse the wound with Normal Saline prior to applying a clean dressing using gauze sponges, not tissues or cotton balls. Do not scrub or use excessive force. Pat dry using gauze sponges,  not tissue or cotton balls. Peri-Wound Care Topical Gentamicin Discharge Instruction: Apply as directed by Vaidehi Braddy. Primary Dressing Silvercel Small 2x2 (in/in) Discharge Instruction: Apply Silvercel Small 2x2 (in/in) as instructed Secondary Dressing ABD Pad 5x9 (in/in) Discharge Instruction: Cover with ABD pad Secured With under garment Discharge Instruction: secure in place with undergarment Compression Wrap Compression Stockings Add-Ons Electronic Signature(s) Signed: 02/21/2021 4:24:12 PM By: Rogers BlockerSanchez , Kenia RN Signed: 02/21/2021 6:25:03 PM By: Yevonne PaxEpps, Carrie RN Entered By: Rogers BlockerSanchez , Kenia on 02/21/2021 14:26:37 Breanna Bennett, Breanna Bennett ([id number]) -------------------------------------------------------------------------------- Vitals Details Patient Name: Breanna Bennett, Breanna Bennett Date of Service: 02/21/2021 1:30 PM Medical Record Number: [id number] Patient Account Number: [id number] Date of Birth/Sex: [birth year] (65 y.o. F) Treating RN: Yevonne PaxEpps, Carrie Primary Care [place] Lust: Blane Oharaox , Kirsten Other Clinician: Referring Angelia Hazell: Blane Oharaox , Kirsten Treating [place] Stringfield/Extender: Rowan BlaseStone, Hoyt Weeks in Treatment: 19 Vital Signs Time Taken: 13:35 Temperature (F): 98.2 Height (in): 68 Pulse (bpm): 79 Weight (lbs): 160 Respiratory Rate (breaths/min): 18 Body Mass Index (BMI): 24.3 Blood Pressure (mmHg): 104/72 Reference Range: 80 - 120 mg / dl Electronic Signature(s) Signed: 02/21/2021 6:25:03 PM By: Yevonne PaxEpps, Carrie RN Entered By: Yevonne PaxEpps, Carrie on 02/21/2021 13:36:21

## 2021-02-28 ENCOUNTER — Ambulatory Visit: Payer: 59 | Admitting: Physician Assistant

## 2021-03-04 ENCOUNTER — Other Ambulatory Visit: Payer: Self-pay

## 2021-03-04 MED ORDER — FAMOTIDINE 40 MG PO TABS: 40.0000 mg | ORAL_TABLET | Freq: Every day | ORAL | 3 refills | Status: DC

## 2021-03-04 MED ORDER — ALLOPURINOL 100 MG PO TABS: 100.0000 mg | ORAL_TABLET | Freq: Two times a day (BID) | ORAL | 1 refills | Status: DC

## 2021-03-08 ENCOUNTER — Encounter: Payer: 59 | Attending: Physician Assistant | Admitting: Physician Assistant

## 2021-03-08 ENCOUNTER — Other Ambulatory Visit: Payer: Self-pay

## 2021-03-08 DIAGNOSIS — I509 Heart failure, unspecified: Secondary | ICD-10-CM | POA: Diagnosis not present

## 2021-03-08 DIAGNOSIS — Z7901 Long term (current) use of anticoagulants: Secondary | ICD-10-CM | POA: Diagnosis not present

## 2021-03-08 DIAGNOSIS — E1151 Type 2 diabetes mellitus with diabetic peripheral angiopathy without gangrene: Secondary | ICD-10-CM | POA: Diagnosis not present

## 2021-03-08 DIAGNOSIS — I890 Lymphedema, not elsewhere classified: Secondary | ICD-10-CM | POA: Insufficient documentation

## 2021-03-08 DIAGNOSIS — E11622 Type 2 diabetes mellitus with other skin ulcer: Secondary | ICD-10-CM | POA: Insufficient documentation

## 2021-03-08 DIAGNOSIS — L98492 Non-pressure chronic ulcer of skin of other sites with fat layer exposed: Secondary | ICD-10-CM | POA: Diagnosis not present

## 2021-03-08 DIAGNOSIS — I110 Hypertensive heart disease with heart failure: Secondary | ICD-10-CM | POA: Diagnosis not present

## 2021-03-08 DIAGNOSIS — L97828 Non-pressure chronic ulcer of other part of left lower leg with other specified severity: Secondary | ICD-10-CM | POA: Insufficient documentation

## 2021-03-08 DIAGNOSIS — L97818 Non-pressure chronic ulcer of other part of right lower leg with other specified severity: Secondary | ICD-10-CM | POA: Diagnosis not present

## 2021-03-08 DIAGNOSIS — L02211 Cutaneous abscess of abdominal wall: Secondary | ICD-10-CM | POA: Diagnosis not present

## 2021-03-08 NOTE — Progress Notes (Addendum)
Breanna Bennett ([id number]) Visit Report for 03/08/2021 Chief Complaint Document Details Patient Name: Breanna Bennett Date of Service: 03/08/2021 2:45 PM Medical Record Number: [id number] Patient Account Number: [id number] Date of Birth/Sex: 1955/12/28 (65 y.o. F) Treating RN: Primary Care Provider: Blane Oharaox , Kirsten Other Clinician: Referring Provider: Blane Oharaox , Kirsten Treating Provider/Extender: Rowan BlaseStone, Ryeleigh Santore Weeks in Treatment: 21 Information Obtained from: Patient Chief Complaint 10/10/2020; patient is here with wounds on her bilateral lower legs Electronic Signature(s) Signed: 03/08/2021 2:55:13 PM By: Lenda KelpStone III, Tashai Catino PA-C Entered By: Lenda KelpStone III, Adelei Scobey on 03/08/2021 14:55:13 Breanna Bennett ([id number]) -------------------------------------------------------------------------------- HPI Details Patient Name: Breanna Bennett Date of Service: 03/08/2021 2:45 PM Medical Record Number: [id number] Patient Account Number: [id number] Date of Birth/Sex: 1955/12/28 (65 y.o. F) Treating RN: Primary Care Provider: Blane Oharaox , Kirsten Other Clinician: Referring Provider: Blane Oharaox , Kirsten Treating Provider/Extender: Rowan BlaseStone, Jamar Weatherall Weeks in Treatment: 21 History of Present Illness HPI Description: ADMISSION 10/10/2020 This is a pleasant [facility]. She is here accompanied by her daughter for review of wounds on her bilateral lower legs. Seems like these are chronic and have been present for the most part for about a year. She had compression stockings at one point but I am not sure that she is actually been wearing them. She has about 4 open areas on the left and more substantially for open areas on the right. She is a diabetic but is not on any current treatment. Her most other substantive problem is a severe cardiomyopathy with an ejection fraction less than 20%. Her daughter tells me that she runs a generally low blood pressure and she only takes 10 mg of Lasix with an adjustment if her  blood pressure is over a certain value giving it additional 10 mg. It does not seem like she has gained weight recently she says he is at 160 pounds and for her this is actually a reduction. They have been using Medihoney and Neosporin to this area. No recent compression wrap use. There are 4 open areas on the left and 4 open areas on the right. ABIs in our clinic were 1.35 on the right and 1.22 on the left 11/26/2020 on evaluation today patient appears to be doing poorly in regard to her lower extremities. With that being said the biggest issue currently seems to be really that she is having issues with extreme edema of the lower extremities and Dr. Leanord Hawkingobson was concerned about the fact that to be honest the patient really needs to be seen by cardiology to see if there is anything they can do to help diurese her more as he was concerned about doing too strong of a compression. For that reason we have been doing Kerlix and Coban only for now patient has not seen her cardiologist we have not seen her since February 16 of this year. Unfortunately her wounds appear to be a bit worse than they were previous. 12/03/20 upon evaluation today patient appears to be doing well with regard to her wounds. She has been tolerating the dressing changes without complication. Fortunately there does not appear to be any signs of infection. She still has a tremendous amount of edema but this is being managed by her cardiologist in fact he did put her on some medications yesterday. This included increasing her furosemide to 20 mg 2 times a day. He is also lowering her blood pressure medication to compensate. Obviously this is a very fine balance with this patient. It was actually mentioned in the note that  the patient may need to consider palliative care. Obviously were trying to avoid making anything worse while still healing her wounds. 12/11/2020 upon evaluation today patient appears to be doing well with regard to her  wounds over the lower extremities. Unfortunately she has a new wound in the abdominal area as well underneath the periumbilical region which unfortunately she did not tell anybody about into I walked into the room. With that being said I think that we need to manage this as well today. She does have again some good granulation epithelization in regard to leg ulcers I feel like they are doing better. Although I am not really sure exactly what she is doing with the dressing she seems to be putting a lot of Band-Aids on the area. That is not really the ideal thing. 5/11; patient admitted to the clinic in February. She did not really resurface again until early April and Hoyte stones been seeing her since then. She had wounds on her bilateral lower extremities probably chronic venous with lymphedema. She has been doing reasonably well. However 3 weeks ago she came in the clinic with abdominal wound located in the right lower quadrant.. A culture of them showed Serratia. She is seeing received a course of Omnicef that she finished. She comes in with 2 open wounds 1 in the right mid lower abdominal quadrant a large necrotic wound with some depth and a smaller draining area in the right lower quadrant distally. Both of these look somewhat purulent. She is not complaining of a lot of pain thinks the wounds have been there for about 2 months but really not a lot of specific history here. We have been using silver alginate on the lower extremity wounds under compression and I believe silver alginate on the abdominal wounds 5/18; this is a patient I saw for the first time last week. She has wounds on her bilateral lower legs which look like chronic venous wounds with lymphedema. They have been doing reasonably well with silver collagen under the patient's own Farrow wraps. It came out today that she is really not allowing for family help with putting dressings on and as result she came in with very little today I  talked to her about this In the last 2 weeks we have discovered that a large abdominal mass. She has a CT scan on the 25th which should tell us the nature of this although I am concerned about an underlying malignancy. She also has a necrotic wound malodorous. Culture I did last week showed Enterobacter cloacae I and Pseudomonas. Prescribing antibiotics for her complicated by the fact that she is on Coumadin with an INR of 3 on 5/9 02/07/2021 upon evaluation today patient appears to be doing well with regard to her leg ulcers. This is great news. With that being said unfortunately the patient does still have an issue in her abdominal area. We do not know the results of any testing yet as she is not having her CT scan until later this afternoon. With that being said I do see evidence that she is to be honest still having a fairly significant area of induration in the abdominal area. The wound actually appears to be bigger and deeper here than when I saw it for certain and to be honest the draining sinus down below the still showing signs of draining quite frequently her daughter tells me. Subsequently the patient knows this is where she is hurting as well. From the standpoint of her legs  again everything appears to be doing quite well. 02/21/2021 upon evaluation today patient appears to be doing well with regard to her wounds. In fact the legs as well as the abdominal area appear to be doing better. I did review the CT scan this shows that there is some thickening of the skin and some varicosities noted but no obvious mass, fluid collection, or abscess was noted. This is good news. Overall the wound also seems to be doing better. With that being said X1 to make sure that were not missing anything and probably can make referral to Dr. Arita Miss who is a Human resources officer to make sure he does not see anything different that needs to be intervened in regard to at this point. 03/08/2021 upon evaluation today  patient appears to be doing well with regard to her wounds. In fact everything is looking a little better except for the right leg where it she has a tremendous amount of drainage noted. Fortunately there does not appear to be any signs of infection. She has been keeping the abdominal area clean and try to take care of this as best she can. Breanna Bennett ([id number]) Electronic Signature(s) Signed: 03/08/2021 3:23:03 PM By: Lenda Kelp PA-C Entered By: Lenda Kelp on 03/08/2021 15:23:03 Breanna Bennett ([id number]) -------------------------------------------------------------------------------- Physical Exam Details Patient Name: Breanna Bennett Date of Service: 03/08/2021 2:45 PM Medical Record Number: [id number] Patient Account Number: [id number] Date of Birth/Sex: [birth year] (65 y.o. F) Treating RN: Primary Care Provider: Blane Ohara Other Clinician: Referring Provider: Blane Ohara Treating Provider/Extender: Rowan Blase in Treatment: 21 Constitutional Well-nourished and well-hydrated in no acute distress. Respiratory normal breathing without difficulty. Psychiatric this patient is able to make decisions and demonstrates good insight into disease process. Alert and Oriented x 3. pleasant and cooperative. Notes Upon inspection patient's wound bed actually showed signs of good granulation epithelization at this point. There does not appear to be any evidence of infection which is great news and overall I am extremely pleased with where things stand. Fortunately there is no evidence of worsening in general though she does have a tremendous amount of drainage from the right lateral lower extremity I can see the clear fluid just dripped out as I am sitting in the room. Nonetheless I think that this may be something she needs to follow-up with her heart failure clinic with regard to. Obviously she has some definite fluid retention issues here. Electronic  Signature(s) Signed: 03/08/2021 3:23:41 PM By: Lenda Kelp PA-C Entered By: Lenda Kelp on 03/08/2021 15:23:41 Breanna Bennett ([id number]) -------------------------------------------------------------------------------- Physician Orders Details Patient Name: Breanna Bennett Date of Service: 03/08/2021 2:45 PM Medical Record Number: [id number] Patient Account Number: [id number] Date of Birth/Sex: 12/20/1955 (65 y.o. F) Treating RN: Rogers Blocker Primary Care Provider: Blane Ohara Other Clinician: Referring Provider: Blane Ohara Treating Provider/Extender: Rowan Blase in Treatment: 21 Verbal / Phone Orders: No Diagnosis Coding ICD-10 Coding Code Description E11.622 Type 2 diabetes mellitus with other skin ulcer I87.323 Chronic venous hypertension (idiopathic) with inflammation of bilateral lower extremity L97.828 Non-pressure chronic ulcer of other part of left lower leg with other specified severity L97.818 Non-pressure chronic ulcer of other part of right lower leg with other specified severity L02.211 Cutaneous abscess of abdominal wall L98.492 Non-pressure chronic ulcer of skin of other sites with fat layer exposed Unspecified open wound of abdominal wall, right lower quadrant without penetration into peritoneal cavity, subsequent S31.103D encounter Follow-up Appointments o Return Appointment in 1  week. Bathing/ Shower/ Hygiene o May shower; gently cleanse wound with antibacterial soap, rinse and pat dry prior to dressing wounds Edema Control - Lymphedema / Segmental Compressive Device / Other Bilateral Lower Extremities o Elevate, Exercise Daily and Avoid Standing for Long Periods of Time. o Elevate legs to the level of the heart and pump ankles as often as possible o Elevate leg(s) parallel to the floor when sitting. Wound Treatment Wound #3 - Lower Leg Wound Laterality: Right, Distal Cleanser: Normal Saline 3 x Per Week/30 Days Discharge  Instructions: Wash your hands with soap and water. Remove old dressing, discard into plastic bag and place into trash. Cleanse the wound with Normal Saline prior to applying a clean dressing using gauze sponges, not tissues or cotton balls. Do not scrub or use excessive force. Pat dry using gauze sponges, not tissue or cotton balls. Primary Dressing: Silvercel Small 2x2 (in/in) 3 x Per Week/30 Days Discharge Instructions: Apply Silvercel Small 2x2 (in/in) as instructed Secondary Dressing: ABD Pad 5x9 (in/in) (Generic) 3 x Per Week/30 Days Discharge Instructions: Cover with ABD pad Secondary Dressing: Kerlix 4.5 x 4.1 (in/yd) (Generic) 3 x Per Week/30 Days Discharge Instructions: Apply Kerlix 4.5 x 4.1 (in/yd) as instructed Secured With: 338M Medipore H Soft Cloth Surgical Tape, 2x2 (in/yd) (Generic) 3 x Per Week/30 Days Discharge Instructions: Secure kerlix Compression Wrap: Jobst Farrow Wrap 4000 3 x Per Week/30 Days Discharge Instructions: Secure kerlix with compression wrap Wound #4 - Lower Leg Wound Laterality: Left Cleanser: Normal Saline 3 x Per Week/30 Days Discharge Instructions: Wash your hands with soap and water. Remove old dressing, discard into plastic bag and place into trash. Cleanse the wound with Normal Saline prior to applying a clean dressing using gauze sponges, not tissues or cotton balls. Do not scrub or use excessive force. Pat dry using gauze sponges, not tissue or cotton balls. Primary Dressing: Silvercel Small 2x2 (in/in) 3 x Per Week/30 Days Discharge Instructions: Apply Silvercel Small 2x2 (in/in) as instructed Ahrendt, Colin ([id number]) Secondary Dressing: ABD Pad 5x9 (in/in) (Generic) 3 x Per Week/30 Days Discharge Instructions: Cover with ABD pad Secondary Dressing: Kerlix 4.5 x 4.1 (in/yd) (Generic) 3 x Per Week/30 Days Discharge Instructions: Apply Kerlix 4.5 x 4.1 (in/yd) as instructed Secured With: 338M Medipore H Soft Cloth Surgical Tape, 2x2 (in/yd)  (Generic) 3 x Per Week/30 Days Discharge Instructions: Secure kerlix Compression Wrap: Jobst Farrow Wrap 4000 3 x Per Week/30 Days Discharge Instructions: Secure kerlix with compression wrap Wound #5 - Lower Leg Wound Laterality: Right, Proximal Cleanser: Normal Saline 3 x Per Week/30 Days Discharge Instructions: Wash your hands with soap and water. Remove old dressing, discard into plastic bag and place into trash. Cleanse the wound with Normal Saline prior to applying a clean dressing using gauze sponges, not tissues or cotton balls. Do not scrub or use excessive force. Pat dry using gauze sponges, not tissue or cotton balls. Primary Dressing: Silvercel Small 2x2 (in/in) 3 x Per Week/30 Days Discharge Instructions: Apply Silvercel Small 2x2 (in/in) as instructed Secondary Dressing: ABD Pad 5x9 (in/in) (Generic) 3 x Per Week/30 Days Discharge Instructions: Cover with ABD pad Secondary Dressing: Kerlix 4.5 x 4.1 (in/yd) (Generic) 3 x Per Week/30 Days Discharge Instructions: Apply Kerlix 4.5 x 4.1 (in/yd) as instructed Secured With: 338M Medipore H Soft Cloth Surgical Tape, 2x2 (in/yd) (Generic) 3 x Per Week/30 Days Discharge Instructions: Secure kerlix Compression Wrap: Jobst Farrow Wrap 4000 3 x Per Week/30 Days Discharge Instructions: Secure kerlix with compression wrap Wound #  8 - Abdomen - Lower Quadrant Wound Laterality: Medial Cleanser: Normal Saline 1 x Per Day/30 Days Discharge Instructions: Wash your hands with soap and water. Remove old dressing, discard into plastic bag and place into trash. Cleanse the wound with Normal Saline prior to applying a clean dressing using gauze sponges, not tissues or cotton balls. Do not scrub or use excessive force. Pat dry using gauze sponges, not tissue or cotton balls. Topical: Gentamicin 1 x Per Day/30 Days Discharge Instructions: Apply as directed by provider. Primary Dressing: Silvercel Small 2x2 (in/in) (Generic) 1 x Per Day/30 Days Discharge  Instructions: Apply Silvercel Small 2x2 (in/in) as instructed Secondary Dressing: ABD Pad 5x9 (in/in) (Generic) 1 x Per Day/30 Days Discharge Instructions: Cover with ABD pad Secured With: under garment (Generic) 1 x Per Day/30 Days Discharge Instructions: secure in place with undergarment Electronic Signature(s) Signed: 03/08/2021 4:02:25 PM By: Rogers Blocker RN Signed: 03/08/2021 4:42:29 PM By: Lenda Kelp PA-C Entered By: Rogers Blocker on 03/08/2021 15:19:19 Breanna Bennett ([id number]) -------------------------------------------------------------------------------- Problem List Details Patient Name: Breanna Bennett Date of Service: 03/08/2021 2:45 PM Medical Record Number: [id number] Patient Account Number: [id number] Date of Birth/Sex: [birth year] (65 y.o. F) Treating RN: Primary Care Provider: Blane Ohara Other Clinician: Referring Provider: Blane Ohara Treating Provider/Extender: Rowan Blase in Treatment: 21 Active Problems ICD-10 Encounter Code Description Active Date MDM Diagnosis E11.622 Type 2 diabetes mellitus with other skin ulcer 10/10/2020 No Yes I87.323 Chronic venous hypertension (idiopathic) with inflammation of bilateral 10/10/2020 No Yes lower extremity L97.828 Non-pressure chronic ulcer of other part of left lower leg with other 10/10/2020 No Yes specified severity L97.818 Non-pressure chronic ulcer of other part of right lower leg with other 10/10/2020 No Yes specified severity L02.211 Cutaneous abscess of abdominal wall 12/11/2020 No Yes L98.492 Non-pressure chronic ulcer of skin of other sites with fat layer exposed 12/11/2020 No Yes S31.103D Unspecified open wound of abdominal wall, right lower quadrant without 01/02/2021 No Yes penetration into peritoneal cavity, subsequent encounter Inactive Problems ICD-10 Code Description Active Date Inactive Date I50.22 Chronic systolic (congestive) heart failure 10/10/2020 10/10/2020 Resolved  Problems Electronic Signature(s) Signed: 03/08/2021 2:55:07 PM By: Lenda Kelp PA-C Entered By: Lenda Kelp on 03/08/2021 14:55:06 Breanna Bennett ([id number]) -------------------------------------------------------------------------------- Progress Note Details Patient Name: Breanna Bennett Date of Service: 03/08/2021 2:45 PM Medical Record Number: [id number] Patient Account Number: [id number] Date of Birth/Sex: 07/15/1956 (65 y.o. F) Treating RN: Primary Care Provider: Blane Ohara Other Clinician: Referring Provider: Blane Ohara Treating Provider/Extender: Rowan Blase in Treatment: 21 Subjective Chief Complaint Information obtained from Patient 10/10/2020; patient is here with wounds on her bilateral lower legs History of Present Illness (HPI) ADMISSION 10/10/2020 This is a pleasant woman from [facility]. She is here accompanied by her daughter for review of wounds on her bilateral lower legs. Seems like these are chronic and have been present for the most part for about a year. She had compression stockings at one point but I am not sure that she is actually been wearing them. She has about 4 open areas on the left and more substantially for open areas on the right. She is a diabetic but is not on any current treatment. Her most other substantive problem is a severe cardiomyopathy with an ejection fraction less than 20%. Her daughter tells me that she runs a generally low blood pressure and she only takes 10 mg of Lasix with an adjustment if her blood pressure is over a  certain value giving it additional 10 mg. It does not seem like she has gained weight recently she says he is at 160 pounds and for her this is actually a reduction. They have been using Medihoney and Neosporin to this area. No recent compression wrap use. There are 4 open areas on the left and 4 open areas on the right. ABIs in our clinic were 1.35 on the right and 1.22 on the left 11/26/2020  on evaluation today patient appears to be doing poorly in regard to her lower extremities. With that being said the biggest issue currently seems to be really that she is having issues with extreme edema of the lower extremities and Dr. Leanord Hawking was concerned about the fact that to be honest the patient really needs to be seen by cardiology to see if there is anything they can do to help diurese her more as he was concerned about doing too strong of a compression. For that reason we have been doing Kerlix and Coban only for now patient has not seen her cardiologist we have not seen her since February 16 of this year. Unfortunately her wounds appear to be a bit worse than they were previous. 12/03/20 upon evaluation today patient appears to be doing well with regard to her wounds. She has been tolerating the dressing changes without complication. Fortunately there does not appear to be any signs of infection. She still has a tremendous amount of edema but this is being managed by her cardiologist in fact he did put her on some medications yesterday. This included increasing her furosemide to 20 mg 2 times a day. He is also lowering her blood pressure medication to compensate. Obviously this is a very fine balance with this patient. It was actually mentioned in the note that the patient may need to consider palliative care. Obviously were trying to avoid making anything worse while still healing her wounds. 12/11/2020 upon evaluation today patient appears to be doing well with regard to her wounds over the lower extremities. Unfortunately she has a new wound in the abdominal area as well underneath the periumbilical region which unfortunately she did not tell anybody about into I walked into the room. With that being said I think that we need to manage this as well today. She does have again some good granulation epithelization in regard to leg ulcers I feel like they are doing better. Although I am not  really sure exactly what she is doing with the dressing she seems to be putting a lot of Band-Aids on the area. That is not really the ideal thing. 5/11; patient admitted to the clinic in February. She did not really resurface again until early April and Hoyte stones been seeing her since then. She had wounds on her bilateral lower extremities probably chronic venous with lymphedema. She has been doing reasonably well. However 3 weeks ago she came in the clinic with abdominal wound located in the right lower quadrant.. A culture of them showed Serratia. She is seeing received a course of Omnicef that she finished. She comes in with 2 open wounds 1 in the right mid lower abdominal quadrant a large necrotic wound with some depth and a smaller draining area in the right lower quadrant distally. Both of these look somewhat purulent. She is not complaining of a lot of pain thinks the wounds have been there for about 2 months but really not a lot of specific history here. We have been using silver alginate on the  lower extremity wounds under compression and I believe silver alginate on the abdominal wounds 5/18; this is a patient I saw for the first time last week. She has wounds on her bilateral lower legs which look like chronic venous wounds with lymphedema. They have been doing reasonably well with silver collagen under the patient's own Farrow wraps. It came out today that she is really not allowing for family help with putting dressings on and as result she came in with very little today I talked to her about this In the last 2 weeks we have discovered that a large abdominal mass. She has a CT scan on the 25th which should tell us the nature of this although I am concerned about an underlying malignancy. She also has a necrotic wound malodorous. Culture I did last week showed Enterobacter cloacae I and Pseudomonas. Prescribing antibiotics for her complicated by the fact that she is on Coumadin with an  INR of 3 on 5/9 02/07/2021 upon evaluation today patient appears to be doing well with regard to her leg ulcers. This is great news. With that being said unfortunately the patient does still have an issue in her abdominal area. We do not know the results of any testing yet as she is not having her CT scan until later this afternoon. With that being said I do see evidence that she is to be honest still having a fairly significant area of induration in the abdominal area. The wound actually appears to be bigger and deeper here than when I saw it for certain and to be honest the draining sinus down below the still showing signs of draining quite frequently her daughter tells me. Subsequently the patient knows this is where she is hurting as well. From the standpoint of her legs again everything appears to be doing quite well. 02/21/2021 upon evaluation today patient appears to be doing well with regard to her wounds. In fact the legs as well as the abdominal area appear to be doing better. I did review the CT scan this shows that there is some thickening of the skin and some varicosities noted but no obvious mass, fluid collection, or abscess was noted. This is good news. Overall the wound also seems to be doing better. With that being said X1 to make sure that were not missing anything and probably can make referral to Dr. Arita Miss who is a Human resources officer to make sure he does not see anything different that needs to be intervened in regard to at this point. Breanna Bennett ([id number]) 03/08/2021 upon evaluation today patient appears to be doing well with regard to her wounds. In fact everything is looking a little better except for the right leg where it she has a tremendous amount of drainage noted. Fortunately there does not appear to be any signs of infection. She has been keeping the abdominal area clean and try to take care of this as best she can. Objective Constitutional Well-nourished and  well-hydrated in no acute distress. Vitals Time Taken: 2:46 PM, Height: 68 in, Weight: 160 lbs, BMI: 24.3, Temperature: 98.0 F, Pulse: 81 bpm, Respiratory Rate: 18 breaths/min, Blood Pressure: 110/78 mmHg. Respiratory normal breathing without difficulty. Psychiatric this patient is able to make decisions and demonstrates good insight into disease process. Alert and Oriented x 3. pleasant and cooperative. General Notes: Upon inspection patient's wound bed actually showed signs of good granulation epithelization at this point. There does not appear to be any evidence of infection which is  great news and overall I am extremely pleased with where things stand. Fortunately there is no evidence of worsening in general though she does have a tremendous amount of drainage from the right lateral lower extremity I can see the clear fluid just dripped out as I am sitting in the room. Nonetheless I think that this may be something she needs to follow-up with her heart failure clinic with regard to. Obviously she has some definite fluid retention issues here. Integumentary (Hair, Skin) Wound #3 status is Open. Original cause of wound was Gradually Appeared. The date acquired was: 08/26/2019. The wound has been in treatment 21 weeks. The wound is located on the Right,Distal Lower Leg. The wound measures 1.7cm length x 1.5cm width x 0.2cm depth; 2.003cm^2 area and 0.401cm^3 volume. There is Fat Layer (Subcutaneous Tissue) exposed. There is no tunneling or undermining noted. There is a large amount of serous drainage noted. There is medium (34-66%) red, pink granulation within the wound bed. There is a medium (34-66%) amount of necrotic tissue within the wound bed including Adherent Slough. Wound #4 status is Open. Original cause of wound was Gradually Appeared. The date acquired was: 08/26/2019. The wound has been in treatment 21 weeks. The wound is located on the Left Lower Leg. The wound measures 2.5cm length x  2.2cm width x 0.2cm depth; 4.32cm^2 area and 0.864cm^3 volume. There is Fat Layer (Subcutaneous Tissue) exposed. There is no tunneling or undermining noted. There is a medium amount of serous drainage noted. There is medium (34-66%) red, pink granulation within the wound bed. There is a medium (34-66%) amount of necrotic tissue within the wound bed including Adherent Slough. Wound #5 status is Open. Original cause of wound was Gradually Appeared. The date acquired was: 02/07/2020. The wound has been in treatment 21 weeks. The wound is located on the Right,Proximal Lower Leg. The wound measures 1.5cm length x 2cm width x 0.2cm depth; 2.356cm^2 area and 0.471cm^3 volume. There is Fat Layer (Subcutaneous Tissue) exposed. There is no tunneling or undermining noted. There is a large amount of serous drainage noted. The wound margin is flat and intact. There is medium (34-66%) pink, pale granulation within the wound bed. There is a medium (34-66%) amount of necrotic tissue within the wound bed including Adherent Slough. Wound #8 status is Open. Original cause of wound was Gradually Appeared. The date acquired was: 11/27/2020. The wound has been in treatment 12 weeks. The wound is located on the Medial Abdomen - Lower Quadrant. The wound measures 1cm length x 0.6cm width x 0.4cm depth; 0.471cm^2 area and 0.188cm^3 volume. There is Fat Layer (Subcutaneous Tissue) exposed. There is no undermining noted, however, there is tunneling at 10:00 with a maximum distance of 0.8cm. There is a large amount of serous drainage noted. There is medium (34-66%) red, pink granulation within the wound bed. There is a medium (34-66%) amount of necrotic tissue within the wound bed including Adherent Slough. 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 Bathing/  Shower/ Hygiene: May shower; gently cleanse wound with antibacterial soap, rinse and pat dry prior to dressing wounds Edema Control - Lymphedema / Segmental Compressive Device / Other: Elevate, Exercise Daily and Avoid Standing for Long Periods of Time. Elevate legs to the level of the heart and pump ankles as often as possible Elevate leg(s) parallel to the floor when sitting. WOUND #3: - Lower Leg Wound Laterality: Right, Distal Cleanser: Normal Saline 3 x Per Week/30 Days Discharge Instructions: Wash your hands with soap and water. Remove old dressing, discard into plastic bag and place into trash. Cleanse the wound with Normal Saline prior to applying a clean dressing using gauze sponges, not tissues or cotton balls. Do not scrub or use excessive force. Pat dry using gauze sponges, not tissue or cotton balls. Primary Dressing: Silvercel Small 2x2 (in/in) 3 x Per Week/30 Days Discharge Instructions: Apply Silvercel Small 2x2 (in/in) as instructed Secondary Dressing: ABD Pad 5x9 (in/in) (Generic) 3 x Per Week/30 Days Discharge Instructions: Cover with ABD pad Secondary Dressing: Kerlix 4.5 x 4.1 (in/yd) (Generic) 3 x Per Week/30 Days Discharge Instructions: Apply Kerlix 4.5 x 4.1 (in/yd) as instructed Secured With: 19M Medipore H Soft Cloth Surgical Tape, 2x2 (in/yd) (Generic) 3 x Per Week/30 Days Discharge Instructions: Secure kerlix Compression Wrap: Jobst Farrow Wrap 4000 3 x Per Week/30 Days Discharge Instructions: Secure kerlix  with compression wrap WOUND #4: - Lower Leg Wound Laterality: Left Cleanser: Normal Saline 3 x Per Week/30 Days Discharge Instructions: Wash your hands with soap and water. Remove old dressing, discard into plastic bag and place into trash. Cleanse the wound with Normal Saline prior to applying a clean dressing using gauze sponges, not tissues or cotton balls. Do not scrub or use excessive force. Pat dry using gauze sponges, not tissue or cotton balls. Primary Dressing: Silvercel Small 2x2 (in/in) 3 x Per Week/30 Days Discharge Instructions: Apply Silvercel Small 2x2 (in/in) as instructed Secondary Dressing: ABD Pad 5x9 (in/in) (Generic) 3 x Per Week/30 Days Discharge Instructions: Cover with ABD pad Secondary Dressing: Kerlix 4.5 x 4.1 (in/yd) (Generic) 3 x Per Week/30 Days Discharge Instructions: Apply Kerlix 4.5 x 4.1 (in/yd) as instructed Secured With: 19M Medipore H Soft Cloth Surgical Tape, 2x2 (in/yd) (Generic) 3 x Per Week/30 Days Discharge Instructions: Secure kerlix Compression Wrap: Jobst Farrow Wrap 4000 3 x Per Week/30 Days Discharge Instructions: Secure kerlix with compression wrap WOUND #5: - Lower Leg Wound Laterality: Right, Proximal Cleanser: Normal Saline 3 x Per Week/30 Days Discharge Instructions: Wash your hands with soap and water. Remove old dressing, discard into plastic bag and place into trash. Cleanse the wound with Normal Saline prior to applying a clean dressing using gauze sponges, not tissues or cotton balls. Do not scrub or use excessive force. Pat dry using gauze sponges, not tissue or cotton balls. Primary Dressing: Silvercel Small 2x2 (in/in) 3 x Per Week/30 Days Discharge Instructions: Apply Silvercel Small 2x2 (in/in) as instructed Secondary Dressing: ABD Pad 5x9 (in/in) (Generic) 3 x Per Week/30 Days Discharge Instructions: Cover with ABD pad Secondary Dressing: Kerlix 4.5 x 4.1 (in/yd) (Generic) 3 x Per Week/30 Days Discharge Instructions: Apply  Kerlix 4.5 x 4.1 (in/yd) as instructed Secured With: 19M Medipore H Soft Cloth Surgical Tape, 2x2 (in/yd) (Generic) 3 x Per Week/30 Days Discharge Instructions: Secure kerlix Compression Wrap: Jobst Farrow Wrap 4000 3 x Per Week/30 Days Discharge Instructions: Secure kerlix with compression wrap WOUND #8: - Abdomen - Lower Quadrant Wound Laterality: Medial Cleanser: Normal Saline 1 x  Per Day/30 Days Discharge Instructions: Wash your hands with soap and water. Remove old dressing, discard into plastic bag and place into trash. Cleanse the wound with Normal Saline prior to applying a clean dressing using gauze sponges, not tissues or cotton balls. Do not scrub or use excessive force. Pat dry using gauze sponges, not tissue or cotton balls. Topical: Gentamicin 1 x Per Day/30 Days Discharge Instructions: Apply as directed by provider. Primary Dressing: Silvercel Small 2x2 (in/in) (Generic) 1 x Per Day/30 Days Discharge Instructions: Apply Silvercel Small 2x2 (in/in) as instructed Secondary Dressing: ABD Pad 5x9 (in/in) (Generic) 1 x Per Day/30 Days Discharge Instructions: Cover with ABD pad Secured With: under garment (Generic) 1 x Per Day/30 Days Discharge Instructions: secure in place with undergarment 1. Would recommend currently that we going to continue with wound care measures as before and the patient is in agreement with the plan. This includes the use of the silver alginate to all wound locations. 2. I am also can recommend at this time that we have the patient continue with a ABD pad to cover over the silver alginate on her legs and then Breanna Bennett ([id number]) subsequently we will get a use Tubigrip before applying the Velcro compression wraps. Hopefully this will be better at controlling the edema. 3. I am also can recommend the patient should be elevating her legs as much as possible to try to keep edema under control I think this is a form of most importance. 4. She does have  an appointment with the plastic surgeon in September. That was the earliest they can get her in. We will see patient back for reevaluation in 1 week here in the clinic. If anything worsens or changes patient will contact our office for additional recommendations. Electronic Signature(s) Signed: 03/08/2021 3:25:07 PM By: Lenda Kelp PA-C Entered By: Lenda Kelp on 03/08/2021 15:25:06 Breanna Bennett ([id number]) -------------------------------------------------------------------------------- SuperBill Details Patient Name: Breanna Bennett Date of Service: 03/08/2021 Medical Record Number: [id number] Patient Account Number: [id number] Date of Birth/Sex: 04-Aug-1956 (65 y.o. F) Treating RN: Primary Care Provider: Blane Ohara Other Clinician: Referring Provider: Blane Ohara Treating Provider/Extender: Rowan Blase in Treatment: 21 Diagnosis Coding ICD-10 Codes Code Description E11.622 Type 2 diabetes mellitus with other skin ulcer I87.323 Chronic venous hypertension (idiopathic) with inflammation of bilateral lower extremity L97.828 Non-pressure chronic ulcer of other part of left lower leg with other specified severity L97.818 Non-pressure chronic ulcer of other part of right lower leg with other specified severity L02.211 Cutaneous abscess of abdominal wall L98.492 Non-pressure chronic ulcer of skin of other sites with fat layer exposed Unspecified open wound of abdominal wall, right lower quadrant without penetration into peritoneal cavity, subsequent S31.103D encounter Facility Procedures CPT4 Code: 95284132 Description: 99214 - WOUND CARE VISIT-LEV 4 EST PT Modifier: Quantity: 1 Physician Procedures CPT4 Code: 4401027 Description: 99214 - WC PHYS LEVEL 4 - EST PT Modifier: Quantity: 1 CPT4 Code: Description: ICD-10 Diagnosis Description E11.622 Type 2 diabetes mellitus with other skin ulcer I87.323 Chronic venous hypertension (idiopathic) with inflammation of  bilateral low L97.828 Non-pressure chronic ulcer of other part of left lower leg with  other speci L97.818 Non-pressure chronic ulcer of other part of right lower leg with other spec Modifier: er extremity fied severity ified severity Quantity: Electronic Signature(s) Signed: 03/08/2021 3:51:03 PM By: Rogers Blocker RN Signed: 03/08/2021 4:42:29 PM By: Lenda Kelp PA-C Previous Signature: 03/08/2021 3:26:41 PM Version By: Lenda Kelp PA-C Entered By: Rogers Blocker  on 03/08/2021 15:51:01

## 2021-03-08 NOTE — Progress Notes (Addendum)
Breanna Bennett, Breanna Bennett ([id number]) Visit Report for 03/08/2021 Arrival Information Details Patient Name: MYSTI, HALEY Date of Service: 03/08/2021 2:45 PM Medical Record Number: [id number] Patient Account Number: [id number] Date of Birth/Sex: 1955-09-17 (65 y.o. F) Treating RN: Rogers Blocker Primary Care Isela Stantz: Blane Ohara Other Clinician: Referring Samarion Ehle: Blane Ohara Treating Inez Rosato/Extender: Rowan Blase in Treatment: 21 Visit Information History Since Last Visit Pain Present Now: Yes Patient Arrived: Wheel Chair Arrival Time: 14:45 Accompanied By: daughter Transfer Assistance: Manual Patient Identification Verified: Yes Secondary Verification Process Completed: Yes Patient Requires Transmission-Based No Precautions: Patient Has Alerts: Yes Patient Alerts: Patient on Blood Thinner DIABETIC Electronic Signature(s) Signed: 03/08/2021 4:02:25 PM By: Rogers Blocker RN Entered By: Rogers Blocker on 03/08/2021 14:46:11 Breanna Bennett ([id number]) -------------------------------------------------------------------------------- Clinic Level of Care Assessment Details Patient Name: Breanna Bennett Date of Service: 03/08/2021 2:45 PM Medical Record Number: [id number] Patient Account Number: [id number] Date of Birth/Sex: 05/06/1956 (65 y.o. F) Treating RN: Rogers Blocker Primary Care Morrigan Wickens: Blane Ohara Other Clinician: Referring Shuaib Corsino: Blane Ohara Treating Lisbeth Puller/Extender: Rowan Blase in Treatment: 21 Clinic Level of Care Assessment Items TOOL 4 Quantity Score X - Use when only an EandM is performed on FOLLOW-UP visit 1 0 ASSESSMENTS - Nursing Assessment / Reassessment X - Reassessment of Co-morbidities (includes updates in patient status) 1 10 X- 1 5 Reassessment of Adherence to Treatment Plan ASSESSMENTS - Wound and Skin Assessment / Reassessment []  - Simple Wound Assessment / Reassessment - one wound 0 X- 4 5 Complex Wound Assessment /  Reassessment - multiple wounds []  - 0 Dermatologic / Skin Assessment (not related to wound area) ASSESSMENTS - Focused Assessment X - Circumferential Edema Measurements - multi extremities 1 5 []  - 0 Nutritional Assessment / Counseling / Intervention []  - 0 Lower Extremity Assessment (monofilament, tuning fork, pulses) []  - 0 Peripheral Arterial Disease Assessment (using hand held doppler) ASSESSMENTS - Ostomy and/or Continence Assessment and Care []  - Incontinence Assessment and Management 0 []  - 0 Ostomy Care Assessment and Management (repouching, etc.) PROCESS - Coordination of Care X - Simple Patient / Family Education for ongoing care 1 15 []  - 0 Complex (extensive) Patient / Family Education for ongoing care []  - 0 Staff obtains , Records, Test Results / Process Orders []  - 0 Staff telephones HHA, Nursing Homes / Clarify orders / etc []  - 0 Routine Transfer to another Facility (non-emergent condition) []  - 0 Routine Hospital Admission (non-emergent condition) []  - 0 New Admissions / / Ordering NPWT, Apligraf, etc. []  - 0 Emergency Hospital Admission (emergent condition) X- 1 10 Simple Discharge Coordination []  - 0 Complex (extensive) Discharge Coordination PROCESS - Special Needs []  - Pediatric / Minor Patient Management 0 []  - 0 Isolation Patient Management []  - 0 Hearing / Language / Visual special needs []  - 0 Assessment of Community assistance (transportation, D/C planning, etc.) []  - 0 Additional assistance / Altered mentation []  - 0 Support Surface(s) Assessment (bed, cushion, seat, etc.) INTERVENTIONS - Wound Cleansing / Measurement Debruhl, Breanna Bennett ( ) []  - 0 Simple Wound Cleansing - one wound X- 4 5 Complex Wound Cleansing - multiple wounds X- 1 5 Wound Imaging (photographs - any number of wounds) []  - 0 Wound Tracing (instead of photographs) []  - 0 Simple Wound Measurement - one wound X- 4 5 Complex  Wound Measurement - multiple wounds INTERVENTIONS - Wound Dressings []  - Small Wound Dressing one or multiple wounds 0 X- 1 15 Medium Wound Dressing one or  multiple wounds []  - 0 Large Wound Dressing one or multiple wounds []  - 0 Application of Medications - topical []  - 0 Application of Medications - injection INTERVENTIONS - Miscellaneous []  - External ear exam 0 []  - 0 Specimen Collection (cultures, biopsies, blood, body fluids, etc.) []  - 0 Specimen(s) / Culture(s) sent or taken to Lab for analysis []  - 0 Patient Transfer (multiple staff / / Similar devices) []  - 0 Simple Staple / Suture removal (25 or less) []  - 0 Complex Staple / Suture removal (26 or more) []  - 0 Hypo / Hyperglycemic Management (close monitor of Blood Glucose) []  - 0 Ankle / Brachial Index (ABI) - do not check if billed separately X- 1 5 Vital Signs Has the patient been seen at the hospital within the last three years: Yes Total Score: 130 Level Of Care: New/Established - Level 4 Electronic Signature(s) Signed: 03/08/2021 4:02:25 PM By: RN Entered By: on 03/08/2021 15:50:53 ( ) -------------------------------------------------------------------------------- Encounter Discharge Information Details Patient Name: Nurse, adult Date of Service: 03/08/2021 2:45 PM Medical Record Number: Patient Account Number: Date of Birth/Sex: 1955-11-01 (65 y.o. F) Treating RN: Rogers Blocker Primary Care Zeth Buday: Rogers Blocker Other Clinician: Referring Javohn Basey: 03/10/2021 Treating Milania Haubner/Extender: Breanna Bennett in Treatment: 21 Encounter Discharge Information Items Discharge Condition: Unstable Ambulatory Status: Wheelchair Discharge Destination: Home Transportation: Private Auto Accompanied By: daughter Schedule Follow-up Appointment: Yes Clinical Summary of Care: Electronic Signature(s) Signed:  03/08/2021 3:51:50 PM By: 03/10/2021 RN Entered By: [id number] on 03/08/2021 15:51:50 02/11/1956 (76) -------------------------------------------------------------------------------- Lower Extremity Assessment Details Patient Name: Rogers Blocker Date of Service: 03/08/2021 2:45 PM Medical Record Number: Blane Ohara Patient Account Number: Rowan Blase Date of Birth/Sex: [birth year] (65 y.o. F) Treating RN: Rogers Blocker Primary Care Juanjose Mojica: Rogers Blocker Other Clinician: Referring Donald Jacque: 03/10/2021 Treating Veretta Sabourin/Extender: Breanna Bennett Weeks in Treatment: 21 Edema Assessment Assessed: [Left: Yes] [Right: Yes] Edema: [Left: Yes] [Right: Yes] Calf Left: Right: Point of Measurement: 34 cm From Medial Instep 39.5 cm 39.5 cm Ankle Left: Right: Point of Measurement: 11 cm From Medial Instep 23 cm 22 cm Electronic Signature(s) Signed: 03/08/2021 4:02:25 PM By: Breanna Cruise RN Entered By: 03/10/2021 on 03/08/2021 15:04:18 [id number] (02/11/1956) -------------------------------------------------------------------------------- Multi Wound Chart Details Patient Name: 76 Date of Service: 03/08/2021 2:45 PM Medical Record Number: Blane Ohara Patient Account Number: Blane Ohara Date of Birth/Sex: 03/20/1956 (65 y.o. 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/Ulcer of the Lower Diabetic Wound/Ulcer of the  Lower Extremity Extremity Extremity Comorbid History: Congestive Heart Failure, Congestive Heart Failure, Congestive Heart Failure, Hypertension, Type II Diabetes Hypertension, Type II Diabetes Hypertension, Type II Diabetes Date Acquired: 08/26/2019 08/26/2019 02/07/2020 Weeks of Treatment: 21 21 21  Wound Status: Open Open Open Measurements L x W x D (cm) 1.7x1.5x0.2 2.5x2.2x0.2 1.5x2x0.2 Area (cm) : 2.003 4.32 2.356 Volume (cm) : 0.401 0.864 0.471 % Reduction in Area: 65.00% 42.70% 84.40% % Reduction in Volume: 82.50% 61.80% 84.40% Classification: Grade 2 Grade 2 Grade 2 Exudate Amount:  Large Medium Large Exudate Type: Serous Serous Serous Exudate Color: amber amber amber Wound Margin: N/A N/A Flat and Intact Granulation Amount: Medium (34-66%) Medium (34-66%) Medium (34-66%) Granulation Quality: Red, Pink Red, Pink Pink, Pale Necrotic Amount: Medium (34-66%) Medium (34-66%) Medium (34-66%) Exposed Structures: Fat Layer (Subcutaneous Tissue): Fat Layer (Subcutaneous Tissue): Fat Layer (Subcutaneous Tissue): Yes Yes Yes Fascia: No Fascia: No Fascia: No Tendon: No Tendon: No Tendon: No Muscle: No Muscle: No Muscle: No Joint: No Joint: No Joint: No Bone: No Bone: No Bone: No Epithelialization: Small (1-33%) Medium (34-66%) Small (1-33%) Wound Number: 8 N/A N/A Photos: N/A N/A CAMANI, TAUBMAN ([id number]) Wound Location: Medial Abdomen - Lower Quadrant N/A N/A Wounding Event: Gradually Appeared N/A N/A Primary Etiology: Trauma, Other N/A N/A Comorbid History: Congestive Heart Failure, N/A N/A Hypertension, Type II Diabetes Date Acquired: 11/27/2020 N/A N/A Weeks of Treatment: 12 N/A N/A Wound Status: Open N/A N/A Measurements L x W x D (cm) 1x0.6x0.4 N/A N/A Area (cm) : 0.471 N/A N/A Volume (cm) : 0.188 N/A N/A % Reduction in Area: 90.60% N/A N/A % Reduction in Volume: 93.80% N/A N/A Classification: Full Thickness Without Exposed N/A N/A Support  Structures Exudate Amount: Large N/A N/A Exudate Type: Serous N/A N/A Exudate Color: amber N/A N/A Wound Margin: N/A N/A N/A Granulation Amount: Medium (34-66%) N/A N/A Granulation Quality: Red, Pink N/A N/A Necrotic Amount: Medium (34-66%) N/A N/A Exposed Structures: Fat Layer (Subcutaneous Tissue): N/A N/A Yes Fascia: No Tendon: No Muscle: No Joint: No Bone: No Epithelialization: None N/A N/A Treatment Notes Electronic Signature(s) Signed: 03/08/2021 4:02:25 PM By: Rogers Blocker RN Entered By: Rogers Blocker on 03/08/2021 15:13:18 Breanna Bennett ([id number]) -------------------------------------------------------------------------------- Multi-Disciplinary Care Plan Details Patient Name: Breanna Bennett Date of Service: 03/08/2021 2:45 PM Medical Record Number: [id number] Patient Account Number: [id number] Date of Birth/Sex: Mar 25, 1956 (65 y.o. F) Treating RN: Rogers Blocker Primary Care Davidjames Blansett: Blane Ohara Other Clinician: Referring Ezmae Speers: Blane Ohara Treating Dayvion Sans/Extender: Rowan Blase in Treatment: 21 Active Inactive Electronic Signature(s) Signed: 04/23/2021 6:00:49 PM By: Elliot Gurney, BSN, RN, CWS, Kim RN, BSN Signed: 04/25/2021 4:49:12 PM By: Rogers Blocker RN Previous Signature: 03/08/2021 4:02:25 PM Version By: Rogers Blocker RN Entered By: Elliot Gurney BSN, RN, CWS, Kim on 04/23/2021 18:00:49 Breanna Bennett ([id number]) -------------------------------------------------------------------------------- Pain Assessment Details Patient Name: Breanna Bennett Date of Service: 03/08/2021 2:45 PM Medical Record Number: [id number] Patient Account Number: [id number] Date of Birth/Sex: February 08, 1956 (65 y.o. F) Treating RN: Rogers Blocker Primary Care Deronda Christian: Blane Ohara Other Clinician: Referring Tamrah Victorino: Blane Ohara Treating Kaelynne Christley/Extender: Rowan Blase in Treatment: 21 Active Problems Location of Pain Severity and Description of Pain Patient Has  Paino Yes Site Locations Pain Location: Pain in Ulcers Rate the pain. Current Pain Level: 6 Pain Management and Medication Current Pain Management: Electronic Signature(s) Signed: 03/08/2021 4:02:25 PM By: Rogers Blocker RN Entered By: Rogers Blocker on 03/08/2021 14:47:33 Breanna Bennett ([id number]) -------------------------------------------------------------------------------- Patient/Caregiver Education Details Patient Name: Breanna Bennett Date of Service: 03/08/2021 2:45 PM Medical Record Number: [id number] Patient Account Number: [id number] Date of Birth/Gender: 07-08-1956 (66 y.o. F) Treating RN: Rogers Blocker Primary Care Physician: Blane Ohara Other Clinician: Referring Physician: Blane Ohara Treating Physician/Extender: Rowan Blase in Treatment: 21 Education Assessment Education Provided To: Patient and Caregiver daughter Education Topics Provided Wound/Skin Impairment: Methods: Explain/Verbal Responses: State content correctly Electronic Signature(s) Signed: 03/08/2021 4:02:25 PM By: Rogers Blocker RN Entered By: Rogers Blocker on 03/08/2021 15:51:16 Kesselman, [name] ([id number]) --------------------------------------------------------------------------------  Wound Assessment Details Patient Name: Breanna Bennett, Breanna Bennett Date of Service: 03/08/2021 2:45 PM Medical Record Number: [id number] Patient Account Number: [id number] Date of Birth/Sex: 21-Sep-1955 (65 y.o. F) Treating RN: Rogers Blocker Primary Care Phares Zaccone: Blane Ohara Other Clinician: Referring Clorene Nerio: Blane Ohara Treating Zekiah Caruth/Extender: Rowan Blase in Treatment: 21 Wound Status Wound Number: 3 Primary Etiology: Diabetic Wound/Ulcer of the Lower Extremity Wound Location: Right, Distal Lower Leg Wound Status: Open Wounding Event: Gradually Appeared Comorbid Congestive Heart Failure, Hypertension, Type II History: Diabetes Date Acquired: 08/26/2019 Weeks Of Treatment:  21 Clustered Wound: No Photos Wound Measurements Length: (cm) 1.7 Width: (cm) 1.5 Depth: (cm) 0.2 Area: (cm) 2.003 Volume: (cm) 0.401 % Reduction in Area: 65% % Reduction in Volume: 82.5% Epithelialization: Small (1-33%) Tunneling: No Undermining: No Wound Description Classification: Grade 2 Exudate Amount: Large Exudate Type: Serous Exudate Color: amber Foul Odor After Cleansing: No Slough/Fibrino Yes Wound Bed Granulation Amount: Medium (34-66%) Exposed Structure Granulation Quality: Red, Pink Fascia Exposed: No Necrotic Amount: Medium (34-66%) Fat Layer (Subcutaneous Tissue) Exposed: Yes Necrotic Quality: Adherent Slough Tendon Exposed: No Muscle Exposed: No Joint Exposed: No Bone Exposed: No Electronic Signature(s) Signed: 03/08/2021 4:02:25 PM By: Rogers Blocker RN Entered By: Rogers Blocker on 03/08/2021 15:00:43 Breanna Bennett ([id number]) -------------------------------------------------------------------------------- Wound Assessment Details Patient Name: Breanna Bennett Date of Service: 03/08/2021 2:45 PM Medical Record Number: [id number] Patient Account Number: [id number] Date of Birth/Sex: [birth year] (65 y.o. F) Treating RN: Rogers Blocker Primary Care Jarmel Linhardt: Blane Ohara Other Clinician: Referring Ednah Hammock: Blane Ohara Treating Siniyah Evangelist/Extender: Rowan Blase in Treatment: 21 Wound Status Wound Number: 4 Primary Etiology: Diabetic Wound/Ulcer of the Lower Extremity Wound Location: Left Lower Leg Wound Status: Open Wounding Event: Gradually Appeared Comorbid Congestive Heart Failure, Hypertension, Type II History: Diabetes Date Acquired: 08/26/2019 Weeks Of Treatment: 21 Clustered Wound: No Photos Wound Measurements Length: (cm) 2.5 Width: (cm) 2.2 Depth: (cm) 0.2 Area: (cm) 4.32 Volume: (cm) 0.864 % Reduction in Area: 42.7% % Reduction in Volume: 61.8% Epithelialization: Medium (34-66%) Tunneling: No Undermining: No Wound  Description Classification: Grade 2 Exudate Amount: Medium Exudate Type: Serous Exudate Color: amber Foul Odor After Cleansing: No Slough/Fibrino Yes Wound Bed Granulation Amount: Medium (34-66%) Exposed Structure Granulation Quality: Red, Pink Fascia Exposed: No Necrotic Amount: Medium (34-66%) Fat Layer (Subcutaneous Tissue) Exposed: Yes Necrotic Quality: Adherent Slough Tendon Exposed: No Muscle Exposed: No Joint Exposed: No Bone Exposed: No Electronic Signature(s) Signed: 03/08/2021 4:02:25 PM By: Rogers Blocker RN Entered By: Rogers Blocker on 03/08/2021 15:01:14 Breanna Bennett ([id number]) -------------------------------------------------------------------------------- Wound Assessment Details Patient Name: Breanna Bennett Date of Service: 03/08/2021 2:45 PM Medical Record Number: [id number] Patient Account Number: [id number] Date of Birth/Sex: 06-Dec-1955 (64 y.o. F) Treating RN: Rogers Blocker Primary Care Vitoria Conyer: Blane Ohara Other Clinician: Referring Tinleigh Whitmire: Blane Ohara Treating Rameses Ou/Extender: Rowan Blase in Treatment: 21 Wound Status Wound Number: 5 Primary Etiology: Diabetic Wound/Ulcer of the Lower Extremity Wound Location: Right, Proximal Lower Leg Wound Status: Open Wounding Event: Gradually Appeared Comorbid Congestive Heart Failure, Hypertension, Type II History: Diabetes Date Acquired: 02/07/2020 Weeks Of Treatment: 21 Clustered Wound: No Photos Wound Measurements Length: (cm) 1.5 Width: (cm) 2 Depth: (cm) 0.2 Area: (cm) 2.356 Volume: (cm) 0.471 % Reduction in Area: 84.4% % Reduction in Volume: 84.4% Epithelialization: Small (1-33%) Tunneling: No Undermining: No Wound Description Classification: Grade 2 Wound Margin: Flat and Intact Exudate Amount: Large Exudate Type: Serous Exudate Color: amber Foul Odor After Cleansing: No Slough/Fibrino Yes Wound Bed Granulation Amount:  Medium (34-66%) Exposed  Structure Granulation Quality: Pink, Pale Fascia Exposed: No Necrotic Amount: Medium (34-66%) Fat Layer (Subcutaneous Tissue) Exposed: Yes Necrotic Quality: Adherent Slough Tendon Exposed: No Muscle Exposed: No Joint Exposed: No Bone Exposed: No Electronic Signature(s) Signed: 03/08/2021 4:02:25 PM By: Rogers BlockerSanchez , Kenia RN Entered By: Rogers BlockerSanchez , Kenia on 03/08/2021 15:01:54 Breanna Bennett, Breanna Bennett ([id number]) -------------------------------------------------------------------------------- Wound Assessment Details Patient Name: Breanna Bennett, Breanna Bennett Date of Service: 03/08/2021 2:45 PM Medical Record Number: [id number] Patient Account Number: [id number] Date of Birth/Sex: April 25, 1956 (65 y.o. F) Treating RN: Rogers BlockerSanchez, Kenia Primary Care Lorell Thibodaux: Blane Oharaox , Kirsten Other Clinician: Referring Rocky Gladden: Blane Oharaox , Kirsten Treating Kieana Livesay/Extender: Rowan BlaseStone, Hoyt Weeks in Treatment: 21 Wound Status Wound Number: 8 Primary Etiology: Trauma, Other Wound Location: Medial Abdomen - Lower Quadrant Wound Status: Open Wounding Event: Gradually Appeared Comorbid Congestive Heart Failure, Hypertension, Type II History: Diabetes Date Acquired: 11/27/2020 Weeks Of Treatment: 12 Clustered Wound: No Photos Wound Measurements Length: (cm) 1 Width: (cm) 0.6 Depth: (cm) 0.4 Area: (cm) 0.471 Volume: (cm) 0.188 % Reduction in Area: 90.6% % Reduction in Volume: 93.8% Epithelialization: None Tunneling: Yes Position (o'clock): 10 Maximum Distance: (cm) 0.8 Undermining: No Wound Description Classification: Full Thickness Without Exposed Support Structures Exudate Amount: Large Exudate Type: Serous Exudate Color: amber Foul Odor After Cleansing: No Slough/Fibrino Yes Wound Bed Granulation Amount: Medium (34-66%) Exposed Structure Granulation Quality: Red, Pink Fascia Exposed: No Necrotic Amount: Medium (34-66%) Fat Layer (Subcutaneous Tissue) Exposed: Yes Necrotic Quality: Adherent Slough Tendon Exposed:  No Muscle Exposed: No Joint Exposed: No Bone Exposed: No Electronic Signature(s) Signed: 03/08/2021 4:02:25 PM By: Rogers BlockerSanchez , Kenia RN Entered By: Rogers BlockerSanchez , Kenia on 03/08/2021 15:16:48 Breanna Bennett, Breanna Bennett ([id number]) -------------------------------------------------------------------------------- Vitals Details Patient Name: Breanna Bennett, Breanna Bennett Date of Service: 03/08/2021 2:45 PM Medical Record Number: [id number] Patient Account Number: [id number] Date of Birth/Sex: April 25, 1956 (65 y.o. F) Treating RN: Rogers BlockerSanchez, Kenia Primary Care Madelline Eshbach: Blane Oharaox , Kirsten Other Clinician: Referring Bronsen Serano: Blane Oharaox , Kirsten Treating Shivangi Lutz/Extender: Rowan BlaseStone, Hoyt Weeks in Treatment: 21 Vital Signs Time Taken: 14:46 Temperature (F): 98.0 Height (in): 68 Pulse (bpm): 81 Weight (lbs): 160 Respiratory Rate (breaths/min): 18 Body Mass Index (BMI): 24.3 Blood Pressure (mmHg): 110/78 Reference Range: 80 - 120 mg / dl Electronic Signature(s) Signed: 03/08/2021 4:02:25 PM By: Rogers BlockerSanchez , Kenia RN Entered By: Rogers BlockerSanchez , Kenia on 03/08/2021 14:47:26

## 2021-03-18 ENCOUNTER — Ambulatory Visit: Payer: 59 | Admitting: Physician Assistant

## 2021-03-21 ENCOUNTER — Telehealth: Payer: Self-pay | Admitting: Cardiology

## 2021-03-21 NOTE — Telephone Encounter (Signed)
Pt c/o swelling: STAT is pt has developed SOB within 24 hours  If swelling, where is the swelling located? Legs- they are leaking more  How much weight have you gained and in what time span?   Have you gained 3 pounds in a day or 5 pounds in a week?   Do you have a log of your daily weights (if so, list)?   Are you currently taking a fluid pill? yes  Are you currently SOB? no  Have you traveled recently? No- patient needs to be seen

## 2021-03-21 NOTE — Telephone Encounter (Signed)
Called pt but no answer or voicemail.

## 2021-03-22 NOTE — Telephone Encounter (Signed)
Tried calling patient. No answer and no voicemail set up for me to leave a message. 

## 2021-03-25 ENCOUNTER — Other Ambulatory Visit: Payer: Self-pay

## 2021-03-25 ENCOUNTER — Other Ambulatory Visit: Payer: Self-pay | Admitting: Family Medicine

## 2021-03-25 MED ORDER — MONTELUKAST SODIUM 10 MG PO TABS: 10.0000 mg | ORAL_TABLET | Freq: Every day | ORAL | 3 refills | Status: DC

## 2021-03-25 MED ORDER — ATORVASTATIN CALCIUM 10 MG PO TABS: 10.0000 mg | ORAL_TABLET | Freq: Every day | ORAL | 0 refills | Status: DC

## 2021-03-25 NOTE — Telephone Encounter (Signed)
Patsy Lager, Daughter of the patient called back to follow up on initial call from Thursday.  She reports the patient is still leaking fluid from her legs and needs to be seen by Cardiology before the Wound Center can see her

## 2021-03-25 NOTE — Telephone Encounter (Signed)
Pt also requesting singulair.   Lorita Officer, [place] 03/25/21 8:23 AM

## 2021-03-25 NOTE — Telephone Encounter (Signed)
Spoke to the patients daughter just now and she tells me that the patients legs have been leaking and she has been seeing the wound clinic in regards to this. She states that her legs are pouring out like water because her swelling is this bad. She has been taking furosemide 20 mg BID and spironolactone 12.5 mg daily. The wound clinic recommended that she give [place] a call for further recommendations.   Denies any SOB, pain, or any other symptoms at this time.   She states that her weight fluctuates but she states she has gained about 4-5 lbs in a two week period.

## 2021-03-25 NOTE — Telephone Encounter (Signed)
Please call Yolanda at [phone]

## 2021-03-26 ENCOUNTER — Other Ambulatory Visit: Payer: Self-pay

## 2021-03-26 ENCOUNTER — Telehealth: Payer: Self-pay | Admitting: Cardiology

## 2021-03-26 ENCOUNTER — Ambulatory Visit (INDEPENDENT_AMBULATORY_CARE_PROVIDER_SITE_OTHER): Payer: 59

## 2021-03-26 DIAGNOSIS — Z9229 Personal history of other drug therapy: Secondary | ICD-10-CM | POA: Diagnosis not present

## 2021-03-26 DIAGNOSIS — I639 Cerebral infarction, unspecified: Secondary | ICD-10-CM

## 2021-03-26 DIAGNOSIS — Z5181 Encounter for therapeutic drug level monitoring: Secondary | ICD-10-CM | POA: Diagnosis not present

## 2021-03-26 LAB — POCT INR: INR: 1.7 — AB (ref 2.0–3.0)

## 2021-03-26 NOTE — Telephone Encounter (Signed)
Discussed this with Dr. Tomie China directly as the patient was in the office for her coumadin check. He advises that if this continues she should be evaluated int he ED as this is something she would probably need IV fluids to fix.   The patient was advised of this while in the office and is agreeable to the plan of care.    Encouraged patient to call back with any questions or concerns.

## 2021-03-26 NOTE — Telephone Encounter (Signed)
Follow up:    Patient daughter calling to check the staus of the last message. Please advis.

## 2021-03-26 NOTE — Patient Instructions (Signed)
Take 2 tablets tonight only and then increase to 1 tablet daily except 0.5 tablet on Monday, Wednesday and Friday Be consistent with greens Recheck in 3 weeks

## 2021-03-28 ENCOUNTER — Ambulatory Visit: Payer: 59 | Admitting: Family Medicine

## 2021-04-01 ENCOUNTER — Other Ambulatory Visit: Payer: Self-pay

## 2021-04-02 ENCOUNTER — Ambulatory Visit: Payer: 59 | Admitting: Cardiology

## 2021-04-03 ENCOUNTER — Encounter: Payer: Self-pay | Admitting: Cardiology

## 2021-04-04 ENCOUNTER — Other Ambulatory Visit: Payer: Self-pay

## 2021-04-04 MED ORDER — DAPAGLIFLOZIN PROPANEDIOL 5 MG PO TABS: 5.0000 mg | ORAL_TABLET | Freq: Every day | ORAL | 0 refills | Status: DC

## 2021-04-05 ENCOUNTER — Encounter: Payer: Self-pay | Admitting: Cardiology

## 2021-04-05 ENCOUNTER — Ambulatory Visit (INDEPENDENT_AMBULATORY_CARE_PROVIDER_SITE_OTHER): Payer: Medicare Other | Admitting: Cardiology

## 2021-04-05 ENCOUNTER — Other Ambulatory Visit: Payer: Self-pay

## 2021-04-05 VITALS — BP 102/60 | HR 93 | Ht 68.0 in | Wt 160.0 lb

## 2021-04-05 DIAGNOSIS — I340 Nonrheumatic mitral (valve) insufficiency: Secondary | ICD-10-CM | POA: Diagnosis not present

## 2021-04-05 DIAGNOSIS — I519 Heart disease, unspecified: Secondary | ICD-10-CM

## 2021-04-05 DIAGNOSIS — I428 Other cardiomyopathies: Secondary | ICD-10-CM

## 2021-04-05 DIAGNOSIS — I447 Left bundle-branch block, unspecified: Secondary | ICD-10-CM

## 2021-04-05 NOTE — Progress Notes (Signed)
Cardiology Office Note:    Date:  04/05/2021   ID:  Breanna Bennett, DOB 11-13-1955, MRN [id number]  PCP:  Blane Ohara, MD  Cardiologist:  Gypsy Balsam, MD    Referring MD: Blane Ohara, MD   Chief Complaint  Patient presents with   Leaking fluids form both leg     R worst than L    History of Present Illness:    Breanna Bennett is a 65 y.o. female  female with past medical history significant for cardiomyopathy which is nonischemic ejection fraction 2025%, history of CVA, left bundle branch block, dyslipidemia, essential hypertension.  She refused any intervention, she refused any testing.  The most emerging issue right now is swelling of lower extremities that is getting worse as she got a lot of protein leak from her legs.  We did talk about options what to do with the situation.  I will ask her to have Chem-7 done if Chem-7 is okay will increase dose of diuretic now she takes 20 mg of furosemide twice daily.  Also advised him to increase protein intake.  I asked her also to limit her fluid intake.  Past Medical History:  Diagnosis Date   Acute CVA (cerebrovascular accident) (HCC) 05/30/2019   Acute respiratory failure (HCC)    Acute systolic CHF (congestive heart failure) (HCC) 01/12/2019   Cardiac cachexia 07/04/2020   Chronic systolic CHF (congestive heart failure) (HCC)    Class 2 severe obesity due to excess calories with serious comorbidity and body mass index (BMI) of 38.0 to 38.9 in adult (HCC) 10/19/2018   Cough due to bronchospasm 02/16/2019   Elevated hemoglobin (HCC)    Elevated uric acid in blood 06/06/2019   Essential hypertension 01/12/2019   Gastroesophageal reflux disease 10/19/2018   GERD (gastroesophageal reflux disease)    History of Coumadin therapy 06/16/2019   History of CVA (cerebrovascular accident) 05/30/2019   History of gout 12/06/2019   History of hemiplegia 05/30/2019   Hospital discharge follow-up 06/24/2019   Hyperlipidemia    Hypertension     Hypokalemia 12/06/2019   Impaired mobility and ADLs 05/30/2019   Late effect of cerebrovascular accident (CVA) 12/12/2019   LBBB (left bundle branch block)    Left ventricular ejection fraction less than 20% 05/30/2019   Middle cerebral artery embolism, 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   Mild renal insufficiency    Mitral regurgitation    NICM (nonischemic cardiomyopathy) (HCC)    Nonischemic cardiomyopathy (HCC) cardiac catheterization May 2020 showed normal coronaries, ejection fraction 30% 02/09/2019   Open wound 1 year ago   Bilateral legs   Perennial allergic rhinitis 02/16/2019   Peripheral edema 12/06/2019   Pre-diabetes    Stroke (cerebrum) (HCC) - R MCA infarct s/p mechanical thrombectomy 05/19/2019   Systolic CHF, acute (HCC) 02/09/2019   Tricuspid regurgitation    Type 2 diabetes mellitus with hyperglycemia, without long-term current use of insulin (HCC) 05/30/2019   Type 2 diabetes mellitus, uncontrolled (HCC) 05/27/2019    Past Surgical History:  Procedure Laterality Date   IR ANGIO VERTEBRAL SEL SUBCLAVIAN INNOMINATE UNI R MOD SED  05/19/2019   IR CT HEAD LTD  05/19/2019   IR PERCUTANEOUS ART THROMBECTOMY/INFUSION INTRACRANIAL INC DIAG ANGIO  05/19/2019   RADIOLOGY WITH ANESTHESIA N/A 05/19/2019   Procedure: IR WITH ANESTHESIA;  Surgeon: Julieanne Cotton, MD;  Location: [facility] OR;  Service: Radiology;  Laterality: N/A;   RIGHT/LEFT HEART CATH AND CORONARY ANGIOGRAPHY N/A 01/13/2019   Procedure: RIGHT/LEFT HEART CATH AND CORONARY ANGIOGRAPHY;  Surgeon:  Runell Gess, MD;  Location: [facility] INVASIVE CV [facility];  Service: Cardiovascular;  Laterality: N/A;   TUBAL LIGATION      Current Medications: Current Meds  Medication Sig   allopurinol (ZYLOPRIM) 100 MG tablet Take 100 mg by mouth 2 (two) times daily.   atorvastatin (LIPITOR) 10 MG tablet Take 1 tablet (10 mg total) by mouth daily.   carvedilol (COREG) 3.125 MG tablet TAKE 1 TABLET(3.125 MG) BY MOUTH TWICE DAILY WITH A MEAL (Patient taking differently:  Take 3.125 mg by mouth 2 (two) times daily with a meal.)   dapagliflozin propanediol (FARXIGA) 5 MG TABS tablet Take 1 tablet (5 mg total) by mouth daily before breakfast.   famotidine (PEPCID) 40 MG tablet Take 1 tablet (40 mg total) by mouth at bedtime.   fluticasone (FLONASE) 50 MCG/ACT nasal spray Place 2 sprays into both nostrils daily as needed for allergies or rhinitis.   furosemide (LASIX) 20 MG tablet TAKE 1 TABLET(20 MG) BY MOUTH TWICE DAILY (Patient taking differently: Take 20 mg by mouth 2 (two) times daily.)   gentamicin ointment (GARAMYCIN) 0.1 % Apply 1 application topically as directed. To the affected areas(legs) Open wounds   levothyroxine (SYNTHROID) 50 MCG tablet Take 1 tablet (50 mcg total) by mouth daily before breakfast.   loratadine (CLARITIN) 10 MG tablet Take 1 tablet (10 mg total) by mouth daily as needed for allergies or rhinitis.   montelukast (SINGULAIR) 10 MG tablet Take 1 tablet (10 mg total) by mouth at bedtime.   potassium chloride (KLOR-CON) 10 MEQ tablet TAKE 1 TABLET(10 MEQ) BY MOUTH DAILY (Patient taking differently: Take 10 mEq by mouth daily.)   PROAIR HFA 108 (90 Base) MCG/ACT inhaler Inhale 2 puffs into the lungs every 4 (four) hours as needed for shortness of breath or wheezing.   spironolactone (ALDACTONE) 25 MG tablet Take 0.5 tablets (12.5 mg total) by mouth once for 1 dose.   Vitamin D, Ergocalciferol, (DRISDOL) 1.25 MG (50000 UT) CAPS capsule Take 50,000 Units by mouth every Monday.    warfarin (COUMADIN) 2.5 MG tablet TAKE 1 TABLET BY MOUTH DAILY EXCEPT 1/2 TABLET ON MONDAY, WEDNESDAY, FRIDAY OR AS DIRECTED (Patient taking differently: Take 2.5 mg by mouth as directed. TAKE 1 TABLET BY MOUTH DAILY EXCEPT 1/2 TABLET ON MONDAY, WEDNESDAY, FRIDAY OR AS DIRECTED)     Allergies:   Lisinopril and Losartan   Social History   Socioeconomic History   Marital status: Unknown    Spouse name: Not on file   Number of children: Not on file   Years of  education: Not on file   Highest education level: Not on file  Occupational History   Not on file  Tobacco Use   Smoking status: Never   Smokeless tobacco: Never  Vaping Use   Vaping Use: Never used  Substance and Sexual Activity   Alcohol use: Never   Drug use: Never   Sexual activity: Not on file  Other Topics Concern   Not on file  Social History Narrative   Not on file   Social Determinants of Health   Financial Resource Strain: Not on file  Food Insecurity: Not on file  Transportation Needs: Not on file  Physical Activity: Not on file  Stress: Not on file  Social Connections: Not on file     Family History: The patient's family history includes CAD in her mother; Cancer in her brother; Colon cancer in her father; Heart failure in her mother; Hypertension in her mother.  ROS:   Please see the history of present illness.    All 14 point review of systems negative except as described per history of present illness  EKGs/Labs/Other Studies Reviewed:      Recent Labs: 07/04/2020: NT-Pro BNP 7,165 08/08/2020: BNP 2,850.2; TSH 2.960 12/13/2020: ALT 3; BUN 14; Hemoglobin 13.3; Platelets 336; Potassium 4.7; Sodium 137 02/07/2021: Creatinine, Ser 0.60  Recent Lipid Panel    Component Value Date/Time   CHOL 76 (L) 12/13/2020 1129   TRIG 57 12/13/2020 1129   HDL 18 (L) 12/13/2020 1129   CHOLHDL 4.2 12/13/2020 1129   CHOLHDL 6.4 05/20/2019 0441   VLDL 18 05/20/2019 0441   LDLCALC 44 12/13/2020 1129    Physical Exam:    VS:  BP 102/60 (BP Location: Right Arm, Patient Position: Sitting)   Pulse 93   Ht 5\' 8"  (1.727 m)   Wt 160 lb (72.6 kg)   SpO2 97%   BMI 24.33 kg/m     Wt Readings from Last 3 Encounters:  04/05/21 160 lb (72.6 kg)  02/04/21 188 lb (85.3 kg)  12/31/20 160 lb (72.6 kg)     GEN:  Well nourished, well developed in no acute distress HEENT: Normal NECK: No JVD; No carotid bruits LYMPHATICS: No lymphadenopathy CARDIAC: RRR, systolic murmur  grade 1/6 best at the base, no rubs, no gallops RESPIRATORY:  Clear to auscultation without rales, wheezing or rhonchi  ABDOMEN: Soft, non-tender, non-distended MUSCULOSKELETAL:  No edema; No deformity  SKIN: Warm and dry LOWER EXTREMITIES: no swelling NEUROLOGIC:  Alert and oriented x 3 PSYCHIATRIC:  Normal affect   ASSESSMENT:    1. Nonischemic cardiomyopathy [facility]) cardiac catheterization May 2020 showed normal coronaries, ejection fraction 30%   2. Nonrheumatic mitral valve regurgitation   3. LBBB (left bundle branch block)   4. Cardiac cachexia    PLAN:    In order of problems listed above:  Nonischemic cardiomyopathy which is severe the biggest limitation I have is her blood pressure being low.  Now plan is to increase diuresis to improve swelling of lower extremities.  She refused any work-up for her cardiomyopathy including cardiac catheterization, ICD placement Nonrheumatic mitral valve regurgitation which is related to severe cardiomyopathy with cardiomegaly.  Plan as described above Cardiac cachexia noted.  In my opinion she can benefit from a nutritionist.  I advised her to be more careful with fluid intake and I encouraged her to eat more protein.   Medication Adjustments/Labs and Tests Ordered: Current medicines are reviewed at length with the patient today.  Concerns regarding medicines are outlined above.  No orders of the defined types were placed in this encounter.  Medication changes: No orders of the defined types were placed in this encounter.   Signed, June 2020, MD, [facility] 04/05/2021 4:08 PM    [facility]

## 2021-04-05 NOTE — Patient Instructions (Signed)
Medication Instructions:  Your physician recommends that you continue on your current medications as directed. Please refer to the Current Medication list given to you today.  *If you need a refill on your cardiac medications before your next appointment, please call your pharmacy*   Lab Work: Your physician recommends that you return for lab work in:  TODAY: BMET If you have labs (blood work) drawn today and your tests are completely normal, you will receive your results only by: MyChart Message (if you have MyChart) OR A paper copy in the mail If you have any lab test that is abnormal or we need to change your treatment, we will call you to review the results.   Testing/Procedures: None   Follow-Up: At [facility] you and your health needs are our priority.  As part of our continuing mission to provide you with exceptional heart care, we have created designated Provider Care Teams.  These Care Teams include your primary Cardiologist (physician) and Advanced Practice Providers (APPs -  Physician Assistants and Nurse Practitioners) who all work together to provide you with the care you need, when you need it.  We recommend signing up for the patient portal called "MyChart".  Sign up information is provided on this After Visit Summary.  MyChart is used to connect with patients for Virtual Visits (Telemedicine).  Patients are able to view lab/test results, encounter notes, upcoming appointments, etc.  Non-urgent messages can be sent to your provider as well.   To learn more about what you can do with MyChart, go to [email or web address].    Your next appointment:   4 month(s)  The format for your next appointment:   In Person  Provider:   Gypsy Balsam, MD   Other Instructions

## 2021-04-06 LAB — BASIC METABOLIC PANEL
BUN/Creatinine Ratio: 17 (ref 12–28)
BUN: 16 mg/dL (ref 8–27)
CO2: 23 mmol/L (ref 20–29)
Calcium: 8.9 mg/dL (ref 8.7–10.3)
Chloride: 102 mmol/L (ref 96–106)
Creatinine, Ser: 0.92 mg/dL (ref 0.57–1.00)
Glucose: 122 mg/dL — ABNORMAL HIGH (ref 65–99)
Potassium: 3.4 mmol/L — ABNORMAL LOW (ref 3.5–5.2)
Sodium: 139 mmol/L (ref 134–144)
eGFR: 69 mL/min/{1.73_m2} (ref >59)

## 2021-04-10 ENCOUNTER — Ambulatory Visit (INDEPENDENT_AMBULATORY_CARE_PROVIDER_SITE_OTHER): Payer: Medicare Other | Admitting: Family Medicine

## 2021-04-10 ENCOUNTER — Encounter: Payer: Self-pay | Admitting: Family Medicine

## 2021-04-10 ENCOUNTER — Other Ambulatory Visit: Payer: Self-pay

## 2021-04-10 VITALS — BP 102/68 | HR 71 | Temp 96.8°F | Ht 68.0 in | Wt 162.9 lb

## 2021-04-10 DIAGNOSIS — E1121 Type 2 diabetes mellitus with diabetic nephropathy: Secondary | ICD-10-CM

## 2021-04-10 DIAGNOSIS — I5022 Chronic systolic (congestive) heart failure: Secondary | ICD-10-CM | POA: Diagnosis not present

## 2021-04-10 DIAGNOSIS — I428 Other cardiomyopathies: Secondary | ICD-10-CM

## 2021-04-10 DIAGNOSIS — E782 Mixed hyperlipidemia: Secondary | ICD-10-CM

## 2021-04-10 DIAGNOSIS — D6869 Other thrombophilia: Secondary | ICD-10-CM

## 2021-04-10 DIAGNOSIS — E039 Hypothyroidism, unspecified: Secondary | ICD-10-CM

## 2021-04-10 MED ORDER — BLOOD GLUCOSE MONITOR KIT: PACK | 0 refills | Status: DC

## 2021-04-10 NOTE — Patient Instructions (Signed)
Heart-Healthy Eating Plan Heart-healthy meal planning includes: Eating less unhealthy fats. Eating more healthy fats. Making other changes in your diet. Talk with your doctor or a diet specialist (dietitian) to create an eating plan that is right for you. What are tips for following this plan? Cooking Avoid frying your food. Try to bake, boil, grill, or broil it instead. You can also reduce fat by: Removing the skin from poultry. Removing all visible fats from meats. Steaming vegetables in water or broth. Meal planning  At meals, divide your plate into four equal parts: Fill one-half of your plate with vegetables and green salads. Fill one-fourth of your plate with whole grains. Fill one-fourth of your plate with lean protein foods. Eat 4-5 servings of vegetables per day. A serving of vegetables is: 1 cup of raw or cooked vegetables. 2 cups of raw leafy greens. Eat 4-5 servings of fruit per day. A serving of fruit is: 1 medium whole fruit.  cup of dried fruit.  cup of fresh, frozen, or canned fruit.  cup of 100% fruit juice. Eat more foods that have soluble fiber. These are apples, broccoli, carrots, beans, peas, and barley. Try to get 20-30 g of fiber per day. Eat 4-5 servings of nuts, legumes, and seeds per week: 1 serving of dried beans or legumes equals  cup after being cooked. 1 serving of nuts is  cup. 1 serving of seeds equals 1 tablespoon.  General information Eat more home-cooked food. Eat less restaurant, buffet, and fast food. Limit or avoid alcohol. Limit foods that are high in starch and sugar. Avoid fried foods. Lose weight if you are overweight. Keep track of how much salt (sodium) you eat. This is important if you have high blood pressure. Ask your doctor to tell you more about this. Try to add vegetarian meals each week. Fats Choose healthy fats. These include olive oil and canola oil, flaxseeds, walnuts, almonds, and seeds. Eat more omega-3 fats. These  include salmon, mackerel, sardines, tuna, flaxseed oil, and ground flaxseeds. Try to eat fish at least 2 times each week. Check food labels. Avoid foods with trans fats or high amounts of saturated fat. Limit saturated fats. These are often found in animal products, such as meats, butter, and cream. These are also found in plant foods, such as palm oil, palm kernel oil, and coconut oil. Avoid foods with partially hydrogenated oils in them. These have trans fats. Examples are stick margarine, some tub margarines, cookies, crackers, and other baked goods. What foods can I eat? Fruits All fresh, canned (in natural juice), or frozen fruits. Vegetables Fresh or frozen vegetables (raw, steamed, roasted, or grilled). Green salads. Grains Most grains. Choose whole wheat and whole grains most of the time. Rice andpasta, including brown rice and pastas made with whole wheat. Meats and other proteins Lean, well-trimmed beef, veal, pork, and lamb. Chicken and [place] without skin. All fish and shellfish. Wild duck, rabbit, pheasant, and venison. Egg whites or low-cholesterol egg substitutes. Dried beans, peas, lentils, and tofu. Seedsand most nuts. Dairy Low-fat or nonfat cheeses, including ricotta and mozzarella. Skim or 1% milk that is liquid, powdered, or evaporated. Buttermilk that is made with low-fatmilk. Nonfat or low-fat yogurt. Fats and oils Non-hydrogenated (trans-free) margarines. Vegetable oils, including soybean, sesame, sunflower, olive, peanut, safflower, corn, canola, and cottonseed. Salad dressings or mayonnaisemade with a vegetable oil. Beverages Mineral water. Coffee and tea. Diet carbonated beverages. Sweets and desserts Sherbet, gelatin, and fruit ice. Small amounts of dark chocolate. Limit  all sweets and desserts. Seasonings and condiments All seasonings and condiments. The items listed above may not be a complete list of foods and drinks you can eat. Contact a dietitian for more  options. What foods should I avoid? Fruits Canned fruit in heavy syrup. Fruit in cream or butter sauce. Fried fruit. Limitcoconut. Vegetables Vegetables cooked in cheese, cream, or butter sauce. Fried vegetables. Grains Breads that are made with saturated or trans fats, oils, or whole milk. Croissants. Sweet rolls. Donuts. High-fat crackers,such as cheese crackers. Meats and other proteins Fatty meats, such as hot dogs, ribs, sausage, bacon, rib-eye roast or steak. High-fat deli meats, such as salami and bologna. Caviar. Domestic duck andgoose. Organ meats, such as liver. Dairy Cream, sour cream, cream cheese, and creamed cottage cheese. Whole-milk cheeses. Whole or 2% milk that is liquid, evaporated, or condensed. Whole buttermilk. Cream sauce or high-fat cheese sauce. Yogurt that is made fromwhole milk. Fats and oils Meat fat, or shortening. Cocoa butter, hydrogenated oils, palm oil, coconut oil, palm kernel oil. Solid fats and shortenings, including bacon fat, salt pork, lard, and butter. Nondairy cream substitutes. Salad dressings with cheeseor sour cream. Beverages Regular sodas and juice drinks with added sugar. Sweets and desserts Frosting. Pudding. Cookies. Cakes. Pies. Milk chocolate or white chocolate.Buttered syrups. Full-fat ice cream or ice cream drinks. The items listed above may not be a complete list of foods and drinks to avoid. Contact a dietitian for more information. Summary Heart-healthy meal planning includes eating less unhealthy fats, eating more healthy fats, and making other changes in your diet. Eat a balanced diet. This includes fruits and vegetables, low-fat or nonfat dairy, lean protein, nuts and legumes, whole grains, and heart-healthy oils and fats. This information is not intended to replace advice given to you by your health care provider. Make sure you discuss any questions you have with your healthcare provider. Document Revised: 10/15/2017 Document  Reviewed: 09/18/2017 Elsevier Patient Education  2022 [employer].

## 2021-04-10 NOTE — Progress Notes (Signed)
Subjective:  Patient ID: Breanna Bennett, female    DOB: 05-23-1956  Age: 65 y.o. MRN: [id number]  Chief Complaint  Patient presents with   Hyperlipidemia   Diabetes:  Complications: with hyperglycemia, CHF, nephropathy Glucose checking: 1-2 x daily Glucose logs:100-220. Hypoglycemia: no Most recent A1C:6.9 Current medications: farxiga Last Eye Exam: overdue Foot checks: daily. Lifestyle changes: Eat healthy. Exercise.   Hyperlipidemia: Current medications: lipitor 10 mg once daily.  Lifestyle changes: healthy diet.   Hypertension: Complications: Current medications: Lifestyle changes:  Hyperlipidemia Pertinent negatives include no myalgias or shortness of breath.    Current Outpatient Medications on File Prior to Visit  Medication Sig Dispense Refill   Accu-Chek Softclix Lancets lancets      dapagliflozin propanediol (FARXIGA) 5 MG TABS tablet Take 1 tablet (5 mg total) by mouth daily before breakfast. 90 tablet 0   famotidine (PEPCID) 40 MG tablet Take 1 tablet (40 mg total) by mouth at bedtime. 90 tablet 3   fluticasone (FLONASE) 50 MCG/ACT nasal spray Place 2 sprays into both nostrils daily as needed for allergies or rhinitis. 18.2 mL 3   furosemide (LASIX) 20 MG tablet TAKE 1 TABLET(20 MG) BY MOUTH TWICE DAILY (Patient taking differently: Take 20 mg by mouth 2 (two) times daily.) 180 tablet 1   gentamicin ointment (GARAMYCIN) 0.1 % Apply 1 application topically as directed. To the affected areas(legs) Open wounds     levothyroxine (SYNTHROID) 50 MCG tablet Take 1 tablet (50 mcg total) by mouth daily before breakfast. 90 tablet 1   loratadine (CLARITIN) 10 MG tablet Take 1 tablet (10 mg total) by mouth daily as needed for allergies or rhinitis. 90 tablet 0   potassium chloride (KLOR-CON) 10 MEQ tablet TAKE 1 TABLET(10 MEQ) BY MOUTH DAILY (Patient taking differently: Take 10 mEq by mouth daily.) 90 tablet 1   PROAIR HFA 108 (90 Base) MCG/ACT inhaler Inhale 2 puffs into  the lungs every 4 (four) hours as needed for shortness of breath or wheezing.     Vitamin D, Ergocalciferol, (DRISDOL) 1.25 MG (50000 UT) CAPS capsule Take 50,000 Units by mouth every Monday.      warfarin (COUMADIN) 2.5 MG tablet TAKE 1 TABLET BY MOUTH DAILY EXCEPT 1/2 TABLET ON MONDAY, WEDNESDAY, FRIDAY OR AS DIRECTED (Patient taking differently: Take 2.5 mg by mouth as directed. TAKE 1 TABLET BY MOUTH DAILY EXCEPT 1/2 TABLET ON MONDAY, WEDNESDAY, FRIDAY OR AS DIRECTED) 90 tablet 0   spironolactone (ALDACTONE) 25 MG tablet Take 0.5 tablets (12.5 mg total) by mouth once for 1 dose. 30 tablet 3   No current facility-administered medications on file prior to visit.   Past Medical History:  Diagnosis Date   Acute CVA (cerebrovascular accident) ([place]) 05/30/2019   Acute respiratory failure (HCC)    Acute systolic CHF (congestive heart failure) ([place]) 01/12/2019   Cardiac cachexia 96/29/5284   Chronic systolic CHF (congestive heart failure) (HCC)    Class 2 severe obesity due to excess calories with serious comorbidity and body mass index (BMI) of 38.0 to 38.9 in adult ([place]) 10/19/2018   Cough due to bronchospasm 02/16/2019   Elevated hemoglobin (HCC)    Elevated uric acid in blood 06/06/2019   Essential hypertension 01/12/2019   Gastroesophageal reflux disease 10/19/2018   GERD (gastroesophageal reflux disease)    History of Coumadin therapy 06/16/2019   History of CVA (cerebrovascular accident) 05/30/2019   History of gout 12/06/2019   History of hemiplegia 05/30/2019   Hospital discharge follow-up 06/24/2019  Hyperlipidemia    Hypertension    Hypokalemia 12/06/2019   Impaired mobility and ADLs 05/30/2019   Late effect of cerebrovascular accident (CVA) 12/12/2019   LBBB (left bundle branch block)    Left ventricular ejection fraction less than 20% 05/30/2019   Middle cerebral artery embolism, 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   Mild renal insufficiency    Mitral regurgitation    NICM (nonischemic cardiomyopathy)  ([place])    Nonischemic cardiomyopathy (HCC) cardiac catheterization May 2020 showed normal coronaries, ejection fraction 30% 02/09/2019   Open wound 1 year ago   Bilateral legs   Perennial allergic rhinitis 02/16/2019   Peripheral edema 12/06/2019   Pre-diabetes    Stroke (cerebrum) (HCC) - R MCA infarct s/p mechanical thrombectomy 9/37/9024   Systolic CHF, acute ([place]) 02/09/2019   Tricuspid regurgitation    Type 2 diabetes mellitus with hyperglycemia, without long-term current use of insulin ([place]) 05/30/2019   Type 2 diabetes mellitus, uncontrolled ([place]) 05/27/2019   Past Surgical History:  Procedure Laterality Date   IR ANGIO VERTEBRAL SEL SUBCLAVIAN INNOMINATE UNI R MOD SED  05/19/2019   IR CT HEAD LTD  05/19/2019   IR PERCUTANEOUS ART THROMBECTOMY/INFUSION INTRACRANIAL INC DIAG ANGIO  05/19/2019   RADIOLOGY WITH ANESTHESIA N/A 05/19/2019   Procedure: IR WITH ANESTHESIA;  Surgeon: Luanne Bras, MD;  Location: [facility];  Service: Radiology;  Laterality: N/A;   RIGHT/LEFT HEART CATH AND CORONARY ANGIOGRAPHY N/A 01/13/2019   Procedure: RIGHT/LEFT HEART CATH AND CORONARY ANGIOGRAPHY;  Surgeon: Lorretta Harp, MD;  Location: [facility];  Service: Cardiovascular;  Laterality: N/A;   TUBAL LIGATION      Family History  Problem Relation Age of Onset   CAD Mother    Heart failure Mother    Hypertension Mother    Colon cancer Father    Cancer Brother    Social History   Socioeconomic History   Marital status: Unknown    Spouse name: Not on file   Number of children: Not on file   Years of education: Not on file   Highest education level: Not on file  Occupational History   Not on file  Tobacco Use   Smoking status: Never   Smokeless tobacco: Never  Vaping Use   Vaping Use: Never used  Substance and Sexual Activity   Alcohol use: Never   Drug use: Never   Sexual activity: Not on file  Other Topics Concern   Not on file  Social History Narrative   Not on file   Social  Determinants of Health   Financial Resource Strain: Not on file  Food Insecurity: Not on file  Transportation Needs: Not on file  Physical Activity: Not on file  Stress: Not on file  Social Connections: Not on file    Review of Systems  Constitutional:  Negative for appetite change, fatigue and fever.  HENT:  Negative for congestion, ear pain, sinus pressure and sore throat.   Eyes:  Negative for pain.  Respiratory:  Negative for cough, chest tightness, shortness of breath and wheezing.   Cardiovascular:  Negative for palpitations.  Gastrointestinal:  Negative for abdominal pain, constipation, diarrhea, nausea and vomiting.  Genitourinary:  Negative for dysuria and hematuria.  Musculoskeletal:  Negative for arthralgias, back pain, joint swelling and myalgias.  Skin:  Negative for rash.  Neurological:  Negative for dizziness, weakness and headaches.  Psychiatric/Behavioral:  Negative for dysphoric mood. The patient is not nervous/anxious.     Objective:  BP 102/68 (BP Location: Left  Arm, Patient Position: Sitting)   Pulse 71   Temp (!) 96.8 F (36 C) (Temporal)   Ht '5\' 8"'  (1.727 m)   Wt 162 lb 14.4 oz (73.9 kg)   SpO2 94%   BMI 24.77 kg/m   BP/Weight 04/10/2021 04/05/2021 0/88/1103  Systolic BP 159 458 90  Diastolic BP 68 60 60  Wt. (Lbs) 162.9 160 188  BMI 24.77 24.33 28.59    Physical Exam Vitals reviewed.  Constitutional:      Appearance: Normal appearance.  HENT:     Right Ear: Tympanic membrane, ear canal and external ear normal.     Left Ear: Tympanic membrane, ear canal and external ear normal.     Nose: Nose normal.     Mouth/Throat:     Mouth: Mucous membranes are moist.  Cardiovascular:     Rate and Rhythm: Normal rate and regular rhythm.     Pulses: Normal pulses.     Heart sounds: Normal heart sounds.  Pulmonary:     Effort: Pulmonary effort is normal.     Breath sounds: Normal breath sounds.  Abdominal:     Palpations: Abdomen is soft.   Musculoskeletal:        General: Normal range of motion.     Cervical back: Normal range of motion.  Skin:    General: Skin is warm and dry.  Neurological:     Mental Status: She is alert and oriented to person, place, and time.  Psychiatric:        Mood and Affect: Mood normal.        Behavior: Behavior normal.        Thought Content: Thought content normal.        Judgment: Judgment normal.    Diabetic Foot Exam - Simple   No data filed      Lab Results  Component Value Date   WBC 4.4 04/10/2021   HGB 13.5 04/10/2021   HCT 41.1 04/10/2021   PLT 264 04/10/2021   GLUCOSE 91 04/10/2021   CHOL 76 (L) 04/10/2021   TRIG 45 04/10/2021   HDL 22 (L) 04/10/2021   LDLCALC 42 04/10/2021   ALT 5 04/10/2021   AST 13 04/10/2021   NA 141 04/10/2021   K 3.3 (L) 04/10/2021   CL 103 04/10/2021   CREATININE 0.84 04/10/2021   BUN 17 04/10/2021   CO2 20 04/10/2021   TSH 2.960 08/08/2020   INR 2.0 04/16/2021   HGBA1C 6.8 (H) 04/10/2021   MICROALBUR 150 12/13/2020      Assessment & Plan:   1. Chronic systolic CHF (congestive heart failure) (HCC) The current medical regimen is effective;  continue present plan and medications. - Comprehensive metabolic panel  2. Mixed hyperlipidemia Well controlled.  No changes to medicines.  Continue to work on eating a healthy diet and exercise.  Labs drawn today.   - CBC with Differential/Platelet - Lipid panel  3. Diabetic glomerulopathy (HCC) Recommend check sugars fasting daily. Recommend check feet daily. Recommend annual eye exams. Continue to work on eating a healthy diet and exercise.  Labs drawn today.    - Hemoglobin A1c - blood glucose meter kit and supplies KIT; Check qam.  Dispense: 1 each; Refill: 0   4. Hypothyroidism: Continue synthroid.   5. Acquired thrombophilia: on warfarin.  6. Nonischemic cardiomyopathy:  The current medical regimen is effective;  continue present plan and medications. Management per  specialist.    Meds ordered this encounter  Medications  blood glucose meter kit and supplies KIT    Sig: Check qam.    Dispense:  1 each    Refill:  0    E11.69    Order Specific Question:   Number of strips    Answer:   100    Order Specific Question:   Number of lancets    Answer:   100    Orders Placed This Encounter  Procedures   CBC with Differential/Platelet   Hemoglobin A1c   Comprehensive metabolic panel   Lipid panel   Cardiovascular Risk Assessment   T4, free   Specimen status report     Follow-up: Return in about 3 months (around 07/11/2021) for fasting.  An After Visit Summary was printed and given to the patient.  I,Lauren M Auman,acting as a scribe for Rochel Brome, MD.,have documented all relevant documentation on the behalf of Rochel Brome, MD,as directed by  Rochel Brome, MD while in the presence of Rochel Brome, MD.    Rochel Brome, MD [place] [phone]

## 2021-04-11 LAB — CBC WITH DIFFERENTIAL/PLATELET
Basophils Absolute: 0.1 10*3/uL (ref 0.0–0.2)
Basos: 2 %
EOS (ABSOLUTE): 0.5 10*3/uL — ABNORMAL HIGH (ref 0.0–0.4)
Eos: 11 %
Hematocrit: 41.1 % (ref 34.0–46.6)
Hemoglobin: 13.5 g/dL (ref 11.1–15.9)
Immature Grans (Abs): 0 10*3/uL (ref 0.0–0.1)
Immature Granulocytes: 0 %
Lymphocytes Absolute: 1.1 10*3/uL (ref 0.7–3.1)
Lymphs: 26 %
MCH: 26.8 pg (ref 26.6–33.0)
MCHC: 32.8 g/dL (ref 31.5–35.7)
MCV: 82 fL (ref 79–97)
Monocytes Absolute: 0.4 10*3/uL (ref 0.1–0.9)
Monocytes: 9 %
Neutrophils Absolute: 2.3 10*3/uL (ref 1.4–7.0)
Neutrophils: 52 %
Platelets: 264 10*3/uL (ref 150–450)
RBC: 5.03 x10E6/uL (ref 3.77–5.28)
RDW: 14 % (ref 11.7–15.4)
WBC: 4.4 10*3/uL (ref 3.4–10.8)

## 2021-04-11 LAB — COMPREHENSIVE METABOLIC PANEL
ALT: 5 IU/L (ref 0–32)
AST: 13 IU/L (ref 0–40)
Albumin/Globulin Ratio: 0.7 — ABNORMAL LOW (ref 1.2–2.2)
Albumin: 3.4 g/dL — ABNORMAL LOW (ref 3.8–4.8)
Alkaline Phosphatase: 119 IU/L (ref 44–121)
BUN/Creatinine Ratio: 20 (ref 12–28)
BUN: 17 mg/dL (ref 8–27)
Bilirubin Total: 1.3 mg/dL — ABNORMAL HIGH (ref 0.0–1.2)
CO2: 20 mmol/L (ref 20–29)
Calcium: 9.2 mg/dL (ref 8.7–10.3)
Chloride: 103 mmol/L (ref 96–106)
Creatinine, Ser: 0.84 mg/dL (ref 0.57–1.00)
Globulin, Total: 5.1 g/dL — ABNORMAL HIGH (ref 1.5–4.5)
Glucose: 91 mg/dL (ref 65–99)
Potassium: 3.3 mmol/L — ABNORMAL LOW (ref 3.5–5.2)
Sodium: 141 mmol/L (ref 134–144)
Total Protein: 8.5 g/dL (ref 6.0–8.5)
eGFR: 77 mL/min/{1.73_m2} (ref >59)

## 2021-04-11 LAB — CARDIOVASCULAR RISK ASSESSMENT

## 2021-04-11 LAB — HEMOGLOBIN A1C
Est. average glucose Bld gHb Est-mCnc: 148 mg/dL
Hgb A1c MFr Bld: 6.8 % — ABNORMAL HIGH (ref 4.8–5.6)

## 2021-04-11 LAB — LIPID PANEL
Chol/HDL Ratio: 3.5 ratio (ref 0.0–4.4)
Cholesterol, Total: 76 mg/dL — ABNORMAL LOW (ref 100–199)
HDL: 22 mg/dL — ABNORMAL LOW (ref >39)
LDL Chol Calc (NIH): 42 mg/dL (ref 0–99)
Triglycerides: 45 mg/dL (ref 0–149)
VLDL Cholesterol Cal: 12 mg/dL (ref 5–40)

## 2021-04-16 ENCOUNTER — Ambulatory Visit (INDEPENDENT_AMBULATORY_CARE_PROVIDER_SITE_OTHER): Payer: Medicare Other

## 2021-04-16 ENCOUNTER — Other Ambulatory Visit: Payer: Self-pay

## 2021-04-16 DIAGNOSIS — Z9229 Personal history of other drug therapy: Secondary | ICD-10-CM | POA: Diagnosis not present

## 2021-04-16 DIAGNOSIS — Z5181 Encounter for therapeutic drug level monitoring: Secondary | ICD-10-CM | POA: Diagnosis not present

## 2021-04-16 DIAGNOSIS — Z7901 Long term (current) use of anticoagulants: Secondary | ICD-10-CM | POA: Diagnosis not present

## 2021-04-16 LAB — POCT INR: INR: 2 (ref 2.0–3.0)

## 2021-04-16 LAB — T4, FREE: Free T4: 1.57 ng/dL (ref 0.82–1.77)

## 2021-04-16 NOTE — Patient Instructions (Signed)
Description   Continue taking 1 tablet daily except 0.5 tablet on Monday, Wednesday and Friday. Be consistent with greens Recheck in 4 weeks

## 2021-04-23 ENCOUNTER — Telehealth: Payer: Self-pay | Admitting: Cardiology

## 2021-04-23 ENCOUNTER — Other Ambulatory Visit: Payer: Self-pay

## 2021-04-23 MED ORDER — MONTELUKAST SODIUM 10 MG PO TABS: 10.0000 mg | ORAL_TABLET | Freq: Every day | ORAL | 1 refills | Status: DC

## 2021-04-23 MED ORDER — CARVEDILOL 3.125 MG PO TABS: 3.1250 mg | ORAL_TABLET | Freq: Two times a day (BID) | ORAL | 3 refills | Status: DC

## 2021-04-23 MED ORDER — ALLOPURINOL 100 MG PO TABS: 100.0000 mg | ORAL_TABLET | Freq: Two times a day (BID) | ORAL | 1 refills | Status: DC

## 2021-04-23 MED ORDER — ATORVASTATIN CALCIUM 10 MG PO TABS: 10.0000 mg | ORAL_TABLET | Freq: Every day | ORAL | 3 refills | Status: DC

## 2021-04-23 NOTE — Telephone Encounter (Signed)
Refill of Atorvastatin 10 mg and Carvedilol 3.125 mg sent to [facility] on [address].

## 2021-04-23 NOTE — Telephone Encounter (Signed)
*  STAT* If patient is at the pharmacy, call can be transferred to refill team.   1. Which medications need to be refilled? (please list name of each medication and dose if known) atorvastatin (LIPITOR) 10 MG tablet carvedilol (COREG) 3.125 MG tablet  2. Which pharmacy/location (including street and city if local pharmacy) is medication to be sent to? [id number] - [place], [place] - [address] AT [address]  3. Do they need a 30 day or 90 day supply? 90 day

## 2021-04-30 ENCOUNTER — Ambulatory Visit: Payer: Medicare Other

## 2021-05-03 ENCOUNTER — Other Ambulatory Visit: Payer: Self-pay

## 2021-05-03 MED ORDER — LEVOTHYROXINE SODIUM 50 MCG PO TABS: 50.0000 ug | ORAL_TABLET | Freq: Every day | ORAL | 1 refills | Status: DC

## 2021-05-07 ENCOUNTER — Ambulatory Visit: Payer: Medicare Other

## 2021-05-07 ENCOUNTER — Ambulatory Visit (INDEPENDENT_AMBULATORY_CARE_PROVIDER_SITE_OTHER): Payer: Medicare Other | Admitting: Plastic Surgery

## 2021-05-07 ENCOUNTER — Other Ambulatory Visit: Payer: Self-pay

## 2021-05-07 ENCOUNTER — Encounter: Payer: Self-pay | Admitting: Plastic Surgery

## 2021-05-07 VITALS — BP 110/76 | HR 87 | Ht 68.0 in | Wt 164.4 lb

## 2021-05-07 DIAGNOSIS — S31109A Unspecified open wound of abdominal wall, unspecified quadrant without penetration into peritoneal cavity, initial encounter: Secondary | ICD-10-CM | POA: Insufficient documentation

## 2021-05-07 DIAGNOSIS — Z9229 Personal history of other drug therapy: Secondary | ICD-10-CM

## 2021-05-07 DIAGNOSIS — J9600 Acute respiratory failure, unspecified whether with hypoxia or hypercapnia: Secondary | ICD-10-CM

## 2021-05-07 DIAGNOSIS — I639 Cerebral infarction, unspecified: Secondary | ICD-10-CM

## 2021-05-07 DIAGNOSIS — N289 Disorder of kidney and ureter, unspecified: Secondary | ICD-10-CM

## 2021-05-07 DIAGNOSIS — R609 Edema, unspecified: Secondary | ICD-10-CM | POA: Diagnosis not present

## 2021-05-07 DIAGNOSIS — E1165 Type 2 diabetes mellitus with hyperglycemia: Secondary | ICD-10-CM

## 2021-05-07 DIAGNOSIS — R7303 Prediabetes: Secondary | ICD-10-CM

## 2021-05-07 NOTE — Progress Notes (Signed)
Patient ID: Breanna Bennett, female    DOB: 08-10-1956, 65 y.o.   MRN: [id number]   Chief Complaint  Patient presents with   Advice Only    The patient is a 65 year old female here with her daughter for evaluation of her abdomen.  She has been going to the wound center in [place] and getting treatment for lower extremity wounds.  She started developing firmness of her abdominal soft tissue and a small wound around her umbilicus.  She was sent for a second opinion.  She has had a CT scan which showed anasarcous.  The area is approximately 30 x 30 cm and includes the majority of her lower pannus.  There is a firmness in the soft tissue in the skin.  The area inferior to the umbilicus has a 1 to 2 cm wound.  It appears to be superficial.  The patient denies pain.  She says once in a while there is a sharp discomfort but it does not really cause her pain on any ongoing basis.  She has multiple medical conditions including mild kidney disease and diabetes.  She looks older than her stated age.  Her daughter is very helpful and attentive.  It does not appear to be infected at this time   Review of Systems  Constitutional:  Positive for activity change.  Eyes: Negative.   Respiratory:  Negative for shortness of breath.   Cardiovascular:  Positive for leg swelling.  Endocrine: Negative.   Musculoskeletal:  Positive for gait problem and joint swelling.  Neurological:  Negative for facial asymmetry.  Psychiatric/Behavioral:  Negative for behavioral problems.    Past Medical History:  Diagnosis Date   Acute CVA (cerebrovascular accident) ([place]) 05/30/2019   Acute respiratory failure (HCC)    Acute systolic CHF (congestive heart failure) ([place]) 01/12/2019   Cardiac cachexia 39/76/7341   Chronic systolic CHF (congestive heart failure) (HCC)    Class 2 severe obesity due to excess calories with serious comorbidity and body mass index (BMI) of 38.0 to 38.9 in adult ([place]) 10/19/2018   Cough due to  bronchospasm 02/16/2019   Elevated hemoglobin (HCC)    Elevated uric acid in blood 06/06/2019   Essential hypertension 01/12/2019   Gastroesophageal reflux disease 10/19/2018   GERD (gastroesophageal reflux disease)    History of Coumadin therapy 06/16/2019   History of CVA (cerebrovascular accident) 05/30/2019   History of gout 12/06/2019   History of hemiplegia 05/30/2019   Hospital discharge follow-up 06/24/2019   Hyperlipidemia    Hypertension    Hypokalemia 12/06/2019   Impaired mobility and ADLs 05/30/2019   Late effect of cerebrovascular accident (CVA) 12/12/2019   LBBB (left bundle branch block)    Left ventricular ejection fraction less than 20% 05/30/2019   Middle cerebral artery embolism, 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   Mild renal insufficiency    Mitral regurgitation    NICM (nonischemic cardiomyopathy) ([place])    Nonischemic cardiomyopathy (HCC) cardiac catheterization May 2020 showed normal coronaries, ejection fraction 30% 02/09/2019   Open wound 1 year ago   Bilateral legs   Perennial allergic rhinitis 02/16/2019   Peripheral edema 12/06/2019   Pre-diabetes    Stroke (cerebrum) ([place]) - R MCA infarct s/p mechanical thrombectomy 9/37/9024   Systolic CHF, acute ([place]) 02/09/2019   Tricuspid regurgitation    Type 2 diabetes mellitus with hyperglycemia, without long-term current use of insulin ([place]) 05/30/2019   Type 2 diabetes mellitus, uncontrolled ([place]) 05/27/2019    Past Surgical History:  Procedure Laterality  Date   IR ANGIO VERTEBRAL SEL SUBCLAVIAN INNOMINATE UNI R MOD SED  05/19/2019   IR CT HEAD LTD  05/19/2019   IR PERCUTANEOUS ART THROMBECTOMY/INFUSION INTRACRANIAL INC DIAG ANGIO  05/19/2019   RADIOLOGY WITH ANESTHESIA N/A 05/19/2019   Procedure: IR WITH ANESTHESIA;  Surgeon: Luanne Bras, MD;  Location: [facility];  Service: Radiology;  Laterality: N/A;   RIGHT/LEFT HEART CATH AND CORONARY ANGIOGRAPHY N/A 01/13/2019   Procedure: RIGHT/LEFT HEART CATH AND CORONARY ANGIOGRAPHY;  Surgeon:  Lorretta Harp, MD;  Location: [facility];  Service: Cardiovascular;  Laterality: N/A;   TUBAL LIGATION        Current Outpatient Medications:    Accu-Chek Softclix Lancets lancets, , Disp: , Rfl:    allopurinol (ZYLOPRIM) 100 MG tablet, Take 1 tablet (100 mg total) by mouth 2 (two) times daily., Disp: 180 tablet, Rfl: 1   atorvastatin (LIPITOR) 10 MG tablet, Take 1 tablet (10 mg total) by mouth daily., Disp: 90 tablet, Rfl: 3   blood glucose meter kit and supplies KIT, Check qam., Disp: 1 each, Rfl: 0   carvedilol (COREG) 3.125 MG tablet, Take 1 tablet (3.125 mg total) by mouth 2 (two) times daily with a meal., Disp: 180 tablet, Rfl: 3   dapagliflozin propanediol (FARXIGA) 5 MG TABS tablet, Take 1 tablet (5 mg total) by mouth daily before breakfast., Disp: 90 tablet, Rfl: 0   famotidine (PEPCID) 40 MG tablet, Take 1 tablet (40 mg total) by mouth at bedtime., Disp: 90 tablet, Rfl: 3   fluticasone (FLONASE) 50 MCG/ACT nasal spray, Place 2 sprays into both nostrils daily as needed for allergies or rhinitis., Disp: 18.2 mL, Rfl: 3   gentamicin ointment (GARAMYCIN) 0.1 %, Apply 1 application topically as directed. To the affected areas(legs) Open wounds, Disp: , Rfl:    levothyroxine (SYNTHROID) 50 MCG tablet, Take 1 tablet (50 mcg total) by mouth daily before breakfast., Disp: 90 tablet, Rfl: 1   loratadine (CLARITIN) 10 MG tablet, Take 1 tablet (10 mg total) by mouth daily as needed for allergies or rhinitis., Disp: 90 tablet, Rfl: 0   montelukast (SINGULAIR) 10 MG tablet, Take 1 tablet (10 mg total) by mouth at bedtime., Disp: 90 tablet, Rfl: 1   potassium chloride (KLOR-CON) 10 MEQ tablet, TAKE 1 TABLET(10 MEQ) BY MOUTH DAILY (Patient taking differently: Take 10 mEq by mouth daily.), Disp: 90 tablet, Rfl: 1   PROAIR HFA 108 (90 Base) MCG/ACT inhaler, Inhale 2 puffs into the lungs every 4 (four) hours as needed for shortness of breath or wheezing., Disp: , Rfl:    Vitamin D,  Ergocalciferol, (DRISDOL) 1.25 MG (50000 UT) CAPS capsule, Take 50,000 Units by mouth every Monday. , Disp: , Rfl:    warfarin (COUMADIN) 2.5 MG tablet, TAKE 1 TABLET BY MOUTH DAILY EXCEPT 1/2 TABLET ON MONDAY, WEDNESDAY, FRIDAY OR AS DIRECTED (Patient taking differently: Take 2.5 mg by mouth as directed. TAKE 1 TABLET BY MOUTH DAILY EXCEPT 1/2 TABLET ON MONDAY, WEDNESDAY, FRIDAY OR AS DIRECTED), Disp: 90 tablet, Rfl: 0   furosemide (LASIX) 40 MG tablet, Take 40 mg by mouth 2 (two) times daily., Disp: , Rfl:    spironolactone (ALDACTONE) 25 MG tablet, Take 0.5 tablets (12.5 mg total) by mouth once for 1 dose., Disp: 30 tablet, Rfl: 3   Objective:   Vitals:   05/07/21 1322  BP: 110/76  Pulse: 87  SpO2: 100%    Physical Exam Vitals reviewed.  HENT:     Head: Normocephalic.  Cardiovascular:     Rate and Rhythm: Normal rate.     Pulses: Normal pulses.  Pulmonary:     Effort: No respiratory distress.     Breath sounds: No stridor.  Abdominal:     General: Abdomen is flat. There is no distension.     Palpations: There is mass.     Tenderness: There is no abdominal tenderness. There is no guarding or rebound.  Musculoskeletal:        General: Swelling present.  Skin:    General: Skin is warm.     Coloration: Skin is not jaundiced.     Findings: Bruising and lesion present. No rash.  Neurological:     Mental Status: She is alert. Mental status is at baseline.  Psychiatric:        Mood and Affect: Mood normal.    Assessment & Plan:  Pre-diabetes  Peripheral edema  History of Coumadin therapy  Mild renal insufficiency  Uncontrolled type 2 diabetes mellitus with hyperglycemia (HCC)  Acute respiratory failure, unspecified whether with hypoxia or hypercapnia (HCC)  Acute CVA (cerebrovascular accident) ([place])  Open abdominal wall wound, initial encounter  We had a discussion about the possibilities for surgery and agreed that she would likely have a very difficult time  healing.  She would then have a large open area that she would have to deal with and may be painful.  She does not want to deal with that right now especially since its not hurting her at this time.  I think this is reasonable.  I spoke with Dr. Quentin Cornwall from the wound center and he is in agreement for the moment.  If anything changes I would want to see her back and we would reevaluate.  In the meantime she can use Vaseline on the superficial skin breakdown that is inferior to the umbilicus.  Pictures were obtained of the patient and placed in the chart with the patient's or guardian's permission.   Center, DO

## 2021-05-14 ENCOUNTER — Other Ambulatory Visit: Payer: Self-pay

## 2021-05-14 ENCOUNTER — Ambulatory Visit: Payer: Medicare Other

## 2021-05-14 ENCOUNTER — Ambulatory Visit: Payer: 59 | Admitting: Cardiology

## 2021-05-14 DIAGNOSIS — Z5181 Encounter for therapeutic drug level monitoring: Secondary | ICD-10-CM

## 2021-05-14 DIAGNOSIS — Z7901 Long term (current) use of anticoagulants: Secondary | ICD-10-CM | POA: Diagnosis not present

## 2021-05-14 DIAGNOSIS — I639 Cerebral infarction, unspecified: Secondary | ICD-10-CM

## 2021-05-14 DIAGNOSIS — Z9229 Personal history of other drug therapy: Secondary | ICD-10-CM

## 2021-05-14 LAB — POCT INR: INR: 2 (ref 2.0–3.0)

## 2021-05-14 NOTE — Patient Instructions (Signed)
Continue taking 1 tablet daily except 0.5 tablet on Monday, Wednesday and Friday. Be consistent with greens Recheck in 4 weeks

## 2021-05-16 ENCOUNTER — Encounter: Payer: Self-pay | Admitting: Neurology

## 2021-05-16 ENCOUNTER — Ambulatory Visit: Payer: 59 | Admitting: Neurology

## 2021-06-11 ENCOUNTER — Ambulatory Visit (INDEPENDENT_AMBULATORY_CARE_PROVIDER_SITE_OTHER): Payer: Medicare Other

## 2021-06-11 ENCOUNTER — Other Ambulatory Visit: Payer: Self-pay

## 2021-06-11 DIAGNOSIS — Z5181 Encounter for therapeutic drug level monitoring: Secondary | ICD-10-CM

## 2021-06-11 DIAGNOSIS — Z7901 Long term (current) use of anticoagulants: Secondary | ICD-10-CM

## 2021-06-11 DIAGNOSIS — Z9229 Personal history of other drug therapy: Secondary | ICD-10-CM

## 2021-06-11 LAB — POCT INR: INR: 1.9 — AB (ref 2.0–3.0)

## 2021-06-11 NOTE — Patient Instructions (Signed)
Description   Take 1.5 tablets today and then continue taking 1 tablet daily except 0.5 tablet on Monday, Wednesday and Friday. Be consistent with greens Recheck in 4 weeks

## 2021-06-15 ENCOUNTER — Other Ambulatory Visit: Payer: Self-pay | Admitting: Family Medicine

## 2021-06-15 ENCOUNTER — Other Ambulatory Visit: Payer: Self-pay | Admitting: Cardiology

## 2021-06-15 DIAGNOSIS — Z9229 Personal history of other drug therapy: Secondary | ICD-10-CM

## 2021-06-17 NOTE — Telephone Encounter (Signed)
Prescription refill request received for warfarin Lov: Bing Matter, 12/31/2020 Next INR check: 11/15 Warfarin tablet strength: 2.5 mg

## 2021-06-17 NOTE — Telephone Encounter (Signed)
Refill sent to pharmacy.   

## 2021-06-20 ENCOUNTER — Other Ambulatory Visit: Payer: Self-pay

## 2021-06-20 DIAGNOSIS — I5021 Acute systolic (congestive) heart failure: Secondary | ICD-10-CM

## 2021-06-20 DIAGNOSIS — E1165 Type 2 diabetes mellitus with hyperglycemia: Secondary | ICD-10-CM

## 2021-06-20 NOTE — Progress Notes (Signed)
Daughter in office due to expense of farxiga. Samples of farxiga given until next office visit. Also daughter would like referral to care coordination w/ Pharmacist for med management. Daughter manages medicines for pt.   Lorita Officer, [place] 06/20/21 10:52 AM

## 2021-06-21 ENCOUNTER — Telehealth: Payer: Self-pay | Admitting: Family Medicine

## 2021-06-21 NOTE — Chronic Care Management (AMB) (Signed)
  Chronic Care Management   Outreach Note  06/21/2021 Name: Breanna Bennett MRN: [id number] DOB: 30-Oct-1955  Referred by: Blane Ohara, MD Reason for referral : No chief complaint on file.   An unsuccessful telephone outreach was attempted today. The patient was referred to the pharmacist for assistance with care management and care coordination.   Follow Up Plan:   Tatjana Dellinger [employer] Scheduler

## 2021-06-24 ENCOUNTER — Telehealth: Payer: Self-pay | Admitting: Family Medicine

## 2021-06-24 NOTE — Progress Notes (Signed)
ERROR

## 2021-06-25 ENCOUNTER — Telehealth: Payer: Self-pay | Admitting: Family Medicine

## 2021-06-25 NOTE — Progress Notes (Signed)
ERROR

## 2021-06-26 ENCOUNTER — Telehealth: Payer: Self-pay | Admitting: Family Medicine

## 2021-06-26 NOTE — Chronic Care Management (AMB) (Signed)
  Chronic Care Management   Note  06/26/2021 Name: Breanna Bennett MRN: [id number] DOB: 1955/12/26  Breanna Bennett is a 65 y.o. year old female who is a primary care patient of Cox, Kirsten, MD. I reached out to [name] by phone today in response to a referral sent by [name] PCP, Cox, Kirsten, MD.   [name] was given information about Chronic Care Management services today including:  CCM service includes personalized support from designated clinical staff supervised by her physician, including individualized plan of care and coordination with other care providers 24/7 contact phone numbers for assistance for urgent and routine care needs. Service will only be billed when office clinical staff spend 20 minutes or more in a month to coordinate care. Only one practitioner may furnish and bill the service in a calendar month. The patient may stop CCM services at any time (effective at the end of the month) by phone call to the office staff.   Patient agreed to services and verbal consent obtained.   Follow up plan:   Tatjana Restaurant manager, fast food

## 2021-07-01 ENCOUNTER — Telehealth: Payer: Self-pay

## 2021-07-01 NOTE — Chronic Care Management (AMB) (Signed)
Chronic Care Management Pharmacy Assistant   Name: Breanna Bennett  MRN: [id number] DOB: 05-24-1956   Reason for Encounter: Chart Prep for initial appt with CPP  on 07/10/21   Conditions to be addressed/monitored: CHF, DMII, and GERD, Stroke, Hypertension, Nonischemic Cardiomyopathy, Obesity, Hyperlipidemia,  Left bundle branch block  Recent office visits:  06/20/21 Orders Only.  Farxiga samples given.   04/10/21 Rochel Brome MD. Seen for CHF. No med changes   02/04/21 Reinaldo Meeker MD. Seen for middle cerebral artery embolism. Referral to Neurology. No med changes.   Recent consult visits:  06/11/21 Anti-Coag visit. Prudencio Pair RN. Take 1.5 tablets today and then continue taking 1 tablet daily except 0.5 tablet on Monday, Wednesday and Friday. Be consistent with greens. Recheck in 4 weeks  05/14/21 Anti-Coag visit. Sheppard Penton. Continue taking 1 tablet daily except 0.5 tablet on Monday, Wednesday and Friday. Be consistent with greens Recheck in 4 weeks.  05/07/21 (Plastic Surgery) Audelia Hives DO. Seen for Advice Only. Increased Furosemide from 20 mg to 40 mg 2 times daily.  04/16/21 Anti-Coag visit. Prudencio Pair RN. Continue taking 1 tablet daily except 0.5 tablet on Monday, Wednesday and Friday. Be consistent with greens  Recheck in 4 weeks  04/05/21 (Cardiology) Jenne Campus MD. Seen for Nonischemic Cardiomyopathy. No med changes.   03/26/21 Anti-Coag visit. Sheppard Penton. Take 2 tablets tonight only and then increase to 1 tablet daily except 0.5 tablet on Monday, Wednesday and Friday  Be consistent with greens. Recheck in 3 weeks  03/08/21 (Wound Care) Jeri Cos Eday lll PA-C. No med changes.  02/21/21 (Wound Care) Jeri Cos Eday lll PA-C. No med changes.  02/12/21 Anti-Coag visit. Sheppard Penton. Take 2 tablets tonight only and then Continue taking 1/2 tablet daily except 1 tablet on Sundays, Tuesdays and Thursdays. Be consistent with greens.  Recheck in 6 weeks  02/07/21 (Wound Care) Jeri Cos Eday lll PA-C. No med changes.  01/09/21 (Wound Care) Linton Ham MD. No med changes.   01/02/21 (Wound Care) Linton Ham MD. No med changes.   01/01/21 Anti-Coag visit. Cherlynn Kaiser MD. Continue taking 1/2 tablet daily except 1 tablet on Sundays, Tuesdays and Thursdays Be consistent with greens. Recheck in 6 weeks  12/31/20 (Cardiology) Jenne Campus MD. Seen for Nonischemic Cardiomyopathy. Completed course of Singulair 10 mg and Macrobid 100 mg 2 times daily.  Hospital visits:  None in previous 6 months  Medications: Outpatient Encounter Medications as of 07/01/2021  Medication Sig   Accu-Chek Softclix Lancets lancets    allopurinol (ZYLOPRIM) 100 MG tablet Take 1 tablet (100 mg total) by mouth 2 (two) times daily.   atorvastatin (LIPITOR) 10 MG tablet Take 1 tablet (10 mg total) by mouth daily.   blood glucose meter kit and supplies KIT Check qam.   carvedilol (COREG) 3.125 MG tablet Take 1 tablet (3.125 mg total) by mouth 2 (two) times daily with a meal.   famotidine (PEPCID) 40 MG tablet Take 1 tablet (40 mg total) by mouth at bedtime.   FARXIGA 5 MG TABS tablet TAKE 1 TABLET(5 MG) BY MOUTH DAILY BEFORE BREAKFAST   fluticasone (FLONASE) 50 MCG/ACT nasal spray Place 2 sprays into both nostrils daily as needed for allergies or rhinitis.   furosemide (LASIX) 40 MG tablet Take 40 mg by mouth 2 (two) times daily.   gentamicin ointment (GARAMYCIN) 0.1 % Apply 1 application topically as directed. To the affected areas(legs) Open wounds   levothyroxine (SYNTHROID) 50 MCG tablet Take  1 tablet (50 mcg total) by mouth daily before breakfast.   loratadine (CLARITIN) 10 MG tablet Take 1 tablet (10 mg total) by mouth daily as needed for allergies or rhinitis.   montelukast (SINGULAIR) 10 MG tablet Take 1 tablet (10 mg total) by mouth at bedtime.   potassium chloride (KLOR-CON) 10 MEQ tablet TAKE 1 TABLET(10 MEQ) BY MOUTH DAILY  (Patient taking differently: Take 10 mEq by mouth daily.)   PROAIR HFA 108 (90 Base) MCG/ACT inhaler Inhale 2 puffs into the lungs every 4 (four) hours as needed for shortness of breath or wheezing.   spironolactone (ALDACTONE) 25 MG tablet Take 0.5 tablets (12.5 mg total) by mouth once for 1 dose.   Vitamin D, Ergocalciferol, (DRISDOL) 1.25 MG (50000 UT) CAPS capsule Take 50,000 Units by mouth every Monday.    warfarin (COUMADIN) 2.5 MG tablet Take 1/2 a tablet to 1 tablet by mouth daily as directed by the coumadin clinic   No facility-administered encounter medications on file as of 07/01/2021.     Lab Results  Component Value Date/Time   HGBA1C 6.8 (H) 04/10/2021 10:16 AM   HGBA1C 6.9 (H) 12/13/2020 11:29 AM   MICROALBUR 150 12/13/2020 11:39 AM     BP Readings from Last 3 Encounters:  05/07/21 110/76  04/10/21 102/68  04/05/21 102/60     Lab Results  Component Value Date/Time   HGBA1C 6.8 (H) 04/10/2021 10:16 AM   HGBA1C 6.9 (H) 12/13/2020 11:29 AM   MICROALBUR 150 12/13/2020 11:39 AM     BP Readings from Last 3 Encounters:  05/07/21 110/76  04/10/21 102/68  04/05/21 102/60    Patient Questions:  Have you seen any other providers since your last visit with PCP?  Pt stated no other providers   Any changes in your medications or health? Pt stated everything is the same  Any side effects from any medications? Pt stated no side effects  Do you have an symptoms or problems not managed by your medications? Pt stated no issues   Any concerns about your health right now? Pt stated no concerns   Has your provider asked that you check blood pressure, blood sugar, or follow special diet at home? Pt stated she checks BP and BS daily.   Do you get any type of exercise on a regular basis? Pt stated she does a lot around the house.   Can you think of a goal you would like to reach for your health? Pt stated her legs are constantly leaking fluid and wished they would stop.    Do you have any problems getting your medications? Pt stated she is getting samples of her [place] and that its very expensive to keep paying for. She wants to discuss possible PAP.   Is there anything that you would like to discuss during the appointment? Pt wants to discuss medication cost    Vadis Slabach was reminded to have all medications, supplements and any blood glucose and blood pressure readings available for review with [place] Constable. D, at her telephone visit on 07/10/21 at 11:00 am .    Star Rating Drugs:  Medication:  Last Fill: Day Supply Farxiga   04/11/21 90ds Atorvastatin   05/24/21 90ds     Care Gaps: Last annual wellness visit? Scheduled 43/32/95 If applicable: Last eye exam / retinopathy screening? Never done  Last diabetic foot exam? Never done     Elray Mcgregor, [place] Pharmacist Assistant  [phone]

## 2021-07-05 ENCOUNTER — Ambulatory Visit: Payer: Medicare Other | Admitting: Cardiology

## 2021-07-08 ENCOUNTER — Ambulatory Visit (INDEPENDENT_AMBULATORY_CARE_PROVIDER_SITE_OTHER): Payer: Medicare Other | Admitting: Cardiology

## 2021-07-08 ENCOUNTER — Encounter: Payer: Self-pay | Admitting: Cardiology

## 2021-07-08 ENCOUNTER — Other Ambulatory Visit: Payer: Self-pay

## 2021-07-08 VITALS — BP 90/70 | HR 79 | Ht 68.0 in | Wt 158.0 lb

## 2021-07-08 DIAGNOSIS — R300 Dysuria: Secondary | ICD-10-CM

## 2021-07-08 DIAGNOSIS — E1165 Type 2 diabetes mellitus with hyperglycemia: Secondary | ICD-10-CM

## 2021-07-08 DIAGNOSIS — I340 Nonrheumatic mitral (valve) insufficiency: Secondary | ICD-10-CM | POA: Diagnosis not present

## 2021-07-08 DIAGNOSIS — I519 Heart disease, unspecified: Secondary | ICD-10-CM | POA: Diagnosis not present

## 2021-07-08 DIAGNOSIS — I428 Other cardiomyopathies: Secondary | ICD-10-CM | POA: Diagnosis not present

## 2021-07-08 NOTE — Addendum Note (Signed)
Addended by: Hazle Quant on: 07/08/2021 03:25 PM   Modules accepted: Orders

## 2021-07-08 NOTE — Patient Instructions (Signed)
Medication Instructions:  Your physician recommends that you continue on your current medications as directed. Please refer to the Current Medication list given to you today.  *If you need a refill on your cardiac medications before your next appointment, please call your pharmacy*   Lab Work: Your physician recommends that you return for lab work today; CMP, PR BNP, URINE SAMPLE If you have labs (blood work) drawn today and your tests are completely normal, you will receive your results only by: MyChart Message (if you have MyChart) OR A paper copy in the mail If you have any lab test that is abnormal or we need to change your treatment, we will call you to review the results.   Testing/Procedures: none   Follow-Up: At [facility], you and your health needs are our priority.  As part of our continuing mission to provide you with exceptional heart care, we have created designated Provider Care Teams.  These Care Teams include your primary Cardiologist (physician) and Advanced Practice Providers (APPs -  Physician Assistants and Nurse Practitioners) who all work together to provide you with the care you need, when you need it.  We recommend signing up for the patient portal called "MyChart".  Sign up information is provided on this After Visit Summary.  MyChart is used to connect with patients for Virtual Visits (Telemedicine).  Patients are able to view lab/test results, encounter notes, upcoming appointments, etc.  Non-urgent messages can be sent to your provider as well.   To learn more about what you can do with MyChart, go to [email or web address].    Your next appointment:   3 month(s)  The format for your next appointment:   In Person  Provider:   Gypsy Balsam, MD    Other Instructions

## 2021-07-08 NOTE — Progress Notes (Signed)
Cardiology Office Note:    Date:  07/08/2021   ID:  Breanna Bennett, DOB 01-07-1956, MRN [id number]  PCP:  Blane Ohara, MD  Cardiologist:  Gypsy Balsam, MD    Referring MD: Blane Ohara, MD   Chief Complaint  Patient presents with   Follow-up  I am here for regular follow-up  History of Present Illness:    Breanna Bennett is a 65 y.o. female   with past medical history significant for cardiomyopathy which is nonischemic ejection fraction 2025%, history of CVA, left bundle branch block, dyslipidemia, essential hypertension.  She refused any intervention, she refused any testing.  The most emerging issue right now is swelling of lower extremities that is getting worse as she got a lot of protein leak from her legs She comes today to my office for follow-up.  Overall she seems to be doing fine.  She still got her legs wrapped up with bandages.  Still have difficulty with some shortness of breath but overall is very cheerful and happy.  Past Medical History:  Diagnosis Date   Acute CVA (cerebrovascular accident) (HCC) 05/30/2019   Acute respiratory failure (HCC)    Acute systolic CHF (congestive heart failure) (HCC) 01/12/2019   Cardiac cachexia 07/04/2020   Chronic systolic CHF (congestive heart failure) (HCC)    Class 2 severe obesity due to excess calories with serious comorbidity and body mass index (BMI) of 38.0 to 38.9 in adult (HCC) 10/19/2018   Cough due to bronchospasm 02/16/2019   Elevated hemoglobin (HCC)    Elevated uric acid in blood 06/06/2019   Essential hypertension 01/12/2019   Gastroesophageal reflux disease 10/19/2018   GERD (gastroesophageal reflux disease)    History of Coumadin therapy 06/16/2019   History of CVA (cerebrovascular accident) 05/30/2019   History of gout 12/06/2019   History of hemiplegia 05/30/2019   Hospital discharge follow-up 06/24/2019   Hyperlipidemia    Hypertension    Hypokalemia 12/06/2019   Impaired mobility and ADLs 05/30/2019   Late  effect of cerebrovascular accident (CVA) 12/12/2019   LBBB (left bundle branch block)    Left ventricular ejection fraction less than 20% 05/30/2019   Middle cerebral artery embolism, 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   Mild renal insufficiency    Mitral regurgitation    NICM (nonischemic cardiomyopathy) (HCC)    Nonischemic cardiomyopathy (HCC) cardiac catheterization May 2020 showed normal coronaries, ejection fraction 30% 02/09/2019   Open wound 1 year ago   Bilateral legs   Perennial allergic rhinitis 02/16/2019   Peripheral edema 12/06/2019   Pre-diabetes    Stroke (cerebrum) (HCC) - R MCA infarct s/p mechanical thrombectomy 05/19/2019   Systolic CHF, acute (HCC) 02/09/2019   Tricuspid regurgitation    Type 2 diabetes mellitus with hyperglycemia, without long-term current use of insulin (HCC) 05/30/2019   Type 2 diabetes mellitus, uncontrolled 05/27/2019    Past Surgical History:  Procedure Laterality Date   IR ANGIO VERTEBRAL SEL SUBCLAVIAN INNOMINATE UNI R MOD SED  05/19/2019   IR CT HEAD LTD  05/19/2019   IR PERCUTANEOUS ART THROMBECTOMY/INFUSION INTRACRANIAL INC DIAG ANGIO  05/19/2019   RADIOLOGY WITH ANESTHESIA N/A 05/19/2019   Procedure: IR WITH ANESTHESIA;  Surgeon: Julieanne Cotton, MD;  Location: [facility] OR;  Service: Radiology;  Laterality: N/A;   RIGHT/LEFT HEART CATH AND CORONARY ANGIOGRAPHY N/A 01/13/2019   Procedure: RIGHT/LEFT HEART CATH AND CORONARY ANGIOGRAPHY;  Surgeon: Runell Gess, MD;  Location: [facility] INVASIVE CV LAB;  Service: Cardiovascular;  Laterality: N/A;   TUBAL LIGATION  Current Medications: Current Meds  Medication Sig   allopurinol (ZYLOPRIM) 100 MG tablet Take 1 tablet (100 mg total) by mouth 2 (two) times daily.   atorvastatin (LIPITOR) 10 MG tablet Take 1 tablet (10 mg total) by mouth daily.   carvedilol (COREG) 3.125 MG tablet Take 1 tablet (3.125 mg total) by mouth 2 (two) times daily with a meal.   famotidine (PEPCID) 40 MG tablet Take 1 tablet (40 mg total) by  mouth at bedtime.   FARXIGA 5 MG TABS tablet TAKE 1 TABLET(5 MG) BY MOUTH DAILY BEFORE BREAKFAST   fluticasone (FLONASE) 50 MCG/ACT nasal spray Place 2 sprays into both nostrils daily as needed for allergies or rhinitis.   furosemide (LASIX) 40 MG tablet Take 40 mg by mouth 2 (two) times daily.   gentamicin ointment (GARAMYCIN) 0.1 % Apply 1 application topically as directed. To the affected areas(legs) Open wounds   levothyroxine (SYNTHROID) 50 MCG tablet Take 1 tablet (50 mcg total) by mouth daily before breakfast.   loratadine (CLARITIN) 10 MG tablet Take 1 tablet (10 mg total) by mouth daily as needed for allergies or rhinitis.   montelukast (SINGULAIR) 10 MG tablet Take 1 tablet (10 mg total) by mouth at bedtime.   potassium chloride (KLOR-CON) 10 MEQ tablet TAKE 1 TABLET(10 MEQ) BY MOUTH DAILY   PROAIR HFA 108 (90 Base) MCG/ACT inhaler Inhale 2 puffs into the lungs every 4 (four) hours as needed for shortness of breath or wheezing.   spironolactone (ALDACTONE) 25 MG tablet Take 0.5 tablets (12.5 mg total) by mouth once for 1 dose.   Vitamin D, Ergocalciferol, (DRISDOL) 1.25 MG (50000 UT) CAPS capsule Take 50,000 Units by mouth every Monday.    warfarin (COUMADIN) 2.5 MG tablet Take 1/2 a tablet to 1 tablet by mouth daily as directed by the coumadin clinic     Allergies:   Lisinopril and Losartan   Social History   Socioeconomic History   Marital status: Unknown    Spouse name: Not on file   Number of children: Not on file   Years of education: Not on file   Highest education level: Not on file  Occupational History   Not on file  Tobacco Use   Smoking status: Never   Smokeless tobacco: Never  Vaping Use   Vaping Use: Never used  Substance and Sexual Activity   Alcohol use: Never   Drug use: Never   Sexual activity: Not on file  Other Topics Concern   Not on file  Social History Narrative   Not on file   Social Determinants of Health   Financial Resource Strain: Not on  file  Food Insecurity: Not on file  Transportation Needs: Not on file  Physical Activity: Not on file  Stress: Not on file  Social Connections: Not on file     Family History: The patient's family history includes CAD in her mother; Cancer in her brother; Colon cancer in her father; Heart failure in her mother; Hypertension in her mother. ROS:   Please see the history of present illness.    All 14 point review of systems negative except as described per history of present illness  EKGs/Labs/Other Studies Reviewed:      Recent Labs: 08/08/2020: BNP 2,850.2; TSH 2.960 04/10/2021: ALT 5; BUN 17; Creatinine, Ser 0.84; Hemoglobin 13.5; Platelets 264; Potassium 3.3; Sodium 141  Recent Lipid Panel    Component Value Date/Time   CHOL 76 (L) 04/10/2021 1016   TRIG 45 04/10/2021 1016  HDL 22 (L) 04/10/2021 1016   CHOLHDL 3.5 04/10/2021 1016   CHOLHDL 6.4 05/20/2019 0441   VLDL 18 05/20/2019 0441   LDLCALC 42 04/10/2021 1016    Physical Exam:    VS:  BP 90/70 (BP Location: Right Arm, Patient Position: Sitting, Cuff Size: Small)   Pulse 79   Ht 5\' 8"  (1.727 m)   Wt 158 lb (71.7 kg)   SpO2 99%   BMI 24.02 kg/m     Wt Readings from Last 3 Encounters:  07/08/21 158 lb (71.7 kg)  05/07/21 164 lb 6.4 oz (74.6 kg)  04/10/21 162 lb 14.4 oz (73.9 kg)     GEN:  Well nourished, well developed in no acute distress HEENT: Normal NECK: No JVD; No carotid bruits LYMPHATICS: No lymphadenopathy CARDIAC: RRR, no murmurs, no rubs, no gallops RESPIRATORY:  Clear to auscultation without rales, wheezing or rhonchi  ABDOMEN: Soft, non-tender, non-distended MUSCULOSKELETAL:  No edema; No deformity  SKIN: Warm and dry LOWER EXTREMITIES: 2+ swelling NEUROLOGIC:  Alert and oriented x 3 PSYCHIATRIC:  Normal affect   ASSESSMENT:    1. Nonischemic cardiomyopathy [facility]) cardiac catheterization May 2020 showed normal coronaries, ejection fraction 30%   2. Nonrheumatic mitral valve regurgitation    3. Type 2 diabetes mellitus with hyperglycemia, without long-term current use of insulin (HCC)   4. Cardiac cachexia    PLAN:    In order of problems listed above:  Cardiomyopathy which is nonischemic with severely reduced left ventricle ejection fraction.  Not a candidate for intervention on top of that she refused any intervention and any testing.  Luckily looks like she is stable overall there is no significant changes within the last few months.  I will check a proBNP as well as Chem-7 today.  We will continue guideline directed medical therapy.  Because of his difficulty with facing is her blood pressure being low which prevented me from putting her on appropriate dosages of all this medications. Nonrheumatic mitral valve regurgitation obviously related to significant cardiomyopathy.  Again Chem-7 proBNP will be checked today. Type 2 diabetes followed by internal medicine team.  Last hemoglobin A1c is 6.8 this is from 04/10/2021. Dyslipidemia I did review her K PN which show me her LDL of 42 HDL 22.  We will continue present management.   Overall in spite of her advanced disease she seems to be doing reasonably stable.   Medication Adjustments/Labs and Tests Ordered: Current medicines are reviewed at length with the patient today.  Concerns regarding medicines are outlined above.  No orders of the defined types were placed in this encounter.  Medication changes: No orders of the defined types were placed in this encounter.   Signed, 04/12/2021, MD, [facility] 07/08/2021 3:08 PM    [facility]

## 2021-07-09 ENCOUNTER — Ambulatory Visit (INDEPENDENT_AMBULATORY_CARE_PROVIDER_SITE_OTHER): Payer: Medicare Other

## 2021-07-09 ENCOUNTER — Telehealth: Payer: Self-pay

## 2021-07-09 DIAGNOSIS — Z5181 Encounter for therapeutic drug level monitoring: Secondary | ICD-10-CM | POA: Diagnosis not present

## 2021-07-09 DIAGNOSIS — Z7901 Long term (current) use of anticoagulants: Secondary | ICD-10-CM

## 2021-07-09 DIAGNOSIS — Z9229 Personal history of other drug therapy: Secondary | ICD-10-CM | POA: Diagnosis not present

## 2021-07-09 LAB — URINALYSIS
Bilirubin, UA: NEGATIVE
Ketones, UA: NEGATIVE
Nitrite, UA: POSITIVE — AB
Specific Gravity, UA: 1.014 (ref 1.005–1.030)
Urobilinogen, Ur: 1 mg/dL (ref 0.2–1.0)
pH, UA: 6 (ref 5.0–7.5)

## 2021-07-09 LAB — COMPREHENSIVE METABOLIC PANEL
ALT: 4 IU/L (ref 0–32)
AST: 15 IU/L (ref 0–40)
Albumin/Globulin Ratio: 0.6 — ABNORMAL LOW (ref 1.2–2.2)
Albumin: 3.5 g/dL — ABNORMAL LOW (ref 3.8–4.8)
Alkaline Phosphatase: 117 IU/L (ref 44–121)
BUN/Creatinine Ratio: 25 (ref 12–28)
BUN: 21 mg/dL (ref 8–27)
Bilirubin Total: 1.6 mg/dL — ABNORMAL HIGH (ref 0.0–1.2)
CO2: 20 mmol/L (ref 20–29)
Calcium: 9.2 mg/dL (ref 8.7–10.3)
Chloride: 100 mmol/L (ref 96–106)
Creatinine, Ser: 0.84 mg/dL (ref 0.57–1.00)
Globulin, Total: 5.6 g/dL — ABNORMAL HIGH (ref 1.5–4.5)
Glucose: 101 mg/dL — ABNORMAL HIGH (ref 70–99)
Potassium: 3.3 mmol/L — ABNORMAL LOW (ref 3.5–5.2)
Sodium: 136 mmol/L (ref 134–144)
Total Protein: 9.1 g/dL — ABNORMAL HIGH (ref 6.0–8.5)
eGFR: 77 mL/min/{1.73_m2} (ref >59)

## 2021-07-09 LAB — POCT INR: INR: 1.7 — AB (ref 2.0–3.0)

## 2021-07-09 LAB — PRO B NATRIURETIC PEPTIDE: NT-Pro BNP: 3562 pg/mL — ABNORMAL HIGH (ref 0–301)

## 2021-07-09 NOTE — Progress Notes (Signed)
I called pt to remind of appt with CPP tomorrow but pt did not answer or vm available   Roxana Hires, [facility] Clinical Pharmacist Assistant  [phone]

## 2021-07-09 NOTE — Patient Instructions (Signed)
Description   Take 1.5 tablets today and then START taking 1 tablet daily except 0.5 tablet on Mondays and Fridays. Be consistent with greens Recheck in 2 weeks

## 2021-07-10 ENCOUNTER — Other Ambulatory Visit: Payer: Self-pay | Admitting: Emergency Medicine

## 2021-07-10 ENCOUNTER — Ambulatory Visit (INDEPENDENT_AMBULATORY_CARE_PROVIDER_SITE_OTHER): Payer: Medicare Other

## 2021-07-10 ENCOUNTER — Telehealth: Payer: Self-pay | Admitting: Emergency Medicine

## 2021-07-10 DIAGNOSIS — Z79899 Other long term (current) drug therapy: Secondary | ICD-10-CM

## 2021-07-10 MED ORDER — POTASSIUM CHLORIDE ER 20 MEQ PO TBCR: EXTENDED_RELEASE_TABLET | ORAL | 1 refills | Status: DC

## 2021-07-10 MED ORDER — DAPAGLIFLOZIN PROPANEDIOL 5 MG PO TABS: ORAL_TABLET | ORAL | 1 refills | Status: DC

## 2021-07-10 NOTE — Telephone Encounter (Signed)
Patient informed of results. She will increase potassium to 20 meq daily and repeat labs in 1 week. Also routed urine to pcp to treat for uti. Patient daughter was on the call as well. No further questions.

## 2021-07-10 NOTE — Progress Notes (Signed)
Chronic Care Management Pharmacy Note  07/10/2021 Name:  Breanna Bennett MRN:  [id number] DOB:  1956/08/04  Summary: -Pleasant 65 year old female presents for initial CCM visit. Married since 1975, has 3 children (son passed March 2022) and 2 great grandchildren (54 and 82 years old). Was a CNA for 30 years  Recommendations/Changes made from today's visit: -Patient can't afford Wilder Glade, coordinated with front staff to get samples in set aside for her. Will start PAP ASAP -To prevent this from happening next year, patient will start using [facility] so we can monitor if any meds are sent that are Tier 3 and prevent her from hitting the [facility]. Will ask PCP to send in new scripts (90 day fills preferably) to [facility]. -No Uric Acid Hx, will ask PCP for Uric Acid Labs and if patient can take Allopurinol 173m 2QD as opposed to 1BID to ease pill burden   Subjective: Breanna Bennett an 65y.o. year old female who is a primary patient of Cox, Kirsten, MD.  The CCM team was consulted for assistance with disease management and care coordination needs.    Engaged with patient by telephone for initial visit in response to provider referral for pharmacy case management and/or care coordination services.   Consent to Services:  The patient was given the following information about Chronic Care Management services today, agreed to services, and gave verbal consent: 1. CCM service includes personalized support from designated clinical staff supervised by the primary care provider, including individualized plan of care and coordination with other care providers 2. 24/7 contact phone numbers for assistance for urgent and routine care needs. 3. Service will only be billed when office clinical staff spend 20 minutes or more in a month to coordinate care. 4. Only one practitioner may furnish and bill the service in a calendar month. 5.The patient may stop CCM services at any time (effective at the end of the month)  by phone call to the office staff. 6. The patient will be responsible for cost sharing (co-pay) of up to 20% of the service fee (after annual deductible is met). Patient agreed to services and consent obtained.  Patient Care Team: CRochel Brome MD as PCP - General (Family Medicine) KPark Liter MD as PCP - Cardiology (Cardiology) SSan Morelle PA-C as Physician Assistant (Physician Assistant) KLane Hacker [facility]West[facility] Hospitalas Pharmacist (Pharmacist)  Recent office visits:  06/20/21 Orders Only.  Farxiga samples given.    04/10/21 CRochel BromeMD. Seen for CHF. No med changes    02/04/21 PReinaldo MeekerMD. Seen for middle cerebral artery embolism. Referral to Neurology. No med changes.    Recent consult visits:  06/11/21 Anti-Coag visit. BPrudencio PairRN. Take 1.5 tablets today and then continue taking 1 tablet daily except 0.5 tablet on Monday, Wednesday and Friday. Be consistent with greens. Recheck in 4 weeks   05/14/21 Anti-Coag visit. DSheppard Penton Continue taking 1 tablet daily except 0.5 tablet on Monday, Wednesday and Friday. Be consistent with greens Recheck in 4 weeks.   05/07/21 (Plastic Surgery) DAudelia HivesDO. Seen for Advice Only. Increased Furosemide from 20 mg to 40 mg 2 times daily.   04/16/21 Anti-Coag visit. BPrudencio PairRN. Continue taking 1 tablet daily except 0.5 tablet on Monday, Wednesday and Friday. Be consistent with greens  Recheck in 4 weeks   04/05/21 (Cardiology) KJenne CampusMD. Seen for Nonischemic Cardiomyopathy. No med changes.    03/26/21 Anti-Coag visit. DSheppard Penton Take 2 tablets  tonight only and then increase to 1 tablet daily except 0.5 tablet on Monday, Wednesday and Friday  Be consistent with greens. Recheck in 3 weeks   03/08/21 (Wound Care) Jeri Cos Eday lll PA-C. No med changes.   02/21/21 (Wound Care) Jeri Cos Eday lll PA-C. No med changes.   02/12/21 Anti-Coag visit. Sheppard Penton. Take 2 tablets tonight  only and then Continue taking 1/2 tablet daily except 1 tablet on Sundays, Tuesdays and Thursdays. Be consistent with greens. Recheck in 6 weeks   02/07/21 (Wound Care) Jeri Cos Eday lll PA-C. No med changes.   01/09/21 (Wound Care) Linton Ham MD. No med changes.    01/02/21 (Wound Care) Linton Ham MD. No med changes.    01/01/21 Anti-Coag visit. Cherlynn Kaiser MD. Continue taking 1/2 tablet daily except 1 tablet on Sundays, Tuesdays and Thursdays Be consistent with greens. Recheck in 6 weeks   12/31/20 (Cardiology) Jenne Campus MD. Seen for Nonischemic Cardiomyopathy. Completed course of Singulair 10 mg and Macrobid 100 mg 2 times daily.   Hospital visits:  None in previous 6 months   Objective:  Lab Results  Component Value Date   CREATININE 0.84 07/08/2021   BUN 21 07/08/2021   GFRNONAA 65 10/11/2020   GFRAA 75 10/11/2020   NA 136 07/08/2021   K 3.3 (L) 07/08/2021   CALCIUM 9.2 07/08/2021   CO2 20 07/08/2021   GLUCOSE 101 (H) 07/08/2021    Lab Results  Component Value Date/Time   HGBA1C 6.8 (H) 04/10/2021 10:16 AM   HGBA1C 6.9 (H) 12/13/2020 11:29 AM   MICROALBUR 150 12/13/2020 11:39 AM    Last diabetic Eye exam: No results found for: HMDIABEYEEXA  Last diabetic Foot exam: No results found for: HMDIABFOOTEX   Lab Results  Component Value Date   CHOL 76 (L) 04/10/2021   HDL 22 (L) 04/10/2021   LDLCALC 42 04/10/2021   TRIG 45 04/10/2021   CHOLHDL 3.5 04/10/2021    Hepatic Function Latest Ref Rng & Units 07/08/2021 04/10/2021 12/13/2020  Total Protein 6.0 - 8.5 g/dL 9.1(H) 8.5 7.6  Albumin 3.8 - 4.8 g/dL 3.5(L) 3.4(L) 2.8(L)  AST 0 - 40 IU/L _0 ALT 0 - 32 IU/L _1 Alk Phosphatase 44 - 121 IU/L 117 119 104  Total Bilirubin 0.0 - 1.2 mg/dL 1.6(H) 1.3(H) 1.4(H)  Bilirubin, Direct 0.0 - 0.2 mg/dL - - -    Lab Results  Component Value Date/Time   TSH 2.960 08/08/2020 03:55 PM   TSH 3.320 04/17/2020 04:26 PM   FREET4 1.57 04/10/2021 10:16  AM   FREET4 1.60 01/15/2019 03:09 AM    CBC Latest Ref Rng & Units 04/10/2021 12/13/2020 08/08/2020  WBC 3.4 - 10.8 x10E3/uL 4.4 6.1 5.9  Hemoglobin 11.1 - 15.9 g/dL 13.5 13.3 12.5  Hematocrit 34.0 - 46.6 % 41.1 42.0 38.5  Platelets 150 - 450 x10E3/uL 264 336 316    No results found for: VD25OH  Clinical ASCVD: Yes  The ASCVD Risk score (Arnett DK, et al., 2019) failed to calculate for the following reasons:   The patient has a prior MI or stroke diagnosis    Depression screen [facility] 2/9 12/13/2020 09/12/2020 08/08/2020  Decreased Interest 0 0 0  Down, Depressed, Hopeless 0 0 0  PHQ - 2 Score 0 0 0     Other: (CHADS2VASc if Afib, MMRC or CAT for COPD, ACT, DEXA)  Social History   Tobacco Use  Smoking Status Never  Smokeless  Tobacco Never   BP Readings from Last 3 Encounters:  07/08/21 90/70  05/07/21 110/76  04/10/21 102/68   Pulse Readings from Last 3 Encounters:  07/08/21 79  05/07/21 87  04/10/21 71   Wt Readings from Last 3 Encounters:  07/08/21 158 lb (71.7 kg)  05/07/21 164 lb 6.4 oz (74.6 kg)  04/10/21 162 lb 14.4 oz (73.9 kg)   BMI Readings from Last 3 Encounters:  07/08/21 24.02 kg/m  05/07/21 25.00 kg/m  04/10/21 24.77 kg/m    Assessment/Interventions: Review of patient past medical history, allergies, medications, health status, including review of consultants reports, laboratory and other test data, was performed as part of comprehensive evaluation and provision of chronic care management services.   SDOH:  (Social Determinants of Health) assessments and interventions performed: Yes SDOH Interventions    Flowsheet Row Most Recent Value  SDOH Interventions   Financial Strain Interventions Other (Comment)  [Farxiga PAP and samples]      SDOH Screenings   Alcohol Screen: Not on file  Depression (PHQ2-9): Low Risk    PHQ-2 Score: 0  Financial Resource Strain: High Risk   Difficulty of Paying Living Expenses: Very hard  Food Insecurity: Not on  file  Housing: Not on file  Physical Activity: Not on file  Social Connections: Not on file  Stress: Not on file  Tobacco Use: Low Risk    Smoking Tobacco Use: Never   Smokeless Tobacco Use: Never   Passive Exposure: Not on file  Transportation Needs: Not on file    Britt  Allergies  Allergen Reactions   Lisinopril Cough   Losartan Cough    Medications Reviewed Today     Reviewed by Lane Hacker, [facility] (Pharmacist) on 07/10/21 at 1138  Med List Status: <None>   Medication Order Taking? Sig Documenting Provider Last Dose Status Informant  allopurinol (ZYLOPRIM) 100 MG tablet [id number] Yes Take 1 tablet (100 mg total) by mouth 2 (two) times daily. Cox, Kirsten, MD Taking Active   atorvastatin (LIPITOR) 10 MG tablet [id number] Yes Take 1 tablet (10 mg total) by mouth daily. Park Liter, MD Taking Active   carvedilol (COREG) 3.125 MG tablet [id number] Yes Take 1 tablet (3.125 mg total) by mouth 2 (two) times daily with a meal. Park Liter, MD Taking Active   famotidine (PEPCID) 40 MG tablet [id number] Yes Take 1 tablet (40 mg total) by mouth at bedtime. Cox, Kirsten, MD Taking Active   FARXIGA 5 MG TABS tablet [id number] Yes TAKE 1 TABLET(5 MG) BY MOUTH DAILY BEFORE BREAKFAST Cox, Kirsten, MD Taking Active   fluticasone [facility]) 50 MCG/ACT nasal spray [id number] Yes Place 2 sprays into both nostrils daily as needed for allergies or rhinitis. Cox, Kirsten, MD Taking Active   furosemide (LASIX) 40 MG tablet [id number] Yes Take 40 mg by mouth 2 (two) times daily. [provider] Taking Active   gentamicin ointment (GARAMYCIN) 0.1 % [id number] No Apply 1 application topically as directed. To the affected areas(legs) Open wounds  Patient not taking: Reported on 07/10/2021   [provider] Not Taking Active   levothyroxine (SYNTHROID) 50 MCG tablet [id number] Yes Take 1 tablet (50 mcg total) by mouth daily before breakfast. Cox, Kirsten, MD Taking  Active   loratadine (CLARITIN) 10 MG tablet [id number] No Take 1 tablet (10 mg total) by mouth daily as needed for allergies or rhinitis.  Patient not taking: Reported on 07/10/2021   Lillard Anes, MD Not Taking Active  montelukast (SINGULAIR) 10 MG tablet [id number] Yes Take 1 tablet (10 mg total) by mouth at bedtime. Cox, Kirsten, MD Taking Active   potassium chloride (KLOR-CON) 10 MEQ tablet [id number] Yes TAKE 1 TABLET(10 MEQ) BY MOUTH DAILY Cox, Kirsten, MD Taking Active   PROAIR HFA 108 [phone] Base) MCG/ACT inhaler [id number]  Inhale 2 puffs into the lungs every 4 (four) hours as needed for shortness of breath or wheezing. [provider]  Active   spironolactone (ALDACTONE) 25 MG tablet [id number]  Take 0.5 tablets (12.5 mg total) by mouth once for 1 dose. Park Liter, MD  Expired 07/08/21 2359   Vitamin D, Ergocalciferol, (DRISDOL) 1.25 MG (50000 UT) CAPS capsule [id number] Yes Take 50,000 Units by mouth every Monday.  [provider] Taking Active Family Member  warfarin (COUMADIN) 2.5 MG tablet [id number] Yes Take 1/2 a tablet to 1 tablet by mouth daily as directed by the coumadin clinic Agustin Cree, Marily Lente, MD Taking Active   Med List Note Gerlene Burdock 05/19/19 1629): Adeola Dennen (Daughter) [phone]             Patient Active Problem List   Diagnosis Date Noted   Open abdominal wall wound, initial encounter 05/07/2021   Fracture of bone of left shoulder 02/04/2021   Open wound    Cardiac cachexia 71/69/6789   Chronic systolic CHF (congestive heart failure) (HCC)    Elevated hemoglobin (HCC)    GERD (gastroesophageal reflux disease)    Hypertension    LBBB (left bundle branch block)    Mild renal insufficiency    NICM (nonischemic cardiomyopathy) ([place])    Pre-diabetes    Tricuspid regurgitation    Late effect of cerebrovascular accident (CVA) 12/12/2019   History of gout 12/06/2019   Hypokalemia 12/06/2019   Peripheral edema  12/06/2019   Hospital discharge follow-up 06/24/2019   History of Coumadin therapy 06/16/2019   Elevated uric acid in blood 06/06/2019   Acute CVA (cerebrovascular accident) ([place]) 05/30/2019   History of CVA (cerebrovascular accident) 05/30/2019   History of hemiplegia 05/30/2019   Type 2 diabetes mellitus with hyperglycemia, without long-term current use of insulin ([place]) 05/30/2019   Impaired mobility and ADLs 05/30/2019   Type 2 diabetes mellitus, uncontrolled 05/27/2019   Acute respiratory failure ([place])    Stroke (cerebrum) ([place]) - R MCA infarct s/p mechanical thrombectomy 05/19/2019   Middle cerebral artery embolism, 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   Mitral regurgitation 05/04/2019   Cough due to bronchospasm 02/16/2019   Perennial allergic rhinitis 02/16/2019   Nonischemic cardiomyopathy (HCC) cardiac catheterization May 2020 showed normal coronaries, ejection fraction 30% 38/05/1750   Systolic CHF, acute ([place]) 02/58/5277   Acute systolic CHF (congestive heart failure) ([place]) 01/12/2019   Class 2 severe obesity due to excess calories with serious comorbidity and body mass index (BMI) of 38.0 to 38.9 in adult ([place]) 10/19/2018   Gastroesophageal reflux disease 10/19/2018   Hyperlipidemia 10/19/2018    Immunization History  Administered Date(s) Administered   Influenza,inj,Quad PF,6-35 Mos 05/05/2019   Pneumococcal Polysaccharide-23 01/19/2019   Tdap 01/17/2011    Conditions to be addressed/monitored:  Hypertension, Hyperlipidemia, and Diabetes  Care Plan : [place]  Updates made by Lane Hacker, [place] since 07/10/2021 12:00 AM     Problem: Gout, Lipids, Cardio   Priority: High  Onset Date: 07/10/2021     Goal: Disease State Management   Start Date: 07/10/2022  Expected End Date: 07/10/2022  This Visit's Progress: On track  Priority: High  Note:   Current Barriers:  Does not contact provider office for questions/concerns  Pharmacist Clinical Goal(s):  Patient  will achieve adherence to monitoring guidelines and medication adherence to achieve therapeutic efficacy through collaboration with PharmD and provider.   Interventions: 1:1 collaboration with Rochel Brome, MD regarding development and update of comprehensive plan of care as evidenced by provider attestation and co-signature Inter-disciplinary care team collaboration (see longitudinal plan of care) Comprehensive medication review performed; medication list updated in electronic medical record  Thyroid (Goal TSH: 0.4-5.0) Lab Results  Component Value Date   TSH 2.960 08/08/2020  -Controlled -Current treatment: Levothyroxine 52mg -Counseled to take medication on an empty stomach -Recommended to continue current medication   Gout (Goal: Prevent gout flares) No results found for: LABURIC -Controlled -Last Gout Flare: Can't remember -Current treatment  Allopurinol 1083mBID -Medications previously tried: N/A  -We discussed:  Counseled patient on low purine diet plan. Counseled patient to reduce consumption of high-fructose corn syrup, sweetened soft drinks, fruit juices, meat, and seafood. November 2022: Will ask for Uric Acid and to take Allopurinol 2QD instead    Hyperlipidemia: (LDL goal < 70) -Managed by Dr. RoJenne Campushe ASCVD Risk score (Arnett DK, et al., 2019) failed to calculate for the following reasons:   The patient has a prior MI or stroke diagnosis Lab Results  Component Value Date   CHOL 76 (L) 04/10/2021   CHOL 76 (L) 12/13/2020   CHOL 68 (L) 09/12/2020   Lab Results  Component Value Date   HDL 22 (L) 04/10/2021   HDL 18 (L) 12/13/2020   HDL 17 (L) 09/12/2020   Lab Results  Component Value Date   LDLCALC 42 04/10/2021   LDLCALC 44 12/13/2020   LDLCALC 38 09/12/2020   Lab Results  Component Value Date   TRIG 45 04/10/2021   TRIG 57 12/13/2020   TRIG 52 09/12/2020   Lab Results  Component Value Date   CHOLHDL 3.5 04/10/2021   CHOLHDL 4.2  12/13/2020   CHOLHDL 4.0 09/12/2020  No results found for: LDLDIRECT -Controlled -Current treatment: Atorvastatin 1010mMedications previously tried: N/A  -Current dietary patterns: "Tries to eat healthy" -Current exercise habits: Gardening -Educated on Cholesterol goals;  -Recommended to continue current medication  Diabetes (A1c goal <7%) Lab Results  Component Value Date   HGBA1C 6.8 (H) 04/10/2021   HGBA1C 6.9 (H) 12/13/2020   HGBA1C 6.9 (H) 08/08/2020   Lab Results  Component Value Date   MICROALBUR 150 12/13/2020   LDLCALC 42 04/10/2021   CREATININE 0.84 07/08/2021   Lab Results  Component Value Date   NA 136 07/08/2021   K 3.3 (L) 07/08/2021   CREATININE 0.84 07/08/2021   EGFR 77 07/08/2021   GFRNONAA 65 10/11/2020   GLUCOSE 101 (H) 07/08/2021   Lab Results  Component Value Date   WBC 4.4 04/10/2021   HGB 13.5 04/10/2021   HCT 41.1 04/10/2021   MCV 82 04/10/2021   PLT 264 04/10/2021  -Controlled -Current medications: Farxiga 5mg87medications previously tried: N/A  -Current home glucose readings fasting glucose: Doesn't test post prandial glucose: Doesn't test -Denies hypoglycemic/hyperglycemic symptoms -Current exercise: Gardening -Educated on Complications of diabetes including kidney damage, retinal damage, and cardiovascular disease; -Counseled to check feet daily and get yearly eye exams November 2022: Can't afford FarxIranll start PAP and coordinated with [facility] Ironnursing staff who said we have samples available. Tried calling patient to let them know but there was no answer  Atrial Fibrillation (  Goal: prevent stroke and major bleeding) -Controlled -CHADSVASC: CHA2DS2-VASc Score =    The patient's score is based upon:   -Current treatment: Rate control:  Carvedilol 3.125 Kcl Spironolactone 5m take 1/2 QD Furosemide Anticoagulation:  Warfarin PRN -Medications previously tried: N/A -Home BP and HR readings: Doesn't test   -Counseled on importance of adherence to anticoagulant exactly as prescribed; -Recommended to continue current medication  Patient Goals/Self-Care Activities Patient will:  - collaborate with provider on medication access solutions  Follow Up Plan: The patient has been provided with contact information for the care management team and has been advised to call with any health related questions or concerns.   CPP F/U June 2023  NArizona Constable PSherian ReinD. - [phone]        Medication Assistance: Application for Farxiga PAP  medication assistance program. in process.  Anticipated assistance start date December 2023.  See plan of care for additional detail.  Compliance/Adherence/Medication fill history: Star Rating Drugs:  Medication:                Last Fill:         Day Supply Farxiga                        04/11/21            90ds Atorvastatin                 05/24/21            90ds         Care Gaps: Last annual wellness visit? Scheduled 117/91/50If applicable: Last eye exam / retinopathy screening? Never done  Last diabetic foot exam? Never done   [facility] [address] [place]Phone: [phone]: [phone] Uses pill box? No -   Pt endorses 100% compliance  We discussed: Benefits of medication synchronization, packaging and delivery as well as enhanced pharmacist oversight with [facility]. Patient decided to: Utilize [facility] pharmacy for medication synchronization, packaging and delivery Verbal consent obtained for [facility] enhanced pharmacy services (medication synchronization, adherence packaging, delivery coordination). A medication sync plan was created to allow patient to get all medications delivered once every 30 to 90 days per patient preference. Patient understands they have freedom to choose  pharmacy and clinical pharmacist will coordinate care between all prescribers and [facility].  Medication Name                        (please note if Rx is PRN) Prescriber                                                                  (list Provider Name & Phone Number)                                  Timing    Refill Timing Last Fill Date & DS       (if last fill/DS unavailable, list pt.'s quantity on hand) Anticipated next due  date    BB B L EM BT   Days Supply   Furosemide 41m Breakfast lunch [facility]-[phone] Vials    PRN 04/23/2021 30.00   Vit D Disdol 1.298mMonday 50,000 units Cox - [phone]     05/24/2021 90.00 08/22/21  Famotidine 4055mS Cox - [phone]  1  05/25/2021 90.00 08/23/21  Atorvastatin 27m61masowski - [phone] 1  05/24/2021 90.00 08/22/21  KCL 10 mEq ER KrasAgustin [phone]    07/06/2021 90.00 10/04/21  Spironolactone 25mg49me 1/2 AM KrasoAgustin [phone]     05/03/2021 90.00 08/01/21  Carvedilol 3.125 KrasoAgustin [phone] 1  05/24/2021 90.00 08/22/21  Allopurinol 100mg 2m- [phone]  05/24/2021 90.00 08/22/21  Monteleukast 27mg C52m [phone] 05/24/2021 90.00 08/22/21  Warfarin PRN KrasowsAgustin [phone]    06/17/2021 90.00 09/15/21  Levothyroxine 50mcg C85m [phone]/04/2021 90.00 08/01/21  Fluticasone nasal spray Cox - [phone] 07/08/2021 Hold   Albuterol (If expensive, don't fill) Cox - [phone] Hold Hold      Care Plan and Follow Up Patient Decision:  Patient agrees to Care Plan and Follow-up.  Plan: The patient has been provided with contact information for the care management team and has been advised to call with any health related questions or concerns.   June 2023: CPP  Amrit Cress KArizona ConstableD.[place]6-36- [phone]

## 2021-07-10 NOTE — Telephone Encounter (Signed)
-----   Message from Georgeanna Lea, MD sent at 07/10/2021 10:08 AM EST ----- Actually her numbers looking better.  B better protein a little bit better albumin, potassium is still low, please increase dose of potassium from 10 mEq daily to 20 mg daily, Chem-7 need to be repeated in 1 week

## 2021-07-10 NOTE — Patient Instructions (Signed)
Visit Information   Goals Addressed             This Visit's Progress    Manage My Medicine       Timeframe:  Long-Range Goal Priority:  High Start Date:                             Expected End Date:                       Follow Up Date 07/10/22   - call for medicine refill 2 or 3 days before it runs out    Why is this important?   These steps will help you keep on track with your medicines.   Notes:      Set My Target A1C-Diabetes Type 2       Timeframe:  Long-Range Goal Priority:  High Start Date:                             Expected End Date:                       Follow Up Date 07/10/22   - set target A1C    Why is this important?   Your target A1C is decided together by you and your doctor.  It is based on several things like your age and other health issues.    Notes:        Patient Care Plan: CCM Pharmacy Care Plan     Problem Identified: Gout, Lipids, Cardio   Priority: High  Onset Date: 07/10/2021     Goal: Disease State Management   Start Date: 07/10/2022  Expected End Date: 07/10/2022  This Visit's Progress: On track  Priority: High  Note:   Current Barriers:  Does not contact provider office for questions/concerns  Pharmacist Clinical Goal(s):  Patient will achieve adherence to monitoring guidelines and medication adherence to achieve therapeutic efficacy through collaboration with PharmD and provider.   Interventions: 1:1 collaboration with Rochel Brome, MD regarding development and update of comprehensive plan of care as evidenced by provider attestation and co-signature Inter-disciplinary care team collaboration (see longitudinal plan of care) Comprehensive medication review performed; medication list updated in electronic medical record  Thyroid (Goal TSH: 0.4-5.0) Lab Results  Component Value Date   TSH 2.960 08/08/2020  -Controlled -Current treatment: Levothyroxine 60mg -Counseled to take medication on an empty  stomach -Recommended to continue current medication   Gout (Goal: Prevent gout flares) No results found for: LABURIC -Controlled -Last Gout Flare: Can't remember -Current treatment  Allopurinol 1090mBID -Medications previously tried: N/A  -We discussed:  Counseled patient on low purine diet plan. Counseled patient to reduce consumption of high-fructose corn syrup, sweetened soft drinks, fruit juices, meat, and seafood. November 2022: Will ask for Uric Acid and to take Allopurinol 2QD instead    Hyperlipidemia: (LDL goal < 70) -Managed by Dr. RoJenne Campushe ASCVD Risk score (Arnett DK, et al., 2019) failed to calculate for the following reasons:   The patient has a prior MI or stroke diagnosis Lab Results  Component Value Date   CHOL 76 (L) 04/10/2021   CHOL 76 (L) 12/13/2020   CHOL 68 (L) 09/12/2020   Lab Results  Component Value Date   HDL 22 (L) 04/10/2021   HDL 18 (L) 12/13/2020   HDL 17 (L)  09/12/2020   Lab Results  Component Value Date   LDLCALC 42 04/10/2021   LDLCALC 44 12/13/2020   LDLCALC 38 09/12/2020   Lab Results  Component Value Date   TRIG 45 04/10/2021   TRIG 57 12/13/2020   TRIG 52 09/12/2020   Lab Results  Component Value Date   CHOLHDL 3.5 04/10/2021   CHOLHDL 4.2 12/13/2020   CHOLHDL 4.0 09/12/2020  No results found for: LDLDIRECT -Controlled -Current treatment: Atorvastatin 31m -Medications previously tried: N/A  -Current dietary patterns: "Tries to eat healthy" -Current exercise habits: Gardening -Educated on Cholesterol goals;  -Recommended to continue current medication  Diabetes (A1c goal <7%) Lab Results  Component Value Date   HGBA1C 6.8 (H) 04/10/2021   HGBA1C 6.9 (H) 12/13/2020   HGBA1C 6.9 (H) 08/08/2020   Lab Results  Component Value Date   MICROALBUR 150 12/13/2020   LDLCALC 42 04/10/2021   CREATININE 0.84 07/08/2021   Lab Results  Component Value Date   NA 136 07/08/2021   K 3.3 (L) 07/08/2021    CREATININE 0.84 07/08/2021   EGFR 77 07/08/2021   GFRNONAA 65 10/11/2020   GLUCOSE 101 (H) 07/08/2021   Lab Results  Component Value Date   WBC 4.4 04/10/2021   HGB 13.5 04/10/2021   HCT 41.1 04/10/2021   MCV 82 04/10/2021   PLT 264 04/10/2021  -Controlled -Current medications: Farxiga 569m-Medications previously tried: N/A  -Current home glucose readings fasting glucose: Doesn't test post prandial glucose: Doesn't test -Denies hypoglycemic/hyperglycemic symptoms -Current exercise: Gardening -Educated on Complications of diabetes including kidney damage, retinal damage, and cardiovascular disease; -Counseled to check feet daily and get yearly eye exams November 2022: Can't afford FaIranWill start PAP and coordinated with [facility] nursing staff who said we have samples available. Tried calling patient to let them know but there was no answer  Atrial Fibrillation (Goal: prevent stroke and major bleeding) -Controlled -CHADSVASC: CHA2DS2-VASc Score =    The patient's score is based upon:   -Current treatment: Rate control:  Carvedilol 3.125 Kcl Spironolactone 2529make 1/2 QD Furosemide Anticoagulation:  Warfarin PRN -Medications previously tried: N/A -Home BP and HR readings: Doesn't test  -Counseled on importance of adherence to anticoagulant exactly as prescribed; -Recommended to continue current medication  Patient Goals/Self-Care Activities Patient will:  - collaborate with provider on medication access solutions  Follow Up Plan: The patient has been provided with contact information for the care management team and has been advised to call with any health related questions or concerns.   CPP F/U June 2023  Breanna Bennett - [phone]- [phone]    Breanna Bennett given information about Chronic Care Management services today including:  CCM service includes personalized support from designated clinical staff supervised by her physician, including  individualized plan of care and coordination with other care providers 24/7 contact phone numbers for assistance for urgent and routine care needs. Standard insurance, coinsurance, copays and deductibles apply for chronic care management only during months in which we provide at least 20 minutes of these services. Most insurances cover these services at 100%, however patients may be responsible for any copay, coinsurance and/or deductible if applicable. This service may help you avoid the need for more expensive face-to-face services. Only one practitioner may furnish and bill the service in a calendar month. The patient may stop CCM services at any time (effective at the end of the month) by phone call to the office staff.  Patient agreed  to services and verbal consent obtained.   The patient verbalized understanding of instructions, educational materials, and care plan provided today and declined offer to receive copy of patient instructions, educational materials, and care plan.  The pharmacy team will reach out to the patient again over the next 90 days.   Lane Hacker, [facility]

## 2021-07-11 ENCOUNTER — Telehealth: Payer: Self-pay

## 2021-07-11 NOTE — Telephone Encounter (Signed)
Daughter calling for pt as pt had "lab work at heart care and showed urinary infection." Requesting medication. UA on 04/07/21.   Terrill Mohr 07/11/21 8:39 AM

## 2021-07-11 NOTE — Telephone Encounter (Signed)
Left VM for Yolanda to give clinic call back.    Lorita Officer, CCMA 07/11/21 1:14 PM

## 2021-07-15 NOTE — Telephone Encounter (Signed)
Daughter returned call. She is aware and pt has appointment tomorrow. Confirmed appointment time/date.   Terrill Mohr 07/15/21 8:31 AM

## 2021-07-16 ENCOUNTER — Encounter: Payer: Self-pay | Admitting: Family Medicine

## 2021-07-16 ENCOUNTER — Other Ambulatory Visit: Payer: Self-pay

## 2021-07-16 ENCOUNTER — Ambulatory Visit (INDEPENDENT_AMBULATORY_CARE_PROVIDER_SITE_OTHER): Payer: Medicare Other | Admitting: Family Medicine

## 2021-07-16 ENCOUNTER — Ambulatory Visit: Payer: Medicare Other | Admitting: Family Medicine

## 2021-07-16 VITALS — BP 104/76 | HR 85 | Resp 18 | Ht 68.0 in | Wt 154.0 lb

## 2021-07-16 DIAGNOSIS — E1121 Type 2 diabetes mellitus with diabetic nephropathy: Secondary | ICD-10-CM

## 2021-07-16 DIAGNOSIS — K219 Gastro-esophageal reflux disease without esophagitis: Secondary | ICD-10-CM | POA: Diagnosis not present

## 2021-07-16 DIAGNOSIS — I10 Essential (primary) hypertension: Secondary | ICD-10-CM

## 2021-07-16 DIAGNOSIS — I110 Hypertensive heart disease with heart failure: Secondary | ICD-10-CM | POA: Diagnosis not present

## 2021-07-16 DIAGNOSIS — Z23 Encounter for immunization: Secondary | ICD-10-CM

## 2021-07-16 DIAGNOSIS — E1169 Type 2 diabetes mellitus with other specified complication: Secondary | ICD-10-CM

## 2021-07-16 DIAGNOSIS — L98492 Non-pressure chronic ulcer of skin of other sites with fat layer exposed: Secondary | ICD-10-CM

## 2021-07-16 DIAGNOSIS — I5022 Chronic systolic (congestive) heart failure: Secondary | ICD-10-CM

## 2021-07-16 DIAGNOSIS — R300 Dysuria: Secondary | ICD-10-CM

## 2021-07-16 DIAGNOSIS — E782 Mixed hyperlipidemia: Secondary | ICD-10-CM

## 2021-07-16 MED ORDER — NITROFURANTOIN MONOHYD MACRO 100 MG PO CAPS: 100.0000 mg | ORAL_CAPSULE | Freq: Two times a day (BID) | ORAL | 0 refills | Status: DC

## 2021-07-16 NOTE — Patient Instructions (Signed)
Bladder infection: Given Macrobid 100 mg twice daily for 1 week. If continued to having burning call back on Monday and can give [place] a urine sample please.  Call back with name of wound care doctor who gave a second opinion in [facility].  May have been a Development worker, international aid.  Consider COVID vaccinations.
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
## 2021-07-17 ENCOUNTER — Other Ambulatory Visit: Payer: Self-pay

## 2021-07-17 ENCOUNTER — Telehealth: Payer: Self-pay

## 2021-07-17 LAB — CBC WITH DIFFERENTIAL/PLATELET
Basophils Absolute: 0.1 10*3/uL (ref 0.0–0.2)
Basos: 1 %
EOS (ABSOLUTE): 0.4 10*3/uL (ref 0.0–0.4)
Eos: 6 %
Hematocrit: 42.4 % (ref 34.0–46.6)
Hemoglobin: 13.8 g/dL (ref 11.1–15.9)
Immature Grans (Abs): 0 10*3/uL (ref 0.0–0.1)
Immature Granulocytes: 0 %
Lymphocytes Absolute: 1.4 10*3/uL (ref 0.7–3.1)
Lymphs: 25 %
MCH: 25.9 pg — ABNORMAL LOW (ref 26.6–33.0)
MCHC: 32.5 g/dL (ref 31.5–35.7)
MCV: 80 fL (ref 79–97)
Monocytes Absolute: 0.6 10*3/uL (ref 0.1–0.9)
Monocytes: 10 %
Neutrophils Absolute: 3.2 10*3/uL (ref 1.4–7.0)
Neutrophils: 58 %
Platelets: 234 10*3/uL (ref 150–450)
RBC: 5.33 x10E6/uL — ABNORMAL HIGH (ref 3.77–5.28)
RDW: 17.5 % — ABNORMAL HIGH (ref 11.7–15.4)
WBC: 5.6 10*3/uL (ref 3.4–10.8)

## 2021-07-17 LAB — COMPREHENSIVE METABOLIC PANEL
ALT: 5 IU/L (ref 0–32)
AST: 14 IU/L (ref 0–40)
Albumin/Globulin Ratio: 0.7 — ABNORMAL LOW (ref 1.2–2.2)
Albumin: 3.6 g/dL — ABNORMAL LOW (ref 3.8–4.8)
Alkaline Phosphatase: 126 IU/L — ABNORMAL HIGH (ref 44–121)
BUN/Creatinine Ratio: 37 — ABNORMAL HIGH (ref 12–28)
BUN: 31 mg/dL — ABNORMAL HIGH (ref 8–27)
Bilirubin Total: 1.3 mg/dL — ABNORMAL HIGH (ref 0.0–1.2)
CO2: 22 mmol/L (ref 20–29)
Calcium: 9.4 mg/dL (ref 8.7–10.3)
Chloride: 101 mmol/L (ref 96–106)
Creatinine, Ser: 0.84 mg/dL (ref 0.57–1.00)
Globulin, Total: 5.2 g/dL — ABNORMAL HIGH (ref 1.5–4.5)
Glucose: 121 mg/dL — ABNORMAL HIGH (ref 70–99)
Potassium: 3.4 mmol/L — ABNORMAL LOW (ref 3.5–5.2)
Sodium: 137 mmol/L (ref 134–144)
Total Protein: 8.8 g/dL — ABNORMAL HIGH (ref 6.0–8.5)
eGFR: 77 mL/min/{1.73_m2} (ref >59)

## 2021-07-17 LAB — HEMOGLOBIN A1C
Est. average glucose Bld gHb Est-mCnc: 151 mg/dL
Hgb A1c MFr Bld: 6.9 % — ABNORMAL HIGH (ref 4.8–5.6)

## 2021-07-17 LAB — VITAMIN D 25 HYDROXY (VIT D DEFICIENCY, FRACTURES): Vit D, 25-Hydroxy: 87.4 ng/mL (ref 30.0–100.0)

## 2021-07-17 MED ORDER — POTASSIUM CHLORIDE ER 20 MEQ PO TBCR: EXTENDED_RELEASE_TABLET | ORAL | 0 refills | Status: DC

## 2021-07-17 NOTE — Progress Notes (Signed)
Blood count normal.  Liver function normal.  Kidney function normal.  Potassium little low. Recommend increase potassium to 20 meq 2 daily. Send new rx.  Protein levels low. Recommend drink glucerna one twice a day.  HBA1C: good Vitamin D is excellent.

## 2021-07-17 NOTE — Chronic Care Management (AMB) (Signed)
    Chronic Care Management Pharmacy Assistant   Name: Breanna Bennett  MRN: [id number] DOB: Nov 18, 1955   Reason for Encounter: Patient Assistance Documentation   07/17/21 PAP for Breanna Bennett has been filled out through [place] and uploaded to log.  I have informed pt of next steps and to fill out and take that up to the provider who sent the medication in and have them fill their portion out and fax for pt. Pt understood      Medications: Outpatient Encounter Medications as of 07/17/2021  Medication Sig   allopurinol (ZYLOPRIM) 100 MG tablet Take 1 tablet (100 mg total) by mouth 2 (two) times daily.   atorvastatin (LIPITOR) 10 MG tablet Take 1 tablet (10 mg total) by mouth daily.   carvedilol (COREG) 3.125 MG tablet Take 1 tablet (3.125 mg total) by mouth 2 (two) times daily with a meal.   dapagliflozin propanediol (FARXIGA) 5 MG TABS tablet TAKE 1 TABLET(5 MG) BY MOUTH DAILY BEFORE BREAKFAST   famotidine (PEPCID) 40 MG tablet Take 1 tablet (40 mg total) by mouth at bedtime.   fluticasone (FLONASE) 50 MCG/ACT nasal spray Place 2 sprays into both nostrils daily as needed for allergies or rhinitis.   furosemide (LASIX) 40 MG tablet Take 40 mg by mouth 2 (two) times daily.   levothyroxine (SYNTHROID) 50 MCG tablet Take 1 tablet (50 mcg total) by mouth daily before breakfast.   montelukast (SINGULAIR) 10 MG tablet Take 1 tablet (10 mg total) by mouth at bedtime.   nitrofurantoin, macrocrystal-monohydrate, (MACROBID) 100 MG capsule Take 1 capsule (100 mg total) by mouth 2 (two) times daily.   potassium chloride 20 MEQ TBCR TAKE 1 TABLET(20MEQ) BY MOUTH DAILY   PROAIR HFA 108 (90 Base) MCG/ACT inhaler Inhale 2 puffs into the lungs every 4 (four) hours as needed for shortness of breath or wheezing.   spironolactone (ALDACTONE) 25 MG tablet Take 0.5 tablets (12.5 mg total) by mouth once for 1 dose.   Vitamin D, Ergocalciferol, (DRISDOL) 1.25 MG (50000 UT) CAPS capsule Take 50,000 Units by mouth every  Monday.    warfarin (COUMADIN) 2.5 MG tablet Take 1/2 a tablet to 1 tablet by mouth daily as directed by the coumadin clinic   No facility-administered encounter medications on file as of 07/17/2021.   Roxana Hires, CMA Clinical Pharmacist Assistant  [phone]

## 2021-07-19 ENCOUNTER — Other Ambulatory Visit: Payer: Self-pay | Admitting: Family Medicine

## 2021-07-19 MED ORDER — FLUTICASONE PROPIONATE 50 MCG/ACT NA SUSP: 2.0000 | Freq: Every day | NASAL | 3 refills | Status: DC | PRN

## 2021-07-19 MED ORDER — DAPAGLIFLOZIN PROPANEDIOL 5 MG PO TABS: ORAL_TABLET | ORAL | 1 refills | Status: DC

## 2021-07-19 MED ORDER — ALLOPURINOL 100 MG PO TABS: 200.0000 mg | ORAL_TABLET | Freq: Every day | ORAL | 3 refills | Status: DC

## 2021-07-19 MED ORDER — FAMOTIDINE 40 MG PO TABS
40.0000 mg | ORAL_TABLET | Freq: Every day | ORAL | 3 refills | Status: DC
Start: 2021-07-19 — End: 2022-04-03

## 2021-07-19 MED ORDER — ATORVASTATIN CALCIUM 10 MG PO TABS: 10.0000 mg | ORAL_TABLET | Freq: Every day | ORAL | 1 refills | Status: DC

## 2021-07-19 MED ORDER — VITAMIN D (ERGOCALCIFEROL) 1.25 MG (50000 UNIT) PO CAPS: 50000.0000 [IU] | ORAL_CAPSULE | ORAL | 3 refills | Status: DC

## 2021-07-19 MED ORDER — MONTELUKAST SODIUM 10 MG PO TABS
10.0000 mg | ORAL_TABLET | Freq: Every day | ORAL | 3 refills | Status: DC
Start: 2021-07-19 — End: 2021-12-16

## 2021-07-19 MED ORDER — POTASSIUM CHLORIDE ER 20 MEQ PO TBCR: EXTENDED_RELEASE_TABLET | ORAL | 0 refills | Status: DC

## 2021-07-19 MED ORDER — FUROSEMIDE 40 MG PO TABS: 40.0000 mg | ORAL_TABLET | Freq: Two times a day (BID) | ORAL | 1 refills | Status: DC

## 2021-07-19 MED ORDER — CARVEDILOL 3.125 MG PO TABS: 3.1250 mg | ORAL_TABLET | Freq: Two times a day (BID) | ORAL | 1 refills | Status: DC

## 2021-07-19 MED ORDER — SPIRONOLACTONE 25 MG PO TABS: 12.5000 mg | ORAL_TABLET | Freq: Every day | ORAL | 1 refills | Status: DC

## 2021-07-19 MED ORDER — PROAIR HFA 108 (90 BASE) MCG/ACT IN AERS: 2.0000 | INHALATION_SPRAY | RESPIRATORY_TRACT | 1 refills | Status: DC | PRN

## 2021-07-19 MED ORDER — LEVOTHYROXINE SODIUM 50 MCG PO TABS: 50.0000 ug | ORAL_TABLET | Freq: Every day | ORAL | 1 refills | Status: DC

## 2021-07-23 ENCOUNTER — Ambulatory Visit (INDEPENDENT_AMBULATORY_CARE_PROVIDER_SITE_OTHER): Payer: Medicare Other

## 2021-07-23 ENCOUNTER — Other Ambulatory Visit: Payer: Self-pay

## 2021-07-23 DIAGNOSIS — Z5181 Encounter for therapeutic drug level monitoring: Secondary | ICD-10-CM

## 2021-07-23 DIAGNOSIS — Z9229 Personal history of other drug therapy: Secondary | ICD-10-CM | POA: Diagnosis not present

## 2021-07-23 LAB — POCT INR: INR: 1.6 — AB (ref 2.0–3.0)

## 2021-07-23 NOTE — Patient Instructions (Signed)
Description   Take 1.5 tablets today and then START taking 1 tablet daily. Be consistent with greens Recheck in 2 weeks

## 2021-07-24 DIAGNOSIS — E1165 Type 2 diabetes mellitus with hyperglycemia: Secondary | ICD-10-CM | POA: Diagnosis not present

## 2021-07-24 DIAGNOSIS — I5022 Chronic systolic (congestive) heart failure: Secondary | ICD-10-CM | POA: Diagnosis not present

## 2021-07-24 DIAGNOSIS — E782 Mixed hyperlipidemia: Secondary | ICD-10-CM | POA: Diagnosis not present

## 2021-07-24 DIAGNOSIS — I5021 Acute systolic (congestive) heart failure: Secondary | ICD-10-CM

## 2021-07-29 ENCOUNTER — Telehealth: Payer: Self-pay

## 2021-07-29 NOTE — Telephone Encounter (Signed)
Patient daughter calling for patient. Pt is out of farxiga but waiting on approval of PAP. Requesting samples. Daughter will stop by for samples.   Breanna Bennett 07/29/21 8:33 AM

## 2021-07-31 ENCOUNTER — Telehealth: Payer: Self-pay

## 2021-07-31 ENCOUNTER — Other Ambulatory Visit: Payer: Self-pay | Admitting: Family Medicine

## 2021-07-31 MED ORDER — GENTEAL SEVERE 0.3 % OP GEL: Freq: Four times a day (QID) | OPHTHALMIC | 2 refills | Status: DC | PRN

## 2021-07-31 NOTE — Telephone Encounter (Signed)
Sent  kc

## 2021-07-31 NOTE — Telephone Encounter (Signed)
Daughter Patsy Lager calling requesting medication for pt. Pt previously saw Dr Arletha Grippe, neurology, but no longer follows. Arletha Grippe prescribed ophthalmic solution for pt in January of 2021. Pt/daughter is requesting a refill of this solution due to eye irritation.   Medication: white petrolatum-mineral oiL (GENTEAL) 94-3 % Oint ophthalmic ointment Place 1 application into both eyes as needed. 3.5 g 1 Pharmacy: [facility] on [address].  Lorita Officer, [place] 07/31/21 2:11 PM

## 2021-08-04 ENCOUNTER — Other Ambulatory Visit: Payer: Self-pay | Admitting: Cardiology

## 2021-08-04 ENCOUNTER — Encounter: Payer: Self-pay | Admitting: Family Medicine

## 2021-08-04 DIAGNOSIS — R300 Dysuria: Secondary | ICD-10-CM | POA: Insufficient documentation

## 2021-08-04 DIAGNOSIS — L98499 Non-pressure chronic ulcer of skin of other sites with unspecified severity: Secondary | ICD-10-CM | POA: Insufficient documentation

## 2021-08-04 NOTE — Assessment & Plan Note (Signed)
Stable.  Continue furosemide 40 mg once twice daily.  Continue potassium chloride 20 meq one daily.  Continue carvedilol 3.125 mg one twice a day.  Continue farxiga 5 mg once daily

## 2021-08-04 NOTE — Assessment & Plan Note (Signed)
Unable to get a UA.  Rx: macrobid.  If not improved by Monday, call the office.

## 2021-08-04 NOTE — Assessment & Plan Note (Signed)
Control: good Recommend check sugars fasting daily. Recommend check feet daily. Recommend annual eye exams. Medicines: continue farxiga 5 mg daily.  Unable to give [place] a urine to check for microalbumin. Continue to work on eating a healthy diet and exercise.  Labs drawn today.

## 2021-08-04 NOTE — Assessment & Plan Note (Addendum)
Stable.  Continue furosemide 40 mg once twice daily.  Continue potassium chloride 20 meq one daily.  Continue carvedilol 3.125 mg one twice a day.  Continue spironolactone 25 mg 1/2 daily. Continue farxiga 5 mg daily

## 2021-08-04 NOTE — Assessment & Plan Note (Signed)
Numerous on legs. Continue to see wound care. Continue dressings as recommended by wound care at home.

## 2021-08-04 NOTE — Assessment & Plan Note (Signed)
Well controlled.  No changes to medicines. Continue lipitor 10 mg once daily.  Continue to work on eating a healthy diet and exercise.  Labs drawn today.

## 2021-08-06 ENCOUNTER — Ambulatory Visit (INDEPENDENT_AMBULATORY_CARE_PROVIDER_SITE_OTHER): Payer: Medicare Other

## 2021-08-06 ENCOUNTER — Other Ambulatory Visit: Payer: Self-pay

## 2021-08-06 DIAGNOSIS — Z9229 Personal history of other drug therapy: Secondary | ICD-10-CM

## 2021-08-06 DIAGNOSIS — Z7901 Long term (current) use of anticoagulants: Secondary | ICD-10-CM | POA: Diagnosis not present

## 2021-08-06 LAB — POCT INR: INR: 1.8 — AB (ref 2.0–3.0)

## 2021-08-06 NOTE — Patient Instructions (Signed)
Take 2 tablets today and then START taking 1 tablet daily, except Wednesday take 1.5 tablets. Be consistent with greens Recheck in 2 weeks

## 2021-08-12 ENCOUNTER — Ambulatory Visit: Payer: Medicare Other

## 2021-08-12 ENCOUNTER — Ambulatory Visit: Payer: Medicare Other | Admitting: Nurse Practitioner

## 2021-08-20 ENCOUNTER — Other Ambulatory Visit: Payer: Self-pay

## 2021-08-20 ENCOUNTER — Ambulatory Visit: Payer: Medicare Other

## 2021-08-20 DIAGNOSIS — Z5181 Encounter for therapeutic drug level monitoring: Secondary | ICD-10-CM | POA: Diagnosis not present

## 2021-08-20 DIAGNOSIS — Z7901 Long term (current) use of anticoagulants: Secondary | ICD-10-CM

## 2021-08-20 DIAGNOSIS — Z9229 Personal history of other drug therapy: Secondary | ICD-10-CM

## 2021-08-20 LAB — POCT INR: INR: 2 (ref 2.0–3.0)

## 2021-08-20 NOTE — Patient Instructions (Addendum)
Description   Take 1.5 tablets today and then continue taking 1 tablet daily, except Wednesday take 1.5 tablets. Be consistent with greens (2 servings per week)  Recheck in 4 weeks

## 2021-08-26 ENCOUNTER — Other Ambulatory Visit: Payer: Self-pay | Admitting: Family Medicine

## 2021-08-28 ENCOUNTER — Encounter: Payer: Medicare Other | Admitting: Nurse Practitioner

## 2021-09-04 ENCOUNTER — Encounter: Payer: Medicare Other | Admitting: Nurse Practitioner

## 2021-09-15 ENCOUNTER — Other Ambulatory Visit: Payer: Self-pay | Admitting: Cardiology

## 2021-09-15 DIAGNOSIS — Z9229 Personal history of other drug therapy: Secondary | ICD-10-CM

## 2021-09-17 ENCOUNTER — Ambulatory Visit (INDEPENDENT_AMBULATORY_CARE_PROVIDER_SITE_OTHER): Payer: Medicare Other

## 2021-09-17 ENCOUNTER — Other Ambulatory Visit: Payer: Self-pay

## 2021-09-17 DIAGNOSIS — Z9229 Personal history of other drug therapy: Secondary | ICD-10-CM | POA: Diagnosis not present

## 2021-09-17 DIAGNOSIS — Z5181 Encounter for therapeutic drug level monitoring: Secondary | ICD-10-CM | POA: Diagnosis not present

## 2021-09-17 LAB — POCT INR: INR: 3.3 — AB (ref 2.0–3.0)

## 2021-09-17 NOTE — Patient Instructions (Signed)
Description   Only take 0.5 tablet today and then continue taking 1 tablet daily, except Wednesday take 1.5 tablets.  Be consistent with greens (2 servings per week)  Recheck in 4 weeks [facility]madin Clinic [phone]

## 2021-09-20 ENCOUNTER — Telehealth: Payer: Self-pay

## 2021-09-20 NOTE — Chronic Care Management (AMB) (Signed)
Chronic Care Management Pharmacy Assistant   Name: Breanna Bennett  MRN: [id number] DOB: Nov 14, 1955  Reason for Encounter: Medication Coordination for [facility]    Recent office visits:  07/31/21 Rochel Brome MD. Orders Only. Ordered Genteal Severe 0.3% Gel eye ointment.   07/16/21 Rochel Brome MD. Seen for CHF. Started on Macrobid 100 mg 2 times daily  Recent consult visits:  09/17/21 (Cardiology) Lisette Abu RN. Seen for Anit-Coag Visit. Only take Warfarin 0.5 tablet today and then continue taking 1 tablet daily, except Wednesday take 1.5 tablets.   08/20/21 (Cardiology) Lisette Abu RN. Seen for Anit-Coag Visit. Only take Warfarin 1.5 tablets today and then continue taking 1 tablet daily, except Wednesday take 1.5 tablets  08/06/21 (Cardiology) Frederik Schmidt RN. Seen for Anit-Coag Visit. Only take Warfarin  2 tablets today and then START taking 1 tablet daily, except Wednesday take 1.5 tablets  07/23/21 (Cardiology) Lisette Abu RN. Seen for Anit-Coag Visit. Only take Warfarin 1.5 tablets today and then START taking 1 tablet daily  07/10/21 (Cardiology)  Sharin Grave RN. Telephone Encounter. She will increase potassium to 20 meq daily   Hospital visits:  None  Medications: Outpatient Encounter Medications as of 09/20/2021  Medication Sig   allopurinol (ZYLOPRIM) 100 MG tablet TAKE 1 TABLET(100 MG) BY MOUTH TWICE DAILY   atorvastatin (LIPITOR) 10 MG tablet Take 1 tablet (10 mg total) by mouth daily.   carvedilol (COREG) 3.125 MG tablet Take 1 tablet (3.125 mg total) by mouth 2 (two) times daily with a meal.   dapagliflozin propanediol (FARXIGA) 5 MG TABS tablet TAKE 1 TABLET(5 MG) BY MOUTH DAILY BEFORE BREAKFAST   famotidine (PEPCID) 40 MG tablet Take 1 tablet (40 mg total) by mouth at bedtime.   fluticasone (FLONASE) 50 MCG/ACT nasal spray Place 2 sprays into both nostrils daily as needed for allergies or rhinitis.   furosemide (LASIX) 40 MG tablet Take 1 tablet (40  mg total) by mouth 2 (two) times daily.   hypromellose (GENTEAL SEVERE) 0.3 % GEL ophthalmic ointment Place into both eyes every 6 (six) hours as needed for dry eyes.   levothyroxine (SYNTHROID) 50 MCG tablet Take 1 tablet (50 mcg total) by mouth daily before breakfast.   montelukast (SINGULAIR) 10 MG tablet Take 1 tablet (10 mg total) by mouth at bedtime.   nitrofurantoin, macrocrystal-monohydrate, (MACROBID) 100 MG capsule Take 1 capsule (100 mg total) by mouth 2 (two) times daily.   Potassium Chloride ER 20 MEQ TBCR TAKE 1 TABLET(20MEQ) BY Mouth twice daily   PROAIR HFA 108 (90 Base) MCG/ACT inhaler Inhale 2 puffs into the lungs every 4 (four) hours as needed for shortness of breath or wheezing.   spironolactone (ALDACTONE) 25 MG tablet Take 0.5 tablets (12.5 mg total) by mouth daily.   Vitamin D, Ergocalciferol, (DRISDOL) 1.25 MG (50000 UNIT) CAPS capsule Take 1 capsule (50,000 Units total) by mouth every Monday.   warfarin (COUMADIN) 2.5 MG tablet TAKE 1/2 TO 1 TABLET BY MOUTH DAILY AS DIRECTED BY THE COUMADIN CLINIC   No facility-administered encounter medications on file as of 09/20/2021.   Reviewed chart for medication changes ahead of medication coordination call.  No hospital visits since last care coordination call/Pharmacist visit.   BP Readings from Last 3 Encounters:  07/16/21 104/76  07/08/21 90/70  05/07/21 110/76    Lab Results  Component Value Date   HGBA1C 6.9 (H) 07/16/2021     Patient obtains medications through [facility]  30 Days   Patient is due  for her first adherence delivery on: 10/02/21. Called patient and reviewed medications and coordinated delivery.  This delivery to include: Vitamin D2 1258mcg 1 tablet in Breakfast on Mondays Famotidine 40mg  1 tablet at Bedtime Atorvastatin 10mg  1 tablet at Bedtime Potassium Chloride ER 81meq 1 at Breakfast and 1 in Evening Meal Spironolactone 25mg  0.5 at Breakfast Carvedilol 3.125mg  1 at Breakfast and 1 at  Bedtime Allopurinol 100mg  2 at Bedtime Montelukast 10mg  1 at Bedtime Warfarin 2.5mg  Take 0.5 to 1 tablet daily ([place])  Levothyroxine 29mcg 1 at Breakfast Proair inhaler 108-2 puffs into lungs every 4 hours prn  Farxiga 5mg - 1 tab at Before Breakfast   Patient declined the following medications  None  Patient needs refills  Warfarin 2.5mg  Take 0.5 to 1 tablet daily -Called Provider and spoke with the staff and they are sending this to [place].   Confirmed delivery date of 10/02/21, advised patient that pharmacy will contact them the morning of delivery.  Elray Mcgregor, [facility] Pharmacist Assistant  [phone]

## 2021-09-24 ENCOUNTER — Telehealth: Payer: Self-pay | Admitting: Cardiology

## 2021-09-24 DIAGNOSIS — Z9229 Personal history of other drug therapy: Secondary | ICD-10-CM

## 2021-09-24 MED ORDER — WARFARIN SODIUM 2.5 MG PO TABS: ORAL_TABLET | ORAL | 0 refills | Status: DC

## 2021-09-24 NOTE — Telephone Encounter (Signed)
Compliant on meds 

## 2021-09-24 NOTE — Telephone Encounter (Signed)
Prescription refill request received for warfarin Lov: Breanna Bennett, 07/08/2021 Next INR check: 2/21 Warfarin tablet strength: 2.5mg    Refill sent.

## 2021-09-24 NOTE — Telephone Encounter (Signed)
°*  STAT* If patient is at the pharmacy, call can be transferred to refill team.   1. Which medications need to be refilled? (please list name of each medication and dose if known) warfarin (COUMADIN) 2.5 MG tablet  2. Which pharmacy/location (including street and city if local pharmacy) is medication to be sent to?[facility] - [place], [place] - [address]  Phone:  [phone] Fax:  [phone]  3. Do they need a 30 day or 90 day supply? 90 ds

## 2021-09-27 ENCOUNTER — Telehealth: Payer: Self-pay

## 2021-09-27 NOTE — Progress Notes (Signed)
° ° °  Chronic Care Management Pharmacy Assistant   Name: Breanna Bennett  MRN: [id number] DOB: 1955/11/05   Reason for Encounter: Refill process   I just spoke with pt today in regards to most of her medications being denied to be filled on 10/02/21 due to it being too soon to refill and pt felt more comfortable staying with [facility]. She declines to use our pharmacy for now and feels comfortable with staying with them.  Medications: Outpatient Encounter Medications as of 09/27/2021  Medication Sig   allopurinol (ZYLOPRIM) 100 MG tablet TAKE 1 TABLET(100 MG) BY MOUTH TWICE DAILY   atorvastatin (LIPITOR) 10 MG tablet Take 1 tablet (10 mg total) by mouth daily.   carvedilol (COREG) 3.125 MG tablet Take 1 tablet (3.125 mg total) by mouth 2 (two) times daily with a meal.   dapagliflozin propanediol (FARXIGA) 5 MG TABS tablet TAKE 1 TABLET(5 MG) BY MOUTH DAILY BEFORE BREAKFAST   famotidine (PEPCID) 40 MG tablet Take 1 tablet (40 mg total) by mouth at bedtime.   fluticasone (FLONASE) 50 MCG/ACT nasal spray Place 2 sprays into both nostrils daily as needed for allergies or rhinitis.   furosemide (LASIX) 40 MG tablet Take 1 tablet (40 mg total) by mouth 2 (two) times daily.   hypromellose (GENTEAL SEVERE) 0.3 % GEL ophthalmic ointment Place into both eyes every 6 (six) hours as needed for dry eyes.   levothyroxine (SYNTHROID) 50 MCG tablet Take 1 tablet (50 mcg total) by mouth daily before breakfast.   montelukast (SINGULAIR) 10 MG tablet Take 1 tablet (10 mg total) by mouth at bedtime.   nitrofurantoin, macrocrystal-monohydrate, (MACROBID) 100 MG capsule Take 1 capsule (100 mg total) by mouth 2 (two) times daily.   Potassium Chloride ER 20 MEQ TBCR TAKE 1 TABLET(20MEQ) BY Mouth twice daily   PROAIR HFA 108 (90 Base) MCG/ACT inhaler Inhale 2 puffs into the lungs every 4 (four) hours as needed for shortness of breath or wheezing.   spironolactone (ALDACTONE) 25 MG tablet Take 0.5 tablets (12.5 mg total)  by mouth daily.   Vitamin D, Ergocalciferol, (DRISDOL) 1.25 MG (50000 UNIT) CAPS capsule Take 1 capsule (50,000 Units total) by mouth every Monday.   warfarin (COUMADIN) 2.5 MG tablet Take 1 to 1&1/2 tablets by mouth daily as directed by the coumadin clinic   No facility-administered encounter medications on file as of 09/27/2021.   Elray Mcgregor,  Pharmacist Assistant  [phone]

## 2021-10-07 ENCOUNTER — Ambulatory Visit (INDEPENDENT_AMBULATORY_CARE_PROVIDER_SITE_OTHER): Payer: Medicare Other | Admitting: Family Medicine

## 2021-10-07 VITALS — BP 120/70 | HR 67 | Temp 96.7°F | Resp 18 | Ht 67.0 in | Wt 154.0 lb

## 2021-10-07 DIAGNOSIS — L98492 Non-pressure chronic ulcer of skin of other sites with fat layer exposed: Secondary | ICD-10-CM

## 2021-10-07 DIAGNOSIS — R051 Acute cough: Secondary | ICD-10-CM

## 2021-10-07 DIAGNOSIS — H1013 Acute atopic conjunctivitis, bilateral: Secondary | ICD-10-CM | POA: Diagnosis not present

## 2021-10-07 DIAGNOSIS — J301 Allergic rhinitis due to pollen: Secondary | ICD-10-CM | POA: Diagnosis not present

## 2021-10-07 DIAGNOSIS — H0520 Unspecified exophthalmos: Secondary | ICD-10-CM

## 2021-10-07 LAB — POCT INFLUENZA A/B
Influenza A, POC: NEGATIVE
Influenza B, POC: NEGATIVE

## 2021-10-07 LAB — POC COVID19 BINAXNOW: SARS Coronavirus 2 Ag: NEGATIVE

## 2021-10-07 MED ORDER — GENTEAL SEVERE 0.3 % OP GEL: Freq: Four times a day (QID) | OPHTHALMIC | 2 refills | Status: DC | PRN

## 2021-10-07 MED ORDER — OLOPATADINE HCL 0.1 % OP SOLN: 1.0000 [drp] | Freq: Two times a day (BID) | OPHTHALMIC | 2 refills | Status: DC

## 2021-10-07 MED ORDER — TRIAMCINOLONE ACETONIDE 40 MG/ML IJ SUSP
60.0000 mg | Freq: Once | INTRAMUSCULAR | Status: AC
  Administered 2021-10-07: 60 mg via INTRAMUSCULAR

## 2021-10-07 NOTE — Patient Instructions (Addendum)
Continue flonase and singulair.  Start loratidine 10 mg once daily. Start on patanol drops one drop into each eye twice a day  Eye ointment as prescribed.  Kenalog shot 60 mg given.  Get chest xray.

## 2021-10-07 NOTE — Progress Notes (Signed)
Acute Office Visit  Subjective:    Patient ID: Breanna Bennett, female    DOB: 1955/08/30, 66 y.o.   MRN: [id number]  Chief Complaint  Patient presents with   Cough    HPI: Patient is in today for cough, runny nose, cough, shortness of breath, and fatigue. Patient hs flonase which he headache been using. Covid and Flu negative today. Denies fever, earaches, or sore throat.   Past Medical History:  Diagnosis Date   Acute CVA (cerebrovascular accident) ([place]) 05/30/2019   Acute CVA (cerebrovascular accident) ([place]) 05/30/2019   Acute respiratory failure (HCC)    Acute systolic CHF (congestive heart failure) ([place]) 01/12/2019   Cardiac cachexia 59/45/8592   Chronic systolic CHF (congestive heart failure) (HCC)    Class 2 severe obesity due to excess calories with serious comorbidity and body mass index (BMI) of 38.0 to 38.9 in adult ([place]) 10/19/2018   Cough due to bronchospasm 02/16/2019   Elevated hemoglobin (HCC)    Elevated uric acid in blood 06/06/2019   Essential hypertension 01/12/2019   Gastroesophageal reflux disease 10/19/2018   GERD (gastroesophageal reflux disease)    History of Coumadin therapy 06/16/2019   History of CVA (cerebrovascular accident) 05/30/2019   History of gout 12/06/2019   History of hemiplegia 05/30/2019   Hospital discharge follow-up 06/24/2019   Hyperlipidemia    Hypertension    Hypokalemia 12/06/2019   Impaired mobility and ADLs 05/30/2019   Late effect of cerebrovascular accident (CVA) 12/12/2019   LBBB (left bundle branch block)    Left ventricular ejection fraction less than 20% 05/30/2019   Middle cerebral artery embolism, 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   Mild renal insufficiency    Mitral regurgitation    NICM (nonischemic cardiomyopathy) (HCC)    Nonischemic cardiomyopathy (HCC) cardiac catheterization May 2020 showed normal coronaries, ejection fraction 30% 02/09/2019   Open wound 1 year ago   Bilateral legs   Perennial allergic rhinitis 02/16/2019   Peripheral  edema 12/06/2019   Pre-diabetes    Stroke (cerebrum) (HCC) - R MCA infarct s/p mechanical thrombectomy 05/19/4627   Systolic CHF, acute ([place]) 02/09/2019   Tricuspid regurgitation    Type 2 diabetes mellitus with hyperglycemia, without long-term current use of insulin ([place]) 05/30/2019   Type 2 diabetes mellitus, uncontrolled 05/27/2019    Past Surgical History:  Procedure Laterality Date   IR ANGIO VERTEBRAL SEL SUBCLAVIAN INNOMINATE UNI R MOD SED  05/19/2019   IR CT HEAD LTD  05/19/2019   IR PERCUTANEOUS ART THROMBECTOMY/INFUSION INTRACRANIAL INC DIAG ANGIO  05/19/2019   RADIOLOGY WITH ANESTHESIA N/A 05/19/2019   Procedure: IR WITH ANESTHESIA;  Surgeon: Luanne Bras, MD;  Location: [facility];  Service: Radiology;  Laterality: N/A;   RIGHT/LEFT HEART CATH AND CORONARY ANGIOGRAPHY N/A 01/13/2019   Procedure: RIGHT/LEFT HEART CATH AND CORONARY ANGIOGRAPHY;  Surgeon: Lorretta Harp, MD;  Location: [facility];  Service: Cardiovascular;  Laterality: N/A;   TUBAL LIGATION      Family History  Problem Relation Age of Onset   CAD Mother    Heart failure Mother    Hypertension Mother    Colon cancer Father    Cancer Brother     Social History   Socioeconomic History   Marital status: Unknown    Spouse name: Not on file   Number of children: Not on file   Years of education: Not on file   Highest education level: Not on file  Occupational History   Not on file  Tobacco Use  Smoking status: Never   Smokeless tobacco: Never  Vaping Use   Vaping Use: Never used  Substance and Sexual Activity   Alcohol use: Never   Drug use: Never   Sexual activity: Not on file  Other Topics Concern   Not on file  Social History Narrative   Not on file   Social Determinants of Health   Financial Resource Strain: High Risk   Difficulty of Paying Living Expenses: Very hard  Food Insecurity: Not on file  Transportation Needs: Not on file  Physical Activity: Not on file  Stress: Not on  file  Social Connections: Not on file  Intimate Partner Violence: Not on file    Outpatient Medications Prior to Visit  Medication Sig Dispense Refill   allopurinol (ZYLOPRIM) 100 MG tablet TAKE 1 TABLET(100 MG) BY MOUTH TWICE DAILY 180 tablet 3   atorvastatin (LIPITOR) 10 MG tablet Take 1 tablet (10 mg total) by mouth daily. 90 tablet 1   carvedilol (COREG) 3.125 MG tablet Take 1 tablet (3.125 mg total) by mouth 2 (two) times daily with a meal. 180 tablet 1   dapagliflozin propanediol (FARXIGA) 5 MG TABS tablet TAKE 1 TABLET(5 MG) BY MOUTH DAILY BEFORE BREAKFAST 90 tablet 1   famotidine (PEPCID) 40 MG tablet Take 1 tablet (40 mg total) by mouth at bedtime. 90 tablet 3   fluticasone (FLONASE) 50 MCG/ACT nasal spray Place 2 sprays into both nostrils daily as needed for allergies or rhinitis. 18.2 mL 3   furosemide (LASIX) 40 MG tablet Take 1 tablet (40 mg total) by mouth 2 (two) times daily. 180 tablet 1   levothyroxine (SYNTHROID) 50 MCG tablet Take 1 tablet (50 mcg total) by mouth daily before breakfast. 90 tablet 1   montelukast (SINGULAIR) 10 MG tablet Take 1 tablet (10 mg total) by mouth at bedtime. 90 tablet 3   Potassium Chloride ER 20 MEQ TBCR TAKE 1 TABLET(20MEQ) BY Mouth twice daily 180 tablet 0   PROAIR HFA 108 (90 Base) MCG/ACT inhaler Inhale 2 puffs into the lungs every 4 (four) hours as needed for shortness of breath or wheezing. 3 each 1   spironolactone (ALDACTONE) 25 MG tablet Take 0.5 tablets (12.5 mg total) by mouth daily. 45 tablet 1   Vitamin D, Ergocalciferol, (DRISDOL) 1.25 MG (50000 UNIT) CAPS capsule Take 1 capsule (50,000 Units total) by mouth every Monday. 12 capsule 3   warfarin (COUMADIN) 2.5 MG tablet Take 1 to 1&1/2 tablets by mouth daily as directed by the coumadin clinic 100 tablet 0   hypromellose (GENTEAL SEVERE) 0.3 % GEL ophthalmic ointment Place into both eyes every 6 (six) hours as needed for dry eyes. 10 g 2   nitrofurantoin, macrocrystal-monohydrate,  (MACROBID) 100 MG capsule Take 1 capsule (100 mg total) by mouth 2 (two) times daily. 14 capsule 0   No facility-administered medications prior to visit.    Allergies  Allergen Reactions   Lisinopril Cough   Losartan Cough    Review of Systems  Constitutional:  Positive for fatigue.  HENT:  Positive for rhinorrhea and sinus pressure. Negative for congestion, ear pain and sore throat.   Eyes:  Positive for redness and itching. Negative for pain and discharge.  Respiratory:  Positive for cough and shortness of breath.   Cardiovascular:  Negative for chest pain and palpitations.  Gastrointestinal:  Negative for abdominal pain, nausea and vomiting.  Skin:  Positive for wound (wounds on BL legs. healing. seeing wound care.).  Objective:    Physical Exam Vitals reviewed.  Constitutional:      Appearance: Normal appearance.  HENT:     Right Ear: Tympanic membrane, ear canal and external ear normal.     Left Ear: Tympanic membrane, ear canal and external ear normal.     Nose: Congestion (pale turbinates. edematous.) present.     Comments: No sinus tenderness.    Mouth/Throat:     Pharynx: Oropharynx is clear.  Eyes:     Comments: Proptosis. Hyperemia.  Cardiovascular:     Rate and Rhythm: Normal rate and regular rhythm.     Heart sounds: Normal heart sounds. No murmur heard. Pulmonary:     Effort: Pulmonary effort is normal. No respiratory distress.     Breath sounds: Normal breath sounds.  Lymphadenopathy:     Cervical: No cervical adenopathy.  Neurological:     Mental Status: She is alert and oriented to person, place, and time.  Psychiatric:        Mood and Affect: Mood normal.        Behavior: Behavior normal.    BP 120/70    Pulse 67    Temp (!) 96.7 F (35.9 C)    Resp 18    Ht _0  (1.702 m)    Wt 154 lb (69.9 kg)    SpO2 99%    BMI 24.12 kg/m  Wt Readings from Last 3 Encounters:  10/07/21 154 lb (69.9 kg)  07/16/21 154 lb (69.9 kg)  07/08/21 158 lb (71.7  kg)    Health Maintenance Due  Topic Date Due   OPHTHALMOLOGY EXAM  Never done   Hepatitis C Screening  Never done   Zoster Vaccines- Shingrix (1 of 2) Never done   PAP SMEAR-Modifier  Never done   MAMMOGRAM  08/11/2020   TETANUS/TDAP  01/16/2021   DEXA SCAN  Never done    There are no preventive care reminders to display for this patient.   Lab Results  Component Value Date   TSH 2.960 08/08/2020   Lab Results  Component Value Date   WBC 5.6 07/16/2021   HGB 13.8 07/16/2021   HCT 42.4 07/16/2021   MCV 80 07/16/2021   PLT 234 07/16/2021   Lab Results  Component Value Date   NA 137 07/16/2021   K 3.4 (L) 07/16/2021   CO2 22 07/16/2021   GLUCOSE 121 (H) 07/16/2021   BUN 31 (H) 07/16/2021   CREATININE 0.84 07/16/2021   BILITOT 1.3 (H) 07/16/2021   ALKPHOS 126 (H) 07/16/2021   AST 14 07/16/2021   ALT 5 07/16/2021   PROT 8.8 (H) 07/16/2021   ALBUMIN 3.6 (L) 07/16/2021   CALCIUM 9.4 07/16/2021   ANIONGAP 16 (H) 05/30/2019   EGFR 77 07/16/2021   Lab Results  Component Value Date   CHOL 76 (L) 04/10/2021   Lab Results  Component Value Date   HDL 22 (L) 04/10/2021   Lab Results  Component Value Date   LDLCALC 42 04/10/2021   Lab Results  Component Value Date   TRIG 45 04/10/2021   Lab Results  Component Value Date   CHOLHDL 3.5 04/10/2021   Lab Results  Component Value Date   HGBA1C 6.9 (H) 07/16/2021       Assessment & Plan:   Problem List Items Addressed This Visit       Respiratory   Seasonal allergic rhinitis due to pollen    Continue flonase and singulair.  Start loratidine 10 mg once  daily. Start on patanol drops one drop into each eye twice a day  Eye ointment as prescribed.  Kenalog shot 60 mg given.          Musculoskeletal and Integument   Skin ulcer, stage 3 ([place])    Continue wound care treatments at wound care center.         Other   Acute cough - Primary    Get chest xray.      Relevant Orders   POC COVID-19  (Completed)   POCT Influenza A/B (Completed)   DG Chest 2 View   Allergic conjunctivitis of both eyes    patanol drops.      Relevant Medications   olopatadine (PATANOL) 0.1 % ophthalmic solution   Proptosis    genteal ointmen      Relevant Medications   hypromellose (GENTEAL SEVERE) 0.3 % GEL ophthalmic ointment   Meds ordered this encounter  Medications   hypromellose (GENTEAL SEVERE) 0.3 % GEL ophthalmic ointment    Sig: Place into both eyes every 6 (six) hours as needed for dry eyes.    Dispense:  10 g    Refill:  2   olopatadine (PATANOL) 0.1 % ophthalmic solution    Sig: Place 1 drop into both eyes 2 (two) times daily.    Dispense:  5 mL    Refill:  2   triamcinolone acetonide (KENALOG-40) injection 60 mg    Orders Placed This Encounter  Procedures   DG Chest 2 View   POC COVID-19   POCT Influenza A/B     Follow-up: No follow-ups on file.  An After Visit Summary was printed and given to the patient.  Rochel Brome, MD [facility] [phone]

## 2021-10-10 ENCOUNTER — Ambulatory Visit (INDEPENDENT_AMBULATORY_CARE_PROVIDER_SITE_OTHER): Payer: Self-pay | Admitting: Nurse Practitioner

## 2021-10-10 DIAGNOSIS — I110 Hypertensive heart disease with heart failure: Secondary | ICD-10-CM

## 2021-10-10 NOTE — Progress Notes (Signed)
Cancelled.  

## 2021-10-13 ENCOUNTER — Encounter: Payer: Self-pay | Admitting: Family Medicine

## 2021-10-13 ENCOUNTER — Other Ambulatory Visit: Payer: Self-pay | Admitting: Family Medicine

## 2021-10-13 DIAGNOSIS — R051 Acute cough: Secondary | ICD-10-CM | POA: Insufficient documentation

## 2021-10-13 DIAGNOSIS — J301 Allergic rhinitis due to pollen: Secondary | ICD-10-CM | POA: Insufficient documentation

## 2021-10-13 DIAGNOSIS — H0520 Unspecified exophthalmos: Secondary | ICD-10-CM | POA: Insufficient documentation

## 2021-10-13 DIAGNOSIS — H1013 Acute atopic conjunctivitis, bilateral: Secondary | ICD-10-CM | POA: Insufficient documentation

## 2021-10-13 NOTE — Assessment & Plan Note (Signed)
Continue flonase and singulair.  Start loratidine 10 mg once daily. Start on patanol drops one drop into each eye twice a day  Eye ointment as prescribed.  Kenalog shot 60 mg given.

## 2021-10-13 NOTE — Assessment & Plan Note (Signed)
Continue wound care treatments at wound care center.

## 2021-10-13 NOTE — Assessment & Plan Note (Signed)
patanol drops 

## 2021-10-13 NOTE — Assessment & Plan Note (Signed)
genteal ointmen

## 2021-10-13 NOTE — Assessment & Plan Note (Signed)
Get chest xray

## 2021-10-14 DIAGNOSIS — R051 Acute cough: Secondary | ICD-10-CM | POA: Diagnosis not present

## 2021-10-15 ENCOUNTER — Other Ambulatory Visit: Payer: Self-pay

## 2021-10-15 ENCOUNTER — Ambulatory Visit (INDEPENDENT_AMBULATORY_CARE_PROVIDER_SITE_OTHER): Payer: Medicare Other

## 2021-10-15 DIAGNOSIS — Z5181 Encounter for therapeutic drug level monitoring: Secondary | ICD-10-CM | POA: Diagnosis not present

## 2021-10-15 DIAGNOSIS — Z9229 Personal history of other drug therapy: Secondary | ICD-10-CM | POA: Diagnosis not present

## 2021-10-15 LAB — POCT INR: INR: 2.3 (ref 2.0–3.0)

## 2021-10-15 NOTE — Patient Instructions (Signed)
Description   Continue taking 1 tablet daily, except Wednesday take 1.5 tablets.  Be consistent with greens (2 servings per week)  Recheck in 5 weeks [facility]in [facility] [phone]

## 2021-10-22 ENCOUNTER — Other Ambulatory Visit: Payer: Self-pay

## 2021-10-22 ENCOUNTER — Telehealth: Payer: Self-pay | Admitting: Cardiology

## 2021-10-22 DIAGNOSIS — R051 Acute cough: Secondary | ICD-10-CM

## 2021-10-22 NOTE — Telephone Encounter (Signed)
Called patient and she was looking at the disability paperwork and she stated that it says that the doctors office should not complete the forms. She did not see this when she sent the message earlier asking if the doctor's office could be the third party. Patient had no further questions at this time.

## 2021-10-22 NOTE — Telephone Encounter (Signed)
Patient's daughter is calling wanting to know if our office would be considered the 3rd party that needs to feel out the patient's disability form. Please advise.

## 2021-10-23 ENCOUNTER — Ambulatory Visit: Payer: Medicare Other | Admitting: Family Medicine

## 2021-10-25 ENCOUNTER — Other Ambulatory Visit: Payer: Self-pay

## 2021-10-25 MED ORDER — DAPAGLIFLOZIN PROPANEDIOL 5 MG PO TABS: ORAL_TABLET | ORAL | 1 refills | Status: DC

## 2021-11-06 ENCOUNTER — Encounter: Payer: Self-pay | Admitting: Family Medicine

## 2021-11-06 ENCOUNTER — Ambulatory Visit (INDEPENDENT_AMBULATORY_CARE_PROVIDER_SITE_OTHER): Payer: Medicare Other | Admitting: Family Medicine

## 2021-11-06 ENCOUNTER — Other Ambulatory Visit: Payer: Self-pay

## 2021-11-06 VITALS — BP 112/84 | HR 74 | Temp 98.9°F | Ht 67.0 in | Wt 155.0 lb

## 2021-11-06 DIAGNOSIS — I5022 Chronic systolic (congestive) heart failure: Secondary | ICD-10-CM | POA: Diagnosis not present

## 2021-11-06 DIAGNOSIS — E1121 Type 2 diabetes mellitus with diabetic nephropathy: Secondary | ICD-10-CM | POA: Diagnosis not present

## 2021-11-06 DIAGNOSIS — R946 Abnormal results of thyroid function studies: Secondary | ICD-10-CM | POA: Diagnosis not present

## 2021-11-06 DIAGNOSIS — H0520 Unspecified exophthalmos: Secondary | ICD-10-CM

## 2021-11-06 DIAGNOSIS — E782 Mixed hyperlipidemia: Secondary | ICD-10-CM

## 2021-11-06 DIAGNOSIS — K219 Gastro-esophageal reflux disease without esophagitis: Secondary | ICD-10-CM | POA: Diagnosis not present

## 2021-11-06 DIAGNOSIS — I110 Hypertensive heart disease with heart failure: Secondary | ICD-10-CM

## 2021-11-06 MED ORDER — AZELASTINE HCL 0.05 % OP SOLN
1.0000 [drp] | Freq: Two times a day (BID) | OPHTHALMIC | 12 refills | Status: DC
Start: 2021-11-06 — End: 2022-11-19

## 2021-11-06 NOTE — Progress Notes (Signed)
? ?Subjective:  ?Patient ID: Breanna Bennett, female    DOB: 1956/05/03  Age: 66 y.o. MRN: [id number] ? ?Chief Complaint  ?Patient presents with  ? Diabetes  ? Hyperlipidemia  ? Hypertension  ? ? ?Diabetes:  ?Glucose checking: daily  ?Glucose logs: 90-100s ?Hypoglycemia:no ?Most recent A1C: 6.0 ?Current medications: Farxiga 5 mg daily ,  ?Last Eye Exam:  Few years ago ?Foot checks: Daily ? ?Hyperlipidemia: ?Current medications: Atorvastatin 10 mg take 1 tablet daily. ? ?Hypertension: ?Current medications:Carvedilol 3.125 mg 1 tablet twice daily, Aldactone 25 mg take 1/2 tablet daily, potassium 20 MeQ take 1 tablet BID. ? ?Hypothyroidism: Patient is taking Levothyroxine 50 mg 1 tablet daily. ? ?Vitamin D deficiency: Patient is taking 50,000 mcg once weekly. ? ?GERD: Famotidine 40 mg take 1 tablet at bed time,  ? ?  ?Current Outpatient Medications on File Prior to Visit  ?Medication Sig Dispense Refill  ? Accu-Chek FastClix Lancets MISC See admin instructions.    ? Accu-Chek Softclix Lancets lancets USE AS DIRECTED TO CHECK BLOOD SUGAR    ? allopurinol (ZYLOPRIM) 100 MG tablet TAKE 1 TABLET(100 MG) BY MOUTH TWICE DAILY 180 tablet 3  ? atorvastatin (LIPITOR) 10 MG tablet Take 1 tablet (10 mg total) by mouth daily. 90 tablet 1  ? carvedilol (COREG) 3.125 MG tablet Take 1 tablet (3.125 mg total) by mouth 2 (two) times daily with a meal. 180 tablet 1  ? dapagliflozin propanediol (FARXIGA) 5 MG TABS tablet TAKE 1 TABLET(5 MG) BY MOUTH DAILY BEFORE BREAKFAST 90 tablet 1  ? famotidine (PEPCID) 40 MG tablet Take 1 tablet (40 mg total) by mouth at bedtime. 90 tablet 3  ? fluticasone (FLONASE) 50 MCG/ACT nasal spray Place 2 sprays into both nostrils daily as needed for allergies or rhinitis. 18.2 mL 3  ? furosemide (LASIX) 40 MG tablet Take 1 tablet (40 mg total) by mouth 2 (two) times daily. 180 tablet 1  ? glucose blood (ACCU-CHEK GUIDE) test strip USE TWICE A DAY 100 strip 0  ? hypromellose (GENTEAL SEVERE) 0.3 % GEL  ophthalmic ointment Place into both eyes every 6 (six) hours as needed for dry eyes. 10 g 2  ? levothyroxine (SYNTHROID) 50 MCG tablet Take 1 tablet (50 mcg total) by mouth daily before breakfast. 90 tablet 1  ? montelukast (SINGULAIR) 10 MG tablet Take 1 tablet (10 mg total) by mouth at bedtime. 90 tablet 3  ? olopatadine (PATANOL) 0.1 % ophthalmic solution Place 1 drop into both eyes 2 (two) times daily. 5 mL 2  ? Potassium Chloride ER 20 MEQ TBCR TAKE 1 TABLET(20MEQ) BY Mouth twice daily 180 tablet 0  ? PROAIR HFA 108 (90 Base) MCG/ACT inhaler Inhale 2 puffs into the lungs every 4 (four) hours as needed for shortness of breath or wheezing. 3 each 1  ? spironolactone (ALDACTONE) 25 MG tablet Take 0.5 tablets (12.5 mg total) by mouth daily. 45 tablet 1  ? Vitamin D, Ergocalciferol, (DRISDOL) 1.25 MG (50000 UNIT) CAPS capsule Take 1 capsule (50,000 Units total) by mouth every Monday. 12 capsule 3  ? warfarin (COUMADIN) 2.5 MG tablet Take 1 to 1&1/2 tablets by mouth daily as directed by the coumadin clinic 100 tablet 0  ? ?No current facility-administered medications on file prior to visit.  ? ?Past Medical History:  ?Diagnosis Date  ? Acute CVA (cerebrovascular accident) (HCC) 05/30/2019  ? Acute CVA (cerebrovascular accident) (HCC) 05/30/2019  ? Acute respiratory failure (HCC)   ? Acute systolic CHF (congestive heart failure) (  HCC) 01/12/2019  ? Cardiac cachexia 07/04/2020  ? Chronic systolic CHF (congestive heart failure) (HCC)   ? Class 2 severe obesity due to excess calories with serious comorbidity and body mass index (BMI) of 38.0 to 38.9 in adult [facility]) 10/19/2018  ? Cough due to bronchospasm 02/16/2019  ? Elevated hemoglobin (HCC)   ? Elevated uric acid in blood 06/06/2019  ? Essential hypertension 01/12/2019  ? Gastroesophageal reflux disease 10/19/2018  ? GERD (gastroesophageal reflux disease)   ? History of Coumadin therapy 06/16/2019  ? History of CVA (cerebrovascular accident) 05/30/2019  ? History of gout  12/06/2019  ? History of hemiplegia 05/30/2019  ? Hospital discharge follow-up 06/24/2019  ? Hyperlipidemia   ? Hypertension   ? Hypokalemia 12/06/2019  ? Impaired mobility and ADLs 05/30/2019  ? Late effect of cerebrovascular accident (CVA) 12/12/2019  ? LBBB (left bundle branch block)   ? Left ventricular ejection fraction less than 20% 05/30/2019  ? Middle cerebral artery embolism, 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  ? Mild renal insufficiency   ? Mitral regurgitation   ? NICM (nonischemic cardiomyopathy) (HCC)   ? Nonischemic cardiomyopathy [facility]) cardiac catheterization May 2020 showed normal coronaries, ejection fraction 30% 02/09/2019  ? Open wound 1 year ago  ? Bilateral legs  ? Perennial allergic rhinitis 02/16/2019  ? Peripheral edema 12/06/2019  ? Pre-diabetes   ? Stroke (cerebrum) (HCC) - R MCA infarct s/p mechanical thrombectomy 05/19/2019  ? Systolic CHF, acute (HCC) 02/09/2019  ? Tricuspid regurgitation   ? Type 2 diabetes mellitus with hyperglycemia, without long-term current use of insulin (HCC) 05/30/2019  ? Type 2 diabetes mellitus, uncontrolled 05/27/2019  ? ?Past Surgical History:  ?Procedure Laterality Date  ? IR ANGIO VERTEBRAL SEL SUBCLAVIAN INNOMINATE UNI R MOD SED  05/19/2019  ? IR CT HEAD LTD  05/19/2019  ? IR PERCUTANEOUS ART THROMBECTOMY/INFUSION INTRACRANIAL INC DIAG ANGIO  05/19/2019  ? RADIOLOGY WITH ANESTHESIA N/A 05/19/2019  ? Procedure: IR WITH ANESTHESIA;  Surgeon: Julieanne Cotton, MD;  Location: [facility] OR;  Service: Radiology;  Laterality: N/A;  ? RIGHT/LEFT HEART CATH AND CORONARY ANGIOGRAPHY N/A 01/13/2019  ? Procedure: RIGHT/LEFT HEART CATH AND CORONARY ANGIOGRAPHY;  Surgeon: Runell Gess, MD;  Location: [facility] INVASIVE CV LAB;  Service: Cardiovascular;  Laterality: N/A;  ? TUBAL LIGATION    ?  ?Family History  ?Problem Relation Age of Onset  ? CAD Mother   ? Heart failure Mother   ? Hypertension Mother   ? Colon cancer Father   ? Cancer Brother   ? ?Social History  ? ?Socioeconomic History  ? Marital status:  Unknown  ?  Spouse name: Not on file  ? Number of children: Not on file  ? Years of education: Not on file  ? Highest education level: Not on file  ?Occupational History  ? Not on file  ?Tobacco Use  ? Smoking status: Never  ? Smokeless tobacco: Never  ?Vaping Use  ? Vaping Use: Never used  ?Substance and Sexual Activity  ? Alcohol use: Never  ? Drug use: Never  ? Sexual activity: Not on file  ?Other Topics Concern  ? Not on file  ?Social History Narrative  ? Not on file  ? ?Social Determinants of Health  ? ?Financial Resource Strain: High Risk  ? Difficulty of Paying Living Expenses: Very hard  ?Food Insecurity: Not on file  ?Transportation Needs: Not on file  ?Physical Activity: Not on file  ?Stress: Not on file  ?Social Connections: Not on file  ? ? ?  Review of Systems  ?Constitutional:  Negative for appetite change, fatigue and fever.  ?HENT:  Negative for congestion, ear pain, sinus pressure and sore throat.   ?Respiratory:  Negative for cough, chest tightness, shortness of breath and wheezing.   ?Cardiovascular:  Negative for chest pain and palpitations.  ?Gastrointestinal:  Negative for abdominal pain, constipation, diarrhea, nausea and vomiting.  ?Genitourinary:  Negative for dysuria and hematuria.  ?Musculoskeletal:  Negative for arthralgias, back pain, joint swelling and myalgias.  ?Skin:  Negative for rash.  ?Neurological:  Negative for dizziness, weakness and headaches.  ?Psychiatric/Behavioral:  Negative for dysphoric mood. The patient is not nervous/anxious.   ? ? ?Objective:  ?BP 112/84 (BP Location: Right Arm, Patient Position: Sitting)   Pulse 74   Temp 98.9 ?F (37.2 ?C) (Oral)   Ht 5\' 7"  (1.702 m)   Wt 155 lb (70.3 kg)   SpO2 98%   BMI 24.28 kg/m?  ? ?BP/Weight 11/06/2021 10/07/2021 07/16/2021  ?Systolic BP 112 120 104  ?Diastolic BP 84 70 76  ?Wt. (Lbs) 155 154 154  ?BMI 24.28 24.12 23.42  ? ? ?Physical Exam ?Vitals reviewed.  ?Constitutional:   ?   Appearance: Normal appearance. She is normal  weight.  ?Neck:  ?   Vascular: No carotid bruit.  ?Cardiovascular:  ?   Rate and Rhythm: Normal rate and regular rhythm.  ?   Heart sounds: Normal heart sounds.  ?Pulmonary:  ?   Effort: Pulmonary effort is normal. No respi

## 2021-11-07 DIAGNOSIS — R946 Abnormal results of thyroid function studies: Secondary | ICD-10-CM | POA: Insufficient documentation

## 2021-11-07 LAB — COMPREHENSIVE METABOLIC PANEL
ALT: 4 IU/L (ref 0–32)
AST: 16 IU/L (ref 0–40)
Albumin/Globulin Ratio: 0.7 — ABNORMAL LOW (ref 1.2–2.2)
Albumin: 3.5 g/dL — ABNORMAL LOW (ref 3.8–4.8)
Alkaline Phosphatase: 101 IU/L (ref 44–121)
BUN/Creatinine Ratio: 30 — ABNORMAL HIGH (ref 12–28)
BUN: 27 mg/dL (ref 8–27)
Bilirubin Total: 0.8 mg/dL (ref 0.0–1.2)
CO2: 22 mmol/L (ref 20–29)
Calcium: 9.6 mg/dL (ref 8.7–10.3)
Chloride: 104 mmol/L (ref 96–106)
Creatinine, Ser: 0.9 mg/dL (ref 0.57–1.00)
Globulin, Total: 5.2 g/dL — ABNORMAL HIGH (ref 1.5–4.5)
Glucose: 92 mg/dL (ref 70–99)
Potassium: 5.4 mmol/L — ABNORMAL HIGH (ref 3.5–5.2)
Sodium: 138 mmol/L (ref 134–144)
Total Protein: 8.7 g/dL — ABNORMAL HIGH (ref 6.0–8.5)
eGFR: 71 mL/min/{1.73_m2} (ref >59)

## 2021-11-07 LAB — LIPID PANEL
Chol/HDL Ratio: 3.8 ratio (ref 0.0–4.4)
Cholesterol, Total: 124 mg/dL (ref 100–199)
HDL: 33 mg/dL — ABNORMAL LOW (ref >39)
LDL Chol Calc (NIH): 79 mg/dL (ref 0–99)
Triglycerides: 55 mg/dL (ref 0–149)
VLDL Cholesterol Cal: 12 mg/dL (ref 5–40)

## 2021-11-07 LAB — MICROALBUMIN / CREATININE URINE RATIO
Creatinine, Urine: 99.5 mg/dL
Microalb/Creat Ratio: 37 mg/g creat — ABNORMAL HIGH (ref 0–29)
Microalbumin, Urine: 37.2 ug/mL

## 2021-11-07 LAB — HEMOGLOBIN A1C
Est. average glucose Bld gHb Est-mCnc: 151 mg/dL
Hgb A1c MFr Bld: 6.9 % — ABNORMAL HIGH (ref 4.8–5.6)

## 2021-11-07 LAB — CBC WITH DIFF/PLATELET
Basophils Absolute: 0.1 10*3/uL (ref 0.0–0.2)
Basos: 1 %
EOS (ABSOLUTE): 0.3 10*3/uL (ref 0.0–0.4)
Eos: 4 %
Hematocrit: 45.7 % (ref 34.0–46.6)
Hemoglobin: 14.9 g/dL (ref 11.1–15.9)
Immature Grans (Abs): 0 10*3/uL (ref 0.0–0.1)
Immature Granulocytes: 1 %
Lymphocytes Absolute: 1.9 10*3/uL (ref 0.7–3.1)
Lymphs: 26 %
MCH: 27.3 pg (ref 26.6–33.0)
MCHC: 32.6 g/dL (ref 31.5–35.7)
MCV: 84 fL (ref 79–97)
Monocytes Absolute: 0.6 10*3/uL (ref 0.1–0.9)
Monocytes: 8 %
Neutrophils Absolute: 4.5 10*3/uL (ref 1.4–7.0)
Neutrophils: 60 %
Platelets: 212 10*3/uL (ref 150–450)
RBC: 5.46 x10E6/uL — ABNORMAL HIGH (ref 3.77–5.28)
RDW: 18.6 % — ABNORMAL HIGH (ref 11.7–15.4)
WBC: 7.4 10*3/uL (ref 3.4–10.8)

## 2021-11-07 LAB — T4, FREE: Free T4: 1.51 ng/dL (ref 0.82–1.77)

## 2021-11-07 LAB — TSH: TSH: 1.43 u[IU]/mL (ref 0.450–4.500)

## 2021-11-07 NOTE — Assessment & Plan Note (Signed)
Continue Synthroid at current dose Levothyroxine 50 mg daily. ?Rechecked TSH and adjust Synthroid as indicated  ?

## 2021-11-07 NOTE — Assessment & Plan Note (Signed)
Control: Good. ?Recommend check sugars fasting daily. ?Recommend check feet daily. ?Recommend annual eye exams. ?Medicines: Farxiga 5 mg daily. ?Continue to work on eating a healthy diet and exercise.  ?Labs drawn today.   ?

## 2021-11-07 NOTE — Assessment & Plan Note (Signed)
The current medical regimen is effective;  continue present plan and medication Famotidine 40 mg take 1 tablet at bed time ?

## 2021-11-07 NOTE — Assessment & Plan Note (Addendum)
Well controlled.  ?No changes to medicines. Continue with Aldactone 25 mg 1/2 tablet daily. ?Continue to work on eating a healthy diet and exercise.  ?Labs drawn today.  ?

## 2021-11-07 NOTE — Assessment & Plan Note (Signed)
Well controlled.  ?No changes to medicines. Continue with Atorvastatin 10 mg take 1 tablet daily. ?Continue to work on eating a healthy diet and exercise.  ?Labs drawn today.  ?

## 2021-11-07 NOTE — Assessment & Plan Note (Signed)
Gel sent to pharmacy ?

## 2021-11-07 NOTE — Assessment & Plan Note (Addendum)
The current medical regimen is effective;  continue present plan and medications Furosemide 40 mg twice a day and Carvedilol 3.125 mg 1 tablet twice a day.  ?Managed per specialist. ?

## 2021-11-08 ENCOUNTER — Encounter: Payer: Self-pay | Admitting: Cardiology

## 2021-11-08 ENCOUNTER — Other Ambulatory Visit: Payer: Self-pay

## 2021-11-08 ENCOUNTER — Ambulatory Visit (INDEPENDENT_AMBULATORY_CARE_PROVIDER_SITE_OTHER): Payer: Medicare Other | Admitting: Cardiology

## 2021-11-08 VITALS — BP 96/66 | HR 68 | Ht 67.0 in | Wt 163.0 lb

## 2021-11-08 DIAGNOSIS — I340 Nonrheumatic mitral (valve) insufficiency: Secondary | ICD-10-CM

## 2021-11-08 DIAGNOSIS — I5022 Chronic systolic (congestive) heart failure: Secondary | ICD-10-CM

## 2021-11-08 DIAGNOSIS — I428 Other cardiomyopathies: Secondary | ICD-10-CM

## 2021-11-08 DIAGNOSIS — Z8673 Personal history of transient ischemic attack (TIA), and cerebral infarction without residual deficits: Secondary | ICD-10-CM

## 2021-11-08 DIAGNOSIS — I447 Left bundle-branch block, unspecified: Secondary | ICD-10-CM

## 2021-11-08 MED ORDER — DAPAGLIFLOZIN PROPANEDIOL 10 MG PO TABS: 10.0000 mg | ORAL_TABLET | Freq: Every day | ORAL | 2 refills | Status: DC

## 2021-11-08 NOTE — Patient Instructions (Signed)

## 2021-11-08 NOTE — Progress Notes (Signed)
?Cardiology Office Note:   ? ?Date:  11/08/2021  ? ?ID:  Breanna Bennett, DOB September 10, 1955, MRN [id number] ? ?PCP:  Blane Ohara, MD  ?Cardiologist:  Gypsy Balsam, MD   ? ?Referring MD: Blane Ohara, MD  ? ?Chief Complaint  ?Patient presents with  ? Follow-up  ?Doing well ? ?History of Present Illness:   ? ?Breanna Bennett is a 66 y.o. female with past medical history significant for nonischemic cardiomyopathy ejection fraction 20 to 25%, history of CVA, left bundle branch block, dyslipidemia, essential hypertension, congestive heart failure which is new heart association class III.  She is refusing any intervention meaning she does not want to have any testing done on her heart no aggressive measures no ICD no BiV pacing.  She is coming to my office for follow-up.  Overall she seems to be doing quite well she said that swelling of lower extremities improved significantly.  She does have some exertional shortness of breath no chest pain tightness squeezing pressure burning chest. ? ?Past Medical History:  ?Diagnosis Date  ? Acute CVA (cerebrovascular accident) (HCC) 05/30/2019  ? Acute CVA (cerebrovascular accident) (HCC) 05/30/2019  ? Acute respiratory failure (HCC)   ? Acute systolic CHF (congestive heart failure) (HCC) 01/12/2019  ? Cardiac cachexia 07/04/2020  ? Chronic systolic CHF (congestive heart failure) (HCC)   ? Class 2 severe obesity due to excess calories with serious comorbidity and body mass index (BMI) of 38.0 to 38.9 in adult [facility]) 10/19/2018  ? Cough due to bronchospasm 02/16/2019  ? Elevated hemoglobin (HCC)   ? Elevated uric acid in blood 06/06/2019  ? Essential hypertension 01/12/2019  ? Gastroesophageal reflux disease 10/19/2018  ? GERD (gastroesophageal reflux disease)   ? History of Coumadin therapy 06/16/2019  ? History of CVA (cerebrovascular accident) 05/30/2019  ? History of gout 12/06/2019  ? History of hemiplegia 05/30/2019  ? Hospital discharge follow-up 06/24/2019  ? Hyperlipidemia   ?  Hypertension   ? Hypokalemia 12/06/2019  ? Impaired mobility and ADLs 05/30/2019  ? Late effect of cerebrovascular accident (CVA) 12/12/2019  ? LBBB (left bundle branch block)   ? Left ventricular ejection fraction less than 20% 05/30/2019  ? Middle cerebral artery embolism, 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  ? Mild renal insufficiency   ? Mitral regurgitation   ? NICM (nonischemic cardiomyopathy) (HCC)   ? Nonischemic cardiomyopathy [facility]) cardiac catheterization May 2020 showed normal coronaries, ejection fraction 30% 02/09/2019  ? Open wound 1 year ago  ? Bilateral legs  ? Perennial allergic rhinitis 02/16/2019  ? Peripheral edema 12/06/2019  ? Pre-diabetes   ? Stroke (cerebrum) (HCC) - R MCA infarct s/p mechanical thrombectomy 05/19/2019  ? Systolic CHF, acute (HCC) 02/09/2019  ? Tricuspid regurgitation   ? Type 2 diabetes mellitus with hyperglycemia, without long-term current use of insulin (HCC) 05/30/2019  ? Type 2 diabetes mellitus, uncontrolled 05/27/2019  ? ? ?Past Surgical History:  ?Procedure Laterality Date  ? IR ANGIO VERTEBRAL SEL SUBCLAVIAN INNOMINATE UNI R MOD SED  05/19/2019  ? IR CT HEAD LTD  05/19/2019  ? IR PERCUTANEOUS ART THROMBECTOMY/INFUSION INTRACRANIAL INC DIAG ANGIO  05/19/2019  ? RADIOLOGY WITH ANESTHESIA N/A 05/19/2019  ? Procedure: IR WITH ANESTHESIA;  Surgeon: Julieanne Cotton, MD;  Location: [facility] OR;  Service: Radiology;  Laterality: N/A;  ? RIGHT/LEFT HEART CATH AND CORONARY ANGIOGRAPHY N/A 01/13/2019  ? Procedure: RIGHT/LEFT HEART CATH AND CORONARY ANGIOGRAPHY;  Surgeon: Runell Gess, MD;  Location: [facility] INVASIVE CV LAB;  Service: Cardiovascular;  Laterality: N/A;  ?  TUBAL LIGATION    ? ? ?Current Medications: ?Current Meds  ?Medication Sig  ? Accu-Chek FastClix Lancets MISC 1 each by Other route See admin instructions. Glucose  ? Accu-Chek Softclix Lancets lancets 1 each by Other route See admin instructions. Glucose check  ? allopurinol (ZYLOPRIM) 100 MG tablet TAKE 1 TABLET(100 MG) BY MOUTH TWICE DAILY  ?  atorvastatin (LIPITOR) 10 MG tablet Take 1 tablet (10 mg total) by mouth daily.  ? azelastine (OPTIVAR) 0.05 % ophthalmic solution Place 1 drop into both eyes 2 (two) times daily.  ? carvedilol (COREG) 3.125 MG tablet Take 1 tablet (3.125 mg total) by mouth 2 (two) times daily with a meal.  ? dapagliflozin propanediol (FARXIGA) 10 MG TABS tablet Take 1 tablet (10 mg total) by mouth daily before breakfast.  ? famotidine (PEPCID) 40 MG tablet Take 1 tablet (40 mg total) by mouth at bedtime.  ? fluticasone (FLONASE) 50 MCG/ACT nasal spray Place 2 sprays into both nostrils daily as needed for allergies or rhinitis.  ? furosemide (LASIX) 40 MG tablet Take 1 tablet (40 mg total) by mouth 2 (two) times daily.  ? glucose blood (ACCU-CHEK GUIDE) test strip USE TWICE A DAY  ? hypromellose (GENTEAL SEVERE) 0.3 % GEL ophthalmic ointment Place into both eyes every 6 (six) hours as needed for dry eyes.  ? levothyroxine (SYNTHROID) 50 MCG tablet Take 1 tablet (50 mcg total) by mouth daily before breakfast.  ? montelukast (SINGULAIR) 10 MG tablet Take 1 tablet (10 mg total) by mouth at bedtime.  ? olopatadine (PATANOL) 0.1 % ophthalmic solution Place 1 drop into both eyes 2 (two) times daily.  ? Potassium Chloride ER 20 MEQ TBCR TAKE 1 TABLET(20MEQ) BY Mouth twice daily  ? PROAIR HFA 108 (90 Base) MCG/ACT inhaler Inhale 2 puffs into the lungs every 4 (four) hours as needed for shortness of breath or wheezing.  ? spironolactone (ALDACTONE) 25 MG tablet Take 0.5 tablets (12.5 mg total) by mouth daily.  ? Vitamin D, Ergocalciferol, (DRISDOL) 1.25 MG (50000 UNIT) CAPS capsule Take 1 capsule (50,000 Units total) by mouth every Monday.  ? warfarin (COUMADIN) 2.5 MG tablet Take 1 to 1&1/2 tablets by mouth daily as directed by the coumadin clinic  ?  ? ?Allergies:   Lisinopril and Losartan  ? ?Social History  ? ?Socioeconomic History  ? Marital status: Unknown  ?  Spouse name: Not on file  ? Number of children: Not on file  ? Years of  education: Not on file  ? Highest education level: Not on file  ?Occupational History  ? Not on file  ?Tobacco Use  ? Smoking status: Never  ? Smokeless tobacco: Never  ?Vaping Use  ? Vaping Use: Never used  ?Substance and Sexual Activity  ? Alcohol use: Never  ? Drug use: Never  ? Sexual activity: Not on file  ?Other Topics Concern  ? Not on file  ?Social History Narrative  ? Not on file  ? ?Social Determinants of Health  ? ?Financial Resource Strain: High Risk  ? Difficulty of Paying Living Expenses: Very hard  ?Food Insecurity: Not on file  ?Transportation Needs: Not on file  ?Physical Activity: Not on file  ?Stress: Not on file  ?Social Connections: Not on file  ?  ? ?Family History: ?The patient's family history includes CAD in her mother; Cancer in her brother; Colon cancer in her father; Heart failure in her mother; Hypertension in her mother. ?ROS:   ?Please see the history  of present illness.    ?All 14 point review of systems negative except as described per history of present illness ? ?EKGs/Labs/Other Studies Reviewed:   ? ? ? ?Recent Labs: ?07/08/2021: NT-Pro BNP 3,562 ?11/06/2021: ALT 4; BUN 27; Creatinine, Ser 0.90; Hemoglobin 14.9; Platelets 212; Potassium 5.4; Sodium 138; TSH 1.430  ?Recent Lipid Panel ?   ?Component Value Date/Time  ? CHOL 124 11/06/2021 1151  ? TRIG 55 11/06/2021 1151  ? HDL 33 (L) 11/06/2021 1151  ? CHOLHDL 3.8 11/06/2021 1151  ? CHOLHDL 6.4 05/20/2019 0441  ? VLDL 18 05/20/2019 0441  ? LDLCALC 79 11/06/2021 1151  ? ? ?Physical Exam:   ? ?VS:  BP 96/66 (BP Location: Left Arm, Patient Position: Sitting)   Pulse 68   Ht 5\' 7"  (1.702 m)   Wt 163 lb (73.9 kg)   SpO2 97%   BMI 25.53 kg/m?    ? ?Wt Readings from Last 3 Encounters:  ?11/08/21 163 lb (73.9 kg)  ?11/06/21 155 lb (70.3 kg)  ?10/07/21 154 lb (69.9 kg)  ?  ? ?GEN:  Well nourished, well developed in no acute distress ?HEENT: Normal ?NECK: No JVD; No carotid bruits ?LYMPHATICS: No lymphadenopathy ?CARDIAC: RRR, no murmurs,  no rubs, no gallops ?RESPIRATORY:  Clear to auscultation without rales, wheezing or rhonchi  ?ABDOMEN: Soft, non-tender, non-distended ?MUSCULOSKELETAL:  No edema; No deformity  ?SKIN: Warm and dry ?LOWER EXTREMITIES: no

## 2021-11-12 ENCOUNTER — Telehealth: Payer: Self-pay | Admitting: Cardiology

## 2021-11-12 MED ORDER — FUROSEMIDE 40 MG PO TABS: 40.0000 mg | ORAL_TABLET | Freq: Two times a day (BID) | ORAL | 1 refills | Status: DC

## 2021-11-12 NOTE — Telephone Encounter (Signed)
Rx sent, patient notified.  

## 2021-11-12 NOTE — Telephone Encounter (Signed)
? ?  Pt c/o medication issue: ? ?1. Name of Medication: furosemide (LASIX) 40 MG tablet ? ?2. How are you currently taking this medication (dosage and times per day)? Take 1 tablet (40 mg total) by mouth 2 (two) times daily. ? ?3. Are you having a reaction (difficulty breathing--STAT)?  ? ?4. What is your medication issue? Pt said Dr. Kirtland Bouchard reduced her lasix to 20 mg, she needs prescription send to [id number] - [place], [place] - [address] AT [facility] [address] today, she said she forgot to tell Dr. Kirtland Bouchard yesterday, she is out of meds ?

## 2021-11-19 ENCOUNTER — Ambulatory Visit (INDEPENDENT_AMBULATORY_CARE_PROVIDER_SITE_OTHER): Payer: Medicare Other

## 2021-11-19 ENCOUNTER — Other Ambulatory Visit: Payer: Self-pay

## 2021-11-19 ENCOUNTER — Encounter: Payer: Self-pay | Admitting: Family Medicine

## 2021-11-19 DIAGNOSIS — Z5181 Encounter for therapeutic drug level monitoring: Secondary | ICD-10-CM

## 2021-11-19 DIAGNOSIS — Z9229 Personal history of other drug therapy: Secondary | ICD-10-CM

## 2021-11-19 LAB — POCT INR: INR: 1.7 — AB (ref 2.0–3.0)

## 2021-11-19 NOTE — Patient Instructions (Signed)
Description   ?Take 1.5 tablets today and then Continue taking 1 tablet daily, except Wednesday take 1.5 tablets.  ?Be consistent with greens (2 servings per week)  ?Recheck in 4 weeks ?C[facility]madin Clinic [phone]  ?  ?   ?

## 2021-11-24 ENCOUNTER — Other Ambulatory Visit: Payer: Self-pay | Admitting: Family Medicine

## 2021-12-07 ENCOUNTER — Other Ambulatory Visit: Payer: Self-pay | Admitting: Cardiology

## 2021-12-07 DIAGNOSIS — Z9229 Personal history of other drug therapy: Secondary | ICD-10-CM

## 2021-12-14 ENCOUNTER — Other Ambulatory Visit: Payer: Self-pay | Admitting: Cardiology

## 2021-12-14 ENCOUNTER — Other Ambulatory Visit: Payer: Self-pay | Admitting: Family Medicine

## 2021-12-14 DIAGNOSIS — Z9229 Personal history of other drug therapy: Secondary | ICD-10-CM

## 2021-12-16 ENCOUNTER — Other Ambulatory Visit: Payer: Self-pay

## 2021-12-16 MED ORDER — GLUCOSE BLOOD VI STRP: ORAL_STRIP | 12 refills | Status: DC

## 2021-12-16 NOTE — Telephone Encounter (Signed)
Refill sent to pharmacy.   

## 2021-12-17 ENCOUNTER — Ambulatory Visit (INDEPENDENT_AMBULATORY_CARE_PROVIDER_SITE_OTHER): Payer: Medicare Other

## 2021-12-17 ENCOUNTER — Other Ambulatory Visit: Payer: Self-pay

## 2021-12-17 DIAGNOSIS — Z9229 Personal history of other drug therapy: Secondary | ICD-10-CM | POA: Diagnosis not present

## 2021-12-17 DIAGNOSIS — Z5181 Encounter for therapeutic drug level monitoring: Secondary | ICD-10-CM

## 2021-12-17 DIAGNOSIS — E1121 Type 2 diabetes mellitus with diabetic nephropathy: Secondary | ICD-10-CM

## 2021-12-17 LAB — POCT INR: INR: 1.6 — AB (ref 2.0–3.0)

## 2021-12-17 MED ORDER — GLUCOSE BLOOD VI STRP: ORAL_STRIP | 12 refills | Status: DC

## 2021-12-17 NOTE — Patient Instructions (Signed)
Description   ?Take 1.5 tablets today and then START taking 1 tablet daily, except 1.5 tablets on Wednesdays and Saturdays.  ?Be consistent with greens (2 servings per week)  ?Recheck in 2 weeks ?C[facility] Clinic [phone]  ?  ?   ?

## 2021-12-19 ENCOUNTER — Other Ambulatory Visit: Payer: Self-pay

## 2021-12-19 MED ORDER — ACCU-CHEK GUIDE VI STRP: ORAL_STRIP | 1 refills | Status: DC

## 2021-12-19 MED ORDER — CONTOUR NEXT TEST VI STRP: ORAL_STRIP | 2 refills | Status: DC

## 2021-12-19 MED ORDER — CONTOUR NEXT CONTROL LOW VI SOLN: 1.0000 [drp] | 1 refills | Status: DC

## 2021-12-19 MED ORDER — CONTOUR NEXT GEN MONITOR W/DEVICE KIT: 1.0000 | PACK | Freq: Two times a day (BID) | 0 refills | Status: DC

## 2021-12-23 ENCOUNTER — Other Ambulatory Visit: Payer: Self-pay

## 2021-12-23 ENCOUNTER — Other Ambulatory Visit: Payer: Self-pay | Admitting: Cardiology

## 2021-12-23 MED ORDER — SPIRONOLACTONE 25 MG PO TABS: 12.5000 mg | ORAL_TABLET | Freq: Every day | ORAL | 1 refills | Status: DC

## 2021-12-31 ENCOUNTER — Ambulatory Visit: Payer: Self-pay | Admitting: Family Medicine

## 2022-01-03 ENCOUNTER — Other Ambulatory Visit: Payer: Self-pay

## 2022-01-03 MED ORDER — MICROLET LANCETS MISC: 1.0000 | Freq: Two times a day (BID) | 2 refills | Status: DC

## 2022-01-03 MED ORDER — ACCU-CHEK FASTCLIX LANCETS MISC: 1.0000 | Freq: Two times a day (BID) | 0 refills | Status: DC

## 2022-01-07 ENCOUNTER — Ambulatory Visit (INDEPENDENT_AMBULATORY_CARE_PROVIDER_SITE_OTHER): Payer: Medicare Other

## 2022-01-07 DIAGNOSIS — Z9229 Personal history of other drug therapy: Secondary | ICD-10-CM | POA: Diagnosis not present

## 2022-01-07 DIAGNOSIS — Z5181 Encounter for therapeutic drug level monitoring: Secondary | ICD-10-CM | POA: Diagnosis not present

## 2022-01-07 LAB — POCT INR: INR: 2 (ref 2.0–3.0)

## 2022-01-07 NOTE — Patient Instructions (Signed)
Description   ?Take 1.5 tablets today and then continue taking 1 tablet daily, except 1.5 tablets on Wednesdays and Saturdays.  ?Be consistent with greens (2 servings per week)  ?Recheck in 3 weeks ?C[facility]madin Clinic [phone]  ?  ?   ?

## 2022-01-28 ENCOUNTER — Ambulatory Visit (INDEPENDENT_AMBULATORY_CARE_PROVIDER_SITE_OTHER): Payer: Medicare Other

## 2022-01-28 DIAGNOSIS — Z5181 Encounter for therapeutic drug level monitoring: Secondary | ICD-10-CM | POA: Diagnosis not present

## 2022-01-28 DIAGNOSIS — Z9229 Personal history of other drug therapy: Secondary | ICD-10-CM

## 2022-01-28 LAB — POCT INR: INR: 2.5 (ref 2.0–3.0)

## 2022-01-28 NOTE — Patient Instructions (Signed)
Description   Continue taking 1 tablet daily, except 1.5 tablets on Wednesdays and Saturdays.  Be consistent with greens (2 servings per week)  Recheck in 4 weeks [facility]in Clinic [phone]

## 2022-02-10 ENCOUNTER — Other Ambulatory Visit: Payer: Self-pay

## 2022-02-10 MED ORDER — DAPAGLIFLOZIN PROPANEDIOL 10 MG PO TABS: 10.0000 mg | ORAL_TABLET | Freq: Every day | ORAL | 2 refills | Status: DC

## 2022-02-21 ENCOUNTER — Other Ambulatory Visit: Payer: Self-pay | Admitting: Cardiology

## 2022-02-21 ENCOUNTER — Ambulatory Visit (INDEPENDENT_AMBULATORY_CARE_PROVIDER_SITE_OTHER): Payer: Medicare Other | Admitting: Family Medicine

## 2022-02-21 ENCOUNTER — Telehealth: Payer: Self-pay | Admitting: Cardiology

## 2022-02-21 VITALS — BP 84/60 | HR 68 | Temp 96.4°F | Resp 16 | Ht 67.0 in | Wt 151.0 lb

## 2022-02-21 DIAGNOSIS — I110 Hypertensive heart disease with heart failure: Secondary | ICD-10-CM | POA: Diagnosis not present

## 2022-02-21 DIAGNOSIS — I428 Other cardiomyopathies: Secondary | ICD-10-CM | POA: Diagnosis not present

## 2022-02-21 DIAGNOSIS — E1121 Type 2 diabetes mellitus with diabetic nephropathy: Secondary | ICD-10-CM

## 2022-02-21 DIAGNOSIS — E782 Mixed hyperlipidemia: Secondary | ICD-10-CM

## 2022-02-21 DIAGNOSIS — Z789 Other specified health status: Secondary | ICD-10-CM

## 2022-02-21 DIAGNOSIS — Z0001 Encounter for general adult medical examination with abnormal findings: Secondary | ICD-10-CM | POA: Diagnosis not present

## 2022-02-21 DIAGNOSIS — Z9229 Personal history of other drug therapy: Secondary | ICD-10-CM

## 2022-02-21 DIAGNOSIS — I5022 Chronic systolic (congestive) heart failure: Secondary | ICD-10-CM

## 2022-02-21 DIAGNOSIS — N1832 Chronic kidney disease, stage 3b: Secondary | ICD-10-CM

## 2022-02-21 DIAGNOSIS — Z7409 Other reduced mobility: Secondary | ICD-10-CM | POA: Diagnosis not present

## 2022-02-21 LAB — COMPREHENSIVE METABOLIC PANEL
ALT: 7 IU/L (ref 0–32)
AST: 21 IU/L (ref 0–40)
Albumin/Globulin Ratio: 0.9 — ABNORMAL LOW (ref 1.2–2.2)
Albumin: 4 g/dL (ref 3.8–4.8)
Alkaline Phosphatase: 97 IU/L (ref 44–121)
BUN/Creatinine Ratio: 24 (ref 12–28)
BUN: 34 mg/dL — ABNORMAL HIGH (ref 8–27)
Bilirubin Total: 1.2 mg/dL (ref 0.0–1.2)
CO2: 21 mmol/L (ref 20–29)
Calcium: 10 mg/dL (ref 8.7–10.3)
Chloride: 98 mmol/L (ref 96–106)
Creatinine, Ser: 1.4 mg/dL — ABNORMAL HIGH (ref 0.57–1.00)
Globulin, Total: 4.7 g/dL — ABNORMAL HIGH (ref 1.5–4.5)
Glucose: 116 mg/dL — ABNORMAL HIGH (ref 70–99)
Potassium: 5 mmol/L (ref 3.5–5.2)
Sodium: 134 mmol/L (ref 134–144)
Total Protein: 8.7 g/dL — ABNORMAL HIGH (ref 6.0–8.5)
eGFR: 41 mL/min/{1.73_m2} — ABNORMAL LOW (ref >59)

## 2022-02-21 LAB — CBC WITH DIFFERENTIAL/PLATELET
Basophils Absolute: 0.1 10*3/uL (ref 0.0–0.2)
Basos: 1 %
EOS (ABSOLUTE): 0.2 10*3/uL (ref 0.0–0.4)
Eos: 4 %
Hematocrit: 46.8 % — ABNORMAL HIGH (ref 34.0–46.6)
Hemoglobin: 15.7 g/dL (ref 11.1–15.9)
Immature Grans (Abs): 0 10*3/uL (ref 0.0–0.1)
Immature Granulocytes: 0 %
Lymphocytes Absolute: 1.9 10*3/uL (ref 0.7–3.1)
Lymphs: 30 %
MCH: 28.9 pg (ref 26.6–33.0)
MCHC: 33.5 g/dL (ref 31.5–35.7)
MCV: 86 fL (ref 79–97)
Monocytes Absolute: 0.6 10*3/uL (ref 0.1–0.9)
Monocytes: 9 %
Neutrophils Absolute: 3.5 10*3/uL (ref 1.4–7.0)
Neutrophils: 56 %
Platelets: 222 10*3/uL (ref 150–450)
RBC: 5.44 x10E6/uL — ABNORMAL HIGH (ref 3.77–5.28)
RDW: 14.6 % (ref 11.7–15.4)
WBC: 6.3 10*3/uL (ref 3.4–10.8)

## 2022-02-21 LAB — HEMOGLOBIN A1C
Est. average glucose Bld gHb Est-mCnc: 160 mg/dL
Hgb A1c MFr Bld: 7.2 % — ABNORMAL HIGH (ref 4.8–5.6)

## 2022-02-21 MED ORDER — FLUTICASONE PROPIONATE 50 MCG/ACT NA SUSP: 2.0000 | Freq: Every day | NASAL | 3 refills | Status: DC | PRN

## 2022-02-21 MED ORDER — ALBUTEROL SULFATE HFA 108 (90 BASE) MCG/ACT IN AERS: 2.0000 | INHALATION_SPRAY | RESPIRATORY_TRACT | 1 refills | Status: DC | PRN

## 2022-02-21 NOTE — Telephone Encounter (Signed)
Warfarin refill was sent on today at 1151am. Will not resend since this is a duplicate request. Called the pharmacy to ensure received and they confirmed it was received. Called pt per request and updated her that they have the warfarin refill and she was thankful.

## 2022-02-21 NOTE — Patient Instructions (Addendum)
Things to do to keep yourself healthy  - Exercise at least 15 minutes a day, 3-4 days a week.  - Eat a low-fat diet with lots of fruits and vegetables, up to 7-9 servings per day.  - Seatbelts can save your life. Wear them always.  - Smoke detectors on every level of your home, check batteries every year.  - Eye Doctor - have an eye exam every 1 year  - Alcohol -  If you drink, do it moderately, less than 2 drinks per day.  - Health Care Power of Attorney. Choose someone to speak for you if you are not able.  - Depression is common in our stressful world.If you're feeling down or losing interest in things you normally enjoy, please come in for a visit.  - Violence - If anyone is threatening or hurting you, please call immediately.   Get following vaccines at the pharmacy. Due for tetanus (dtap) shot.  Due for shingrix vaccine.

## 2022-02-21 NOTE — Telephone Encounter (Signed)
Pt needs warfarin called in. Best number [phone] Preferred Pharmacy [facility] on [address] in [place]

## 2022-02-21 NOTE — Progress Notes (Signed)
Subjective:  Patient ID: Breanna Bennett, female    DOB: July 15, 1956  Age: 66 y.o. MRN: [id number]  Chief Complaint  Patient presents with   Annual Exam    HPI Well Adult Physical: Patient here for a comprehensive physical exam.The patient reports  edema of legs.  Do you take any herbs or supplements that were not prescribed by a doctor? no Are you taking calcium supplements? yes Are you taking aspirin daily? no  Encounter for general adult medical examination without abnormal findings  Physical ("At Risk" items are starred): Patient's last physical exam was 1 year ago .  Patient is not afflicted from Stress Incontinence and Urge Incontinence  Patient wears a seat belt, has smoke detectors, has carbon monoxide detectors, practices appropriate gun safety (shot gun). Dental Care: biannual cleanings, brushes and flosses daily. Ophthalmology/Optometry: Annual visit.  Hearing loss: none Vision impairments: lost glasses. Has an appt with [facility].   Diabetes: Glucose checking: twice daily  Glucose logs: 90-130s Hypoglycemia:no Most recent A1C: 6.9 Current medications: Farxiga 10 mg daily ,  Last Eye Exam:  scheduled. Foot checks: Daily  Hyperlipidemia: Current medications: Atorvastatin 10 mg take 1 tablet daily.  Hypertension: Current medications:Carvedilol 3.125 mg 1 tablet twice daily, Aldactone 25 mg take 1/2 tablet daily, potassium 20 MeQ take 1 tablet TWICE DAILY, Lasix 40 once daily. Potassium chloride 20 meq one twice daily.   Hypothyroidism: Patient is taking Levothyroxine 50 mg 1 tablet daily.  Vitamin D deficiency: Patient is taking 50,000 mcg once weekly.  GERD: Famotidine 40 mg take 1 tablet at bed time,   [place] Visit from 12/13/2020 in [place]  PHQ-2 Total Score 0               Social Hx   Social History   Socioeconomic History   Marital status: Unknown    Spouse name: Not on file   Number of children: Not on file   Years  of education: Not on file   Highest education level: Not on file  Occupational History   Not on file  Tobacco Use   Smoking status: Never   Smokeless tobacco: Never  Vaping Use   Vaping Use: Never used  Substance and Sexual Activity   Alcohol use: Never   Drug use: Never   Sexual activity: Not on file  Other Topics Concern   Not on file  Social History Narrative   Not on file   Social Determinants of Health   Financial Resource Strain: High Risk (07/10/2021)   Overall Financial Resource Strain (CARDIA)    Difficulty of Paying Living Expenses: Very hard  Food Insecurity: Not on file  Transportation Needs: Not on file  Physical Activity: Not on file  Stress: Not on file  Social Connections: Not on file   Past Medical History:  Diagnosis Date   Acute CVA (cerebrovascular accident) ([place]) 05/30/2019   Acute CVA (cerebrovascular accident) ([place]) 05/30/2019   Acute respiratory failure ([place])    Acute systolic CHF (congestive heart failure) ([place]) 01/12/2019   Cardiac cachexia 123456   Chronic systolic CHF (congestive heart failure) ([place])    Class 2 severe obesity due to excess calories with serious comorbidity and body mass index (BMI) of 38.0 to 38.9 in adult ([place]) 10/19/2018   Cough due to bronchospasm 02/16/2019   Elevated hemoglobin (HCC)    Elevated uric acid in blood 06/06/2019   Essential hypertension 01/12/2019   Gastroesophageal reflux disease 10/19/2018   GERD (gastroesophageal reflux  disease)    History of Coumadin therapy 06/16/2019   History of CVA (cerebrovascular accident) 05/30/2019   History of gout 12/06/2019   History of hemiplegia 05/30/2019   Hospital discharge follow-up 06/24/2019   Hyperlipidemia    Hypertension    Hypokalemia 12/06/2019   Impaired mobility and ADLs 05/30/2019   Late effect of cerebrovascular accident (CVA) 12/12/2019   LBBB (left bundle branch block)    Left ventricular ejection fraction less than 20% 05/30/2019   Middle cerebral artery  embolism, 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   Mild renal insufficiency    Mitral regurgitation    NICM (nonischemic cardiomyopathy) (HCC)    Nonischemic cardiomyopathy (HCC) cardiac catheterization May 2020 showed normal coronaries, ejection fraction 30% 02/09/2019   Open wound 1 year ago   Bilateral legs   Perennial allergic rhinitis 02/16/2019   Peripheral edema 12/06/2019   Pre-diabetes    Stroke (cerebrum) (HCC) - R MCA infarct s/p mechanical thrombectomy 05/19/2019   Systolic CHF, acute (HCC) 02/09/2019   Tricuspid regurgitation    Type 2 diabetes mellitus with hyperglycemia, without long-term current use of insulin (HCC) 05/30/2019   Type 2 diabetes mellitus, uncontrolled 05/27/2019   Past Surgical History:  Procedure Laterality Date   IR ANGIO VERTEBRAL SEL SUBCLAVIAN INNOMINATE UNI R MOD SED  05/19/2019   IR CT HEAD LTD  05/19/2019   IR PERCUTANEOUS ART THROMBECTOMY/INFUSION INTRACRANIAL INC DIAG ANGIO  05/19/2019   RADIOLOGY WITH ANESTHESIA N/A 05/19/2019   Procedure: IR WITH ANESTHESIA;  Surgeon: Julieanne Cotton, MD;  Location: [facility] OR;  Service: Radiology;  Laterality: N/A;   RIGHT/LEFT HEART CATH AND CORONARY ANGIOGRAPHY N/A 01/13/2019   Procedure: RIGHT/LEFT HEART CATH AND CORONARY ANGIOGRAPHY;  Surgeon: Runell Gess, MD;  Location: [facility] INVASIVE CV LAB;  Service: Cardiovascular;  Laterality: N/A;   TUBAL LIGATION      Family History  Problem Relation Age of Onset   CAD Mother    Heart failure Mother    Hypertension Mother    Colon cancer Father    Cancer Brother     Review of Systems  Constitutional:  Negative for chills, fatigue and fever.  HENT:  Positive for congestion. Negative for rhinorrhea and sore throat.   Respiratory:  Negative for cough and shortness of breath.   Cardiovascular:  Positive for leg swelling. Negative for chest pain.  Gastrointestinal:  Negative for abdominal pain, constipation, diarrhea, nausea and vomiting.  Genitourinary:  Negative for dysuria and urgency.   Musculoskeletal:  Negative for back pain and myalgias.  Skin:        Skin lesions lower legs   Neurological:  Negative for dizziness, weakness, light-headedness and headaches.  Psychiatric/Behavioral:  Negative for dysphoric mood. The patient is not nervous/anxious.      Objective:  BP (!) 84/60   Pulse 68   Temp (!) 96.4 F (35.8 C)   Resp 16   Ht 5\' 7"  (1.702 m)   Wt 151 lb (68.5 kg)   BMI 23.65 kg/m      02/21/2022    8:52 AM 11/08/2021    4:23 PM 11/06/2021   11:19 AM  BP/Weight  Systolic BP 84 96 112  Diastolic BP 60 66 84  Wt. (Lbs) 151 163 155  BMI 23.65 kg/m2 25.53 kg/m2 24.28 kg/m2    Physical Exam Vitals reviewed.  Constitutional:      Appearance: Normal appearance. She is normal weight.  Neck:     Vascular: No carotid bruit.  Cardiovascular:     Rate  and Rhythm: Normal rate and regular rhythm.     Heart sounds: Normal heart sounds.  Pulmonary:     Effort: Pulmonary effort is normal. No respiratory distress.     Breath sounds: Normal breath sounds.  Abdominal:     General: Abdomen is flat. Bowel sounds are normal.     Palpations: Abdomen is soft.     Tenderness: There is no abdominal tenderness.  Skin:    Findings: Lesion (healing on BL legs. stasis dermatitis.) present.  Neurological:     Mental Status: She is alert and oriented to person, place, and time.  Psychiatric:        Mood and Affect: Mood normal.        Behavior: Behavior normal.     Lab Results  Component Value Date   WBC 7.4 11/06/2021   HGB 14.9 11/06/2021   HCT 45.7 11/06/2021   PLT 212 11/06/2021   GLUCOSE 92 11/06/2021   CHOL 124 11/06/2021   TRIG 55 11/06/2021   HDL 33 (L) 11/06/2021   LDLCALC 79 11/06/2021   ALT 4 11/06/2021   AST 16 11/06/2021   NA 138 11/06/2021   K 5.4 (H) 11/06/2021   CL 104 11/06/2021   CREATININE 0.90 11/06/2021   BUN 27 11/06/2021   CO2 22 11/06/2021   TSH 1.430 11/06/2021   INR 2.5 01/28/2022   HGBA1C 6.9 (H) 11/06/2021   MICROALBUR 150  12/13/2020      Assessment & Plan:   Problem List Items Addressed This Visit   None     Body mass index is 23.65 kg/m.   These are the goals we discussed:  Goals      Manage My Medicine     Timeframe:  Long-Range Goal Priority:  High Start Date:                             Expected End Date:                       Follow Up Date 07/10/22   - call for medicine refill 2 or 3 days before it runs out    Why is this important?   These steps will help you keep on track with your medicines.   Notes:      Set My Target A1C-Diabetes Type 2     Timeframe:  Long-Range Goal Priority:  High Start Date:                             Expected End Date:                       Follow Up Date 07/10/22   - set target A1C    Why is this important?   Your target A1C is decided together by you and your doctor.  It is based on several things like your age and other health issues.    Notes:          This is a list of the screening recommended for you and due dates:  Health Maintenance  Topic Date Due   COVID-19 Vaccine (1) Never done   Eye exam for diabetics  Never done   Hepatitis C Screening: USPSTF Recommendation to screen - Ages 33-79 yo.  Never done   Zoster (Shingles) Vaccine (1 of 2) Never done   Mammogram  08/11/2020   Tetanus Vaccine  01/16/2021   DEXA scan (bone density measurement)  Never done   Flu Shot  03/25/2022   Hemoglobin A1C  05/09/2022   Complete foot exam   07/16/2022   Urine Protein Check  11/07/2022   Cologuard (Stool DNA test)  01/17/2024   Pneumonia Vaccine  Completed   HPV Vaccine  Aged Out     No orders of the defined types were placed in this encounter.   Follow-up: No follow-ups on file.  An After Visit Summary was printed and given to the patient.  Blane Ohara, MD [facility] [phone]

## 2022-02-24 ENCOUNTER — Other Ambulatory Visit: Payer: Self-pay

## 2022-02-24 MED ORDER — KERENDIA 10 MG PO TABS: 10.0000 mg | ORAL_TABLET | Freq: Every day | ORAL | 2 refills | Status: DC

## 2022-02-26 ENCOUNTER — Encounter: Payer: Self-pay | Admitting: Family Medicine

## 2022-02-26 DIAGNOSIS — Z0001 Encounter for general adult medical examination with abnormal findings: Secondary | ICD-10-CM | POA: Insufficient documentation

## 2022-02-26 DIAGNOSIS — N1832 Chronic kidney disease, stage 3b: Secondary | ICD-10-CM | POA: Insufficient documentation

## 2022-02-26 NOTE — Assessment & Plan Note (Signed)
Stable.  Management per cardiology. Continue:Carvedilol 3.125 mg 1 tablet twice daily, Aldactone 25 mg take 1/2 tablet daily, potassium 20 MeQ take 1 tablet TWICE DAILY, Lasix 40 once daily.

## 2022-02-26 NOTE — Assessment & Plan Note (Signed)
Things to do to keep yourself healthy  - Exercise at least 15 minutes a day, 3-4 days a week.  - Eat a low-fat diet with lots of fruits and vegetables, up to 7-9 servings per day.  - Seatbelts can save your life. Wear them always.  - Smoke detectors on every level of your home, check batteries every year.  - Eye Doctor - have an eye exam every 1 year  - Alcohol -  If you drink, do it moderately, less than 2 drinks per day.  - Health Care Power of Attorney. Choose someone to speak for you if you are not able.  - Depression is common in our stressful world.If you're feeling down or losing interest in things you normally enjoy, please come in for a visit.  - Violence - If anyone is threatening or hurting you, please call immediately.   Get following vaccines at the pharmacy. Due for tetanus (dtap) shot.  Due for shingrix vaccine. 

## 2022-02-26 NOTE — Assessment & Plan Note (Signed)
Stable.  Continue:Carvedilol 3.125 mg 1 tablet twice daily, Aldactone 25 mg take 1/2 tablet daily, potassium 20 MeQ take 1 tablet TWICE DAILY, Lasix 40 once dailY.

## 2022-02-26 NOTE — Assessment & Plan Note (Signed)
Stable.  I reviewed the patient's chart after she left and it does not appear she see nephrology.  We will recommend referral.

## 2022-02-26 NOTE — Assessment & Plan Note (Signed)
Patient has assistance from family.

## 2022-02-26 NOTE — Assessment & Plan Note (Signed)
Control: Good. Recommend check sugars fasting daily. Recommend check feet daily. Recommend annual eye exams. Medicines: No changes to medications. Continue to work on eating a healthy diet and exercise.  Labs drawn today.

## 2022-03-04 ENCOUNTER — Ambulatory Visit (INDEPENDENT_AMBULATORY_CARE_PROVIDER_SITE_OTHER): Payer: Medicare Other

## 2022-03-04 DIAGNOSIS — Z5181 Encounter for therapeutic drug level monitoring: Secondary | ICD-10-CM | POA: Diagnosis not present

## 2022-03-04 DIAGNOSIS — Z9229 Personal history of other drug therapy: Secondary | ICD-10-CM

## 2022-03-04 LAB — POCT INR: INR: 2.2 (ref 2.0–3.0)

## 2022-03-04 NOTE — Patient Instructions (Signed)
Description   Continue taking 1 tablet daily, except 1.5 tablets on Wednesdays and Saturdays.  Be consistent with greens (2 servings per week)  Recheck in 5 [facility] [phone]

## 2022-03-06 LAB — HM DIABETES EYE EXAM

## 2022-03-11 ENCOUNTER — Encounter: Payer: Self-pay | Admitting: Family Medicine

## 2022-04-01 ENCOUNTER — Telehealth: Payer: Self-pay

## 2022-04-01 NOTE — Telephone Encounter (Signed)
Gabriel Cirri, patient's daughter, calling as patient has hit donut hole. Patient's [place] are too expensive. Please advise.   Did not have samples when on phone with daughter.  Lorita Officer, [place] 04/01/22 3:14 PM

## 2022-04-02 ENCOUNTER — Other Ambulatory Visit: Payer: Self-pay

## 2022-04-02 MED ORDER — DAPAGLIFLOZIN PROPANEDIOL 10 MG PO TABS: 10.0000 mg | ORAL_TABLET | Freq: Every day | ORAL | 0 refills | Status: DC

## 2022-04-02 NOTE — Telephone Encounter (Signed)
Patient's daughter called back to check today to see if we had come up with anything yet about the medications. I went and looked in the sample closet and was able to obtain 21 days worth of the [place] and found a coupon to go with a 30 day RX to patient's pharmacy to get her 30 days free of the [place] until something can be figured out as to what patient needs to do next. Please Advise.

## 2022-04-02 NOTE — Telephone Encounter (Signed)
Patient is currently in the donut hole for the medication and can't afford it. Getting patient a printed rx for 30days and giving her a coupon to give to pharmacist to go with rx to give her a 30 day free rx of [place].

## 2022-04-03 ENCOUNTER — Other Ambulatory Visit: Payer: Self-pay | Admitting: Family Medicine

## 2022-04-03 ENCOUNTER — Other Ambulatory Visit: Payer: Self-pay

## 2022-04-03 MED ORDER — FAMOTIDINE 40 MG PO TABS: ORAL_TABLET | ORAL | 1 refills | Status: DC

## 2022-04-04 NOTE — Telephone Encounter (Signed)
Attempted to call Gabriel Cirri, no answer and unable to leave VM.  Lorita Officer, [facility] 04/04/22 10:19 AM

## 2022-04-07 ENCOUNTER — Other Ambulatory Visit: Payer: Self-pay

## 2022-04-07 DIAGNOSIS — I5022 Chronic systolic (congestive) heart failure: Secondary | ICD-10-CM

## 2022-04-07 DIAGNOSIS — E1121 Type 2 diabetes mellitus with diabetic nephropathy: Secondary | ICD-10-CM

## 2022-04-07 DIAGNOSIS — I110 Hypertensive heart disease with heart failure: Secondary | ICD-10-CM

## 2022-04-07 NOTE — Telephone Encounter (Signed)
Patient daughter made aware, Verbalized Understanding. Referral sent in

## 2022-04-07 NOTE — Progress Notes (Signed)
Referral sent for CCM

## 2022-04-08 ENCOUNTER — Ambulatory Visit (INDEPENDENT_AMBULATORY_CARE_PROVIDER_SITE_OTHER): Payer: Medicare Other

## 2022-04-08 DIAGNOSIS — Z5181 Encounter for therapeutic drug level monitoring: Secondary | ICD-10-CM

## 2022-04-08 DIAGNOSIS — Z9229 Personal history of other drug therapy: Secondary | ICD-10-CM | POA: Diagnosis not present

## 2022-04-08 LAB — POCT INR: INR: 2.8 (ref 2.0–3.0)

## 2022-04-08 NOTE — Patient Instructions (Signed)
Description   Continue taking 1 tablet daily, except 1.5 tablets on Wednesdays and Saturdays.  Be consistent with greens (2 servings per week)  Recheck in 6 weeks [facility] #[phone]       

## 2022-04-14 ENCOUNTER — Ambulatory Visit: Payer: Medicare Other | Admitting: Cardiology

## 2022-04-16 ENCOUNTER — Encounter: Payer: Self-pay | Admitting: Cardiology

## 2022-04-16 ENCOUNTER — Other Ambulatory Visit: Payer: Self-pay

## 2022-04-16 ENCOUNTER — Ambulatory Visit (INDEPENDENT_AMBULATORY_CARE_PROVIDER_SITE_OTHER): Payer: Medicare Other | Admitting: Cardiology

## 2022-04-16 VITALS — BP 98/58 | HR 65 | Ht 68.0 in | Wt 157.6 lb

## 2022-04-16 DIAGNOSIS — N1832 Chronic kidney disease, stage 3b: Secondary | ICD-10-CM

## 2022-04-16 DIAGNOSIS — R0609 Other forms of dyspnea: Secondary | ICD-10-CM

## 2022-04-16 DIAGNOSIS — I447 Left bundle-branch block, unspecified: Secondary | ICD-10-CM | POA: Diagnosis not present

## 2022-04-16 DIAGNOSIS — I340 Nonrheumatic mitral (valve) insufficiency: Secondary | ICD-10-CM | POA: Diagnosis not present

## 2022-04-16 DIAGNOSIS — I428 Other cardiomyopathies: Secondary | ICD-10-CM

## 2022-04-16 DIAGNOSIS — I5022 Chronic systolic (congestive) heart failure: Secondary | ICD-10-CM

## 2022-04-16 DIAGNOSIS — E1165 Type 2 diabetes mellitus with hyperglycemia: Secondary | ICD-10-CM

## 2022-04-16 MED ORDER — CONTOUR NEXT TEST VI STRP: ORAL_STRIP | 2 refills | Status: DC

## 2022-04-16 MED ORDER — MICROLET LANCETS MISC: 1.0000 | Freq: Two times a day (BID) | 2 refills | Status: DC

## 2022-04-16 NOTE — Progress Notes (Unsigned)
Cardiology Office Note:    Date:  04/16/2022   ID:  Staci Dack, DOB 12-Oct-1955, MRN [id number]  PCP:  Rochel Brome, MD  Cardiologist:  Jenne Campus, MD    Referring MD: Rochel Brome, MD   Chief Complaint  Patient presents with   Follow-up   Medication Refill    All cardiac meds     History of Present Illness:    Breanna Bennett is a 66 y.o. female with past medical history significant for nonischemic cardiomyopathy ejection fraction 20 to 25%, chronic left bundle branch block, dyslipidemia, essential hypertension, history of CVA, congestive heart failure and she is in [place] class III.  However, she refused any aggressive measurements.  She does not want to have ICD implanted she does not want to have BiV pacing.  Comes today to my office for follow-up.  Overall she seems to be doing well.  Describes however the fact that her legs are leaking.  She is wearing elastic stockings.  Denies have any shortness of breath chest pain tightness squeezing pressure burning chest no palpitations  Past Medical History:  Diagnosis Date   Acute CVA (cerebrovascular accident) ([place]) 05/30/2019   Acute CVA (cerebrovascular accident) ([place]) 05/30/2019   Acute respiratory failure ([place])    Acute systolic CHF (congestive heart failure) ([place]) 01/12/2019   Cardiac cachexia 17/91/5056   Chronic systolic CHF (congestive heart failure) (HCC)    Class 2 severe obesity due to excess calories with serious comorbidity and body mass index (BMI) of 38.0 to 38.9 in adult ([place]) 10/19/2018   Cough due to bronchospasm 02/16/2019   Elevated hemoglobin (HCC)    Elevated uric acid in blood 06/06/2019   Essential hypertension 01/12/2019   Gastroesophageal reflux disease 10/19/2018   GERD (gastroesophageal reflux disease)    History of Coumadin therapy 06/16/2019   History of CVA (cerebrovascular accident) 05/30/2019   History of gout 12/06/2019   History of hemiplegia 05/30/2019   Hospital discharge  follow-up 06/24/2019   Hyperlipidemia    Hypertension    Hypokalemia 12/06/2019   Impaired mobility and ADLs 05/30/2019   Late effect of cerebrovascular accident (CVA) 12/12/2019   LBBB (left bundle branch block)    Left ventricular ejection fraction less than 20% 05/30/2019   Middle cerebral artery embolism, 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   Mild renal insufficiency    Mitral regurgitation    NICM (nonischemic cardiomyopathy) ([place])    Nonischemic cardiomyopathy (HCC) cardiac catheterization May 2020 showed normal coronaries, ejection fraction 30% 02/09/2019   Open wound 1 year ago   Bilateral legs   Perennial allergic rhinitis 02/16/2019   Peripheral edema 12/06/2019   Pre-diabetes    Stroke (cerebrum) (HCC) - R MCA infarct s/p mechanical thrombectomy 9/79/4801   Systolic CHF, acute ([place]) 02/09/2019   Tricuspid regurgitation    Type 2 diabetes mellitus with hyperglycemia, without long-term current use of insulin ([place]) 05/30/2019   Type 2 diabetes mellitus, uncontrolled 05/27/2019    Past Surgical History:  Procedure Laterality Date   IR ANGIO VERTEBRAL SEL SUBCLAVIAN INNOMINATE UNI R MOD SED  05/19/2019   IR CT HEAD LTD  05/19/2019   IR PERCUTANEOUS ART THROMBECTOMY/INFUSION INTRACRANIAL INC DIAG ANGIO  05/19/2019   RADIOLOGY WITH ANESTHESIA N/A 05/19/2019   Procedure: IR WITH ANESTHESIA;  Surgeon: Luanne Bras, MD;  Location: [facility];  Service: Radiology;  Laterality: N/A;   RIGHT/LEFT HEART CATH AND CORONARY ANGIOGRAPHY N/A 01/13/2019   Procedure: RIGHT/LEFT HEART CATH AND CORONARY ANGIOGRAPHY;  Surgeon: Lorretta Harp,  MD;  Location: [facility];  Service: Cardiovascular;  Laterality: N/A;   TUBAL LIGATION      Current Medications: Current Meds  Medication Sig   albuterol (PROAIR HFA) 108 (90 Base) MCG/ACT inhaler Inhale 2 puffs into the lungs every 4 (four) hours as needed for shortness of breath or wheezing.   allopurinol (ZYLOPRIM) 100 MG tablet TAKE 1 TABLET(100 MG) BY MOUTH TWICE  DAILY (Patient taking differently: Take 100 mg by mouth 2 (two) times daily.)   atorvastatin (LIPITOR) 10 MG tablet Take 1 tablet (10 mg total) by mouth daily.   azelastine (OPTIVAR) 0.05 % ophthalmic solution Place 1 drop into both eyes 2 (two) times daily.   Blood Glucose Calibration (CONTOUR NEXT CONTROL) Low SOLN 1 drop by In Vitro route every 30 (thirty) days.   Blood Glucose Monitoring Suppl (CONTOUR NEXT GEN MONITOR) w/Device KIT 1 each by Does not apply route 2 (two) times daily.   carvedilol (COREG) 3.125 MG tablet Take 1 tablet (3.125 mg total) by mouth 2 (two) times daily with a meal.   dapagliflozin propanediol (FARXIGA) 10 MG TABS tablet Take 1 tablet (10 mg total) by mouth daily before breakfast.   famotidine (PEPCID) 40 MG tablet TAKE 1 TABLET(40 MG) BY MOUTH AT BEDTIME (Patient taking differently: Take 40 mg by mouth at bedtime. TAKE 1 TABLET(40 MG) BY MOUTH AT BEDTIME)   Finerenone (KERENDIA) 10 MG TABS Take 10 mg by mouth daily.   fluticasone (FLONASE) 50 MCG/ACT nasal spray Place 2 sprays into both nostrils daily as needed for allergies or rhinitis.   furosemide (LASIX) 40 MG tablet Take 1 tablet (40 mg total) by mouth 2 (two) times daily.   glucose blood (CONTOUR NEXT TEST) test strip Use as instructed (Patient taking differently: 1 each by Other route as needed for other (Glucose). Use as instructed)   hypromellose (GENTEAL SEVERE) 0.3 % GEL ophthalmic ointment Place into both eyes every 6 (six) hours as needed for dry eyes. (Patient taking differently: Place 1 Application into both eyes every 6 (six) hours as needed for dry eyes.)   levothyroxine (SYNTHROID) 50 MCG tablet TAKE 1 TABLET(50 MCG) BY MOUTH DAILY BEFORE BREAKFAST (Patient taking differently: Take 50 mcg by mouth daily before breakfast.)   Microlet Lancets MISC 1 each by Does not apply route in the morning and at bedtime.   montelukast (SINGULAIR) 10 MG tablet TAKE 1 TABLET(10 MG) BY MOUTH AT BEDTIME (Patient taking  differently: Take 10 mg by mouth at bedtime.)   olopatadine (PATANOL) 0.1 % ophthalmic solution Place 1 drop into both eyes 2 (two) times daily.   Potassium Chloride ER 20 MEQ TBCR TAKE 1 TABLET(20 MEQ) BY MOUTH TWICE DAILY (Patient taking differently: Take 1 tablet by mouth 2 (two) times daily. TAKE 1 TABLET(20 MEQ) BY MOUTH TWICE DAILY)   spironolactone (ALDACTONE) 25 MG tablet Take 0.5 tablets (12.5 mg total) by mouth daily.   Vitamin D, Ergocalciferol, (DRISDOL) 1.25 MG (50000 UNIT) CAPS capsule Take 1 capsule (50,000 Units total) by mouth every Monday.   warfarin (COUMADIN) 2.5 MG tablet TAKE 1/2 TO 1 TABLET BY MOUTH DAILY AS DIRECTED BY COUMADIN CLINIC (Patient taking differently: Take 1.25-2.5 mg by mouth one time only at 4 PM. TAKE 1/2 TO 1 TABLET BY MOUTH DAILY AS DIRECTED BY COUMADIN CLINIC)     Allergies:   Lisinopril and Losartan   Social History   Socioeconomic History   Marital status: Unknown    Spouse name: Not on file  Number of children: Not on file   Years of education: Not on file   Highest education level: Not on file  Occupational History   Not on file  Tobacco Use   Smoking status: Never   Smokeless tobacco: Never  Vaping Use   Vaping Use: Never used  Substance and Sexual Activity   Alcohol use: Never   Drug use: Never   Sexual activity: Not on file  Other Topics Concern   Not on file  Social History Narrative   Not on file   Social Determinants of Health   Financial Resource Strain: Medium Risk (02/21/2022)   Overall Financial Resource Strain (CARDIA)    Difficulty of Paying Living Expenses: Somewhat hard  Food Insecurity: Food Insecurity Present (02/21/2022)   Hunger Vital Sign    Worried About Running Out of Food in the Last Year: Sometimes true    Ran Out of Food in the Last Year: Sometimes true  Transportation Needs: No Transportation Needs (02/21/2022)   PRAPARE - Hydrologist (Medical): No    Lack of Transportation  (Non-Medical): No  Physical Activity: Sufficiently Active (02/21/2022)   Exercise Vital Sign    Days of Exercise per Week: 7 days    Minutes of Exercise per Session: 30 min  Stress: No Stress Concern Present (02/21/2022)   [place]    Feeling of Stress : Not at all  Social Connections: Moderately Isolated (02/21/2022)   Social Connection and Isolation Panel [NHANES]    Frequency of Communication with Friends and Family: Once a week    Frequency of Social Gatherings with Friends and Family: Three times a week    Attends Religious Services: Never    Active Member of Clubs or Organizations: No    Attends Archivist Meetings: Never    Marital Status: Married     Family History: The patient's family history includes CAD in her mother; Cancer in her brother; Colon cancer in her father; Heart failure in her mother; Hypertension in her mother. ROS:   Please see the history of present illness.    All 14 point review of systems negative except as described per history of present illness  EKGs/Labs/Other Studies Reviewed:      Recent Labs: 07/08/2021: NT-Pro BNP 3,562 11/06/2021: TSH 1.430 02/21/2022: ALT 7; BUN 34; Creatinine, Ser 1.40; Hemoglobin 15.7; Platelets 222; Potassium 5.0; Sodium 134  Recent Lipid Panel    Component Value Date/Time   CHOL 124 11/06/2021 1151   TRIG 55 11/06/2021 1151   HDL 33 (L) 11/06/2021 1151   CHOLHDL 3.8 11/06/2021 1151   CHOLHDL 6.4 05/20/2019 0441   VLDL 18 05/20/2019 0441   LDLCALC 79 11/06/2021 1151    Physical Exam:    VS:  BP (!) 98/58 (BP Location: Left Arm, Patient Position: Sitting)   Pulse 65   Ht '5\' 8"'  (1.727 m)   Wt 157 lb 9.6 oz (71.5 kg)   SpO2 94%   BMI 23.96 kg/m     Wt Readings from Last 3 Encounters:  04/16/22 157 lb 9.6 oz (71.5 kg)  02/21/22 151 lb (68.5 kg)  11/08/21 163 lb (73.9 kg)     GEN:  Well nourished, well developed in no acute  distress HEENT: Normal NECK: No JVD; No carotid bruits LYMPHATICS: No lymphadenopathy CARDIAC: RRR, I will assist with murmur grade 2/6 to 3/6 best heard left border of sternum, no rubs, no gallops RESPIRATORY:  Clear to auscultation without rales, wheezing or rhonchi  ABDOMEN: Soft, non-tender, non-distended MUSCULOSKELETAL:  No edema; No deformity  SKIN: Warm and dry LOWER EXTREMITIES: Minimal swelling but she wears elastic stockings NEUROLOGIC:  Alert and oriented x 3 PSYCHIATRIC:  Normal affect   ASSESSMENT:    1. Nonischemic cardiomyopathy [facility]) cardiac catheterization May 2020 showed normal coronaries, ejection fraction 30%   2. Chronic systolic CHF (congestive heart failure) ([place])   3. LBBB (left bundle branch block)   4. Nonrheumatic mitral valve regurgitation   5. Type 2 diabetes mellitus with hyperglycemia, without long-term current use of insulin (HCC)   6. Stage 3b chronic kidney disease (HCC)    PLAN:    In order of problems listed above:  Nonischemic cardiomyopathy with severely reduced left ventricle ejection fraction.  Patient does not want any aggressive measurements for it.  I will do proBNP complete metabolic panel today to see if there is any room for additional dose of diuretics which I am afraid I doubt because of low blood pressure.  Overall she appears to be doing quite well considering her situation Chronic congestive heart failure, annual cardiac cessation class III Left bundle branch block.  Noted. Nonrheumatic mitral valve regurgitation, stable. Type 2 diabetes that has been followed by entire medicine team.  I do see hemoglobin A1c of 7.2 this is from June of this year need to have better diabetic control Dyslipidemia I did review K PN which show me LDL 79 HDL 33 we will continue present management   Medication Adjustments/Labs and Tests Ordered: Current medicines are reviewed at length with the patient today.  Concerns regarding medicines are outlined  above.  No orders of the defined types were placed in this encounter.  Medication changes: No orders of the defined types were placed in this encounter.   Signed, Park Liter, MD, [facility] 04/16/2022 1:42 PM    [place]

## 2022-04-16 NOTE — Patient Instructions (Signed)
Medication Instructions:  Your physician recommends that you continue on your current medications as directed. Please refer to the Current Medication list given to you today.  *If you need a refill on your cardiac medications before your next appointment, please call your pharmacy*   Lab Work: BMP, ProBNP- Today If you have labs (blood work) drawn today and your tests are completely normal, you will receive your results only by: MyChart Message (if you have MyChart) OR A paper copy in the mail If you have any lab test that is abnormal or we need to change your treatment, we will call you to review the results.   Testing/Procedures: None Ordered   Follow-Up: At [facility], you and your health needs are our priority.  As part of our continuing mission to provide you with exceptional heart care, we have created designated Provider Care Teams.  These Care Teams include your primary Cardiologist (physician) and Advanced Practice Providers (APPs -  Physician Assistants and Nurse Practitioners) who all work together to provide you with the care you need, when you need it.  We recommend signing up for the patient portal called "MyChart".  Sign up information is provided on this After Visit Summary.  MyChart is used to connect with patients for Virtual Visits (Telemedicine).  Patients are able to view lab/test results, encounter notes, upcoming appointments, etc.  Non-urgent messages can be sent to your provider as well.   To learn more about what you can do with MyChart, go to [email or web address].    Your next appointment:   6 month(s)  The format for your next appointment:   In Person  Provider:   Robert Krasowski, MD    Other Instructions NA  

## 2022-04-17 LAB — BASIC METABOLIC PANEL
BUN/Creatinine Ratio: 27 (ref 12–28)
BUN: 33 mg/dL — ABNORMAL HIGH (ref 8–27)
CO2: 20 mmol/L (ref 20–29)
Calcium: 9.5 mg/dL (ref 8.7–10.3)
Chloride: 101 mmol/L (ref 96–106)
Creatinine, Ser: 1.22 mg/dL — ABNORMAL HIGH (ref 0.57–1.00)
Glucose: 119 mg/dL — ABNORMAL HIGH (ref 70–99)
Potassium: 4.1 mmol/L (ref 3.5–5.2)
Sodium: 136 mmol/L (ref 134–144)
eGFR: 49 mL/min/{1.73_m2} — ABNORMAL LOW (ref >59)

## 2022-04-17 LAB — PRO B NATRIURETIC PEPTIDE: NT-Pro BNP: 8412 pg/mL — ABNORMAL HIGH (ref 0–301)

## 2022-04-18 MED ORDER — FUROSEMIDE 40 MG PO TABS: ORAL_TABLET | ORAL | 2 refills | Status: DC

## 2022-04-18 NOTE — Addendum Note (Signed)
Addended by: Eleonore Chiquito on: 04/18/2022 04:45 PM   Modules accepted: Orders

## 2022-04-21 ENCOUNTER — Telehealth: Payer: Self-pay

## 2022-04-21 NOTE — Telephone Encounter (Signed)
Pt's daughter came by and states that the pt has only been taking 40 mg Lasix daily. Advised to start 40 mg BID. New instructions were 60 mg Lasix in the am and 40 mg in the pm but it looked like the pt was already taking 40 mg in am and 40 mg in pm. Please advise how she should take the medication now that we have her correct dose of Lasix 40 mg daily.

## 2022-04-22 ENCOUNTER — Other Ambulatory Visit: Payer: Self-pay

## 2022-04-22 DIAGNOSIS — E1121 Type 2 diabetes mellitus with diabetic nephropathy: Secondary | ICD-10-CM

## 2022-04-22 MED ORDER — FUROSEMIDE 40 MG PO TABS: ORAL_TABLET | ORAL | 2 refills | Status: DC

## 2022-04-22 MED ORDER — CONTOUR NEXT TEST VI STRP: ORAL_STRIP | 2 refills | Status: DC

## 2022-04-22 NOTE — Addendum Note (Signed)
Addended by: Eleonore Chiquito on: 04/22/2022 12:40 PM   Modules accepted: Orders

## 2022-05-02 ENCOUNTER — Other Ambulatory Visit: Payer: Self-pay | Admitting: Cardiology

## 2022-05-02 DIAGNOSIS — Z9229 Personal history of other drug therapy: Secondary | ICD-10-CM

## 2022-05-19 ENCOUNTER — Telehealth: Payer: Self-pay

## 2022-05-19 ENCOUNTER — Other Ambulatory Visit: Payer: Self-pay | Admitting: Family Medicine

## 2022-05-19 MED ORDER — KERENDIA 20 MG PO TABS: 10.0000 mg | ORAL_TABLET | Freq: Every day | ORAL | 0 refills | Status: DC

## 2022-05-19 NOTE — Telephone Encounter (Signed)
Called and left daughter Denman George) message to call office back about samples that the patient needs.

## 2022-05-20 ENCOUNTER — Telehealth: Payer: Self-pay

## 2022-05-20 ENCOUNTER — Ambulatory Visit: Payer: Medicare Other | Attending: Cardiology

## 2022-05-20 DIAGNOSIS — Z9229 Personal history of other drug therapy: Secondary | ICD-10-CM | POA: Diagnosis not present

## 2022-05-20 DIAGNOSIS — Z5181 Encounter for therapeutic drug level monitoring: Secondary | ICD-10-CM | POA: Diagnosis not present

## 2022-05-20 LAB — POCT INR: INR: 2 (ref 2.0–3.0)

## 2022-05-20 NOTE — Telephone Encounter (Signed)
I called the home number and the daughter's number and was unable to leave a message at this time.

## 2022-05-20 NOTE — Patient Instructions (Signed)
Description   Take 1.5 tablets today and then continue taking 1 tablet daily, except 1.5 tablets on Wednesdays and Saturdays.  Be consistent with greens (2 servings per week)  Recheck in 6 weeks Coumadin Clinic [phone]

## 2022-05-20 NOTE — Telephone Encounter (Signed)
-----   Message from Kirsten Cox, MD sent at 05/19/2022 11:05 AM EDT ----- Regarding: stop spironolactone. Please call patient's daughter and have her stop the spironolactone. I do not want her taking spironolactone with kerendia. Dr. Cox   

## 2022-05-28 ENCOUNTER — Other Ambulatory Visit: Payer: Self-pay

## 2022-05-28 MED ORDER — LORATADINE 10 MG PO TABS: 10.0000 mg | ORAL_TABLET | Freq: Every day | ORAL | 1 refills | Status: DC

## 2022-06-04 ENCOUNTER — Telehealth: Payer: Self-pay

## 2022-06-04 NOTE — Telephone Encounter (Signed)
Daughter was notified that patient should not be taking the spironlactone and [place].  She should currently be on the [place].

## 2022-06-04 NOTE — Telephone Encounter (Signed)
-----   Message from Rochel Brome, MD sent at 05/19/2022 11:05 AM EDT ----- Regarding: stop spironolactone. Please call patient's daughter and have her stop the spironolactone. I do not want her taking spironolactone with kerendia. Dr. Tobie Poet

## 2022-06-07 ENCOUNTER — Other Ambulatory Visit: Payer: Self-pay | Admitting: Cardiology

## 2022-06-20 ENCOUNTER — Other Ambulatory Visit: Payer: Self-pay | Admitting: Family Medicine

## 2022-06-28 ENCOUNTER — Other Ambulatory Visit: Payer: Self-pay | Admitting: Family Medicine

## 2022-06-30 ENCOUNTER — Other Ambulatory Visit: Payer: Self-pay | Admitting: Family Medicine

## 2022-06-30 ENCOUNTER — Telehealth: Payer: Self-pay | Admitting: Cardiology

## 2022-06-30 MED ORDER — DAPAGLIFLOZIN PROPANEDIOL 10 MG PO TABS: 10.0000 mg | ORAL_TABLET | Freq: Every day | ORAL | 0 refills | Status: DC

## 2022-06-30 MED ORDER — KERENDIA 20 MG PO TABS: 10.0000 mg | ORAL_TABLET | Freq: Every day | ORAL | 0 refills | Status: DC

## 2022-06-30 MED ORDER — FUROSEMIDE 40 MG PO TABS: ORAL_TABLET | ORAL | 2 refills | Status: DC

## 2022-06-30 MED ORDER — CONTOUR NEXT GEN MONITOR W/DEVICE KIT: 1.0000 | PACK | Freq: Two times a day (BID) | 0 refills | Status: DC

## 2022-06-30 MED ORDER — CARVEDILOL 3.125 MG PO TABS: 3.1250 mg | ORAL_TABLET | Freq: Two times a day (BID) | ORAL | 2 refills | Status: DC

## 2022-06-30 NOTE — Telephone Encounter (Signed)
*  STAT* If patient is at the pharmacy, call can be transferred to refill team.   1. Which medications need to be refilled? (please list name of each medication and dose if known)   carvedilol (COREG) 3.125 MG tablet  allopurinol (ZYLOPRIM) 100 MG tablet  Finnerenone (KERENDIA) 10 MG TABS  montelukast (SINGULAIR) 10 MG tablet  warfarin (COUMADIN) 2.5 MG tablet  famotidine (PEPCID) 40 MG tablet  dapagliflozin propanediol (FARXIGA) 10 MG TABS tablet  levothyroxine (SYNTHROID) 50 MCG tablet  atorvastatin (LIPITOR) 10 MG tablet  Vitamin D, Ergocalciferol, (DRISDOL) 1.25 MG (50000 UNIT) CAPS capsule  furosemide (LASIX) 40 MG tablet   2. Which pharmacy/location (including street and city if local pharmacy) is medication to be sent to?    [facility] [place], [place]   3. Do they need a 30 day or 90 day supply?  30 days  Daughter stated the patient has a few more days of these medications left.

## 2022-06-30 NOTE — Telephone Encounter (Signed)
Pt is requesting refills.  Left vm with Yolanda per DPR that we only refill 2 of the current refills requested. Will route to PCP.

## 2022-06-30 NOTE — Progress Notes (Deleted)
*  STAT* If patient is at the pharmacy, call can be transferred to refill team.     1. Which medications need to be refilled? (please list name of each medication and dose if known)    carvedilol (COREG) 3.125 MG tablet  allopurinol (ZYLOPRIM) 100 MG tablet  Finnerenone (KERENDIA) 10 MG TABS  montelukast (SINGULAIR) 10 MG tablet  warfarin (COUMADIN) 2.5 MG tablet  famotidine (PEPCID) 40 MG tablet  dapagliflozin propanediol (FARXIGA) 10 MG TABS tablet  levothyroxine (SYNTHROID) 50 MCG tablet  atorvastatin (LIPITOR) 10 MG tablet  Vitamin D, Ergocalciferol, (DRISDOL) 1.25 MG (50000 UNIT) CAPS capsule  furosemide (LASIX) 40 MG tablet

## 2022-07-01 ENCOUNTER — Other Ambulatory Visit: Payer: Self-pay

## 2022-07-01 ENCOUNTER — Ambulatory Visit: Payer: Medicare Other | Attending: Cardiology

## 2022-07-01 ENCOUNTER — Telehealth: Payer: Self-pay

## 2022-07-01 DIAGNOSIS — Z5181 Encounter for therapeutic drug level monitoring: Secondary | ICD-10-CM

## 2022-07-01 DIAGNOSIS — Z9229 Personal history of other drug therapy: Secondary | ICD-10-CM

## 2022-07-01 LAB — POCT INR: INR: 2.8 (ref 2.0–3.0)

## 2022-07-01 NOTE — Telephone Encounter (Signed)
Patient husband came in today for samples of farxiga 10 mg, Due to her being in the donut whole with insurance.  Per DR. Cox: patient was given jardiance 10 mg to take daily, until she is able to get Patient assistance for [place].  Husband made aware, verbalized Understanding

## 2022-07-01 NOTE — Patient Instructions (Signed)
Description   Continue taking 1 tablet daily, except 1.5 tablets on Wednesdays and Saturdays.  Be consistent with greens (2 servings per week)  Recheck in 6 weeks Coumadin Clinic [phone]

## 2022-07-02 ENCOUNTER — Other Ambulatory Visit: Payer: Self-pay

## 2022-07-02 DIAGNOSIS — E1121 Type 2 diabetes mellitus with diabetic nephropathy: Secondary | ICD-10-CM

## 2022-07-02 MED ORDER — FAMOTIDINE 40 MG PO TABS: ORAL_TABLET | ORAL | 1 refills | Status: DC

## 2022-07-02 MED ORDER — LEVOTHYROXINE SODIUM 50 MCG PO TABS: ORAL_TABLET | ORAL | 1 refills | Status: DC

## 2022-07-02 MED ORDER — ATORVASTATIN CALCIUM 10 MG PO TABS: 10.0000 mg | ORAL_TABLET | Freq: Every day | ORAL | 1 refills | Status: DC

## 2022-07-02 MED ORDER — VITAMIN D (ERGOCALCIFEROL) 1.25 MG (50000 UNIT) PO CAPS: 50000.0000 [IU] | ORAL_CAPSULE | ORAL | 3 refills | Status: DC

## 2022-07-02 MED ORDER — DAPAGLIFLOZIN PROPANEDIOL 10 MG PO TABS: 10.0000 mg | ORAL_TABLET | Freq: Every day | ORAL | 0 refills | Status: DC

## 2022-07-02 MED ORDER — KERENDIA 20 MG PO TABS: 10.0000 mg | ORAL_TABLET | Freq: Every day | ORAL | 2 refills | Status: DC

## 2022-07-02 MED ORDER — ALLOPURINOL 100 MG PO TABS: 100.0000 mg | ORAL_TABLET | Freq: Two times a day (BID) | ORAL | 0 refills | Status: DC

## 2022-07-02 MED ORDER — CONTOUR NEXT TEST VI STRP: ORAL_STRIP | 2 refills | Status: DC

## 2022-07-02 MED ORDER — MONTELUKAST SODIUM 10 MG PO TABS: ORAL_TABLET | ORAL | 3 refills | Status: DC

## 2022-07-03 ENCOUNTER — Other Ambulatory Visit: Payer: Self-pay

## 2022-07-03 MED ORDER — LEVOTHYROXINE SODIUM 50 MCG PO TABS: ORAL_TABLET | ORAL | 1 refills | Status: DC

## 2022-07-03 MED ORDER — MONTELUKAST SODIUM 10 MG PO TABS: ORAL_TABLET | ORAL | 3 refills | Status: DC

## 2022-07-08 ENCOUNTER — Other Ambulatory Visit: Payer: Self-pay

## 2022-07-08 MED ORDER — LEVOTHYROXINE SODIUM 50 MCG PO TABS: ORAL_TABLET | ORAL | 1 refills | Status: DC

## 2022-07-25 ENCOUNTER — Other Ambulatory Visit: Payer: Self-pay

## 2022-07-25 MED ORDER — DAPAGLIFLOZIN PROPANEDIOL 10 MG PO TABS: 10.0000 mg | ORAL_TABLET | Freq: Every day | ORAL | 0 refills | Status: DC

## 2022-07-30 ENCOUNTER — Telehealth: Payer: Self-pay | Admitting: Cardiology

## 2022-07-30 ENCOUNTER — Other Ambulatory Visit: Payer: Self-pay | Admitting: Family Medicine

## 2022-07-30 DIAGNOSIS — Z9229 Personal history of other drug therapy: Secondary | ICD-10-CM

## 2022-07-30 MED ORDER — ALLOPURINOL 100 MG PO TABS: 100.0000 mg | ORAL_TABLET | Freq: Two times a day (BID) | ORAL | 0 refills | Status: DC

## 2022-07-30 MED ORDER — WARFARIN SODIUM 2.5 MG PO TABS: ORAL_TABLET | ORAL | 1 refills | Status: DC

## 2022-07-30 NOTE — Telephone Encounter (Signed)
Last INR 07/01/2022 Last OV 04/16/22

## 2022-07-30 NOTE — Telephone Encounter (Signed)
*  STAT* If patient is at the pharmacy, call can be transferred to refill team.   1. Which medications need to be refilled? (please list name of each medication and dose if known)  warfarin (COUMADIN) 2.5 MG tablet   2. Which pharmacy/location (including street and city if local pharmacy) is medication to be sent to? [id number] - [place], [place] - [address]   3. Do they need a 30 day or 90 day supply? 90 day

## 2022-07-30 NOTE — Telephone Encounter (Signed)
Prescription refill request received for warfarin Lov: 04/16/22 Bing Matter)  Next INR check: 08/12/22 Warfarin tablet strength: 2.5mg   Appropriate dose and refill sent to requested pharmacy.

## 2022-07-30 NOTE — Addendum Note (Signed)
Addended by: Memory Dance on: 07/30/2022 03:39 PM   Modules accepted: Orders

## 2022-07-31 ENCOUNTER — Other Ambulatory Visit: Payer: Self-pay

## 2022-07-31 MED ORDER — KERENDIA 20 MG PO TABS: 10.0000 mg | ORAL_TABLET | Freq: Every day | ORAL | 2 refills | Status: DC

## 2022-08-10 ENCOUNTER — Other Ambulatory Visit: Payer: Self-pay | Admitting: Family Medicine

## 2022-08-12 ENCOUNTER — Ambulatory Visit: Payer: Medicare Other | Attending: Cardiology

## 2022-08-12 DIAGNOSIS — Z9229 Personal history of other drug therapy: Secondary | ICD-10-CM | POA: Diagnosis not present

## 2022-08-12 DIAGNOSIS — Z5181 Encounter for therapeutic drug level monitoring: Secondary | ICD-10-CM | POA: Diagnosis not present

## 2022-08-12 LAB — POCT INR: INR: 2.5 (ref 2.0–3.0)

## 2022-08-12 NOTE — Patient Instructions (Signed)
Description   Continue taking 1 tablet daily, except 1.5 tablets on Wednesdays and Saturdays.  Be consistent with greens (2 servings per week)  Recheck in 7 weeks Coumadin Clinic [phone]

## 2022-09-01 ENCOUNTER — Ambulatory Visit (INDEPENDENT_AMBULATORY_CARE_PROVIDER_SITE_OTHER): Payer: Medicare Other | Admitting: Nurse Practitioner

## 2022-09-01 ENCOUNTER — Encounter: Payer: Self-pay | Admitting: Nurse Practitioner

## 2022-09-01 VITALS — BP 90/70 | HR 57 | Temp 97.2°F | Resp 16 | Ht 68.0 in | Wt 167.0 lb

## 2022-09-01 DIAGNOSIS — I5022 Chronic systolic (congestive) heart failure: Secondary | ICD-10-CM

## 2022-09-01 DIAGNOSIS — J209 Acute bronchitis, unspecified: Secondary | ICD-10-CM

## 2022-09-01 DIAGNOSIS — Z23 Encounter for immunization: Secondary | ICD-10-CM

## 2022-09-01 DIAGNOSIS — J440 Chronic obstructive pulmonary disease with acute lower respiratory infection: Secondary | ICD-10-CM | POA: Diagnosis not present

## 2022-09-01 DIAGNOSIS — L97921 Non-pressure chronic ulcer of unspecified part of left lower leg limited to breakdown of skin: Secondary | ICD-10-CM

## 2022-09-01 DIAGNOSIS — E1165 Type 2 diabetes mellitus with hyperglycemia: Secondary | ICD-10-CM

## 2022-09-01 DIAGNOSIS — L97911 Non-pressure chronic ulcer of unspecified part of right lower leg limited to breakdown of skin: Secondary | ICD-10-CM

## 2022-09-01 MED ORDER — BACITRACIN 500 UNIT/GM EX OINT: 1.0000 | TOPICAL_OINTMENT | Freq: Two times a day (BID) | CUTANEOUS | 0 refills | Status: DC

## 2022-09-01 MED ORDER — PROMETHAZINE-DM 6.25-15 MG/5ML PO SYRP: 5.0000 mL | ORAL_SOLUTION | Freq: Four times a day (QID) | ORAL | 0 refills | Status: DC | PRN

## 2022-09-01 NOTE — Assessment & Plan Note (Signed)
Looks healing but fluid is coming off the wound Dressing applied using sterile technique. Bacitracin ointment applied to the wound Refer to the wound clinic

## 2022-09-01 NOTE — Progress Notes (Signed)
Acute Office Visit  Subjective:    Patient ID: Breanna Bennett, female    DOB: October 10, 1955, 67 y.o.   MRN: [id number]  Chief Complaints: chronic cough and lower leg ulcers   History of Present ilIness: Patient  presents to the clinic accompanied with her daughter with a complain of a ongoing on and off kind of nonproductive cough, daughter thinks it might have something to do with her CONGESTIVE HEART FAILURE. They are following up with Dr. Mauri Brooklyn regarding her Heart problem. Their next follow up with Dr Kirtland Bouchard is next month.  Patient has a chronic bilateral lower leg ulcer, she used to go to wound care before but stopped in between just because she did not like the way they treated her wound. She is taking care of her wound self now with some OTC cream. She said her wound is getting better in compared to the past. She is also using OTC numbing spray to get rid of the pain.  Past Medical History:  Diagnosis Date   Acute CVA (cerebrovascular accident) (HCC) 05/30/2019   Acute CVA (cerebrovascular accident) (HCC) 05/30/2019   Acute respiratory failure (HCC)    Acute systolic CHF (congestive heart failure) (HCC) 01/12/2019   Cardiac cachexia 07/04/2020   Chronic systolic CHF (congestive heart failure) (HCC)    Class 2 severe obesity due to excess calories with serious comorbidity and body mass index (BMI) of 38.0 to 38.9 in adult (HCC) 10/19/2018   Cough due to bronchospasm 02/16/2019   Elevated hemoglobin (HCC)    Elevated uric acid in blood 06/06/2019   Essential hypertension 01/12/2019   Gastroesophageal reflux disease 10/19/2018   GERD (gastroesophageal reflux disease)    History of Coumadin therapy 06/16/2019   History of CVA (cerebrovascular accident) 05/30/2019   History of gout 12/06/2019   History of hemiplegia 05/30/2019   Hospital discharge follow-up 06/24/2019   Hyperlipidemia    Hypertension    Hypokalemia 12/06/2019   Impaired mobility and ADLs 05/30/2019   Late effect of  cerebrovascular accident (CVA) 12/12/2019   LBBB (left bundle branch block)    Left ventricular ejection fraction less than 20% 05/30/2019   Middle cerebral artery embolism, 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   Mild renal insufficiency    Mitral regurgitation    NICM (nonischemic cardiomyopathy) (HCC)    Nonischemic cardiomyopathy (HCC) cardiac catheterization May 2020 showed normal coronaries, ejection fraction 30% 02/09/2019   Open wound 1 year ago   Bilateral legs   Perennial allergic rhinitis 02/16/2019   Peripheral edema 12/06/2019   Pre-diabetes    Stroke (cerebrum) (HCC) - R MCA infarct s/p mechanical thrombectomy 05/19/2019   Systolic CHF, acute (HCC) 02/09/2019   Tricuspid regurgitation    Type 2 diabetes mellitus with hyperglycemia, without long-term current use of insulin (HCC) 05/30/2019   Type 2 diabetes mellitus, uncontrolled 05/27/2019    Past Surgical History:  Procedure Laterality Date   IR ANGIO VERTEBRAL SEL SUBCLAVIAN INNOMINATE UNI R MOD SED  05/19/2019   IR CT HEAD LTD  05/19/2019   IR PERCUTANEOUS ART THROMBECTOMY/INFUSION INTRACRANIAL INC DIAG ANGIO  05/19/2019   RADIOLOGY WITH ANESTHESIA N/A 05/19/2019   Procedure: IR WITH ANESTHESIA;  Surgeon: Julieanne Cotton, MD;  Location: [facility] OR;  Service: Radiology;  Laterality: N/A;   RIGHT/LEFT HEART CATH AND CORONARY ANGIOGRAPHY N/A 01/13/2019   Procedure: RIGHT/LEFT HEART CATH AND CORONARY ANGIOGRAPHY;  Surgeon: Runell Gess, MD;  Location: [facility] INVASIVE CV LAB;  Service: Cardiovascular;  Laterality: N/A;   TUBAL LIGATION  Family History  Problem Relation Age of Onset   CAD Mother    Heart failure Mother    Hypertension Mother    Colon cancer Father    Cancer Brother     Social History   Socioeconomic History   Marital status: Unknown    Spouse name: Not on file   Number of children: Not on file   Years of education: Not on file   Highest education level: Not on file  Occupational History   Not on file  Tobacco Use    Smoking status: Never   Smokeless tobacco: Never  Vaping Use   Vaping Use: Never used  Substance and Sexual Activity   Alcohol use: Never   Drug use: Never   Sexual activity: Not on file  Other Topics Concern   Not on file  Social History Narrative   Not on file   Social Determinants of Health   Financial Resource Strain: Medium Risk (02/21/2022)   Overall Financial Resource Strain (CARDIA)    Difficulty of Paying Living Expenses: Somewhat hard  Food Insecurity: Food Insecurity Present (02/21/2022)   Hunger Vital Sign    Worried About [place] in the Last Year: Sometimes true    Ran Out of Food in the Last Year: Sometimes true  Transportation Needs: No Transportation Needs (02/21/2022)   PRAPARE - Hydrologist (Medical): No    Lack of Transportation (Non-Medical): No  Physical Activity: Sufficiently Active (02/21/2022)   Exercise Vital Sign    Days of Exercise per Week: 7 days    Minutes of Exercise per Session: 30 min  Stress: No Stress Concern Present (02/21/2022)   [place]    Feeling of Stress : Not at all  Social Connections: Moderately Isolated (02/21/2022)   Social Connection and Isolation Panel [NHANES]    Frequency of Communication with Friends and Family: Once a week    Frequency of Social Gatherings with Friends and Family: Three times a week    Attends Religious Services: Never    Active Member of Clubs or Organizations: No    Attends Archivist Meetings: Never    Marital Status: Married  Human resources officer Violence: Not At Risk (02/21/2022)   Humiliation, Afraid, Rape, and Kick questionnaire    Fear of Current or Ex-Partner: No    Emotionally Abused: No    Physically Abused: No    Sexually Abused: No    Outpatient Medications Prior to Visit  Medication Sig Dispense Refill   albuterol (PROAIR HFA) 108 (90 Base) MCG/ACT inhaler Inhale 2 puffs into  the lungs every 4 (four) hours as needed for shortness of breath or wheezing. 3 each 1   allopurinol (ZYLOPRIM) 100 MG tablet Take 1 tablet (100 mg total) by mouth 2 (two) times daily. 180 tablet 0   azelastine (OPTIVAR) 0.05 % ophthalmic solution Place 1 drop into both eyes 2 (two) times daily. 6 mL 12   Blood Glucose Calibration (CONTOUR NEXT CONTROL) Low SOLN 1 drop by In Vitro route every 30 (thirty) days. 1 each 1   Blood Glucose Monitoring Suppl (CONTOUR NEXT GEN MONITOR) w/Device KIT 1 each by Does not apply route 2 (two) times daily. 1 kit 0   carvedilol (COREG) 3.125 MG tablet Take 1 tablet (3.125 mg total) by mouth 2 (two) times daily with a meal. 180 tablet 2   dapagliflozin propanediol (FARXIGA) 10 MG TABS tablet Take 1  tablet (10 mg total) by mouth daily before breakfast. 90 tablet 0   famotidine (PEPCID) 40 MG tablet TAKE 1 TABLET(40 MG) BY MOUTH AT BEDTIME 90 tablet 1   Finerenone (KERENDIA) 20 MG TABS Take 10 mg by mouth daily. 30 tablet 2   fluticasone (FLONASE) 50 MCG/ACT nasal spray Place 2 sprays into both nostrils daily as needed for allergies or rhinitis. 18.2 mL 3   furosemide (LASIX) 40 MG tablet Take 60 mg (1 1/2 tablet) daily. 210 tablet 2   glucose blood (CONTOUR NEXT TEST) test strip Check fasting sugar once daily. 100 each 2   hypromellose (GENTEAL SEVERE) 0.3 % GEL ophthalmic ointment Place into both eyes every 6 (six) hours as needed for dry eyes. (Patient taking differently: Place 1 Application into both eyes every 6 (six) hours as needed for dry eyes.) 10 g 2   levothyroxine (SYNTHROID) 50 MCG tablet TAKE 1 TABLET(50 MCG) BY MOUTH DAILY BEFORE BREAKFAST Strength: 50 mcg 90 tablet 1   loratadine (CLARITIN) 10 MG tablet Take 1 tablet (10 mg total) by mouth daily. 90 tablet 1   Microlet Lancets MISC 1 each by Does not apply route in the morning and at bedtime. 100 each 2   montelukast (SINGULAIR) 10 MG tablet TAKE 1 TABLET(10 MG) BY MOUTH AT BEDTIME Strength: 10 mg 90  tablet 3   olopatadine (PATANOL) 0.1 % ophthalmic solution Place 1 drop into both eyes 2 (two) times daily. 5 mL 2   Vitamin D, Ergocalciferol, (DRISDOL) 1.25 MG (50000 UNIT) CAPS capsule Take 1 capsule (50,000 Units total) by mouth every Monday. 12 capsule 3   warfarin (COUMADIN) 2.5 MG tablet Take 1 tablet by mouth daily except 1.5 tablets on Wednesdays and Saturdays or as directed by Anticoagulation Clinic. 100 tablet 1   atorvastatin (LIPITOR) 10 MG tablet Take 1 tablet (10 mg total) by mouth daily. 90 tablet 1   Potassium Chloride ER 20 MEQ TBCR TAKE 1 TABLET(20 MEQ) BY MOUTH TWICE DAILY (Patient not taking: Reported on 09/01/2022) 180 tablet 0   No facility-administered medications prior to visit.    Allergies  Allergen Reactions   Lisinopril Cough   Losartan Cough    Review of Systems  Constitutional:  Negative for chills and fatigue.  HENT:  Positive for congestion. Negative for ear pain, sinus pain and sore throat.   Respiratory:  Positive for cough. Negative for shortness of breath.   Cardiovascular:  Positive for leg swelling. Negative for chest pain.  Gastrointestinal:  Negative for abdominal pain, constipation, diarrhea, nausea and vomiting.  Genitourinary:  Negative for dysuria and frequency.  Musculoskeletal:  Negative for arthralgias, back pain and myalgias.  Neurological:  Negative for dizziness and headaches.       Objective:    Physical Exam Vitals reviewed.  Constitutional:      Appearance: Normal appearance. She is normal weight.  Neck:     Vascular: No carotid bruit.  Cardiovascular:     Rate and Rhythm: Normal rate and regular rhythm.     Heart sounds: Murmur heard.  Pulmonary:     Effort: Pulmonary effort is normal.     Breath sounds: Normal breath sounds.  Abdominal:     General: Abdomen is flat. Bowel sounds are normal.     Palpations: Abdomen is soft.     Tenderness: There is no abdominal tenderness.  Musculoskeletal:        General: Signs of  injury present.     Cervical back: Normal range  of motion.     Right lower leg: Edema (+2 edema) present.     Left lower leg: Edema (+2 edema) present.     Comments: Lower leg edema, weeping, wounds  3 small wounds in right  2 small wounds in left leg  Skin:    General: Skin is warm.  Neurological:     Mental Status: She is alert and oriented to person, place, and time.  Psychiatric:        Mood and Affect: Mood normal.        Behavior: Behavior normal.    BP 90/70   Pulse (!) 57   Temp (!) 97.2 F (36.2 C)   Resp 16   Ht 5\' 8"  (1.727 m)   Wt 167 lb (75.8 kg)   SpO2 100%   BMI 25.39 kg/m  Wt Readings from Last 3 Encounters:  09/01/22 167 lb (75.8 kg)  04/16/22 157 lb 9.6 oz (71.5 kg)  02/21/22 151 lb (68.5 kg)    Health Maintenance Due  Topic Date Due   COVID-19 Vaccine (1) Never done   Hepatitis C Screening  Never done   Zoster Vaccines- Shingrix (1 of 2) Never done   MAMMOGRAM  08/11/2020   DTaP/Tdap/Td (2 - Td or Tdap) 01/16/2021   DEXA SCAN  Never done   INFLUENZA VACCINE  03/25/2022   HEMOGLOBIN A1C  08/23/2022    There are no preventive care reminders to display for this patient.   Lab Results  Component Value Date   TSH 1.430 11/06/2021   Lab Results  Component Value Date   WBC 6.3 02/21/2022   HGB 15.7 02/21/2022   HCT 46.8 (H) 02/21/2022   MCV 86 02/21/2022   PLT 222 02/21/2022   Lab Results  Component Value Date   NA 136 04/16/2022   K 4.1 04/16/2022   CO2 20 04/16/2022   GLUCOSE 119 (H) 04/16/2022   BUN 33 (H) 04/16/2022   CREATININE 1.22 (H) 04/16/2022   BILITOT 1.2 02/21/2022   ALKPHOS 97 02/21/2022   AST 21 02/21/2022   ALT 7 02/21/2022   PROT 8.7 (H) 02/21/2022   ALBUMIN 4.0 02/21/2022   CALCIUM 9.5 04/16/2022   ANIONGAP 16 (H) 05/30/2019   EGFR 49 (L) 04/16/2022   Lab Results  Component Value Date   CHOL 124 11/06/2021   Lab Results  Component Value Date   HDL 33 (L) 11/06/2021   Lab Results  Component Value Date    LDLCALC 79 11/06/2021   Lab Results  Component Value Date   TRIG 55 11/06/2021   Lab Results  Component Value Date   CHOLHDL 3.8 11/06/2021   Lab Results  Component Value Date   HGBA1C 7.2 (H) 02/21/2022       Assessment & Plan:   Problem List Items Addressed This Visit       Respiratory   Acute bronchitis with COPD (HCC) - Primary    Well controlled Taking albuterol Puff as needed Taking Montelukast at bed time Taking Claritin as needed at bed time      Relevant Medications   promethazine-dextromethorphan (PROMETHAZINE-DM) 6.25-15 MG/5ML syrup   Other Relevant Orders   Hemoglobin A1c     Endocrine   Type 2 diabetes mellitus with hyperglycemia, without long-term current use of insulin (HCC)   Relevant Orders   Comprehensive metabolic panel   CBC with Differential/Platelet     Musculoskeletal and Integument   Leg ulcer, left, limited to breakdown of skin (HCC)  Looks healing but fluid is coming off the wound Dressing applied using sterile technique. Bacitracin ointment applied to the wound Refer to the wound clinic      Relevant Medications   bacitracin 500 UNIT/GM ointment   Leg ulcer, right, limited to breakdown of skin (HCC)    Looks healing but fluid is coming off the wound Dressing applied using sterile technique. Bacitracin ointment applied to the wound Refer to the wound clinic      Relevant Medications   bacitracin 500 UNIT/GM ointment      Follow-up:  next week for lipid profile and to recheck her wound  I, Ivionna Verley have reviewed all documentation for this visit. The documentation on 09/01/22   for the exam, diagnosis, procedures, and orders are all accurate and complete.      An After Visit Summary was printed and given to the patient.  Lurline Del, DNP, FNP [facility] [phone]

## 2022-09-01 NOTE — Assessment & Plan Note (Signed)
Looks healing but fluid is coming off the wound Dressing applied using sterile technique. Bacitracin ointment applied to the wound Refer to the wound clinic 

## 2022-09-01 NOTE — Patient Instructions (Addendum)
Follow up next week for lipid profile and wound check up   Wound Care, Adult Taking care of your wound properly can help to prevent pain, infection, and scarring. It can also help your wound heal more quickly. Follow instructions from your health care provider about how to care for your wound. Supplies needed: Soap and water. Wound cleanser, saline, or germ-free (sterile) water. Gauze. If needed, a clean bandage (dressing) or other type of wound dressing material to cover or place in the wound. Follow your health care provider's instructions about what dressing supplies to use. Cream or topical ointment to apply to the wound, if told by your health care provider. How to care for your wound Cleaning the wound Ask your health care provider how to clean the wound. This may include: Using mild soap and water, a wound cleanser, saline, or sterile water. Using a clean gauze to pat the wound dry after cleaning it. Do not rub or scrub the wound. Dressing care Wash your hands with soap and water for at least 20 seconds before and after you change the dressing. If soap and water are not available, use hand sanitizer. Change your dressing as told by your health care provider. This may include: Cleaning or rinsing out (irrigating) the wound. Application of cream or topical ointment, if told by your health care provider. Placing a dressing over the wound or in the wound (packing). Covering the wound with an outer dressing. Leave stitches (sutures), staples, skin glue, or adhesive strips in place. These skin closures may need to stay in place for 2 weeks or longer. If adhesive strip edges start to loosen and curl up, you may trim the loose edges. Do not remove adhesive strips completely unless your health care provider tells you to do that. Ask your health care provider when you can leave the wound uncovered. Checking for infection Check your wound area every day for signs of infection. Check for: More  redness, swelling, or pain. Fluid or blood. Warmth. Pus or a bad smell.  Follow these instructions at home Medicines If you were prescribed an antibiotic medicine, cream, or ointment, take or apply it as told by your health care provider. Do not stop using the antibiotic even if your condition improves. If you were prescribed pain medicine, take it 30 minutes before you do any wound care or as told by your health care provider. Take over-the-counter and prescription medicines only as told by your health care provider. Eating and drinking Eat a diet that includes protein, vitamin A, vitamin C, and other nutrient-rich foods to help the wound heal. Foods rich in protein include meat, fish, eggs, dairy, beans, and nuts. Foods rich in vitamin A include carrots and dark green, leafy vegetables. Foods rich in vitamin C include citrus fruits, tomatoes, broccoli, and peppers. Drink enough fluid to keep your urine pale yellow. General instructions Do not take baths, swim, or use a hot tub until your health care provider approves. Ask your health care provider if you may take showers. You may only be allowed to take sponge baths. Do not scratch or pick at the wound. Keep it covered as told by your health care provider. Return to your normal activities as told by your health care provider. Ask your health care provider what activities are safe for you. Protect your wound from the sun when you are outside for the first 6 months, or for as long as told by your health care provider. Cover up the scar area  or apply sunscreen that has an SPF of at least 30. Do not use any products that contain nicotine or tobacco. These products include cigarettes, chewing tobacco, and vaping devices, such as e-cigarettes. If you need help quitting, ask your health care provider. Keep all follow-up visits. This is important. Contact a health care provider if: You received a tetanus shot and you have swelling, severe pain,  redness, or bleeding at the injection site. Your pain is not controlled with medicine. You have any of these signs of infection: More redness, swelling, or pain around the wound. Fluid or blood coming from the wound. Warmth coming from the wound. A fever or chills. You are nauseous or you vomit. You are dizzy. You have a new rash or hardness around the wound. Get help right away if: You have a red streak of skin near the area around your wound. Pus or a bad smell coming from the wound. Your wound has been closed with staples, sutures, skin glue, or adhesive strips and it begins to open up and separate. Your wound is bleeding, and the bleeding does not stop with gentle pressure. These symptoms may represent a serious problem that is an emergency. Do not wait to see if the symptoms will go away. Get medical help right away. Call your local emergency services (911 in the [place] Do not drive yourself to the hospital. Summary Always wash your hands with soap and water for at least 20 seconds before and after changing your dressing. Change your dressing as told by your health care provider. To help with healing, eat foods that are rich in protein, vitamin A, vitamin C, and other nutrients. Check your wound every day for signs of infection. Contact your health care provider if you think that your wound is infected. This information is not intended to replace advice given to you by your health care provider. Make sure you discuss any questions you have with your health care provider. Document Revised: 12/18/2020 Document Reviewed: 12/18/2020 Elsevier Patient Education  2023 [employer].

## 2022-09-01 NOTE — Assessment & Plan Note (Signed)
Well controlled Taking albuterol Puff as needed Taking Montelukast at bed time Taking Claritin as needed at bed time

## 2022-09-01 NOTE — Assessment & Plan Note (Signed)
The current medical regimen is effective; Furosemide 60 mg twice a day and Carvedilol 3.125 mg 1 tablet twice a day.

## 2022-09-02 LAB — COMPREHENSIVE METABOLIC PANEL
ALT: 7 IU/L (ref 0–32)
AST: 14 IU/L (ref 0–40)
Albumin/Globulin Ratio: 0.9 — ABNORMAL LOW (ref 1.2–2.2)
Albumin: 3.6 g/dL — ABNORMAL LOW (ref 3.9–4.9)
Alkaline Phosphatase: 129 IU/L — ABNORMAL HIGH (ref 44–121)
BUN/Creatinine Ratio: 21 (ref 12–28)
BUN: 29 mg/dL — ABNORMAL HIGH (ref 8–27)
Bilirubin Total: 1.4 mg/dL — ABNORMAL HIGH (ref 0.0–1.2)
CO2: 24 mmol/L (ref 20–29)
Calcium: 9.1 mg/dL (ref 8.7–10.3)
Chloride: 100 mmol/L (ref 96–106)
Creatinine, Ser: 1.37 mg/dL — ABNORMAL HIGH (ref 0.57–1.00)
Globulin, Total: 4.2 g/dL (ref 1.5–4.5)
Glucose: 99 mg/dL (ref 70–99)
Potassium: 3.9 mmol/L (ref 3.5–5.2)
Sodium: 137 mmol/L (ref 134–144)
Total Protein: 7.8 g/dL (ref 6.0–8.5)
eGFR: 43 mL/min/{1.73_m2} — ABNORMAL LOW (ref >59)

## 2022-09-02 LAB — CBC WITH DIFFERENTIAL/PLATELET
Basophils Absolute: 0.1 10*3/uL (ref 0.0–0.2)
Basos: 1 %
EOS (ABSOLUTE): 0.2 10*3/uL (ref 0.0–0.4)
Eos: 4 %
Hematocrit: 44.8 % (ref 34.0–46.6)
Hemoglobin: 14.4 g/dL (ref 11.1–15.9)
Immature Grans (Abs): 0 10*3/uL (ref 0.0–0.1)
Immature Granulocytes: 0 %
Lymphocytes Absolute: 1.5 10*3/uL (ref 0.7–3.1)
Lymphs: 32 %
MCH: 27 pg (ref 26.6–33.0)
MCHC: 32.1 g/dL (ref 31.5–35.7)
MCV: 84 fL (ref 79–97)
Monocytes Absolute: 0.4 10*3/uL (ref 0.1–0.9)
Monocytes: 9 %
Neutrophils Absolute: 2.6 10*3/uL (ref 1.4–7.0)
Neutrophils: 54 %
Platelets: 222 10*3/uL (ref 150–450)
RBC: 5.33 x10E6/uL — ABNORMAL HIGH (ref 3.77–5.28)
RDW: 15.9 % — ABNORMAL HIGH (ref 11.7–15.4)
WBC: 4.7 10*3/uL (ref 3.4–10.8)

## 2022-09-02 LAB — HEMOGLOBIN A1C
Est. average glucose Bld gHb Est-mCnc: 163 mg/dL
Hgb A1c MFr Bld: 7.3 % — ABNORMAL HIGH (ref 4.8–5.6)

## 2022-09-21 DIAGNOSIS — S0512XA Contusion of eyeball and orbital tissues, left eye, initial encounter: Secondary | ICD-10-CM | POA: Diagnosis not present

## 2022-09-21 DIAGNOSIS — W19XXXA Unspecified fall, initial encounter: Secondary | ICD-10-CM | POA: Diagnosis not present

## 2022-09-21 DIAGNOSIS — R791 Abnormal coagulation profile: Secondary | ICD-10-CM | POA: Diagnosis not present

## 2022-09-21 DIAGNOSIS — S0285XA Fracture of orbit, unspecified, initial encounter for closed fracture: Secondary | ICD-10-CM | POA: Diagnosis not present

## 2022-09-24 ENCOUNTER — Telehealth: Payer: Self-pay

## 2022-09-24 NOTE — Telephone Encounter (Signed)
Called and left message with patient's husband he stated that patient was asleep at the moment and would take a message. I asked him to have her call us back so we can find out if she has follow up with Optho or if we need to get her in to have a hospital follow up. Waiting for patient to call us back.

## 2022-09-25 ENCOUNTER — Telehealth: Payer: Self-pay

## 2022-09-25 NOTE — Telephone Encounter (Signed)
     Patient  visit on 09/25/2022  at [facility] was for eye problem.  Have you been able to follow up with your primary care physician? Appointment scheduled 09/26/2022.  The patient was or was not able to obtain any needed medicine or equipment. No medication prescribed.  Are there diet recommendations that you are having difficulty following? No. Patient expressed interest in food pantry list. Verified mailing address to send [facility] [employer].  Patient expresses understanding of discharge instructions and education provided has no other needs at this time.    [place] Resource Care Guide   [name]  ?? [phone]   Website: [email or web address]  [email or web address]

## 2022-09-26 ENCOUNTER — Inpatient Hospital Stay: Payer: Medicare Other | Admitting: Family Medicine

## 2022-09-29 ENCOUNTER — Other Ambulatory Visit: Payer: Self-pay

## 2022-09-29 ENCOUNTER — Telehealth: Payer: Self-pay

## 2022-09-29 DIAGNOSIS — S0232XB Fracture of orbital floor, left side, initial encounter for open fracture: Secondary | ICD-10-CM

## 2022-09-29 NOTE — Telephone Encounter (Signed)
Mrs. Beebe daughter called about the missed appointment last week with Dr. Tobie Poet.  She failed to keep the hospital follow-up appointment.  When she was seen in the ED she was referred to [facility] but it was a hardship to travel to [place].  We do not have any available appointments for hospital follow-up for several days.  Problem was discussed with Dr. Tobie Poet and referral was sent to [facility] for follow-up of left orbital floor blowout fracture.

## 2022-09-30 ENCOUNTER — Ambulatory Visit: Payer: Medicare Other | Attending: Cardiology

## 2022-09-30 DIAGNOSIS — Z5181 Encounter for therapeutic drug level monitoring: Secondary | ICD-10-CM

## 2022-09-30 DIAGNOSIS — Z9229 Personal history of other drug therapy: Secondary | ICD-10-CM

## 2022-09-30 LAB — POCT INR: INR: 3.6 — AB (ref 2.0–3.0)

## 2022-09-30 NOTE — Patient Instructions (Addendum)
Description   HOLD today's dose and then continue taking 1 tablet daily, except 1.5 tablets on Wednesdays and Saturdays.  Be consistent with greens (2 servings per week)  Recheck in 4 weeks Coumadin Clinic [phone]

## 2022-10-07 ENCOUNTER — Telehealth: Payer: Self-pay

## 2022-10-07 NOTE — Telephone Encounter (Signed)
Breanna Bennett's daughter called with questions about the GI bug.  She said that several of the family members have diarrhea.  Breanna Bennett has had symptoms for the last 48 hours.  She has no fever or chills reported and no vomiting.  Advised a light diet, no diary products and advance diet slowly (toast, crackers, apple sauce, bananas). If she should develop vomiting or concerns with dehydration she was instructed to go to the ED for evaluation.  She also mentioned that her mother has not been seen in follow-up for her eye.  She was referred to [facility] on Feb.5 as an urgent referral.  She was given the number to call [phone].

## 2022-10-27 ENCOUNTER — Other Ambulatory Visit: Payer: Self-pay

## 2022-10-28 ENCOUNTER — Encounter: Payer: Self-pay | Admitting: Cardiology

## 2022-10-28 ENCOUNTER — Ambulatory Visit: Payer: Medicare Other | Attending: Cardiology | Admitting: Cardiology

## 2022-10-28 ENCOUNTER — Ambulatory Visit (INDEPENDENT_AMBULATORY_CARE_PROVIDER_SITE_OTHER): Payer: Medicare Other

## 2022-10-28 VITALS — BP 70/50 | HR 85 | Ht 68.0 in | Wt 149.6 lb

## 2022-10-28 DIAGNOSIS — E871 Hypo-osmolality and hyponatremia: Secondary | ICD-10-CM | POA: Diagnosis not present

## 2022-10-28 DIAGNOSIS — I420 Dilated cardiomyopathy: Secondary | ICD-10-CM | POA: Diagnosis not present

## 2022-10-28 DIAGNOSIS — I83019 Varicose veins of right lower extremity with ulcer of unspecified site: Secondary | ICD-10-CM | POA: Diagnosis not present

## 2022-10-28 DIAGNOSIS — R531 Weakness: Secondary | ICD-10-CM | POA: Diagnosis not present

## 2022-10-28 DIAGNOSIS — I959 Hypotension, unspecified: Secondary | ICD-10-CM | POA: Diagnosis not present

## 2022-10-28 DIAGNOSIS — E782 Mixed hyperlipidemia: Secondary | ICD-10-CM

## 2022-10-28 DIAGNOSIS — I340 Nonrheumatic mitral (valve) insufficiency: Secondary | ICD-10-CM

## 2022-10-28 DIAGNOSIS — Z7901 Long term (current) use of anticoagulants: Secondary | ICD-10-CM | POA: Diagnosis not present

## 2022-10-28 DIAGNOSIS — I361 Nonrheumatic tricuspid (valve) insufficiency: Secondary | ICD-10-CM | POA: Diagnosis not present

## 2022-10-28 DIAGNOSIS — I342 Nonrheumatic mitral (valve) stenosis: Secondary | ICD-10-CM | POA: Diagnosis not present

## 2022-10-28 DIAGNOSIS — E1165 Type 2 diabetes mellitus with hyperglycemia: Secondary | ICD-10-CM

## 2022-10-28 DIAGNOSIS — Z8673 Personal history of transient ischemic attack (TIA), and cerebral infarction without residual deficits: Secondary | ICD-10-CM

## 2022-10-28 DIAGNOSIS — N3289 Other specified disorders of bladder: Secondary | ICD-10-CM | POA: Diagnosis not present

## 2022-10-28 DIAGNOSIS — N179 Acute kidney failure, unspecified: Secondary | ICD-10-CM | POA: Diagnosis not present

## 2022-10-28 DIAGNOSIS — R609 Edema, unspecified: Secondary | ICD-10-CM | POA: Diagnosis not present

## 2022-10-28 DIAGNOSIS — I447 Left bundle-branch block, unspecified: Secondary | ICD-10-CM

## 2022-10-28 DIAGNOSIS — Z9229 Personal history of other drug therapy: Secondary | ICD-10-CM

## 2022-10-28 DIAGNOSIS — I5022 Chronic systolic (congestive) heart failure: Secondary | ICD-10-CM

## 2022-10-28 DIAGNOSIS — R031 Nonspecific low blood-pressure reading: Secondary | ICD-10-CM | POA: Diagnosis not present

## 2022-10-28 NOTE — Patient Instructions (Signed)
Description   POC INR unable to result. Pt sent to lab for STAT PT/INR (after office visit with Dr Agustin Cree). Due to other complications, pt was taken to ER via EMS per Dr Joycelyn Rua from office visit. See office visit note.  Normal Dose: 1 tablet daily, except 1.5 tablets on Wednesdays and Saturdays.  Be consistent with greens (2 servings per week)  Recheck INR post hospitalization.  Coumadin Clinic [phone]

## 2022-10-28 NOTE — Progress Notes (Unsigned)
Cardiology Office Note:    Date:  10/28/2022   ID:  Lynora Twombly, DOB [birth year], MRN [id number]  PCP:  Rochel Brome, MD  Cardiologist:  Jenne Campus, MD    Referring MD: Rochel Brome, MD   Chief Complaint  Patient presents with   Follow-up    History of Present Illness:    Amouri Big is a 67 y.o. female  with past medical history significant for nonischemic cardiomyopathy ejection fraction 20 to 25%, chronic left bundle branch block, dyslipidemia, essential hypertension, history of CVA, congestive heart failure and she is in [place] class III. However, she refused any aggressive measurements. She does not want to have ICD implanted she does not want to have BiV pacing.  She comes today to months of regular follow-up.  She looks bad she is cachectic weak tired and exhausted.  Denies have any chest pain tightness squeezing pressure burning chest no palpitation dizziness swelling of lower extremities appears to be right worse.  She does have her legs wrapped like always.  She did have her INR done today which is very high.  Her blood pressure also is very low.  Saturation is low as well.  Past Medical History:  Diagnosis Date   Acute CVA (cerebrovascular accident) ([place]) 05/30/2019   Acute respiratory failure ([place])    Acute systolic CHF (congestive heart failure) ([place]) 01/12/2019   Cardiac cachexia 07/04/2020   Class 2 severe obesity due to excess calories with serious comorbidity and body mass index (BMI) of 38.0 to 38.9 in adult ([place]) 10/19/2018   Cough due to bronchospasm 02/16/2019   Elevated hemoglobin (HCC)    Elevated uric acid in blood 06/06/2019   Essential hypertension 01/12/2019   Gastroesophageal reflux disease 10/19/2018   GERD (gastroesophageal reflux disease)    History of Coumadin therapy 06/16/2019   History of gout 12/06/2019   History of hemiplegia 05/30/2019   Hyperlipidemia    Hypokalemia 12/06/2019   Impaired mobility and ADLs  05/30/2019   Late effect of cerebrovascular accident (CVA) 12/12/2019   LBBB (left bundle branch block)    Left ventricular ejection fraction less than 20% 05/30/2019   Middle cerebral artery embolism, 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   Mild renal insufficiency    Mitral regurgitation    Nonischemic cardiomyopathy ([place]) cardiac catheterization May 2020 showed normal coronaries, ejection fraction 30% 02/09/2019   Open wound 1 year ago   Bilateral legs   Perennial allergic rhinitis 02/16/2019   Peripheral edema 12/06/2019   Stroke (cerebrum) ([place]) - R MCA infarct s/p mechanical thrombectomy [id number]   Systolic CHF, acute ([place]) 02/09/2019   Tricuspid regurgitation    Type 2 diabetes mellitus with hyperglycemia, without long-term current use of insulin ([place]) 05/30/2019   Type 2 diabetes mellitus, uncontrolled 05/27/2019    Past Surgical History:  Procedure Laterality Date   IR ANGIO VERTEBRAL SEL SUBCLAVIAN INNOMINATE UNI R MOD SED  05/19/2019   IR CT HEAD LTD  05/19/2019   IR PERCUTANEOUS ART THROMBECTOMY/INFUSION INTRACRANIAL INC DIAG ANGIO  05/19/2019   RADIOLOGY WITH ANESTHESIA N/A 05/19/2019   Procedure: IR WITH ANESTHESIA;  Surgeon: Luanne Bras, MD;  Location: [facility];  Service: Radiology;  Laterality: N/A;   RIGHT/LEFT HEART CATH AND CORONARY ANGIOGRAPHY N/A 01/13/2019   Procedure: RIGHT/LEFT HEART CATH AND CORONARY ANGIOGRAPHY;  Surgeon: Lorretta Harp, MD;  Location: [facility];  Service: Cardiovascular;  Laterality: N/A;   TUBAL LIGATION      Current Medications: Current Meds  Medication Sig  albuterol (PROAIR HFA) 108 (90 Base) MCG/ACT inhaler Inhale 2 puffs into the lungs every 4 (four) hours as needed for shortness of breath or wheezing.   allopurinol (ZYLOPRIM) 100 MG tablet Take 1 tablet (100 mg total) by mouth 2 (two) times daily.   atorvastatin (LIPITOR) 10 MG tablet Take 10 mg by mouth daily.   azelastine (OPTIVAR) 0.05 % ophthalmic solution Place 1 drop into both  eyes 2 (two) times daily.   bacitracin 500 UNIT/GM ointment Apply 1 Application topically 2 (two) times daily.   Blood Glucose Calibration (CONTOUR NEXT CONTROL) Low SOLN 1 drop by In Vitro route every 30 (thirty) days.   Blood Glucose Monitoring Suppl (CONTOUR NEXT GEN MONITOR) w/Device KIT 1 each by Does not apply route 2 (two) times daily.   carvedilol (COREG) 3.125 MG tablet Take 1 tablet (3.125 mg total) by mouth 2 (two) times daily with a meal.   dapagliflozin propanediol (FARXIGA) 10 MG TABS tablet Take 1 tablet (10 mg total) by mouth daily before breakfast.   famotidine (PEPCID) 40 MG tablet TAKE 1 TABLET(40 MG) BY MOUTH AT BEDTIME   Finerenone (KERENDIA) 20 MG TABS Take 10 mg by mouth daily.   fluticasone (FLONASE) 50 MCG/ACT nasal spray Place 2 sprays into both nostrils daily as needed for allergies or rhinitis.   furosemide (LASIX) 40 MG tablet Take 60 mg (1 1/2 tablet) daily.   glipiZIDE (GLUCOTROL XL) 2.5 MG 24 hr tablet Take 2.5 mg by mouth daily with breakfast.   glucose blood (CONTOUR NEXT TEST) test strip Check fasting sugar once daily.   hypromellose (GENTEAL SEVERE) 0.3 % GEL ophthalmic ointment Place into both eyes every 6 (six) hours as needed for dry eyes. (Patient taking differently: Place 1 Application into both eyes every 6 (six) hours as needed for dry eyes.)   levothyroxine (SYNTHROID) 50 MCG tablet TAKE 1 TABLET(50 MCG) BY MOUTH DAILY BEFORE BREAKFAST Strength: 50 mcg   loratadine (CLARITIN) 10 MG tablet Take 1 tablet (10 mg total) by mouth daily.   magnesium oxide (MAG-OX) 400 MG tablet Take 400 mg by mouth 3 (three) times daily. 10days   Microlet Lancets MISC 1 each by Does not apply route in the morning and at bedtime.   montelukast (SINGULAIR) 10 MG tablet TAKE 1 TABLET(10 MG) BY MOUTH AT BEDTIME Strength: 10 mg   olopatadine (PATANOL) 0.1 % ophthalmic solution Place 1 drop into both eyes 2 (two) times daily.   Potassium Chloride ER 20 MEQ TBCR TAKE 1 TABLET(20  MEQ) BY MOUTH TWICE DAILY   promethazine-dextromethorphan (PROMETHAZINE-DM) 6.25-15 MG/5ML syrup Take 5 mLs by mouth 4 (four) times daily as needed.   spironolactone (ALDACTONE) 25 MG tablet Take 12.5 mg by mouth daily.   Vitamin D, Ergocalciferol, (DRISDOL) 1.25 MG (50000 UNIT) CAPS capsule Take 1 capsule (50,000 Units total) by mouth every Monday.   warfarin (COUMADIN) 2.5 MG tablet Take 1 tablet by mouth daily except 1.5 tablets on Wednesdays and Saturdays or as directed by Anticoagulation Clinic.     Allergies:   Lisinopril and Losartan   Social History   Socioeconomic History   Marital status: Unknown    Spouse name: Not on file   Number of children: Not on file   Years of education: Not on file   Highest education level: Not on file  Occupational History   Not on file  Tobacco Use   Smoking status: Never   Smokeless tobacco: Never  Vaping Use   Vaping Use: Never used  Substance and Sexual Activity   Alcohol use: Never   Drug use: Never   Sexual activity: Not on file  Other Topics Concern   Not on file  Social History Narrative   Not on file   Social Determinants of Health   Financial Resource Strain: Medium Risk (02/21/2022)   Overall Financial Resource Strain (CARDIA)    Difficulty of Paying Living Expenses: Somewhat hard  Food Insecurity: Food Insecurity Present (09/25/2022)   Hunger Vital Sign    Worried About Running Out of Food in the Last Year: Sometimes true    Ran Out of Food in the Last Year: Sometimes true  Transportation Needs: No Transportation Needs (02/21/2022)   PRAPARE - Hydrologist (Medical): No    Lack of Transportation (Non-Medical): No  Physical Activity: Sufficiently Active (02/21/2022)   Exercise Vital Sign    Days of Exercise per Week: 7 days    Minutes of Exercise per Session: 30 min  Stress: No Stress Concern Present (02/21/2022)   [place]     Feeling of Stress : Not at all  Social Connections: Moderately Isolated (02/21/2022)   Social Connection and Isolation Panel [NHANES]    Frequency of Communication with Friends and Family: Once a week    Frequency of Social Gatherings with Friends and Family: Three times a week    Attends Religious Services: Never    Active Member of Clubs or Organizations: No    Attends Archivist Meetings: Never    Marital Status: Married     Family History: The patient's family history includes CAD in her mother; Cancer in her brother; Colon cancer in her father; Heart failure in her mother; Hypertension in her mother. ROS:   Please see the history of present illness.    All 14 point review of systems negative except as described per history of present illness  EKGs/Labs/Other Studies Reviewed:      Recent Labs: 11/06/2021: TSH 1.430 04/16/2022: NT-Pro BNP 8,412 09/01/2022: ALT 7; BUN 29; Creatinine, Ser 1.37; Hemoglobin 14.4; Platelets 222; Potassium 3.9; Sodium 137  Recent Lipid Panel    Component Value Date/Time   CHOL 124 11/06/2021 1151   TRIG 55 11/06/2021 1151   HDL 33 (L) 11/06/2021 1151   CHOLHDL 3.8 11/06/2021 1151   CHOLHDL 6.4 05/20/2019 0441   VLDL 18 05/20/2019 0441   LDLCALC 79 11/06/2021 1151    Physical Exam:    VS:  BP (!) 70/50 (BP Location: Left Arm, Patient Position: Sitting, Cuff Size: Small)   Pulse 85   Ht '5\' 8"'$  (1.727 m)   Wt 149 lb 9.6 oz (67.9 kg)   SpO2 (!) 87%   BMI 22.75 kg/m     Wt Readings from Last 3 Encounters:  10/28/22 149 lb 9.6 oz (67.9 kg)  09/01/22 167 lb (75.8 kg)  04/16/22 157 lb 9.6 oz (71.5 kg)     GEN:  Well nourished, well developed in no acute distress HEENT: Normal NECK: No JVD; No carotid bruits LYMPHATICS: No lymphadenopathy CARDIAC: Irregularly irregular, no murmurs, no rubs, no gallops RESPIRATORY:  Clear to auscultation without rales, wheezing or rhonchi  ABDOMEN: Soft, non-tender, non-distended MUSCULOSKELETAL:   1-2 pitting edema; No deformity  SKIN: Warm and dry LOWER EXTREMITIES: 1-2+ swelling NEUROLOGIC:  Alert and oriented x 3 PSYCHIATRIC:  Normal affect   ASSESSMENT:    1. Chronic systolic CHF (congestive heart failure) ([place])   2. LBBB (  left bundle branch block)   3. Nonrheumatic mitral valve regurgitation   4. Type 2 diabetes mellitus with hyperglycemia, without long-term current use of insulin (HCC)   5. History of CVA (cerebrovascular accident)   6. Mixed hyperlipidemia    PLAN:    In order of problems listed above:  Chronic systolic congestive heart failure.  Severely reduced left ventricle ejection fraction, blood pressure very low, very limited options in terms of medical management.  Previously she refused any aggressive intervention previously she did not want to have ICD BiV pacing.  She does have very wide QRS complex already so she could potentially benefit from BiV pacing.  However, she always refused. Left bundle branch block which is chronic. Nonrheumatic mitral valve regurgitation, echocardiogram will be repeated I had a long discussion with her as well with her family.  Because of hypotension hypoxia I think she will be best served by being admitted to the hospital.  However overall hide he understand and they understand as well that options are very limited..   Medication Adjustments/Labs and Tests Ordered: Current medicines are reviewed at length with the patient today.  Concerns regarding medicines are outlined above.  No orders of the defined types were placed in this encounter.  Medication changes: No orders of the defined types were placed in this encounter.   Signed, Park Liter, MD, [facility] 10/28/2022 11:00 AM    [place]

## 2022-10-29 DIAGNOSIS — I342 Nonrheumatic mitral (valve) stenosis: Secondary | ICD-10-CM

## 2022-10-29 DIAGNOSIS — I361 Nonrheumatic tricuspid (valve) insufficiency: Secondary | ICD-10-CM | POA: Diagnosis not present

## 2022-10-30 DIAGNOSIS — Z7901 Long term (current) use of anticoagulants: Secondary | ICD-10-CM

## 2022-10-30 DIAGNOSIS — I420 Dilated cardiomyopathy: Secondary | ICD-10-CM

## 2022-10-30 DIAGNOSIS — I447 Left bundle-branch block, unspecified: Secondary | ICD-10-CM

## 2022-10-31 DIAGNOSIS — I420 Dilated cardiomyopathy: Secondary | ICD-10-CM | POA: Diagnosis not present

## 2022-10-31 DIAGNOSIS — Z7901 Long term (current) use of anticoagulants: Secondary | ICD-10-CM | POA: Diagnosis not present

## 2022-10-31 DIAGNOSIS — I447 Left bundle-branch block, unspecified: Secondary | ICD-10-CM | POA: Diagnosis not present

## 2022-11-01 DIAGNOSIS — I420 Dilated cardiomyopathy: Secondary | ICD-10-CM

## 2022-11-01 DIAGNOSIS — I447 Left bundle-branch block, unspecified: Secondary | ICD-10-CM | POA: Diagnosis not present

## 2022-11-01 DIAGNOSIS — R609 Edema, unspecified: Secondary | ICD-10-CM

## 2022-11-14 ENCOUNTER — Telehealth: Payer: Self-pay | Admitting: Cardiology

## 2022-11-14 NOTE — Telephone Encounter (Signed)
Pt's daughter called in to let Dr. Agustin Cree know that the pt passed away yesterday.

## 2022-11-24 DEATH — deceased

## 2023-02-04 NOTE — Progress Notes (Signed)
error
